# Patient Record
Sex: Female | Born: 1950
Health system: Southern US, Community
[De-identification: ages and names within clinical notes are randomized; demographics above are authoritative.]

## PROBLEM LIST (undated history)

## (undated) DIAGNOSIS — E079 Disorder of thyroid, unspecified: Secondary | ICD-10-CM

## (undated) DIAGNOSIS — I10 Essential (primary) hypertension: Secondary | ICD-10-CM

## (undated) DIAGNOSIS — R0600 Dyspnea, unspecified: Secondary | ICD-10-CM

## (undated) DIAGNOSIS — G5602 Carpal tunnel syndrome, left upper limb: Secondary | ICD-10-CM

## (undated) DIAGNOSIS — J40 Bronchitis, not specified as acute or chronic: Secondary | ICD-10-CM

## (undated) DIAGNOSIS — E785 Hyperlipidemia, unspecified: Secondary | ICD-10-CM

## (undated) DIAGNOSIS — IMO0002 Reserved for concepts with insufficient information to code with codable children: Secondary | ICD-10-CM

## (undated) DIAGNOSIS — D649 Anemia, unspecified: Secondary | ICD-10-CM

## (undated) DIAGNOSIS — J45909 Unspecified asthma, uncomplicated: Secondary | ICD-10-CM

## (undated) DIAGNOSIS — R0609 Other forms of dyspnea: Secondary | ICD-10-CM

## (undated) DIAGNOSIS — E119 Type 2 diabetes mellitus without complications: Secondary | ICD-10-CM

## (undated) DIAGNOSIS — R51 Headache: Secondary | ICD-10-CM

## (undated) DIAGNOSIS — N390 Urinary tract infection, site not specified: Secondary | ICD-10-CM

## (undated) DIAGNOSIS — F32A Depression, unspecified: Secondary | ICD-10-CM

## (undated) DIAGNOSIS — R519 Headache, unspecified: Secondary | ICD-10-CM

## (undated) DIAGNOSIS — R1110 Vomiting, unspecified: Secondary | ICD-10-CM

## (undated) DIAGNOSIS — Z9889 Other specified postprocedural states: Secondary | ICD-10-CM

## (undated) DIAGNOSIS — J189 Pneumonia, unspecified organism: Secondary | ICD-10-CM

## (undated) DIAGNOSIS — R112 Nausea with vomiting, unspecified: Secondary | ICD-10-CM

## (undated) DIAGNOSIS — I209 Angina pectoris, unspecified: Secondary | ICD-10-CM

## (undated) DIAGNOSIS — M1990 Unspecified osteoarthritis, unspecified site: Secondary | ICD-10-CM

## (undated) DIAGNOSIS — F329 Major depressive disorder, single episode, unspecified: Secondary | ICD-10-CM

## (undated) HISTORY — PX: ESOPHAGOGASTRODUODENOSCOPY: SHX1529

## (undated) HISTORY — DX: Major depressive disorder, single episode, unspecified: F32.9

## (undated) HISTORY — PX: TOTAL ELBOW REPLACEMENT: SUR1214

## (undated) HISTORY — DX: Essential (primary) hypertension: I10

## (undated) HISTORY — DX: Disorder of thyroid, unspecified: E07.9

## (undated) HISTORY — DX: Depression, unspecified: F32.A

## (undated) HISTORY — DX: Unspecified osteoarthritis, unspecified site: M19.90

---
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
## 1999-08-25 ENCOUNTER — Emergency Department (HOSPITAL_COMMUNITY): Admission: EM | Admit: 1999-08-25 | Discharge: 1999-08-25 | Payer: Self-pay | Admitting: Emergency Medicine

## 2000-11-15 ENCOUNTER — Emergency Department (HOSPITAL_COMMUNITY): Admission: EM | Admit: 2000-11-15 | Discharge: 2000-11-15 | Payer: Self-pay | Admitting: Emergency Medicine

## 2000-11-15 ENCOUNTER — Encounter: Payer: Self-pay | Admitting: Emergency Medicine

## 2001-12-16 ENCOUNTER — Encounter: Payer: Self-pay | Admitting: Emergency Medicine

## 2001-12-16 ENCOUNTER — Emergency Department (HOSPITAL_COMMUNITY): Admission: EM | Admit: 2001-12-16 | Discharge: 2001-12-16 | Payer: Self-pay | Admitting: Emergency Medicine

## 2004-07-16 ENCOUNTER — Emergency Department (HOSPITAL_COMMUNITY): Admission: EM | Admit: 2004-07-16 | Discharge: 2004-07-16 | Payer: Self-pay | Admitting: Emergency Medicine

## 2004-11-14 ENCOUNTER — Emergency Department (HOSPITAL_COMMUNITY): Admission: EM | Admit: 2004-11-14 | Discharge: 2004-11-14 | Payer: Self-pay | Admitting: Emergency Medicine

## 2004-11-22 ENCOUNTER — Inpatient Hospital Stay (HOSPITAL_COMMUNITY): Admission: RE | Admit: 2004-11-22 | Discharge: 2004-11-23 | Payer: Self-pay | Admitting: Orthopedic Surgery

## 2007-09-08 ENCOUNTER — Emergency Department (HOSPITAL_COMMUNITY): Admission: EM | Admit: 2007-09-08 | Discharge: 2007-09-08 | Payer: Self-pay | Admitting: Emergency Medicine

## 2008-02-02 ENCOUNTER — Emergency Department (HOSPITAL_COMMUNITY): Admission: EM | Admit: 2008-02-02 | Discharge: 2008-02-02 | Payer: Self-pay | Admitting: Emergency Medicine

## 2008-02-09 ENCOUNTER — Emergency Department (HOSPITAL_COMMUNITY): Admission: EM | Admit: 2008-02-09 | Discharge: 2008-02-09 | Payer: Self-pay | Admitting: Emergency Medicine

## 2008-02-20 ENCOUNTER — Emergency Department (HOSPITAL_COMMUNITY): Admission: EM | Admit: 2008-02-20 | Discharge: 2008-02-20 | Payer: Self-pay | Admitting: Emergency Medicine

## 2010-09-18 ENCOUNTER — Ambulatory Visit: Payer: Self-pay | Admitting: Internal Medicine

## 2010-09-18 DIAGNOSIS — I10 Essential (primary) hypertension: Secondary | ICD-10-CM | POA: Insufficient documentation

## 2010-09-18 DIAGNOSIS — R059 Cough, unspecified: Secondary | ICD-10-CM | POA: Insufficient documentation

## 2010-09-18 DIAGNOSIS — R0602 Shortness of breath: Secondary | ICD-10-CM | POA: Insufficient documentation

## 2010-09-18 DIAGNOSIS — R05 Cough: Secondary | ICD-10-CM

## 2010-10-15 ENCOUNTER — Emergency Department (HOSPITAL_COMMUNITY)
Admission: EM | Admit: 2010-10-15 | Discharge: 2010-10-15 | Payer: Self-pay | Source: Home / Self Care | Admitting: Emergency Medicine

## 2010-11-07 ENCOUNTER — Telehealth: Payer: Self-pay | Admitting: Internal Medicine

## 2010-11-12 NOTE — Assessment & Plan Note (Signed)
Summary: Pulmonary consulation/ cough ? all ace > d/c   Visit Type:  Initial Consult Copy to:  Dr. Jacinto Reap Primary Provider/Referring Provider:  none  CC:  Cough.  History of Present Illness: 29 yowf quit smoking  06/2010 with onset of cough.  September 18, 2010  1st pulmonary office eval on ACEI  cc cough x 3 months indolent onset, persistent and tickle in throat but no excess mucus worse after lie down x wake up using.  no better on inhalers including or prednisone. cough on insp.  no overt hb or sinus c/o's.  only sob when coughing.  Pt denies any significant sore throat, dysphagia, itching, sneezing,  nasal congestion or excess secretions,  fever, chills, sweats, unintended wt loss, pleuritic or exertional cp, hempoptysis, change in activity tolerance  orthopnea pnd or leg swelling Pt also denies any obvious fluctuation in symptoms with weather or environmental change or other alleviating or aggravating factors.       Current Medications (verified): 1)  Spiriva Handihaler 18 Mcg Caps (Tiotropium Bromide Monohydrate) .... Inhale Contents of 1 Capsule Daily 2)  Proair Hfa 108 (90 Base) Mcg/act Aers (Albuterol Sulfate) .... 2 Puffs Every 4-6 Hrs As Needed 3)  Vitamin D 400 Unit Caps (Cholecalciferol) .Marland Kitchen.. 1 Once Daily 4)  Vitamin B-12 1000 Mcg Tabs (Cyanocobalamin) .Marland Kitchen.. 1 Once Daily 5)  St Johns Wort 300 Mg Caps ([address] Johns Wort) .Marland Kitchen.. 1 Two Times A Day 6)  Lisinopril 5 Mg Tabs (Lisinopril) .Marland Kitchen.. 1 Once Daily  Allergies (verified): No Known Drug Allergies  Past History:  Past Medical History: Hypertension     - ACE  d/c September 18, 2010  due cough Cough..............................................................Marland KitchenWert  Past Surgical History: Cholecystectomy Hysterectomy 1980 Tubal ligation 1980  Family History: DM- Both Parents Negative for respiratory diseases or atopy   Social History: Married Chief of Staff Former smoker.  Quit in Sept 2011. Smoked 1/2 ppd x  40 yrs. No ETOH  Review of Systems       The patient complains of shortness of breath with activity, non-productive cough, loss of appetite, headaches, and anxiety.  The patient denies shortness of breath at rest, productive cough, coughing up blood, chest pain, irregular heartbeats, acid heartburn, indigestion, weight change, abdominal pain, difficulty swallowing, sore throat, tooth/dental problems, nasal congestion/difficulty breathing through nose, sneezing, itching, ear ache, depression, hand/feet swelling, joint stiffness or pain, rash, change in color of mucus, and fever.    Vital Signs:  Patient profile:   60 year old female Height:      64 inches Weight:      234.25 pounds BMI:     40.35 O2 Sat:      94 % on Room air Temp:     97.9 degrees F oral Pulse rate:   79 / minute BP sitting:   112 / 84  (left arm) Cuff size:   large  Vitals Entered By: Vernie Murders (September 18, 2010 1:59 PM)  O2 Flow:  Room air  Physical Exam  Additional Exam:  obese anxious wf nad wt 234 September 18, 2010 Voice fatigue, cough on early insp. HEENT: nl dentition, turbinates, and orophanx. Nl external ear canals without cough reflex NECK :  without JVD/Nodes/TM/ nl carotid upstrokes bilaterally LUNGS: no acc muscle use, clear to A and P bilaterally   CV:  RRR  no s3 or murmur or increase in P2, no edema  HYQ:MVHQI but   soft and nontender with nl excursion in the  supine position. No bruits or organomegaly, bowel sounds nl MS:  warm without deformities, calf tenderness, cyanosis or clubbing SKIN: warm and dry without lesions   NEURO:  alert, approp, no deficits, nl gait     CXR  Procedure date:  09/18/2010  Findings:      Findings: Cardiopericardial silhouette is within normal limits. Lungs are clear.  No airspace disease.  No effusion.   IMPRESSION: No active cardiopulmonary disease.  Impression & Recommendations:  Problem # 1:  COUGH (ICD-786.2) The most common causes of chronic  cough in immunocompetent adults include: upper airway cough syndrome (UACS), previously referred to as postnasal drip syndrome,  caused by variety of rhinosinus conditions; (2) asthma; (3) GERD; (4) chronic bronchitis from cigarette smoking or other inhaled environmental irritants; (5) nonasthmatic eosinophilic bronchitis; and (6) bronchiectasis. These conditions, singly or in combination, have accounted for up to 94% of the causes of chronic cough in prospective studies.  this is most c/w  Classic Upper airway cough syndrome, so named because it's frequently impossible to sort out how much is  CR/sinusitis with freq throat clearing (which can be related to primary GERD)   vs  causing  secondary extra esophageal GERD from wide swings in gastric pressure that occur with throat clearing, promoting self use of mint and menthol lozenges that reduce the lower esophageal sphincter tone and exacerbate the problem further These are the same pts who not infrequently have failed to tolerate ace inhibitors,  dry powder inhalers or biphosphonates or report having reflux symptoms that don't respond to standard doses of PPI  She is on ace so try off these first and diet for gerd then regroup in office 6 weeks with pfts  Problem # 2:  HYPERTENSION (ICD-401.9)  The following medications were removed from the medication list:    Lisinopril 5 Mg Tabs (Lisinopril) .Marland Kitchen... 1 once daily Her updated medication list for this problem includes:    Micardis 80 Mg Tabs (Telmisartan) ..... One half daily    ACE inhibitors are problematic in  pts with airway complaints because  even experienced pulmonologists can't always distinguish ace effects from copd/asthma.  By themselves they don't actually cause a problem, much like oxygen can't by itself start a fire, but they certainly serve as a powerful catalyst or enhancer for any "fire"  or inflammatory process in the upper airway, be it caused by an ET  tube or more commonly reflux  (especially in the obese or pts with known GERD or who are on biphoshonates).  In the era of ARB near equivalency until we have a better handle on the reversibility of the airway problem, it just makes sense to avoid ace entirely in the short run and then decide later, having established a level of airway control using a reasonable limited regimen, whether to add back ace but even then being very careful to observe the pt for worsening airway control and number of meds used/ needed to control symptoms.    Medications Added to Medication List This Visit: 1)  Spiriva Handihaler 18 Mcg Caps (Tiotropium bromide monohydrate) .... Inhale contents of 1 capsule daily 2)  Proair Hfa 108 (90 Base) Mcg/act Aers (Albuterol sulfate) .... 2 puffs every 4-6 hrs as needed 3)  Vitamin D 400 Unit Caps (Cholecalciferol) .Marland Kitchen.. 1 once daily 4)  Vitamin B-12 1000 Mcg Tabs (Cyanocobalamin) .Marland Kitchen.. 1 once daily 5)  St Johns Wort 300 Mg Caps ([address] johns wort) .Marland Kitchen.. 1 two times a day 6)  Lisinopril  5 Mg Tabs (Lisinopril) .Marland Kitchen.. 1 once daily 7)  Micardis 80 Mg Tabs (Telmisartan) .... One half daily  Other Orders: Consultation Level V (99245) T-2 View CXR (71020TC)  Patient Instructions: 1)  stop lisnopril 2)  start micardis 80 one half daily 3)  Please schedule a follow-up appointment in 6 weeks, sooner if needed with pft's 4)  GERD (REFLUX)  is a common cause of respiratory symptoms. It commonly presents without heartburn and can be treated with medication, but also with lifestyle changes including avoidance of late meals, excessive alcohol, smoking cessation, and avoid fatty foods, chocolate, peppermint, colas, red wine, and acidic juices such as orange juice. NO MINT OR MENTHOL PRODUCTS SO NO COUGH DROPS  5)  USE SUGARLESS CANDY INSTEAD (jolley ranchers)  6)  NO OIL BASED VITAMINS

## 2010-11-14 NOTE — Progress Notes (Signed)
Summary: micardis samples  Phone Note Call from Patient Call back at Home Phone [phone]   Caller: Patient Call For: Jerry Clyne Summary of Call: pt took her last micardis tab. needs enough samples until rov w/ Martin Smeal 12/03/10.  Initial call taken by: Tivis Ringer, CNA,  November 07, 2010 3:41 PM  Follow-up for Phone Call        samples left at front desk. pt aware. Zackery Barefoot CMA  November 07, 2010 5:07 PM

## 2010-11-15 ENCOUNTER — Ambulatory Visit: Admit: 2010-11-15 | Payer: Self-pay | Admitting: Internal Medicine

## 2010-11-15 ENCOUNTER — Ambulatory Visit: Payer: Self-pay | Admitting: Internal Medicine

## 2010-12-03 ENCOUNTER — Encounter: Payer: Self-pay | Admitting: Internal Medicine

## 2010-12-03 ENCOUNTER — Encounter (INDEPENDENT_AMBULATORY_CARE_PROVIDER_SITE_OTHER): Payer: BC Managed Care – PPO

## 2010-12-03 ENCOUNTER — Ambulatory Visit: Payer: Self-pay | Admitting: Internal Medicine

## 2010-12-03 ENCOUNTER — Telehealth (INDEPENDENT_AMBULATORY_CARE_PROVIDER_SITE_OTHER): Payer: Self-pay | Admitting: *Deleted

## 2010-12-03 DIAGNOSIS — R0602 Shortness of breath: Secondary | ICD-10-CM

## 2010-12-03 DIAGNOSIS — R059 Cough, unspecified: Secondary | ICD-10-CM

## 2010-12-03 DIAGNOSIS — R05 Cough: Secondary | ICD-10-CM

## 2010-12-05 ENCOUNTER — Ambulatory Visit (INDEPENDENT_AMBULATORY_CARE_PROVIDER_SITE_OTHER): Payer: BC Managed Care – PPO | Admitting: Internal Medicine

## 2010-12-05 ENCOUNTER — Encounter: Payer: Self-pay | Admitting: Internal Medicine

## 2010-12-05 DIAGNOSIS — R635 Abnormal weight gain: Secondary | ICD-10-CM

## 2010-12-05 DIAGNOSIS — R0602 Shortness of breath: Secondary | ICD-10-CM

## 2010-12-05 DIAGNOSIS — R05 Cough: Secondary | ICD-10-CM

## 2010-12-05 DIAGNOSIS — I10 Essential (primary) hypertension: Secondary | ICD-10-CM

## 2010-12-05 DIAGNOSIS — R059 Cough, unspecified: Secondary | ICD-10-CM

## 2010-12-06 ENCOUNTER — Telehealth (INDEPENDENT_AMBULATORY_CARE_PROVIDER_SITE_OTHER): Payer: Self-pay | Admitting: *Deleted

## 2010-12-10 NOTE — Assessment & Plan Note (Signed)
Summary: PFT's wnl x erv 39%    Allergies: No Known Drug Allergies  Past History:  Past Medical History: Hypertension     - ACE  d/c September 18, 2010  due cough Cough..............................................................Marland KitchenWert      - PFT's December 03, 2010  FEV1 2.30 (96%) with ratio 83 and nl DLCO  ERV 39%   Other Orders: Carbon Monoxide diffusing w/capacity (65784) Lung Volumes/Gas dilution or washout (69629) Spirometry (Pre & Post) (52841) No Charge Patient Arrived (NCPA0) (NCPA0)  Appended Document: PFT's wnl x erv 39%  let her know pft's wnl but needs ov if not doing 100% better  Appended Document: PFT's wnl x erv 39%  lmomtcb  Appended Document: PFT's wnl x erv 39%  MW will discuss at ov today

## 2010-12-10 NOTE — Progress Notes (Signed)
Summary: needs refill pt coming in today  Phone Note Call from Patient Call back at [facility] Phone [phone]   Caller: Patient Call For: wert Reason for Call: Refill Medication Summary of Call: Patient has a PFT at 11:00a today then to see dr wert. We had to reschedule dr werts apt due to a family emergency. she will have to call thursday and make another apt with wert. (when she gets her schedule for work) she is out of micardis. she needs a couple pills to last her until she can come in to see him again.  Initial call taken by: Valinda Hoar,  December 03, 2010 8:40 AM  Follow-up for Phone Call        Pt coming at 11:00 today for PFT.  Sample of Micardis left in PFT room for pt to pick up.   Pt aware. Abigail Miyamoto RN  December 03, 2010 9:21 AM

## 2010-12-10 NOTE — Assessment & Plan Note (Signed)
Summary: Pulmonary/ final summary f/u ov   Copy to:  Dr. Jacinto Reap Primary Provider/Referring Provider:  none  CC:  Dyspnea- some improvement.  History of Present Illness: 57 yowf quit smoking  06/2010 with onset of cough.  September 18, 2010  1st pulmonary office eval on ACEI  cc cough x 3 months indolent onset, persistent and tickle in throat but no excess mucus worse after lie down x wake up using.  no better on inhalers including or prednisone. cough on insp.  no overt hb or sinus c/o's.  only sob when coughing. stop lisnopril start micardis 80 one half daily Please schedule a follow-up appointment in 6 weeks, sooner if needed with pft's GERD (REFLUX)  diet    December 05, 2010  ov cough resolved off ace, not really limited by sob but more by knee.   Pt denies any significant sore throat, dysphagia, itching, sneezing,  nasal congestion or excess secretions,  fever, chills, sweats, unintended wt loss, pleuritic or exertional cp, hempoptysis, change in activity tolerance  orthopnea pnd or leg swelling Pt also denies any obvious fluctuation in symptoms with weather or environmental change or other alleviating or aggravating factors.          Current Medications (verified): 1)  Spiriva Handihaler 18 Mcg Caps (Tiotropium Bromide Monohydrate) .... Inhale Contents of 1 Capsule Daily Only Taking As Needed 2)  Proair Hfa 108 (90 Base) Mcg/act Aers (Albuterol Sulfate) .... 2 Puffs Every 4-6 Hrs As Needed 3)  Vitamin D 400 Unit Caps (Cholecalciferol) .Marland Kitchen.. 1 Once Daily 4)  Vitamin B-12 1000 Mcg Tabs (Cyanocobalamin) .Marland Kitchen.. 1 Once Daily 5)  Micardis 80 Mg  Tabs (Telmisartan) .... One Half Daily  Allergies (verified): No Known Drug Allergies  Past History:  Past Medical History: Hypertension     - ACE  d/c September 18, 2010  due to cough > resolved Cough..............................................................Marland KitchenWert      - PFT's December 03, 2010  FEV1 2.30 (96%) with ratio 83 and nl  DLCO  ERV 39%  Vital Signs:  Patient profile:   60 year old female Weight:      234 pounds O2 Sat:      96 % on Room air Temp:     97.4 degrees F oral Pulse rate:   96 / minute BP sitting:   152 / 94  (right arm) Cuff size:   large  Vitals Entered By: Vernie Murders (December 05, 2010 4:36 PM)  O2 Flow:  Room air  Clinical Reports Reviewed:  CXR:  09/18/2010: CXR Results:  Findings: Cardiopericardial silhouette is within normal limits. Lungs are clear.  No airspace disease.  No effusion.   IMPRESSION: No active cardiopulmonary disease.   Physical Exam  Additional Exam:  obese wf having trouble standing and onto exam table due to wt and knee, not  sob wt 234 September 18, 2010 > 234 December 05, 2010  Voice fatigue, cough on early insp. HEENT: nl dentition, turbinates, and orophanx. Nl external ear canals without cough reflex NECK :  without JVD/Nodes/TM/ nl carotid upstrokes bilaterally LUNGS: no acc muscle use, clear to A and P bilaterally   CV:  RRR  no s3 or murmur or increase in P2, no edema  RJJ:OACZY but   soft and nontender with nl excursion in the supine position. No bruits or organomegaly, bowel sounds nl MS:  warm without deformities, calf tenderness, cyanosis or clubbing       Impression & Recommendations:  Problem #  1:  COUGH (ICD-786.2)  Resolved off ace.  ACE inhibitors were  probably fueling this problem because they inhibit the metabolism of Upper Air Bradykinin,  a potent mediator of upper airways inflammation. If they are the "fuel" then something else usually is the "spark" (eg viral uri, post nasal drainage, or reflux). In the era of ARB equivalency, at least in the short run, I recommend the ACE be stopped for a six week trial basis. If the problem resolves, the ACE could still be restarted depending on the severity of the severity of the original problem - in her case she does not want to rechallenge and I'll give her a year's supply of  micardis  Orders: Est. Patient Level IV (16109)  Problem # 2:  HYPERTENSION (ICD-401.9)  not optimal on micardis 40 mg > double dose and f/u by primary Her updated medication list for this problem includes:    Micardis 80 Mg Tabs (Telmisartan) ..... One daily  Orders: Est. Patient Level IV (60454)  Problem # 3:  DYSPNEA (ICD-786.05)  By pft's she has no sign copd and the reduction in ERV is classic for obesity.  No role for spiriva here  Orders: Est. Patient Level IV (09811)  Problem # 4:  WEIGHT GAIN, ABNORMAL (ICD-783.1)  Weight control is a matter of calorie balance which needs to be tilted in the pt's favor by eating less and exercising more.  Specifically, I recommended  exercise at a level where pt  is short of breath but not out of breath 30 minutes daily.  If not losing weight on this program, I would strongly recommend pt see a nutritionist with a food diary recorded for two weeks prior to the visit.     Water aerobics strongly recommended in setting of knee limitations  Orders: Est. Patient Level IV (91478)  Medications Added to Medication List This Visit: 1)  Spiriva Handihaler 18 Mcg Caps (Tiotropium bromide monohydrate) .... Inhale contents of 1 capsule daily only taking as needed 2)  Micardis 80 Mg Tabs (Telmisartan) .... One daily  Patient Instructions: 1)  stop spiriva 2)  increase micardis 80 mg one daily 3)  your lung function is relatively good and will not worsen if you don't  smoke but does the effect of weight on your diaphragm  4)  Copy sent to: [facility] Prescriptions: MICARDIS 80 MG  TABS (TELMISARTAN) one daily  #34 x 11   Entered and Authorized by:   Nyoka Cowden MD   Signed by:   Nyoka Cowden MD on 12/05/2010   Method used:   Electronically to        [facility] [id number] ([facility])       [address]       [place], [place]  [address]       Ph: [phone] or [phone]       Fax: [phone]   RxID:    [id number]

## 2010-12-18 ENCOUNTER — Encounter: Payer: Self-pay | Admitting: Internal Medicine

## 2010-12-19 NOTE — Progress Notes (Addendum)
Summary: Micardis PA initiated---APPROVED  Phone Note Outgoing Call   Call placed by: Michel Bickers CMA,  December 06, 2010 11:50 AM Call placed to: [facility] @ [phone] Summary of Call: PA initiated for Micardis. Per insurance pt must have already tried a generic ACE inhibitor and failed. Pt was previously on Lisinoptil and MW changed this to Micardis due to the pt's cough.  MEMber ID# is [id number].  Awaiting form. Initial call taken by: Michel Bickers CMA,  December 06, 2010 11:54 AM  Follow-up for Phone Call        Form received and placed in MW look at to sign.Michel Bickers [facility]  December 06, 2010 12:03 PM  Form was completed and faxed back to [facility], then placed in triage under awaiting approvals Vernie Murders  December 09, 2010 12:13 PM   Additional Follow-up for Phone Call Additional follow up Details #1::        Rx was approved form 12/10/10 until 10/12/38- sent fax to [facility] [address] to let them know. Additional Follow-up by: Vernie Murders,  December 12, 2010 5:01 PM

## 2010-12-24 NOTE — Medication Information (Signed)
Summary: Tax adviser   Imported By: Valinda Hoar 12/18/2010 13:30:27  _____________________________________________________________________  External Attachment:    Type:   Image     Comment:   External Document

## 2011-02-28 NOTE — Op Note (Signed)
NAMESHERRONDA, SWEIGERT             ACCOUNT NO.:  [id number]   MEDICAL RECORD NO.:  [id number]          PATIENT TYPE:  AMB   LOCATION:  DAY                          FACILITY:  [facility]   PHYSICIAN:  Vania Rea. Supple, M.D.  DATE OF BIRTH:  [birth year]   DATE OF PROCEDURE:  11/21/2004  DATE OF DISCHARGE:                                 OPERATIVE REPORT   PREOPERATIVE DIAGNOSIS:  Complex comminuted displaced right radial head  fracture.   POSTOPERATIVE DIAGNOSES:  Complex comminuted displaced right radial head  fracture.   PROCEDURE:  Radial head excision and prosthetic replacement of the radial  head utilizing a size 2 stem and a 22 short head from the [facility]  orthopedic radial head implant system.   SURGEON OF RECORD:  Vania Rea. Supple, M.D.   Threasa HeadsFrench Ana A. Shuford, P.A.-C.   ANESTHESIA:  General endotracheal.   TOURNIQUET TIME:  37 minutes.   ESTIMATED BLOOD LOSS:  Minimal.   DRAINS:  None.   HISTORY:  Ms. Douglass is a 60 year old female who fell onto the outstretched  right upper extremity last week, with immediate complaints of pain and loss  of elbow motion.  Initial radiographs in the emergency room showed a radial  head fracture.  She was placed into a sling and on followup in the office  was found to have markedly restricted and painful motion of the elbow, with  radiographs reviewed showing complete displacement of the majority of the  radial head into a posterolateral position.  She was found initially to be  grossly neurovascularly intact.  She is subsequently brought to the  operating room at this time for planned repair versus excision and  prosthetic replacement of the radial head.   Preoperative, I had discussed with Ms. Dittman treatment options as well as  risks versus benefits thereof.  Possible surgical complications of bleeding,  infection, neurovascular injury, loss of reduction, and possible need for  additional surgery were reviewed.  She  understands  and accepts and agrees  to the planned procedure.   PROCEDURE IN DETAIL:  After undergoing routine preoperative evaluation, the  patient received prophylactic antibiotics.  She was placed supine on the  operating table and underwent smooth induction of general endotracheal  anesthesia.  Tourniquet applied to the right upper arm.  Right upper  extremity was then sterilely prepped and draped in the standard fashion.  Arm was exsanguinated, with the tourniquet inflated to 250 mmHg.  We made a  lateral Kocher approach to the right elbow through a 6 cm longitudinal  incision overlying the lateral aspect of the elbow.  Skin flaps were  mobilized.  I identified the anconeus and ECU and made a division in this  interval.  We did split the fibers of the anconeus to appropriately  visualize the radiocapitellar joint, as well as the displaced radial head  fragment which was posterolateral in the elbow.  The radial head fragment  had completely extruded from the radiocapitellar joint and was essentially  beneath the anconeus.  This fragment was removed in its entirety, and it  constituted approximately 80% of  the radial head.  It was taken to the back  table and measured and had a very close approximation with a 22 mm head.  We  then divided the lateral capsule to gain access to the radiocapitellar joint  and carried this division distally to the level of the annular ligaments so  that we could expose the proximal radius.  Retractors were placed anterior  and posterior about the radial neck in a subperiosteal fashion.  We used the  sizing guide to resect the radial neck at the appropriate level for a  standard implant.  This was performed with an oscillating saw.  We then  removed additional bone fragments from the joint.  The joint was copiously  irrigated.  There was noted to be some chondromalacia on the capitellum.  Canal finder was passed into the radial canal, and then we used a  size 1  broach which was well seated and somewhat loose, and so we proceeded with a  size 2 broach, and this had an excellent fill of the proximal radius.  A  trial reduction was performed with the standard 22 mm head, and this did  show good soft tissue balance, so we selected the size 2 broach with the 22  mm head.  The wound was copiously irrigated, and all residual bony debris  was removed.  The size 2 stem was then impacted down into the radial shaft  and obtained an excellent fit.  The 22 mm head was then tamped over the stem  and impacted into position.  The radial head was then reduced.  The elbow  was taken through a range of motion, showing excellent stability and full  motion.  At this point, intraoperative radiographs were obtained which  confirmed congruent alignment of the radiocapitellar joint and showed the  implant to be in good position.  At this time, the wound was then finally  irrigated.  The tourniquet was let down.  Hemostasis was obtained.  We  closed the capsule with interrupted figure-of-eight 0 Vicryl sutures.  2-0  Vicryl was used to close the subcutaneous, and intracuticular 3-0 Monocryl  was used to close the skin, followed by Steri-Strips.  A bulky dry dressing  was applied.  The patient was then extubated and taken to the recovery room  in stable condition.      KMS/MEDQ  D:  11/21/2004  T:  11/21/2004  Job:  [id number]

## 2011-06-10 ENCOUNTER — Emergency Department (HOSPITAL_COMMUNITY)
Admission: EM | Admit: 2011-06-10 | Discharge: 2011-06-10 | Disposition: A | Discharge status: Home / Self Care | Payer: BC Managed Care – PPO | Attending: Emergency Medicine | Admitting: Emergency Medicine

## 2011-06-10 ENCOUNTER — Emergency Department (HOSPITAL_COMMUNITY): Payer: BC Managed Care – PPO

## 2011-06-10 DIAGNOSIS — R3589 Other polyuria: Secondary | ICD-10-CM | POA: Insufficient documentation

## 2011-06-10 DIAGNOSIS — R631 Polydipsia: Secondary | ICD-10-CM | POA: Insufficient documentation

## 2011-06-10 DIAGNOSIS — I10 Essential (primary) hypertension: Secondary | ICD-10-CM | POA: Insufficient documentation

## 2011-06-10 DIAGNOSIS — Z79899 Other long term (current) drug therapy: Secondary | ICD-10-CM | POA: Insufficient documentation

## 2011-06-10 DIAGNOSIS — J4489 Other specified chronic obstructive pulmonary disease: Secondary | ICD-10-CM | POA: Insufficient documentation

## 2011-06-10 DIAGNOSIS — M79609 Pain in unspecified limb: Secondary | ICD-10-CM | POA: Insufficient documentation

## 2011-06-10 DIAGNOSIS — J449 Chronic obstructive pulmonary disease, unspecified: Secondary | ICD-10-CM | POA: Insufficient documentation

## 2011-06-10 DIAGNOSIS — R632 Polyphagia: Secondary | ICD-10-CM | POA: Insufficient documentation

## 2011-06-10 DIAGNOSIS — R7309 Other abnormal glucose: Secondary | ICD-10-CM | POA: Insufficient documentation

## 2011-06-10 DIAGNOSIS — R358 Other polyuria: Secondary | ICD-10-CM | POA: Insufficient documentation

## 2011-06-10 LAB — COMPREHENSIVE METABOLIC PANEL
ALT: 35 U/L (ref 0–35)
AST: 46 U/L — ABNORMAL HIGH (ref 0–37)
Albumin: 3.7 g/dL (ref 3.5–5.2)
Alkaline Phosphatase: 116 U/L (ref 39–117)
BUN: 15 mg/dL (ref 6–23)
CO2: 27 mEq/L (ref 19–32)
Calcium: 9.4 mg/dL (ref 8.4–10.5)
Chloride: 105 mEq/L (ref 96–112)
Creatinine, Ser: 0.48 mg/dL — ABNORMAL LOW (ref 0.50–1.10)
GFR calc Af Amer: >60 mL/min (ref >60)
GFR calc non Af Amer: >60 mL/min (ref >60)
Glucose, Bld: 296 mg/dL — ABNORMAL HIGH (ref 70–99)
Potassium: 4.1 mEq/L (ref 3.5–5.1)
Sodium: 139 mEq/L (ref 135–145)
Total Bilirubin: 0.5 mg/dL (ref 0.3–1.2)
Total Protein: 6.7 g/dL (ref 6.0–8.3)

## 2011-06-10 LAB — DIFFERENTIAL
Basophils Absolute: 0 10*3/uL (ref 0.0–0.1)
Basophils Relative: 0 % (ref 0–1)
Eosinophils Absolute: 0.2 10*3/uL (ref 0.0–0.7)
Eosinophils Relative: 2 % (ref 0–5)
Lymphocytes Relative: 42 % (ref 12–46)
Lymphs Abs: 3.2 10*3/uL (ref 0.7–4.0)
Monocytes Absolute: 0.5 10*3/uL (ref 0.1–1.0)
Monocytes Relative: 6 % (ref 3–12)
Neutro Abs: 3.7 10*3/uL (ref 1.7–7.7)
Neutrophils Relative %: 49 % (ref 43–77)

## 2011-06-10 LAB — URINALYSIS, ROUTINE W REFLEX MICROSCOPIC
Bilirubin Urine: NEGATIVE
Glucose, UA: >1000 mg/dL — AB
Hgb urine dipstick: NEGATIVE
Ketones, ur: 15 mg/dL — AB
Nitrite: NEGATIVE
Protein, ur: NEGATIVE mg/dL
Specific Gravity, Urine: 1.02 (ref 1.005–1.030)
Urobilinogen, UA: 0.2 mg/dL (ref 0.0–1.0)
pH: 5.5 (ref 5.0–8.0)

## 2011-06-10 LAB — CBC
HCT: 44.7 % (ref 36.0–46.0)
Hemoglobin: 15.5 g/dL — ABNORMAL HIGH (ref 12.0–15.0)
MCH: 32 pg (ref 26.0–34.0)
MCHC: 34.7 g/dL (ref 30.0–36.0)
MCV: 92.2 fL (ref 78.0–100.0)
Platelets: 157 10*3/uL (ref 150–400)
RBC: 4.85 MIL/uL (ref 3.87–5.11)
RDW: 12.5 % (ref 11.5–15.5)
WBC: 7.6 10*3/uL (ref 4.0–10.5)

## 2011-06-10 LAB — GLUCOSE, CAPILLARY: Glucose-Capillary: 276 mg/dL — ABNORMAL HIGH (ref 70–99)

## 2011-06-10 LAB — URINE MICROSCOPIC-ADD ON

## 2011-06-19 ENCOUNTER — Encounter: Payer: Self-pay | Admitting: Internal Medicine

## 2011-06-19 ENCOUNTER — Ambulatory Visit (INDEPENDENT_AMBULATORY_CARE_PROVIDER_SITE_OTHER): Payer: BC Managed Care – PPO | Admitting: Internal Medicine

## 2011-06-19 DIAGNOSIS — M1711 Unilateral primary osteoarthritis, right knee: Secondary | ICD-10-CM

## 2011-06-19 DIAGNOSIS — M1712 Unilateral primary osteoarthritis, left knee: Secondary | ICD-10-CM | POA: Insufficient documentation

## 2011-06-19 DIAGNOSIS — R635 Abnormal weight gain: Secondary | ICD-10-CM

## 2011-06-19 DIAGNOSIS — E119 Type 2 diabetes mellitus without complications: Secondary | ICD-10-CM

## 2011-06-19 DIAGNOSIS — I10 Essential (primary) hypertension: Secondary | ICD-10-CM

## 2011-06-19 DIAGNOSIS — IMO0002 Reserved for concepts with insufficient information to code with codable children: Secondary | ICD-10-CM

## 2011-06-19 DIAGNOSIS — M1710 Unilateral primary osteoarthritis, unspecified knee: Secondary | ICD-10-CM

## 2011-06-19 MED ORDER — GLYBURIDE 5 MG PO TABS: 5.0000 mg | ORAL_TABLET | Freq: Every day | ORAL | Status: DC

## 2011-06-19 MED ORDER — METFORMIN HCL 1000 MG PO TABS: 1000.0000 mg | ORAL_TABLET | Freq: Two times a day (BID) | ORAL | Status: DC

## 2011-06-19 NOTE — Patient Instructions (Signed)
Return at your convenience next week for a fasting lipid panel and hemoglobin A1c Return in one month for follow-up  You need to lose weight.  Consider a lower calorie diet and regular exercise.  Limit your sodium (Salt) intake

## 2011-06-19 NOTE — Progress Notes (Signed)
Subjective:    Patient ID: Abigail Collins, female    DOB: Jan 22, 1951, 60 y.o.   MRN: [id number]  HPI  60 year old patient who is seen today to establish with our practice. She has a prior history of hypertension presently not requiring treatment. Her husband has diabetes and a home glucometer and she has noted her blood sugars are increasing over the past several weeks. She went to the ED 2 weeks ago and now is on metformin therapy. A random blood sugar today 171. She has a history of exogenous obesity Other medical problems include osteoarthritis especially affecting the right knee it is then recommended that she have total knee replacement surgery. 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 Quit in Sept 2011. Smoked 1/2 ppd x 40 yrs.  No ETOH    Review of Systems  Constitutional: Positive for unexpected weight change. Negative for fever, appetite change and fatigue.  HENT: Negative for hearing loss, ear pain, nosebleeds, congestion, sore throat, mouth sores, trouble swallowing, neck stiffness, dental problem, voice change, sinus pressure and tinnitus.        Mild blurred vision  Eyes: Negative for photophobia, pain, redness and visual disturbance.  Respiratory: Negative for cough, chest tightness and shortness of breath.   Cardiovascular: Negative for chest pain, palpitations and leg swelling.  Gastrointestinal:  Negative for nausea, vomiting, abdominal pain, diarrhea, constipation, blood in stool, abdominal distention and rectal pain.  Genitourinary: Negative for dysuria, urgency, frequency, hematuria, flank pain, vaginal bleeding, vaginal discharge, difficulty urinating, genital sores, vaginal pain, menstrual problem and pelvic pain.  Musculoskeletal: Positive for joint swelling and gait problem. Negative for back pain and arthralgias.  Skin: Negative for rash.  Neurological: Negative for dizziness, syncope, speech difficulty, weakness, light-headedness, numbness and headaches.  Hematological: Negative for adenopathy. Does not bruise/bleed easily.  Psychiatric/Behavioral: Negative for suicidal ideas, behavioral problems, self-injury, dysphoric mood and agitation. The patient is not nervous/anxious.        Objective:   Physical Exam  Constitutional: She is oriented to person, place, and time. She appears well-developed and well-nourished.       Obese. Blood pressure 110/80. Blood pressure by my exam 140/82  HENT:  Head: Normocephalic and atraumatic.  Right Ear: External ear normal.  Left Ear: External ear normal.  Mouth/Throat: Oropharynx is clear and moist.  Eyes: Conjunctivae and EOM are normal.  Neck: Normal range of motion. Neck supple. No JVD present. No thyromegaly present.  Cardiovascular: Normal rate, regular rhythm, normal heart sounds and intact distal pulses.   No murmur heard. Pulmonary/Chest: Effort normal and breath sounds normal. She has no wheezes. She has no rales.  Abdominal: Soft. Bowel sounds are normal. She exhibits no distension and no mass. There is no tenderness. There is no rebound and no guarding.  Genitourinary: Vagina normal.  Musculoskeletal: Normal range of motion. She exhibits no edema and no tenderness.       Prominent lower extremity varicosities right greater than the  left  Neurological: She is alert and oriented to person, place, and time. She has normal  reflexes. No cranial nerve deficit. She exhibits normal muscle tone. Coordination normal.  Skin: Skin is warm and dry. No rash noted.  Psychiatric: She has a normal mood and affect. Her behavior is normal.          Assessment & Plan:   Diabetes mellitus. The patient will be given considerable information concerning self treatment of diabetes. Her metformin will be increased to 1000 mg twice daily;  she does have a prescription for glyburide which she was encouraged to take once daily. Lifestyle issues discussed at length History of hypertension. We'll continue to observe off medication at this time. History of ACE associated cough Exogenous obesity Osteo arthritis right knee

## 2011-06-23 ENCOUNTER — Telehealth: Payer: Self-pay | Admitting: Internal Medicine

## 2011-06-23 NOTE — Telephone Encounter (Signed)
yes

## 2011-06-23 NOTE — Telephone Encounter (Signed)
Pt was seen on 9-6 as new pt. Pt would like to have total chole/triglycerides and glucose test. Can  I sch?

## 2011-06-24 NOTE — Telephone Encounter (Signed)
Pt is sch for fasting labs on 06-27-2011

## 2011-06-27 ENCOUNTER — Other Ambulatory Visit (INDEPENDENT_AMBULATORY_CARE_PROVIDER_SITE_OTHER): Payer: BC Managed Care – PPO

## 2011-06-27 DIAGNOSIS — E785 Hyperlipidemia, unspecified: Secondary | ICD-10-CM

## 2011-06-27 DIAGNOSIS — R7309 Other abnormal glucose: Secondary | ICD-10-CM

## 2011-06-27 LAB — POCT CBG (FASTING - GLUCOSE)-MANUAL ENTRY: Glucose Fasting, POC: 148 mg/dL

## 2011-06-27 LAB — LIPID PANEL
Cholesterol: 148 mg/dL (ref 0–200)
HDL: 57.6 mg/dL (ref >39.00)
LDL Cholesterol: 64 mg/dL (ref 0–99)
Total CHOL/HDL Ratio: 3
Triglycerides: 131 mg/dL (ref 0.0–149.0)
VLDL: 26.2 mg/dL (ref 0.0–40.0)

## 2011-07-18 ENCOUNTER — Ambulatory Visit: Payer: BC Managed Care – PPO | Admitting: Internal Medicine

## 2011-07-28 ENCOUNTER — Ambulatory Visit: Payer: BC Managed Care – PPO | Admitting: Internal Medicine

## 2011-08-21 ENCOUNTER — Ambulatory Visit: Payer: BC Managed Care – PPO | Admitting: Internal Medicine

## 2012-02-27 ENCOUNTER — Ambulatory Visit: Payer: BC Managed Care – PPO | Admitting: Internal Medicine

## 2012-03-13 DIAGNOSIS — J189 Pneumonia, unspecified organism: Secondary | ICD-10-CM

## 2012-03-13 HISTORY — DX: Pneumonia, unspecified organism: J18.9

## 2012-04-05 ENCOUNTER — Emergency Department (HOSPITAL_COMMUNITY): Payer: BC Managed Care – PPO

## 2012-04-05 ENCOUNTER — Observation Stay (HOSPITAL_COMMUNITY)
Admission: EM | Admit: 2012-04-05 | Discharge: 2012-04-07 | Disposition: A | Discharge status: Home / Self Care | Payer: BC Managed Care – PPO | Attending: Internal Medicine | Admitting: Internal Medicine

## 2012-04-05 ENCOUNTER — Observation Stay (HOSPITAL_COMMUNITY): Payer: BC Managed Care – PPO

## 2012-04-05 ENCOUNTER — Encounter (HOSPITAL_COMMUNITY): Payer: Self-pay | Admitting: *Deleted

## 2012-04-05 DIAGNOSIS — I10 Essential (primary) hypertension: Secondary | ICD-10-CM | POA: Diagnosis present

## 2012-04-05 DIAGNOSIS — I200 Unstable angina: Secondary | ICD-10-CM

## 2012-04-05 DIAGNOSIS — E1165 Type 2 diabetes mellitus with hyperglycemia: Secondary | ICD-10-CM | POA: Diagnosis present

## 2012-04-05 DIAGNOSIS — R5383 Other fatigue: Secondary | ICD-10-CM | POA: Insufficient documentation

## 2012-04-05 DIAGNOSIS — Z23 Encounter for immunization: Secondary | ICD-10-CM | POA: Insufficient documentation

## 2012-04-05 DIAGNOSIS — K219 Gastro-esophageal reflux disease without esophagitis: Secondary | ICD-10-CM | POA: Diagnosis present

## 2012-04-05 DIAGNOSIS — R0602 Shortness of breath: Secondary | ICD-10-CM | POA: Insufficient documentation

## 2012-04-05 DIAGNOSIS — R079 Chest pain, unspecified: Secondary | ICD-10-CM

## 2012-04-05 DIAGNOSIS — G8929 Other chronic pain: Secondary | ICD-10-CM | POA: Insufficient documentation

## 2012-04-05 DIAGNOSIS — M25519 Pain in unspecified shoulder: Secondary | ICD-10-CM | POA: Insufficient documentation

## 2012-04-05 DIAGNOSIS — M79609 Pain in unspecified limb: Secondary | ICD-10-CM | POA: Insufficient documentation

## 2012-04-05 DIAGNOSIS — E785 Hyperlipidemia, unspecified: Secondary | ICD-10-CM | POA: Insufficient documentation

## 2012-04-05 DIAGNOSIS — R61 Generalized hyperhidrosis: Secondary | ICD-10-CM | POA: Insufficient documentation

## 2012-04-05 DIAGNOSIS — R110 Nausea: Secondary | ICD-10-CM | POA: Insufficient documentation

## 2012-04-05 DIAGNOSIS — R5381 Other malaise: Secondary | ICD-10-CM | POA: Insufficient documentation

## 2012-04-05 DIAGNOSIS — E1149 Type 2 diabetes mellitus with other diabetic neurological complication: Secondary | ICD-10-CM | POA: Diagnosis present

## 2012-04-05 DIAGNOSIS — IMO0001 Reserved for inherently not codable concepts without codable children: Secondary | ICD-10-CM

## 2012-04-05 DIAGNOSIS — M542 Cervicalgia: Secondary | ICD-10-CM | POA: Insufficient documentation

## 2012-04-05 DIAGNOSIS — E119 Type 2 diabetes mellitus without complications: Secondary | ICD-10-CM | POA: Insufficient documentation

## 2012-04-05 DIAGNOSIS — IMO0002 Reserved for concepts with insufficient information to code with codable children: Secondary | ICD-10-CM | POA: Diagnosis present

## 2012-04-05 HISTORY — DX: Carpal tunnel syndrome, left upper limb: G56.02

## 2012-04-05 HISTORY — DX: Other specified postprocedural states: Z98.890

## 2012-04-05 HISTORY — DX: Reserved for concepts with insufficient information to code with codable children: IMO0002

## 2012-04-05 HISTORY — DX: Nausea with vomiting, unspecified: R11.2

## 2012-04-05 LAB — URINALYSIS, ROUTINE W REFLEX MICROSCOPIC
Bilirubin Urine: NEGATIVE
Glucose, UA: 500 mg/dL — AB
Hgb urine dipstick: NEGATIVE
Ketones, ur: NEGATIVE mg/dL
Leukocytes, UA: NEGATIVE
Nitrite: NEGATIVE
Protein, ur: NEGATIVE mg/dL
Specific Gravity, Urine: 1.025 (ref 1.005–1.030)
Urobilinogen, UA: 0.2 mg/dL (ref 0.0–1.0)
pH: 5 (ref 5.0–8.0)

## 2012-04-05 LAB — CBC
HCT: 45.2 % (ref 36.0–46.0)
Hemoglobin: 15.3 g/dL — ABNORMAL HIGH (ref 12.0–15.0)
MCH: 31.4 pg (ref 26.0–34.0)
MCHC: 33.8 g/dL (ref 30.0–36.0)
MCV: 92.6 fL (ref 78.0–100.0)
Platelets: 216 10*3/uL (ref 150–400)
RBC: 4.88 MIL/uL (ref 3.87–5.11)
RDW: 13 % (ref 11.5–15.5)
WBC: 8.9 10*3/uL (ref 4.0–10.5)

## 2012-04-05 LAB — BASIC METABOLIC PANEL
BUN: 22 mg/dL (ref 6–23)
CO2: 22 mEq/L (ref 19–32)
Calcium: 9.8 mg/dL (ref 8.4–10.5)
Chloride: 99 mEq/L (ref 96–112)
Creatinine, Ser: 0.62 mg/dL (ref 0.50–1.10)
GFR calc Af Amer: >90 mL/min (ref >90)
GFR calc non Af Amer: >90 mL/min (ref >90)
Glucose, Bld: 218 mg/dL — ABNORMAL HIGH (ref 70–99)
Potassium: 4 mEq/L (ref 3.5–5.1)
Sodium: 135 mEq/L (ref 135–145)

## 2012-04-05 LAB — GLUCOSE, CAPILLARY: Glucose-Capillary: 289 mg/dL — ABNORMAL HIGH (ref 70–99)

## 2012-04-05 LAB — POCT I-STAT TROPONIN I
Troponin i, poc: 0.01 ng/mL (ref 0.00–0.08)
Troponin i, poc: 0 ng/mL (ref 0.00–0.08)

## 2012-04-05 LAB — TSH: TSH: 3.158 u[IU]/mL (ref 0.350–4.500)

## 2012-04-05 LAB — D-DIMER, QUANTITATIVE (NOT AT ARMC): D-Dimer, Quant: 0.29 ug/mL-FEU (ref 0.00–0.48)

## 2012-04-05 LAB — MAGNESIUM: Magnesium: 2 mg/dL (ref 1.5–2.5)

## 2012-04-05 LAB — CARDIAC PANEL(CRET KIN+CKTOT+MB+TROPI)
CK, MB: 5 ng/mL — ABNORMAL HIGH (ref 0.3–4.0)
Relative Index: 2.6 — ABNORMAL HIGH (ref 0.0–2.5)
Total CK: 194 U/L — ABNORMAL HIGH (ref 7–177)
Troponin I: <0.3 ng/mL (ref <0.30)

## 2012-04-05 LAB — PHOSPHORUS: Phosphorus: 3.7 mg/dL (ref 2.3–4.6)

## 2012-04-05 MED ORDER — ALUM & MAG HYDROXIDE-SIMETH 200-200-20 MG/5ML PO SUSP: 30.0000 mL | Freq: Four times a day (QID) | ORAL | Status: DC | PRN

## 2012-04-05 MED ORDER — GLYBURIDE 5 MG PO TABS
5.0000 mg | ORAL_TABLET | Freq: Every day | ORAL | Status: DC
  Administered 2012-04-06 – 2012-04-07 (×2): 5 mg via ORAL
  Filled 2012-04-05 (×3): qty 1

## 2012-04-05 MED ORDER — NITROGLYCERIN 0.4 MG SL SUBL: 0.4000 mg | SUBLINGUAL_TABLET | SUBLINGUAL | Status: DC | PRN

## 2012-04-05 MED ORDER — ONDANSETRON HCL 4 MG PO TABS: 4.0000 mg | ORAL_TABLET | Freq: Four times a day (QID) | ORAL | Status: DC | PRN

## 2012-04-05 MED ORDER — SODIUM CHLORIDE 0.9 % IV SOLN
INTRAVENOUS | Status: DC
  Administered 2012-04-06: 75 mL/h via INTRAVENOUS
  Administered 2012-04-06 – 2012-04-07 (×2): via INTRAVENOUS

## 2012-04-05 MED ORDER — MORPHINE SULFATE 2 MG/ML IJ SOLN: 2.0000 mg | INTRAMUSCULAR | Status: DC | PRN

## 2012-04-05 MED ORDER — METOPROLOL TARTRATE 12.5 MG HALF TABLET
12.5000 mg | ORAL_TABLET | Freq: Two times a day (BID) | ORAL | Status: DC
  Administered 2012-04-05 – 2012-04-06 (×3): 12.5 mg via ORAL
  Filled 2012-04-05 (×5): qty 1

## 2012-04-05 MED ORDER — ZOLPIDEM TARTRATE 5 MG PO TABS: 5.0000 mg | ORAL_TABLET | Freq: Every evening | ORAL | Status: DC | PRN

## 2012-04-05 MED ORDER — ONDANSETRON HCL 4 MG/2ML IJ SOLN: 4.0000 mg | Freq: Four times a day (QID) | INTRAMUSCULAR | Status: DC | PRN

## 2012-04-05 MED ORDER — SIMVASTATIN 20 MG PO TABS
20.0000 mg | ORAL_TABLET | Freq: Every day | ORAL | Status: DC
  Administered 2012-04-05 – 2012-04-06 (×2): 20 mg via ORAL
  Filled 2012-04-05 (×3): qty 1

## 2012-04-05 MED ORDER — INSULIN ASPART 100 UNIT/ML ~~LOC~~ SOLN
0.0000 [IU] | Freq: Three times a day (TID) | SUBCUTANEOUS | Status: DC
  Administered 2012-04-06: 2 [IU] via SUBCUTANEOUS
  Administered 2012-04-06: 5 [IU] via SUBCUTANEOUS
  Administered 2012-04-06 – 2012-04-07 (×2): 3 [IU] via SUBCUTANEOUS

## 2012-04-05 MED ORDER — HYDROCODONE-ACETAMINOPHEN 5-325 MG PO TABS: 1.0000 | ORAL_TABLET | ORAL | Status: DC | PRN

## 2012-04-05 MED ORDER — MORPHINE SULFATE 4 MG/ML IJ SOLN
4.0000 mg | Freq: Once | INTRAMUSCULAR | Status: DC
  Filled 2012-04-05: qty 1

## 2012-04-05 MED ORDER — PANTOPRAZOLE SODIUM 40 MG PO TBEC
40.0000 mg | DELAYED_RELEASE_TABLET | Freq: Every day | ORAL | Status: DC
  Administered 2012-04-05 – 2012-04-06 (×2): 40 mg via ORAL
  Filled 2012-04-05 (×3): qty 1

## 2012-04-05 MED ORDER — NITROGLYCERIN 2 % TD OINT
1.0000 [in_us] | TOPICAL_OINTMENT | Freq: Four times a day (QID) | TRANSDERMAL | Status: DC
  Administered 2012-04-05: 1 [in_us] via TOPICAL
  Filled 2012-04-05: qty 30

## 2012-04-05 MED ORDER — ACETAMINOPHEN 650 MG RE SUPP: 650.0000 mg | Freq: Four times a day (QID) | RECTAL | Status: DC | PRN

## 2012-04-05 MED ORDER — NITROGLYCERIN 0.4 MG SL SUBL
0.4000 mg | SUBLINGUAL_TABLET | SUBLINGUAL | Status: AC | PRN
  Administered 2012-04-05 (×3): 0.4 mg via SUBLINGUAL
  Filled 2012-04-05: qty 25

## 2012-04-05 MED ORDER — SENNA 8.6 MG PO TABS
1.0000 | ORAL_TABLET | Freq: Two times a day (BID) | ORAL | Status: DC
  Administered 2012-04-06 (×2): 8.6 mg via ORAL
  Filled 2012-04-05 (×2): qty 1

## 2012-04-05 MED ORDER — ASPIRIN 81 MG PO CHEW
324.0000 mg | CHEWABLE_TABLET | Freq: Once | ORAL | Status: AC
  Administered 2012-04-05: 324 mg via ORAL
  Filled 2012-04-05: qty 4

## 2012-04-05 MED ORDER — INSULIN GLARGINE 100 UNIT/ML ~~LOC~~ SOLN
5.0000 [IU] | Freq: Every day | SUBCUTANEOUS | Status: DC
  Administered 2012-04-05 – 2012-04-06 (×2): 5 [IU] via SUBCUTANEOUS

## 2012-04-05 MED ORDER — ENOXAPARIN SODIUM 120 MG/0.8ML ~~LOC~~ SOLN
1.0000 mg/kg | Freq: Two times a day (BID) | SUBCUTANEOUS | Status: DC
  Administered 2012-04-05 – 2012-04-06 (×2): 105 mg via SUBCUTANEOUS
  Filled 2012-04-05 (×4): qty 0.8

## 2012-04-05 MED ORDER — ASPIRIN EC 325 MG PO TBEC
325.0000 mg | DELAYED_RELEASE_TABLET | Freq: Every day | ORAL | Status: DC
  Administered 2012-04-05 – 2012-04-06 (×2): 325 mg via ORAL
  Filled 2012-04-05 (×3): qty 1

## 2012-04-05 MED ORDER — ALBUTEROL SULFATE HFA 108 (90 BASE) MCG/ACT IN AERS: 2.0000 | INHALATION_SPRAY | RESPIRATORY_TRACT | Status: DC | PRN

## 2012-04-05 MED ORDER — ACETAMINOPHEN 325 MG PO TABS
650.0000 mg | ORAL_TABLET | Freq: Four times a day (QID) | ORAL | Status: DC | PRN
  Administered 2012-04-05 – 2012-04-06 (×3): 650 mg via ORAL
  Filled 2012-04-05 (×3): qty 2

## 2012-04-05 MED ORDER — PNEUMOCOCCAL VAC POLYVALENT 25 MCG/0.5ML IJ INJ
0.5000 mL | INJECTION | INTRAMUSCULAR | Status: AC
  Administered 2012-04-06: 0.5 mL via INTRAMUSCULAR
  Filled 2012-04-05: qty 0.5

## 2012-04-05 NOTE — ED Provider Notes (Signed)
History     CSN: [id number]  Arrival date & time 04/05/12  1137   First MD Initiated Contact with Patient 04/05/12 1214      Chief Complaint  Patient presents with  . Chest Pain  . Arm Pain    (Consider location/radiation/quality/duration/timing/severity/associated sxs/prior treatment) HPI Patient presents to the ED following 3-4 days of pain that originates in her left elbow and runs up into her neck.  She reports that this is happening at least 20 times a day and that each episode lasts for several minutes.  She says that nothing really makes it better but that she has to sit down when it happens because she feels so short of breath.  She does not see any pattern to these episodes and they can wake her from sleep.  These episodes are associated with diaphoresis, nausea, flushing, and chest tightness. She denies history of trauma or fall onto the arm and the pain is not reproducible.   The patient reports increasing shortness of breath over the last several months. No cough, fever, chills, night sweats. The patient has 3-pillow orthopnea.   The patient has been told previously that her EKG shows a probable distant MI but she has never followed up with a cardiologist. She quit smoking 2 years ago after 20 yrs of 1 pack/week smoking history.  She denies history of HTN or hypercholesteremia.   Past Medical History  Diagnosis Date  . Arthritis   . Depression   . Diabetes mellitus   . Headache   . Hypertension   . Thyroid disease     Past Surgical History  Procedure Date  . Abdominal hysterectomy   . Cholecystectomy   . Elbow surgery     right    Family History  Problem Relation Age of Onset  . Arthritis Mother   . Arthritis Father   . Hypertension Father   . Diabetes Father     History  Substance Use Topics  . Smoking status: Former Smoker -- 0.2 packs/day for 20 years    Quit date: 10/13/2009  . Smokeless tobacco: Never Used  . Alcohol Use: No    OB History    Grav Para Term Preterm Abortions TAB SAB Ect Mult Living                  Review of Systems  Allergies  Lisinopril  Home Medications   Current Outpatient Rx  Name Route Sig Dispense Refill  . GLYBURIDE 5 MG PO TABS Oral Take 1 tablet (5 mg total) by mouth daily with breakfast. 30 tablet 11  . METFORMIN HCL 1000 MG PO TABS Oral Take 1 tablet (1,000 mg total) by mouth 2 (two) times daily with a meal. 60 tablet 11    BP 130/81  Pulse 102  Temp 97.4 F (36.3 C) (Oral)  Resp 15  SpO2 100%  Physical Exam  Nursing note and vitals reviewed. Constitutional: She appears well-developed and well-nourished. No distress.       Morbidly obese, tearful  HENT:  Head: Normocephalic and atraumatic.  Right Ear: External ear normal.  Left Ear: External ear normal.  Eyes: Conjunctivae are normal. Right eye exhibits no discharge. Left eye exhibits no discharge. No scleral icterus.  Neck: Neck supple. No tracheal deviation present.  Cardiovascular: Regular rhythm, normal heart sounds and intact distal pulses.  Tachycardia present.   Pulmonary/Chest: Breath sounds normal. No stridor. Tachypnea noted. No respiratory distress. She has no wheezes. She has no rales. She  exhibits no tenderness.       Good air movement in the bases, bilaterally  Abdominal: Soft. Normal appearance and bowel sounds are normal. She exhibits no distension. There is no tenderness. There is no rebound and no guarding.  Musculoskeletal: She exhibits no edema and no tenderness.       Movement of the arm does not reproduce her pain, no neck tenderness  Neurological: She is alert. She has normal strength. No sensory deficit. Cranial nerve deficit:  no gross defecits noted. She exhibits normal muscle tone. She displays no seizure activity. Coordination normal.  Skin: Skin is warm and dry. No rash noted.  Psychiatric: She has a normal mood and affect.    ED Course  Procedures (including critical care time)  Rate: 101  Rhythm:  sinus tachycardia  QRS Axis: normal  Intervals: normal  ST/T Wave abnormalities: normal  Conduction Disutrbances:none  Narrative Interpretation:   Old EKG Reviewed: none available Labs Reviewed  CBC - Abnormal; Notable for the following:    Hemoglobin 15.3 (*)     All other components within normal limits  BASIC METABOLIC PANEL - Abnormal; Notable for the following:    Glucose, Bld 218 (*)     All other components within normal limits  POCT I-STAT TROPONIN I  POCT I-STAT TROPONIN I  D-DIMER, QUANTITATIVE   Dg Chest Portable 1 View  04/05/2012  *RADIOLOGY REPORT*  Clinical Data: Chest and left arm pain, history hypertension, diabetes  PORTABLE CHEST - 1 VIEW  Comparison: Portable exam 1323 hours compared to 10/15/2010  Findings: Upper-normal size of cardiac silhouette. Mediastinal contours and pulmonary vascularity normal. Lungs grossly clear for technique. Chronic peribronchial thickening noted. No pleural effusion or pneumothorax. No acute osseous findings.  IMPRESSION: Chronic bronchitic changes.  Original Report Authenticated By: Lollie Marrow, M.D.     1. Chest pain       MDM  The patient's symptoms are concerning for the possibility of an anginal equivalent. Some the symptoms are atypical however her history of diabetes, hypertension and remote smoking history consider high-risk. I discussed these findings with the patient and she'll be admitted to the hospital for further evaluation. Patient denies any leg swelling or recent travel however I will add a d-dimer considering her tachycardia.        Celene Kras, MD 04/05/12 [phone]

## 2012-04-05 NOTE — ED Notes (Addendum)
Per ems pt was at work. Pt reports left arm pain that radiates to wrist x 3 days. Reports radiates sometimes up to left side of neck, sharp pain, intermittent, at present denies pain. When pain occurs 5/10. Reports occasional "chest pressure". In past doctor has wanted pt to complete stress test, pt has never followed up to do stress test. Reports nausea in the mornings. Sob and "feeling tired" x 1 month. Pt has hx of smoking 0.25 packs x 20 years, denies COPD. Pain increases with movement. Pt has hx of carpal tunnel in left wrist.

## 2012-04-05 NOTE — ED Notes (Signed)
Xray came in and took chest xray. Pt did have to move around. Pt reports pain increased from 4/10 to 8/10 in left shoulder/ arm area

## 2012-04-05 NOTE — ED Notes (Signed)
ZOX:[id number]<AV> Expected date:04/05/12<BR> Expected time:11:39 AM<BR> Means of arrival:Ambulance<BR> Comments:<BR> Arm pain

## 2012-04-05 NOTE — H&P (Signed)
PCP:   Rogelia Boga, MD   Chief Complaint:  Left shoulder pain for 4 days.  HPI: Abigail Collins comes in with left shoulder pain on and off for the last 4 days, which sometimes radiates to the neck and to the arm. She says that she has also had shortness of breath with exertion, and she has to stop vacuuming at times to catch her breath. This has been going on for a few months. She says she stops whatever activity she would be doing and starts feeling better. The episodes are associated with pressure in the chest, and occasionally diaphoresis. She denies fever or cough. In ED, she felt some relief after nitroglycerin and morphine. She has been referred to pulmonary previously and was told that there was nothing wrong with her lungs. At that point, her symptoms were felt to be related to lisinopril. Abigail Collins had also been referred for stress test but she has not had it done yet. Currently she is chest pain free. Her EKG showed sinus tachycardia, with no significant st-t wave changes, and cxr showed chronic bronchitic changes.  Review of Systems:  Unremarkable except as highlighted in the hpi.  Past Medical History: Past Medical History  Diagnosis Date  . Arthritis   . Depression   . Diabetes mellitus   . Headache   . Hypertension   . Thyroid disease   . Knee pain     right - to have knee replacement  . Carpal tunnel syndrome of left wrist   . Bulging disc     "lower"  . PONV (postoperative nausea and vomiting)    Past Surgical History  Procedure Date  . Abdominal hysterectomy   . Cholecystectomy   . Elbow surgery     right    Medications: Prior to Admission medications   Medication Sig Start Date End Date Taking? Authorizing Provider  glyBURIDE (DIABETA) 5 MG tablet Take 1 tablet (5 mg total) by mouth daily with breakfast. 06/19/11 06/18/12 Yes Gordy Savers, MD  metFORMIN (GLUCOPHAGE) 1000 MG tablet Take 1 tablet (1,000 mg total) by mouth 2 (two) times daily with  a meal. 06/19/11 06/18/12 Yes Gordy Savers, MD    Allergies:   Allergies  Allergen Reactions  . Lisinopril Shortness Of Breath    Social History:  reports that she quit smoking about 2 years ago. She has never used smokeless tobacco. She reports that she does not drink alcohol or use illicit drugs.  Family History: Family History  Problem Relation Age of Onset  . Arthritis Mother   . Arthritis Father   . Hypertension Father   . Diabetes Father     Physical Exam: Filed Vitals:   04/05/12 1220 04/05/12 1330 04/05/12 1548 04/05/12 1653  BP:  121/81 148/89 125/85  Pulse:      Temp:      TempSrc:      Resp:  17 19 19   SpO2: 100% 92% 96% 97%   Lying comfortably in bed. PERRLA. No jvd. No carotid bruits. Lungs clear. S1S2 heard. No murmurs. RRR. Abdomen- soft, non tender. +BS. No palpable organomegaly. CNS- grossly Intact. Extremities- some tenderness anterior aspect of left shoulder. No pedal edema. Peripheral pulses equal.   Labs on Admission:   [facility] 04/05/12 1257  NA 135  K 4.0  CL 99  CO2 22  GLUCOSE 218*  BUN 22  CREATININE 0.62  CALCIUM 9.8  MG --  PHOS --   No results found for this basename:  AST:2,ALT:2,ALKPHOS:2,BILITOT:2,PROT:2,ALBUMIN:2 in the last 72 hours No results found for this basename: LIPASE:2,AMYLASE:2 in the last 72 hours  Basename 04/05/12 1220  WBC 8.9  NEUTROABS --  HGB 15.3*  HCT 45.2  MCV 92.6  PLT 216   No results found for this basename: CKTOTAL:3,CKMB:3,CKMBINDEX:3,TROPONINI:3 in the last 72 hours No results found for this basename: TSH,T4TOTAL,FREET3,T3FREE,THYROIDAB in the last 72 hours No results found for this basename: VITAMINB12:2,FOLATE:2,FERRITIN:2,TIBC:2,IRON:2,RETICCTPCT:2 in the last 72 hours  Radiological Exams on Admission: Dg Chest Portable 1 View  04/05/2012  *RADIOLOGY REPORT*  Clinical Data: Chest and left arm pain, history hypertension, diabetes  PORTABLE CHEST - 1 VIEW  Comparison: Portable exam 1323  hours compared to 10/15/2010  Findings: Upper-normal size of cardiac silhouette. Mediastinal contours and pulmonary vascularity normal. Lungs grossly clear for technique. Chronic peribronchial thickening noted. No pleural effusion or pneumothorax. No acute osseous findings.  IMPRESSION: Chronic bronchitic changes.  Original Report Authenticated By: Lollie Marrow, M.D.    Assessment  Unstable angina, and possibly arthritic pain left shoulder versus other problems, in middle aged diabetic female, raising concern for an ischemic event,  Plan  .Unstable angina- admit tele. ROMI protocol. 2decho,lipids panel, tsh, cardiology consult in am for further risk stratification. Meanwhile ntg/morphine/O2/lopressor/zocor/asa/lovenox. .Diabetes mellitus type 2 in obese- hold metformin as she may need cardiac cath. Low dose lantus/ssi, hba1c. Marland KitchenHTN (hypertension), benign- klow dose beta blocker. Marland KitchenGERD (gastroesophageal reflux disease)- add ppi .Morbid obesity- life style changes Xray left shoulder, assess for arthritis. Condition guarded Discussed plan of care with daughter at bed side.  Abigail Collins [phone]. 04/05/2012, 5:44 PM

## 2012-04-05 NOTE — ED Notes (Signed)
At present pt does not want pain medication. Denies pain

## 2012-04-05 NOTE — ED Notes (Addendum)
Lab called to say that Bmet was hemolyzed.  Will reorder.

## 2012-04-05 NOTE — ED Notes (Signed)
Pt going to xray and then being transported upstairs.

## 2012-04-05 NOTE — ED Notes (Signed)
First attempt to call report to michelle, rn on floor. rn will call back

## 2012-04-06 DIAGNOSIS — IMO0001 Reserved for inherently not codable concepts without codable children: Secondary | ICD-10-CM

## 2012-04-06 DIAGNOSIS — I200 Unstable angina: Secondary | ICD-10-CM

## 2012-04-06 DIAGNOSIS — E1165 Type 2 diabetes mellitus with hyperglycemia: Secondary | ICD-10-CM

## 2012-04-06 DIAGNOSIS — I10 Essential (primary) hypertension: Secondary | ICD-10-CM

## 2012-04-06 LAB — HEMOGLOBIN A1C
Hgb A1c MFr Bld: 8.8 % — ABNORMAL HIGH (ref <5.7)
Mean Plasma Glucose: 206 mg/dL — ABNORMAL HIGH (ref <117)

## 2012-04-06 LAB — COMPREHENSIVE METABOLIC PANEL
ALT: 24 U/L (ref 0–35)
AST: 28 U/L (ref 0–37)
Albumin: 3.6 g/dL (ref 3.5–5.2)
Alkaline Phosphatase: 86 U/L (ref 39–117)
BUN: 19 mg/dL (ref 6–23)
CO2: 24 mEq/L (ref 19–32)
Calcium: 9.1 mg/dL (ref 8.4–10.5)
Chloride: 98 mEq/L (ref 96–112)
Creatinine, Ser: 0.72 mg/dL (ref 0.50–1.10)
GFR calc Af Amer: >90 mL/min (ref >90)
GFR calc non Af Amer: >90 mL/min (ref >90)
Glucose, Bld: 237 mg/dL — ABNORMAL HIGH (ref 70–99)
Potassium: 3.9 mEq/L (ref 3.5–5.1)
Sodium: 134 mEq/L — ABNORMAL LOW (ref 135–145)
Total Bilirubin: 0.4 mg/dL (ref 0.3–1.2)
Total Protein: 6.7 g/dL (ref 6.0–8.3)

## 2012-04-06 LAB — CBC
HCT: 44.6 % (ref 36.0–46.0)
Hemoglobin: 14.7 g/dL (ref 12.0–15.0)
MCH: 31.1 pg (ref 26.0–34.0)
MCHC: 33 g/dL (ref 30.0–36.0)
MCV: 94.3 fL (ref 78.0–100.0)
Platelets: 177 10*3/uL (ref 150–400)
RBC: 4.73 MIL/uL (ref 3.87–5.11)
RDW: 12.8 % (ref 11.5–15.5)
WBC: 9.3 10*3/uL (ref 4.0–10.5)

## 2012-04-06 LAB — LIPID PANEL
Cholesterol: 166 mg/dL (ref 0–200)
HDL: 49 mg/dL (ref >39)
LDL Cholesterol: UNDETERMINED mg/dL (ref 0–99)
Total CHOL/HDL Ratio: 3.4 RATIO
Triglycerides: 413 mg/dL — ABNORMAL HIGH (ref <150)
VLDL: UNDETERMINED mg/dL (ref 0–40)

## 2012-04-06 LAB — CARDIAC PANEL(CRET KIN+CKTOT+MB+TROPI)
CK, MB: 5 ng/mL — ABNORMAL HIGH (ref 0.3–4.0)
CK, MB: 4.8 ng/mL — ABNORMAL HIGH (ref 0.3–4.0)
Relative Index: 2.5 (ref 0.0–2.5)
Relative Index: 2.5 (ref 0.0–2.5)
Total CK: 202 U/L — ABNORMAL HIGH (ref 7–177)
Total CK: 189 U/L — ABNORMAL HIGH (ref 7–177)
Troponin I: <0.3 ng/mL (ref <0.30)
Troponin I: <0.3 ng/mL (ref <0.30)

## 2012-04-06 LAB — GLUCOSE, CAPILLARY
Glucose-Capillary: 277 mg/dL — ABNORMAL HIGH (ref 70–99)
Glucose-Capillary: 244 mg/dL — ABNORMAL HIGH (ref 70–99)
Glucose-Capillary: 154 mg/dL — ABNORMAL HIGH (ref 70–99)
Glucose-Capillary: 188 mg/dL — ABNORMAL HIGH (ref 70–99)

## 2012-04-06 LAB — PROTIME-INR
INR: 0.96 (ref 0.00–1.49)
Prothrombin Time: 13 seconds (ref 11.6–15.2)

## 2012-04-06 LAB — APTT: aPTT: 40 seconds — ABNORMAL HIGH (ref 24–37)

## 2012-04-06 LAB — PRO B NATRIURETIC PEPTIDE: Pro B Natriuretic peptide (BNP): 12.9 pg/mL (ref 0–125)

## 2012-04-06 MED ORDER — ENOXAPARIN SODIUM 40 MG/0.4ML ~~LOC~~ SOLN
40.0000 mg | SUBCUTANEOUS | Status: DC
  Filled 2012-04-06 (×2): qty 0.4

## 2012-04-06 NOTE — Progress Notes (Signed)
[facility] PROGRESS NOTE  Abigail Collins NWG:[id number] DOB: [birth year] DOA: 04/05/2012 PCP: Rogelia Boga, MD  Brief narrative:   61 yo female hx diabetes, obesity, HTN, former smoker presented 6/24 with 4 day hx left shoulder pain that sometimes radiates to neck and arm. Occasional sob with exertion. Concern for unstable angina, and possibly arthritic pain left shoulder versus other problems, in middle aged diabetic female, raising concern for an ischemic event.     Consultants:  Cardiology  Procedures:    Antibiotics:    HPI/Subjective: Awake, alert. Denies chest pain/sob but reports left shldr remain "sore" but better than before. NAD  Objective: Filed Vitals:   04/05/12 1653 04/05/12 1830 04/05/12 2147 04/06/12 0300  BP: 125/85 143/89 115/76 144/92  Pulse:  96 86 86  Temp:  97.8 F (36.6 C) 97.6 F (36.4 C) 97.5 F (36.4 C)  TempSrc:  Oral Oral Axillary  Resp: 19 22 20 19   Height:  5\' 4"  (1.626 m)    Weight:  106.7 kg (235 lb 3.7 oz)    SpO2: 97% 96% 95% 96%    Intake/Output Summary (Last 24 hours) at 04/06/12 0805 Last data filed at 04/06/12 0700  Gross per 24 hour  Intake    105 ml  Output    800 ml  Net   -695 ml    Exam:   General:  Awake, alert, oriented x3. Well nourished NAD  Cardiovascular: RRR, no MGR, no LEE, PPP  Respiratory: normal effort. Breath sounds slightly distant with faint crackles bilateral bases. Otherwise clear.   Abdomen: Obese, soft, non-tender, +BS.   Extremeties: MOE. Full ROM left shldr. Bicep/Tricep strength 5/5 bilaterally. Left shoulder tender to palpation. Neck full ROM  Data Reviewed: Basic Metabolic Panel:  Lab 04/06/12 5784 04/05/12 1840 04/05/12 1257  NA 134* -- 135  K 3.9 -- 4.0  CL 98 -- 99  CO2 24 -- 22  GLUCOSE 237* -- 218*  BUN 19 -- 22  CREATININE 0.72 -- 0.62  CALCIUM 9.1 -- 9.8  MG -- 2.0 --  PHOS -- 3.7 --   Liver Function Tests:  Lab 04/06/12 0140  AST 28  ALT 24    ALKPHOS 86  BILITOT 0.4  PROT 6.7  ALBUMIN 3.6   No results found for this basename: LIPASE:5,AMYLASE:5 in the last 168 hours No results found for this basename: AMMONIA:5 in the last 168 hours CBC:  Lab 04/06/12 0140 04/05/12 1220  WBC 9.3 8.9  NEUTROABS -- --  HGB 14.7 15.3*  HCT 44.6 45.2  MCV 94.3 92.6  PLT 177 216   Cardiac Enzymes:  Lab 04/06/12 0140 04/05/12 1840  CKTOTAL 202* 194*  CKMB 5.0* 5.0*  CKMBINDEX -- --  TROPONINI <0.30 <0.30   BNP: No components found with this basename: POCBNP:5 CBG:  Lab 04/05/12 2143  GLUCAP 289*    No results found for this or any previous visit (from the past 240 hour(s)).   Studies: Dg Chest Portable 1 View  04/05/2012  *RADIOLOGY REPORT*  Clinical Data: Chest and left arm pain, history hypertension, diabetes  PORTABLE CHEST - 1 VIEW  Comparison: Portable exam 1323 hours compared to 10/15/2010  Findings: Upper-normal size of cardiac silhouette. Mediastinal contours and pulmonary vascularity normal. Lungs grossly clear for technique. Chronic peribronchial thickening noted. No pleural effusion or pneumothorax. No acute osseous findings.  IMPRESSION: Chronic bronchitic changes.  Original Report Authenticated By: Lollie Marrow, M.D.   Dg Shoulder Left  04/05/2012  *RADIOLOGY REPORT*  Clinical Data: 61 year old female with diffuse left shoulder pain.  LEFT SHOULDER - 2+ VIEW  Comparison: Chest radiographs from the same day.  Findings: No glenohumeral joint dislocation.   Bone mineralization is within normal limits.  Proximal left humerus, left clavicle, and left scapula appear intact.  Visualized left ribs are within normal limits.  Increased pulmonary interstitial markings in the visible left lung.  IMPRESSION: No acute fracture or dislocation identified about the left shoulder.  Original Report Authenticated By: Harley Hallmark, M.D.    Scheduled Meds:   . aspirin  324 mg Oral Once  . aspirin EC  325 mg Oral Daily  . enoxaparin  (LOVENOX) injection  1 mg/kg Subcutaneous Q12H  . glyBURIDE  5 mg Oral Q breakfast  . insulin aspart  0-9 Units Subcutaneous TID WC  . insulin glargine  5 Units Subcutaneous QHS  . metoprolol tartrate  12.5 mg Oral BID  . pantoprazole  40 mg Oral Q1200  . pneumococcal 23 valent vaccine  0.5 mL Intramuscular Tomorrow-1000  . senna  1 tablet Oral BID  . simvastatin  20 mg Oral q1800  . DISCONTD:  morphine injection  4 mg Intravenous Once  . DISCONTD: nitroGLYCERIN  1 inch Topical Q6H   Continuous Infusions:   . sodium chloride 75 mL/hr at 04/06/12 0536     Assessment/Plan:  .Unstable angina-  ROMI protocol. 2decho pending. lipids panel with triglycerides 413, tsh 3.1  Tele with SR. CE with neg trop, elevated CK.  Will request cardiology consult. Continue ntg/morphine/O2/lopressor/zocor/asa/lovenox.  .Diabetes mellitus type 2 in obese- hold metformin as she may need cardiac cath.  A1c 8.8. Carb modified diet. Reports confusion/side effects from meds. Will request diabetes coordinator consult. Continue. low dose lantus/ssi, hba1c. CBG 289, 277.  Marland KitchenHTN (hypertension),only fair control- hx side effects ACE. Will continue low dose beta blocker. Monitor  .GERD (gastroesophageal reflux disease)- add ppi. Stable  .Morbid obesity- life style changes   Left shldr pain- see #1. Xray neg. OP follow up arthritis and/or flex/ext neck.   Code Status: full Family Communication: Patient at bedside  Disposition Plan: Pending card recommendations. Home when ready.    Gwenyth Bender, MD  [facility] Pager [phone]  If 7PM-7AM, please contact night-coverage [email or web address] Password [email or web address] 04/06/2012, 8:05 AM   LOS: 1 day  Appreciate cardiology. Discussed plan of care with Clydie Braun. Patient worried that she may not afford prescribed meds. If no change, d/c in am.  Arrie Borrelli,MD [phone]

## 2012-04-06 NOTE — Progress Notes (Signed)
*  PRELIMINARY RESULTS* Echocardiogram 2D Echocardiogram has been performed.  Abigail Collins 04/06/2012, 11:05 AM

## 2012-04-06 NOTE — Progress Notes (Signed)
Brief Nutrition Note  Reason: Nutrition risk for unintentional weight loss and decreased appetite.   Patient reported appetite and intake are good. PO intake documented 75% of meals on carb modified diet. She reported she checks her blood sugar regularly and knows what she should eat. She denies any unintentional weight loss or dysphagia. Patient was without any nutrition related questions or concerns. Patient not at nutrition risk risk at this time.   RD to monitor for nutrition needs.   Iven [facility] [phone]

## 2012-04-06 NOTE — Progress Notes (Signed)
Inpatient Diabetes Program Recommendations  AACE/ADA: New Consensus Statement on Inpatient Glycemic Control (2009)  Target Ranges:  Prepandial:   less than 140 mg/dL      Peak postprandial:   less than 180 mg/dL (1-2 hours)      Critically ill patients:  140 - 180 mg/dL   Reason for Visit: Referral received.  Patient has had diabetes for several years.  She takes metformin 1000 mg bid and Diabeta 5 mg daily at home.  Her A1C=8.8%.  I asked patient about her CBG's at home.  She admits that her blood sugars have been higher recently. She usually tests in the evenings.  Patient became tearful as she explained that she stands on her feet all day at her job and is barely able to walk.  She states she feels nauseated every morning and wonders if this is from her medications for diabetes.  She states that she needs a knee replacement but is concerned about not being able to work to provide for her family.  She admits to high levels and stress and worry due to her health, pain, and financial situation.  Will order social work consult to assist patient.  It seems that her high levels of stress are a barrier to her understanding and being able to care for herself and her diabetes.  Discussed with RN and nurse practitioner.   Will follow.       Note: Will need close follow-up with PCP regarding glycemic control and diabetes.

## 2012-04-06 NOTE — Consult Note (Signed)
Admit date: 04/05/2012 Referring Physician  : Dr. 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 They took her blood pressure and it was 190 systolic. She told them at that time that she had had some left neck pain as well as left arm pain radiating down her elbow intermittently at times over the week. This is somewhat new for her. Because of this, they encouraged her to go to the emergency room for further evaluation.  While here, her cardiac biomarkers are all negative, d-dimer was normal. EKG was unremarkable for any signs of ischemia. No ST segment changes. Intermittent she would have sinus tachycardia on EKG. She does state that she feels well currently this morning. Her arm pain/left neck pain was intermittent lasting approximately 5 minutes in duration at home did not appear to be exertional. She does state that it was moderate in severity.   She has experienced some weakness, fatigue, chronic nausea at times, some diaphoresis. These do not appear to be exertional symptoms. She does state that it is hard for her at times to go to the mailbox without feeling tired worn out. She has gained approximately 10 pounds she says since quitting smoking 2 years ago. She was seen by a pulmonologist at one point that assessed her lungs and told her that they were at 96% and that she did not have any evidence of COPD.  Her echocardiogram is currently being performed but at preliminarily appears to have normal pumping function. I will assess further.  She has no early family history of coronary  artery disease, her father had a pacemaker placed at 58. No significant alcohol use. Former smoker. She works as a Conservation officer, nature at [employer].  She states that in the year 2000 she was sitting on the toilet and passed out once. She was told that she needed a stress test at that time but she did not have it done. She has not had any further syncopal episodes.    PMH:   Past Medical History  Diagnosis Date  . Arthritis   . Depression   . Diabetes mellitus   . Headache   . Hypertension   . Thyroid disease   . Knee pain     right - to have knee replacement  . Carpal tunnel syndrome of left wrist   . Bulging disc     "lower"  . PONV (postoperative nausea and vomiting)     PSH:   Past Surgical History  Procedure Date  . Abdominal hysterectomy   . Cholecystectomy   . Elbow surgery     right   Allergies:  Lisinopril Prior to Admit Meds:   Prescriptions prior to admission  Medication Sig Dispense Refill  . glyBURIDE (DIABETA) 5 MG tablet Take 1 tablet (5 mg total) by mouth daily with breakfast.  30 tablet  11  . metFORMIN (GLUCOPHAGE) 1000 MG tablet Take 1 tablet (1,000 mg total) by mouth 2 (two) times daily with a meal.  60 tablet  11   Fam HX:    Family History  Problem Relation Age of Onset  . Arthritis  Mother   . Arthritis Father   . Hypertension Father   . Diabetes Father    Social HX:    History   Social History  . Marital Status: Married    Spouse Name: N/A    Number of Children: N/A  . Years of Education: N/A   Occupational History  . Not on file.   Social History Main Topics  . Smoking status: Former Smoker -- 0.2 packs/day for 20 years    Quit date: 10/13/2009  . Smokeless tobacco: Never Used  . Alcohol Use: No  . Drug Use: No  . Sexually Active: Not on file   Other Topics Concern  . Not on file   Social History Narrative  . No narrative on file     ROS:  All 11 ROS were addressed and are negative except what is stated in the HPI  Physical  Exam: Blood pressure 144/92, pulse 86, temperature 97.5 F (36.4 C), temperature source Axillary, resp. rate 19, height 5\' 4"  (1.626 m), weight 106.7 kg (235 lb 3.7 oz), SpO2 96.00%.    General: Well developed, well nourished, in no acute distress Head: Eyes PERRLA, No xanthomas.   Normal cephalic and atramatic  Lungs:   Clear bilaterally to auscultation and percussion. Normal respiratory effort. No wheezes, no rales. Heart:   HRRR S1 S2 Pulses are 2+ & equal. No murmur            No carotid bruit. No JVD.  No abdominal bruits. No femoral bruits. Abdomen: Bowel sounds are positive, abdomen soft and non-tender without masses. No hepatosplenomegaly. Obese Msk:  Back normal. Normal strength and tone for age. Extremities:   No clubbing, cyanosis or edema.  DP +1 Neuro: Alert and oriented X 3, non-focal, MAE x 4 GU: Deferred Rectal: Deferred Psych:  Good affect, responds appropriately    Labs:   Lab Results  Component Value Date   WBC 9.3 04/06/2012   HGB 14.7 04/06/2012   HCT 44.6 04/06/2012   MCV 94.3 04/06/2012   PLT 177 04/06/2012    Lab 04/06/12 0140  NA 134*  K 3.9  CL 98  CO2 24  BUN 19  CREATININE 0.72  CALCIUM 9.1  PROT 6.7  BILITOT 0.4  ALKPHOS 86  ALT 24  AST 28  GLUCOSE 237*   No results found for this basename: PTT   Lab Results  Component Value Date   INR 0.96 04/06/2012   Lab Results  Component Value Date   CKTOTAL 202* 04/06/2012   CKMB 5.0* 04/06/2012   TROPONINI <0.30 04/06/2012     Lab Results  Component Value Date   CHOL 166 04/06/2012   CHOL 148 06/27/2011   Lab Results  Component Value Date   HDL 49 04/06/2012   HDL 57.60 06/27/2011   Lab Results  Component Value Date   LDLCALC UNABLE TO CALCULATE IF TRIGLYCERIDE OVER 400 mg/dL 2/84/1324   LDLCALC 64 06/27/2011   Lab Results  Component Value Date   TRIG 413* 04/06/2012   TRIG 131.0 06/27/2011   Lab Results  Component Value Date   CHOLHDL 3.4 04/06/2012   CHOLHDL 3 06/27/2011   No  results found for this basename: LDLDIRECT      Radiology:  Dg Chest Portable 1 View  04/05/2012  *RADIOLOGY REPORT*  Clinical Data: Chest and left arm pain, history hypertension, diabetes  PORTABLE CHEST - 1 VIEW  Comparison: Portable exam 1323 hours compared to 10/15/2010  Findings: Upper-normal size of  cardiac silhouette. Mediastinal contours and pulmonary vascularity normal. Lungs grossly clear for technique. Chronic peribronchial thickening noted. No pleural effusion or pneumothorax. No acute osseous findings.  IMPRESSION: Chronic bronchitic changes.  Original Report Authenticated By: Lollie Marrow, M.D.   Dg Shoulder Left  04/05/2012  *RADIOLOGY REPORT*  Clinical Data: 61 year old female with diffuse left shoulder pain.  LEFT SHOULDER - 2+ VIEW  Comparison: Chest radiographs from the same day.  Findings: No glenohumeral joint dislocation.   Bone mineralization is within normal limits.  Proximal left humerus, left clavicle, and left scapula appear intact.  Visualized left ribs are within normal limits.  Increased pulmonary interstitial markings in the visible left lung.  IMPRESSION: No acute fracture or dislocation identified about the left shoulder.  Original Report Authenticated By: Harley Hallmark, M.D.   Personally viewed.  EKG:  As above Personally viewed.   ASSESSMENT/PLAN:   61 year old female diabetic former smoker, hypertension, obesity, chronic back pain with left arm pain/left neck pain, possible anginal equivalent versus musculoskeletal/neurologic phenomenon.  Possible angina-so far, cardiac biomarkers are reassuring, EKG is reassuring, d-dimer is reassuring low likelihood for pulmonary embolism. Echocardiogram does not show any weakness in her heart or any obvious wall motion abnormalities at first glance. This is a preliminary look. At this point, given the differential diagnosis of musculoskeletal discomfort as well, I feel comfortable allowing her to be discharged and report for a  2 day nuclear stress test in the outpatient setting. I expressed the importance of this.  Hypertension - pressure was elevated on arrival. Currently reasonably controlled. Continue to work on this as an outpatient. I would encourage use of ACE inhibitor with diabetes.  Obesity-expressed the importance of weight loss. This will help significantly also with her conditioning/fitness.  Diabetes - hemoglobin A1c 8.8. Uncontrolled. Continue to work closely with her primary physician. Diabetes is a coronary artery disease equivalent. Congratulated her cessation of smoking.  My office will be in touch with her regarding stress test.  Donato Schultz, MD  04/06/2012  9:59 AM

## 2012-04-07 DIAGNOSIS — I10 Essential (primary) hypertension: Secondary | ICD-10-CM

## 2012-04-07 DIAGNOSIS — E1165 Type 2 diabetes mellitus with hyperglycemia: Secondary | ICD-10-CM

## 2012-04-07 DIAGNOSIS — K219 Gastro-esophageal reflux disease without esophagitis: Secondary | ICD-10-CM

## 2012-04-07 DIAGNOSIS — IMO0001 Reserved for inherently not codable concepts without codable children: Secondary | ICD-10-CM

## 2012-04-07 DIAGNOSIS — I200 Unstable angina: Secondary | ICD-10-CM

## 2012-04-07 LAB — GLUCOSE, CAPILLARY: Glucose-Capillary: 230 mg/dL — ABNORMAL HIGH (ref 70–99)

## 2012-04-07 MED ORDER — HYDROCODONE-ACETAMINOPHEN 5-325 MG PO TABS: 1.0000 | ORAL_TABLET | Freq: Four times a day (QID) | ORAL | Status: AC | PRN

## 2012-04-07 MED ORDER — SIMVASTATIN 20 MG PO TABS: 20.0000 mg | ORAL_TABLET | Freq: Every day | ORAL | Status: DC

## 2012-04-07 MED ORDER — METOPROLOL TARTRATE 12.5 MG HALF TABLET: 12.5000 mg | ORAL_TABLET | Freq: Two times a day (BID) | ORAL | Status: DC

## 2012-04-07 MED ORDER — ASPIRIN 325 MG PO TBEC: 325.0000 mg | DELAYED_RELEASE_TABLET | Freq: Every day | ORAL | Status: AC

## 2012-04-07 NOTE — Discharge Summary (Signed)
Discharge Summary  Abigail Collins MR#: [id number]  DOB:10/18/1950  Date of Admission: 04/05/2012 Date of Discharge: 04/07/2012  Patient's PCP: Rogelia Boga, MD  Attending Physician:Zelena Bushong  Consults: Treatment Team:  Donato Schultz, MD   Discharge Diagnoses: Active Problems:  Unstable angina  Diabetes mellitus type 2 in obese  HTN (hypertension), benign  GERD (gastroesophageal reflux disease)  Morbid obesity   Brief Admitting History and Physical Abigail Collins comes in with left shoulder pain on and off for the last 4 days, which sometimes radiates to the neck and to the arm. She says that she has also had shortness of breath with exertion, and she has to stop vacuuming at times to catch her breath. This has been going on for a few months. She says she stops whatever activity she would be doing and starts feeling better. The episodes are associated with pressure in the chest, and occasionally diaphoresis. She denies fever or cough. In ED, she felt some relief after nitroglycerin and morphine. She has been referred to pulmonary previously and was told that there was nothing wrong with her lungs. At that point, her symptoms were felt to be related to lisinopril. Abigail Collins had also been referred for stress test but she has not had it done yet. Currently she is chest pain free. Her EKG showed sinus tachycardia, with no significant st-t wave changes, and cxr showed chronic bronchitic changes   Discharge Medications Medication List  As of 04/07/2012 11:03 AM   TAKE these medications         aspirin 325 MG EC tablet   Take 1 tablet (325 mg total) by mouth daily.      glyBURIDE 5 MG tablet   Commonly known as: DIABETA   Take 1 tablet (5 mg total) by mouth daily with breakfast.      HYDROcodone-acetaminophen 5-325 MG per tablet   Commonly known as: NORCO   Take 1-2 tablets by mouth every 6 (six) hours as needed.      metFORMIN 1000 MG tablet   Commonly known as:  GLUCOPHAGE   Take 1 tablet (1,000 mg total) by mouth 2 (two) times daily with a meal.      metoprolol tartrate 12.5 mg Tabs   Commonly known as: LOPRESSOR   Take 0.5 tablets (12.5 mg total) by mouth 2 (two) times daily.      simvastatin 20 MG tablet   Commonly known as: ZOCOR   Take 1 tablet (20 mg total) by mouth daily at 6 PM.            Hospital Course: 61 year old female diabetic former smoker, hypertension, obesity, chronic back pain with left arm pain/left neck pain, possible anginal equivalent versus musculoskeletal/neurologic phenomenon.  Possible angina-so far, cardiac biomarkers are reassuring, EKG is reassuring, d-dimer is reassuring low likelihood for pulmonary embolism. Echocardiogram does not show any weakness in her heart or any obvious wall motion abnormalities at first glance. 2 day nuclear stress test in the outpatient setting.  Hypertension - pressure was elevated on arrival. Currently reasonably controlled. Continue to work on this as an outpatient. I would encourage use of ACE inhibitor with diabetes.  Obesity-expressed the importance of weight loss.  Diabetes - hemoglobin A1c 8.8. Uncontrolled. Continue to work closely with her primary physician.       Day of Discharge BP 136/85  Pulse 85  Temp 97.6 F (36.4 C) (Oral)  Resp 16  Ht 5\' 4"  (1.626 m)  Wt 106.7 kg (235 lb 3.7  oz)  BMI 40.38 kg/m2  SpO2 94% A+O x3 NAD -c/c/e +BS, obese Clear b/l   Results for orders placed during the hospital encounter of 04/05/12 (from the past 48 hour(s))  CBC     Status: Abnormal   Collection Time   04/05/12 12:20 PM      Component Value Range Comment   WBC 8.9  4.0 - 10.5 K/uL    RBC 4.88  3.87 - 5.11 MIL/uL    Hemoglobin 15.3 (*) 12.0 - 15.0 g/dL    HCT 16.1  09.6 - 04.5 %    MCV 92.6  78.0 - 100.0 fL    MCH 31.4  26.0 - 34.0 pg    MCHC 33.8  30.0 - 36.0 g/dL    RDW 40.9  81.1 - 91.4 %    Platelets 216  150 - 400 K/uL   POCT I-STAT TROPONIN I     Status:  Normal   Collection Time   04/05/12 12:24 PM      Component Value Range Comment   Troponin i, poc 0.01  0.00 - 0.08 ng/mL    Comment 3            BASIC METABOLIC PANEL     Status: Abnormal   Collection Time   04/05/12 12:57 PM      Component Value Range Comment   Sodium 135  135 - 145 mEq/L    Potassium 4.0  3.5 - 5.1 mEq/L    Chloride 99  96 - 112 mEq/L    CO2 22  19 - 32 mEq/L    Glucose, Bld 218 (*) 70 - 99 mg/dL    BUN 22  6 - 23 mg/dL    Creatinine, Ser 7.82  0.50 - 1.10 mg/dL    Calcium 9.8  8.4 - 95.6 mg/dL    GFR calc non Af Amer >90  >90 mL/min    GFR calc Af Amer >90  >90 mL/min   POCT I-STAT TROPONIN I     Status: Normal   Collection Time   04/05/12  1:40 PM      Component Value Range Comment   Troponin i, poc 0.00  0.00 - 0.08 ng/mL    Comment 3            D-DIMER, QUANTITATIVE     Status: Normal   Collection Time   04/05/12  3:36 PM      Component Value Range Comment   D-Dimer, Quant 0.29  0.00 - 0.48 ug/mL-FEU   MAGNESIUM     Status: Normal   Collection Time   04/05/12  6:40 PM      Component Value Range Comment   Magnesium 2.0  1.5 - 2.5 mg/dL   PHOSPHORUS     Status: Normal   Collection Time   04/05/12  6:40 PM      Component Value Range Comment   Phosphorus 3.7  2.3 - 4.6 mg/dL   TSH     Status: Normal   Collection Time   04/05/12  6:40 PM      Component Value Range Comment   TSH 3.158  0.350 - 4.500 uIU/mL   HEMOGLOBIN A1C     Status: Abnormal   Collection Time   04/05/12  6:40 PM      Component Value Range Comment   Hemoglobin A1C 8.8 (*) <5.7 %    Mean Plasma Glucose 206 (*) <117 mg/dL   CARDIAC PANEL(CRET KIN+CKTOT+MB+TROPI)     Status:  Abnormal   Collection Time   04/05/12  6:40 PM      Component Value Range Comment   Total CK 194 (*) 7 - 177 U/L    CK, MB 5.0 (*) 0.3 - 4.0 ng/mL    Troponin I <0.30  <0.30 ng/mL    Relative Index 2.6 (*) 0.0 - 2.5   URINALYSIS, ROUTINE W REFLEX MICROSCOPIC     Status: Abnormal   Collection Time   04/05/12   8:31 PM      Component Value Range Comment   Color, Urine YELLOW  YELLOW    APPearance CLEAR  CLEAR    Specific Gravity, Urine 1.025  1.005 - 1.030    pH 5.0  5.0 - 8.0    Glucose, UA 500 (*) NEGATIVE mg/dL    Hgb urine dipstick NEGATIVE  NEGATIVE    Bilirubin Urine NEGATIVE  NEGATIVE    Ketones, ur NEGATIVE  NEGATIVE mg/dL    Protein, ur NEGATIVE  NEGATIVE mg/dL    Urobilinogen, UA 0.2  0.0 - 1.0 mg/dL    Nitrite NEGATIVE  NEGATIVE    Leukocytes, UA NEGATIVE  NEGATIVE MICROSCOPIC NOT DONE ON URINES WITH NEGATIVE PROTEIN, BLOOD, LEUKOCYTES, NITRITE, OR GLUCOSE <1000 mg/dL.  GLUCOSE, CAPILLARY     Status: Abnormal   Collection Time   04/05/12  9:43 PM      Component Value Range Comment   Glucose-Capillary 289 (*) 70 - 99 mg/dL   PRO B NATRIURETIC PEPTIDE     Status: Normal   Collection Time   04/06/12  1:40 AM      Component Value Range Comment   Pro B Natriuretic peptide (BNP) 12.9  0 - 125 pg/mL   PROTIME-INR     Status: Normal   Collection Time   04/06/12  1:40 AM      Component Value Range Comment   Prothrombin Time 13.0  11.6 - 15.2 seconds    INR 0.96  0.00 - 1.49   APTT     Status: Abnormal   Collection Time   04/06/12  1:40 AM      Component Value Range Comment   aPTT 40 (*) 24 - 37 seconds   COMPREHENSIVE METABOLIC PANEL     Status: Abnormal   Collection Time   04/06/12  1:40 AM      Component Value Range Comment   Sodium 134 (*) 135 - 145 mEq/L    Potassium 3.9  3.5 - 5.1 mEq/L    Chloride 98  96 - 112 mEq/L    CO2 24  19 - 32 mEq/L    Glucose, Bld 237 (*) 70 - 99 mg/dL    BUN 19  6 - 23 mg/dL    Creatinine, Ser 3.47  0.50 - 1.10 mg/dL    Calcium 9.1  8.4 - 42.5 mg/dL    Total Protein 6.7  6.0 - 8.3 g/dL    Albumin 3.6  3.5 - 5.2 g/dL    AST 28  0 - 37 U/L NO VISIBLE HEMOLYSIS   ALT 24  0 - 35 U/L    Alkaline Phosphatase 86  39 - 117 U/L    Total Bilirubin 0.4  0.3 - 1.2 mg/dL    GFR calc non Af Amer >90  >90 mL/min    GFR calc Af Amer >90  >90 mL/min   CBC      Status: Normal   Collection Time   04/06/12  1:40 AM  Component Value Range Comment   WBC 9.3  4.0 - 10.5 K/uL    RBC 4.73  3.87 - 5.11 MIL/uL    Hemoglobin 14.7  12.0 - 15.0 g/dL    HCT 16.1  09.6 - 04.5 %    MCV 94.3  78.0 - 100.0 fL    MCH 31.1  26.0 - 34.0 pg    MCHC 33.0  30.0 - 36.0 g/dL    RDW 40.9  81.1 - 91.4 %    Platelets 177  150 - 400 K/uL   LIPID PANEL     Status: Abnormal   Collection Time   04/06/12  1:40 AM      Component Value Range Comment   Cholesterol 166  0 - 200 mg/dL    Triglycerides 782 (*) <150 mg/dL    HDL 49  >95 mg/dL    Total CHOL/HDL Ratio 3.4      VLDL UNABLE TO CALCULATE IF TRIGLYCERIDE OVER 400 mg/dL  0 - 40 mg/dL    LDL Cholesterol UNABLE TO CALCULATE IF TRIGLYCERIDE OVER 400 mg/dL  0 - 99 mg/dL   CARDIAC PANEL(CRET KIN+CKTOT+MB+TROPI)     Status: Abnormal   Collection Time   04/06/12  1:40 AM      Component Value Range Comment   Total CK 202 (*) 7 - 177 U/L    CK, MB 5.0 (*) 0.3 - 4.0 ng/mL    Troponin I <0.30  <0.30 ng/mL    Relative Index 2.5  0.0 - 2.5   GLUCOSE, CAPILLARY     Status: Abnormal   Collection Time   04/06/12  8:06 AM      Component Value Range Comment   Glucose-Capillary 277 (*) 70 - 99 mg/dL   CARDIAC PANEL(CRET KIN+CKTOT+MB+TROPI)     Status: Abnormal   Collection Time   04/06/12  9:42 AM      Component Value Range Comment   Total CK 189 (*) 7 - 177 U/L    CK, MB 4.8 (*) 0.3 - 4.0 ng/mL    Troponin I <0.30  <0.30 ng/mL    Relative Index 2.5  0.0 - 2.5   GLUCOSE, CAPILLARY     Status: Abnormal   Collection Time   04/06/12 11:33 AM      Component Value Range Comment   Glucose-Capillary 244 (*) 70 - 99 mg/dL   GLUCOSE, CAPILLARY     Status: Abnormal   Collection Time   04/06/12  4:15 PM      Component Value Range Comment   Glucose-Capillary 154 (*) 70 - 99 mg/dL   GLUCOSE, CAPILLARY     Status: Abnormal   Collection Time   04/06/12 10:44 PM      Component Value Range Comment   Glucose-Capillary 188 (*) 70 -  99 mg/dL   GLUCOSE, CAPILLARY     Status: Abnormal   Collection Time   04/07/12  8:11 AM      Component Value Range Comment   Glucose-Capillary 230 (*) 70 - 99 mg/dL     Dg Chest Portable 1 View  04/05/2012  *RADIOLOGY REPORT*  Clinical Data: Chest and left arm pain, history hypertension, diabetes  PORTABLE CHEST - 1 VIEW  Comparison: Portable exam 1323 hours compared to 10/15/2010  Findings: Upper-normal size of cardiac silhouette. Mediastinal contours and pulmonary vascularity normal. Lungs grossly clear for technique. Chronic peribronchial thickening noted. No pleural effusion or pneumothorax. No acute osseous findings.  IMPRESSION: Chronic bronchitic changes.  Original Report Authenticated  By: Lollie Marrow, M.D.   Dg Shoulder Left  04/05/2012  *RADIOLOGY REPORT*  Clinical Data: 61 year old female with diffuse left shoulder pain.  LEFT SHOULDER - 2+ VIEW  Comparison: Chest radiographs from the same day.  Findings: No glenohumeral joint dislocation.   Bone mineralization is within normal limits.  Proximal left humerus, left clavicle, and left scapula appear intact.  Visualized left ribs are within normal limits.  Increased pulmonary interstitial markings in the visible left lung.  IMPRESSION: No acute fracture or dislocation identified about the left shoulder.  Original Report Authenticated By: Ulla Potash III, M.D.   LV EF: 55% - 60%  ------------------------------------------------------------ Indications: Chest pain 786.51.  ------------------------------------------------------------ History: Risk factors: Former tobacco use. Hypertension. Diabetes mellitus. Morbidly obese.  ------------------------------------------------------------ Study Conclusions  Left ventricle: The cavity size was normal. There was mild concentric hypertrophy. Systolic function was normal. The estimated ejection fraction was in the range of 55% to 60%. Wall motion was normal; there were no regional wall  motion abnormalities. Doppler parameters are consistent with abnormal left ventricular relaxation (grade 1 diastolic dysfunction). Transthoracic echocardiography. M-mode, complete 2D, spectral Doppler, and color Doppler. Height: Height: 162.6cm. Height: 64in. Weight: Weight: 106.8kg. Weight: 235lb. Body mass index: BMI: 40.4kg/m^2. Body surface area: BSA: 2.82m^2. Blood pressure: 136/87. Patient status: Inpatient. Location: Bedside.  ------------------------------------------------------------  ------------------------------------------------------------ Left ventricle: The cavity size was normal. There was mild concentric hypertrophy. Systolic function was normal. The estimated ejection fraction was in the range of 55% to 60%. Wall motion was normal; there were no regional wall motion abnormalities. Doppler parameters are consistent with abnormal left ventricular relaxation (grade 1 diastolic dysfunction).  ------------------------------------------------------------ Aortic valve: Poorly visualized. Structurally normal valve. Cusp separation was normal. Doppler: Transvalvular velocity was within the normal range. There was no stenosis. No regurgitation.  ------------------------------------------------------------ Aorta: The aorta was normal, not dilated, and non-diseased.  ------------------------------------------------------------ Mitral valve: Structurally normal valve. Leaflet separation was normal. Doppler: Transvalvular velocity was within the normal range. There was no evidence for stenosis. No regurgitation.  ------------------------------------------------------------ Left atrium: The atrium was normal in size.  ------------------------------------------------------------ Right ventricle: The cavity size was normal. Wall thickness was normal. Systolic function was normal.  ------------------------------------------------------------ Pulmonic valve: Structurally  normal valve. The valve appears to be grossly normal. Cusp separation was normal. Doppler: Transvalvular velocity was within the normal range. No regurgitation.  ------------------------------------------------------------ Tricuspid valve: Structurally normal valve. Leaflet separation was normal. Doppler: No regurgitation.  ------------------------------------------------------------ Pulmonary artery: The main pulmonary artery was normal-sized.  ------------------------------------------------------------ Right atrium: The atrium was normal in size.  ------------------------------------------------------------ Pericardium: The pericardium was normal in appearance. There was no pericardial effusion.  ------------------------------------------------------------ Systemic veins: Inferior vena cava: The vessel was normal in size; the respirophasic diameter changes were in the normal range (= 50%); findings are consistent with normal central venous pressure.  ------------------------------------------------------------ Post procedure conclusions Ascending Aorta:  - The aorta was normal, not dilated, and non-diseased.     Disposition: home  Diet: cardiac/diabetic  Activity: as tolerated   Follow-up Appts: Discharge Orders    Future Orders Please Complete By Expires   Diet - low sodium heart healthy      Diet Carb Modified      Increase activity slowly      Discharge instructions      Comments:   FLP and LFTs in 6 weeks Follow up with cardiology for stress test on Thurs Please get patient new wrist splint before d/c      TESTS THAT NEED FOLLOW-UP Stress test  done as outpatient  Time spent on discharge, talking to the patient, and coordinating care: 34 mins.   SignedMarlin Canary, DO 04/07/2012, 11:03 AM

## 2012-04-07 NOTE — Progress Notes (Signed)
Ortho tech came and tried to deliver patient a left wrist brace.  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

## 2012-04-17 ENCOUNTER — Encounter (HOSPITAL_COMMUNITY): Payer: Self-pay | Admitting: *Deleted

## 2012-04-17 ENCOUNTER — Emergency Department (HOSPITAL_COMMUNITY)
Admission: EM | Admit: 2012-04-17 | Discharge: 2012-04-17 | Disposition: A | Discharge status: Home / Self Care | Payer: BC Managed Care – PPO | Attending: Emergency Medicine | Admitting: Emergency Medicine

## 2012-04-17 DIAGNOSIS — M129 Arthropathy, unspecified: Secondary | ICD-10-CM | POA: Insufficient documentation

## 2012-04-17 DIAGNOSIS — Z87891 Personal history of nicotine dependence: Secondary | ICD-10-CM | POA: Insufficient documentation

## 2012-04-17 DIAGNOSIS — E079 Disorder of thyroid, unspecified: Secondary | ICD-10-CM | POA: Insufficient documentation

## 2012-04-17 DIAGNOSIS — I10 Essential (primary) hypertension: Secondary | ICD-10-CM | POA: Insufficient documentation

## 2012-04-17 DIAGNOSIS — N3090 Cystitis, unspecified without hematuria: Secondary | ICD-10-CM | POA: Insufficient documentation

## 2012-04-17 DIAGNOSIS — E119 Type 2 diabetes mellitus without complications: Secondary | ICD-10-CM | POA: Insufficient documentation

## 2012-04-17 LAB — URINALYSIS, ROUTINE W REFLEX MICROSCOPIC
Bilirubin Urine: NEGATIVE
Glucose, UA: >1000 mg/dL — AB
Ketones, ur: NEGATIVE mg/dL
Nitrite: NEGATIVE
Protein, ur: NEGATIVE mg/dL
Specific Gravity, Urine: 1.019 (ref 1.005–1.030)
Urobilinogen, UA: 1 mg/dL (ref 0.0–1.0)
pH: 7 (ref 5.0–8.0)

## 2012-04-17 LAB — URINE MICROSCOPIC-ADD ON

## 2012-04-17 MED ORDER — CEPHALEXIN 500 MG PO CAPS: 500.0000 mg | ORAL_CAPSULE | Freq: Three times a day (TID) | ORAL | Status: AC

## 2012-04-17 MED ORDER — PHENAZOPYRIDINE HCL 200 MG PO TABS
200.0000 mg | ORAL_TABLET | Freq: Three times a day (TID) | ORAL | Status: DC
  Administered 2012-04-17: 200 mg via ORAL
  Filled 2012-04-17: qty 1

## 2012-04-17 MED ORDER — PHENAZOPYRIDINE HCL 200 MG PO TABS: 200.0000 mg | ORAL_TABLET | Freq: Two times a day (BID) | ORAL | Status: AC

## 2012-04-17 MED ORDER — CEPHALEXIN 500 MG PO CAPS
500.0000 mg | ORAL_CAPSULE | Freq: Once | ORAL | Status: AC
Start: 2012-04-17 — End: 2012-04-17
  Administered 2012-04-17: 500 mg via ORAL
  Filled 2012-04-17: qty 1

## 2012-04-17 NOTE — ED Provider Notes (Signed)
Medical screening examination/treatment/procedure(s) were performed by non-physician practitioner and as supervising physician I was immediately available for consultation/collaboration.   Kattleya Kuhnert, MD 04/17/12 0745 

## 2012-04-17 NOTE — ED Provider Notes (Signed)
History     CSN: [id number]  Arrival date & time 04/17/12  0434   None     Chief Complaint  Patient presents with  . Hematuria    (Consider location/radiation/quality/duration/timing/severity/associated sxs/prior treatment) HPI Comments: Patient has cardiac stress test today this evening she noted a feeling of pelvic pressure thinkin gshe had to have a BM  She did have several small BM but the feeling persisted.  The next trip to the [facility] revealed hematuria with an increase in th elow bilateral pelvic pressure Denies N/V/D fever   The history is provided by the patient.    Past Medical History  Diagnosis Date  . Arthritis   . Depression   . Diabetes mellitus   . Headache   . Hypertension   . Thyroid disease   . Knee pain     right - to have knee replacement  . Carpal tunnel syndrome of left wrist   . Bulging disc     "lower"  . PONV (postoperative nausea and vomiting)     Past Surgical History  Procedure Date  . Abdominal hysterectomy   . Cholecystectomy   . Elbow surgery     right    Family History  Problem Relation Age of Onset  . Arthritis Mother   . Arthritis Father   . Hypertension Father   . Diabetes Father     History  Substance Use Topics  . Smoking status: Former Smoker -- 0.2 packs/day for 20 years    Quit date: 10/13/2009  . Smokeless tobacco: Never Used  . Alcohol Use: No    OB History    Grav Para Term Preterm Abortions TAB SAB Ect Mult Living                  Review of Systems  Constitutional: Negative for fever and chills.  Gastrointestinal: Negative for nausea and rectal pain.  Genitourinary: Positive for dysuria, frequency and hematuria.  Neurological: Negative for dizziness.    Allergies  Lisinopril  Home Medications   Current Outpatient Rx  Name Route Sig Dispense Refill  . ASPIRIN 325 MG PO TBEC Oral Take 1 tablet (325 mg total) by mouth daily. 30 tablet   . GLYBURIDE 5 MG PO TABS Oral Take 1 tablet (5 mg total) by  mouth daily with breakfast. 30 tablet 11  . HYDROCODONE-ACETAMINOPHEN 5-325 MG PO TABS Oral Take 1-2 tablets by mouth every 6 (six) hours as needed. 15 tablet 0  . METFORMIN HCL 1000 MG PO TABS Oral Take 1 tablet (1,000 mg total) by mouth 2 (two) times daily with a meal. 60 tablet 11  . METOPROLOL TARTRATE 12.5 MG HALF TABLET Oral Take 0.5 tablets (12.5 mg total) by mouth 2 (two) times daily. 60 tablet 0  . SIMVASTATIN 20 MG PO TABS Oral Take 1 tablet (20 mg total) by mouth daily at 6 PM. 30 tablet 0  . CEPHALEXIN 500 MG PO CAPS Oral Take 1 capsule (500 mg total) by mouth 3 (three) times daily. 30 capsule 0  . PHENAZOPYRIDINE HCL 200 MG PO TABS Oral Take 1 tablet (200 mg total) by mouth 2 (two) times daily with a meal. 10 tablet 0    BP 151/86  Pulse 90  Temp 97.5 F (36.4 C) (Oral)  Resp 16  SpO2 98%  Physical Exam  Constitutional: She is oriented to person, place, and time. She appears well-developed and well-nourished.  HENT:  Head: Normocephalic.  Eyes: Pupils are equal, round, and reactive  to light.  Neck: Normal range of motion.  Cardiovascular: Normal rate.   Pulmonary/Chest: Effort normal.  Abdominal: Soft. She exhibits no distension. There is no tenderness.  Musculoskeletal: Normal range of motion.  Neurological: She is alert and oriented to person, place, and time.  Skin: Skin is warm. No pallor.    ED Course  Procedures (including critical care time)  Labs Reviewed  URINALYSIS, ROUTINE W REFLEX MICROSCOPIC - Abnormal; Notable for the following:    APPearance CLOUDY (*)     Glucose, UA >1000 (*)     Hgb urine dipstick LARGE (*)     Leukocytes, UA MODERATE (*)     All other components within normal limits  URINE MICROSCOPIC-ADD ON  URINE CULTURE   No results found.   1. Cystitis       MDM  UTI with hematuria        Arman Filter, NP 04/17/12 0540  Arman Filter, NP 04/17/12 0541  Arman Filter, NP 04/17/12 [phone]

## 2012-04-17 NOTE — ED Notes (Signed)
Pt c/o hematuria starting last night.

## 2012-04-19 LAB — URINE CULTURE
Colony Count: >=100000
Special Requests: NORMAL

## 2012-04-20 NOTE — ED Notes (Signed)
Results received from [facility]. (+) URNC -> >/= 100,000 colonies Enterobacter Aerogenes.  Rx for Keflex -> Resistant to the same.  Chart to MD office for review.

## 2012-04-21 NOTE — ED Notes (Signed)
D/C Keflex. Rx for Ciprofloxacin 500 mg 1 tab by mouth twice a day for 7 days #14 no refills per Lorenz Coaster.

## 2012-04-23 NOTE — ED Notes (Signed)
Patient refused rx because she will be  Having cath done soon.She stated that she is better.

## 2012-05-07 ENCOUNTER — Other Ambulatory Visit: Payer: Self-pay | Admitting: Cardiology

## 2012-05-12 NOTE — H&P (Signed)
Office Visit     Patient: Abigail Collins Provider: Michaell Cowing Emelda Fear, NP  DOB: July 11, 1951   Age: 61 Y   Sex: Female Date: 05/07/2012  Phone: [phone]  Address: [address], [place], [place]    --------------------------------------------------------------------------------  Subjective:    CC:      1. MS/CATH WORK UP/NEEDS CATH IN THE JV LAB/SEE LINDA.      HPI:     General:           Abigail Collins is a 61 yo female seen by Dr Anne Fu after ED visit 04/07/12 due to left shoulder pain that radiates into neck and along with shortness of breath with exertion for a few months. She felt some relief after given NTG sl in ED. Cardiac enzymes, [name], and EKG stable.         Abnormal nuclear stress test with a mild degree of ischemia cannot be excluded in the basal inferolateral region as well as apex/anteroseptal.          She feels chronically fatigued, SOB and states her weight has increased. She has a dry cough at times. She states she has chest tightness and indigestion, nonexertional, and it will radiate to her arm and comes and goes. .         Patient denies dizziness, syncope, nor PND. She has chronic knee pain with need for right knee surgery.     ROS:      as noted in HPI, no fever nor wheezing seen in ED in July with UTI and given antibiotics. no productive cough, + occasinal headaches, uses cane due to knee pain, She feels alot of nausea. and has vomited at times, feels her nausea may be related to her diabetic medications. no black or bloody BMs,  she had not followed up with a PCP since having UTI but feels symptoms resolved. no IVP nor seafood allergy.     Medical History: diabetes, A1C--8.8, Hypertension, Chronic back pain, 6/13--echocardiogram EF 55-60%, mild hypertrophy, grade 1 diastolic dysfunciton.      Surgical History: abdominal hysterectomy , cholecyctectomy , elbow surgery .      Family History:  Father: deceased hypertension, diabetes, pacemaker  Mother: deceased arthritis      Social History:      General: History of smoking  cigarettes:  Former smoker, Quit in year  2011. Occupation: Conservation officer, nature for [employer]. Marital Status: married.     former smoker --0.2 packes/day for 20 yrs quit X 2 years, saw pulmonolgist and felt lungs were stable.     Medications: Metformin HCl 1000 MG Tablet 1 tablet BID, Aspirin 325 MG Tablet 1 tablet Once a day, Zocor 20 MG Tablet 1 tablet every evening Once a day, Metoprolol Tartrate 25 MG Tablet 1/2 tab once a day, GlyBURIDE 5 MG Tablet 1 tablet Once a day, Medication List reviewed and reconciled with the patient     Allergies: N.K.Collins.A.     Objective:    Vitals: Wt 244.4, Ht 64, BMI 41.95, Pulse sitting 91, BP sitting 156/104 dynamap.     Examination:     Cardiology, General:         GENERAL APPEARANCE: obese female in NAD.  HEENT: unremarkable.  CAROTID UPSTROKE: normal, no bruit.  JVD: flat.  HEART SOUNDS: regular, normal S1, S2, no S3 or S4.  MURMUR: absent.  LUNGS: no rales or wheezes.  ABDOMEN: soft, non tender, positive bowel sounds, .  EXTREMITIES: trace pretibial leg edema, & ankles right >  left.  PERIPHERAL PULSES: 2 plus bilateral.            Assessment:    Assessment:  1. Abnormal nuclear stress test - 794.39 (Primary), will proceed with cardiac cath   2. Hypertension - 401.9     Plan:    1. Abnormal nuclear stress test        LAB: Basic Metabolic      GLUCOSE 150 70-99 - mg/dL H       BUN 12 1-47 - mg/dL        CREATININE 8.29 0.60-1.30 - mg/dl        eGFR (NON-AFRICAN AMERICAN) 84 >60 - calc        eGFR (AFRICAN AMERICAN) 102 >60 - calc        SODIUM 141 136-145 - mmol/L        POTASSIUM 3.7 3.5-5.5 - mmol/L        CHLORIDE 107 98-107 - mmol/L        C02 25 22-32 - mg/dL        ANION GAP 56.2 1.3-08.6 - mmol/L        CALCIUM 9.6 8.6-10.3 - mg/dL               FERGUSON,CYNTHIA A 05/07/2012 05:26:42 PM > ok for cath, please have sent to pt's PCP related to her glucose level  Plummer,Wanda 05/11/2012 12:30:32 PM > lmtc        LAB: PT and PTT ([id number])     aPTT 27 24-33 - SEC        INR 0.9 0.8-1.2 -        Prothrombin Time 10.0 9.1-12.0 - SEC               FERGUSON,CYNTHIA A 05/09/2012 05:28:35 PM > ok for cath   will proceed with cardiac catheterization, , Risks and benefits of cardiac catheterization have been reviewed including risk of stroke, heart attack, death, bleeding, renal impariment and arterial damage. There was ample oppurtuny to answer questions. Alternatives were discussed. Patient understands and wishes to proceed.       2. Hypertension  Continue Aspirin Tablet, 325 MG, 1 tablet, Orally, Once a day ;  Increase Metoprolol Tartrate Tablet, 25 MG, 1/2 tab, Orally, Twice a day .    explained to pt the need for low sodium diet and weight loss and that metoprolol tartrate needs to be taken BID dosing, over time we may need to further increase HTN therapy, and will start by increasing Metoprolol.       3. Others   Continue GlyBURIDE Tablet, 5 MG, 1 tablet, Orally, Once a day ;  Continue Metformin HCl Tablet, 1000 MG, 1 tablet, Orally, BID .          Immunizations:       Labs:      Procedure Codes: 62952 ECL BMP, [id number] BLOOD COLLECTION ROUTINE VENIPUNCTURE     Preventive:           Follow Up: MS pending cath (Reason: Cardiac cath )        Provider: Aram Beecham A. Emelda Fear, NP  Patient: Abigail Collins, Abigail Collins  DOB: 1951/07/16  Date: 05/07/2012

## 2012-05-13 ENCOUNTER — Inpatient Hospital Stay (HOSPITAL_BASED_OUTPATIENT_CLINIC_OR_DEPARTMENT_OTHER)
Admission: RE | Admit: 2012-05-13 | Discharge: 2012-05-13 | Disposition: A | Discharge status: Home / Self Care | Payer: BC Managed Care – PPO | Source: Ambulatory Visit | Attending: Cardiology | Admitting: Cardiology

## 2012-05-13 ENCOUNTER — Encounter (HOSPITAL_BASED_OUTPATIENT_CLINIC_OR_DEPARTMENT_OTHER)
Admission: RE | Disposition: A | Discharge status: Home / Self Care | Payer: Self-pay | Source: Ambulatory Visit | Attending: Cardiology

## 2012-05-13 DIAGNOSIS — I10 Essential (primary) hypertension: Secondary | ICD-10-CM | POA: Insufficient documentation

## 2012-05-13 DIAGNOSIS — R9439 Abnormal result of other cardiovascular function study: Secondary | ICD-10-CM | POA: Diagnosis present

## 2012-05-13 DIAGNOSIS — E119 Type 2 diabetes mellitus without complications: Secondary | ICD-10-CM | POA: Insufficient documentation

## 2012-05-13 DIAGNOSIS — R079 Chest pain, unspecified: Secondary | ICD-10-CM | POA: Insufficient documentation

## 2012-05-13 HISTORY — PX: CARDIAC CATHETERIZATION: SHX172

## 2012-05-13 LAB — POCT I-STAT GLUCOSE
Glucose, Bld: 231 mg/dL — ABNORMAL HIGH (ref 70–99)
Operator id: 221371

## 2012-05-13 SURGERY — JV LEFT HEART CATHETERIZATION WITH CORONARY ANGIOGRAM
Anesthesia: Moderate Sedation

## 2012-05-13 MED ORDER — ONDANSETRON HCL 4 MG/2ML IJ SOLN: 4.0000 mg | Freq: Four times a day (QID) | INTRAMUSCULAR | Status: DC | PRN

## 2012-05-13 MED ORDER — SODIUM CHLORIDE 0.9 % IJ SOLN: 3.0000 mL | Freq: Two times a day (BID) | INTRAMUSCULAR | Status: DC

## 2012-05-13 MED ORDER — SODIUM CHLORIDE 0.9 % IJ SOLN: 3.0000 mL | INTRAMUSCULAR | Status: DC | PRN

## 2012-05-13 MED ORDER — SODIUM CHLORIDE 0.9 % IV SOLN: 1.0000 mL/kg/h | INTRAVENOUS | Status: AC

## 2012-05-13 MED ORDER — ASPIRIN 81 MG PO CHEW
324.0000 mg | CHEWABLE_TABLET | ORAL | Status: AC
  Administered 2012-05-13: 324 mg via ORAL

## 2012-05-13 MED ORDER — SODIUM CHLORIDE 0.9 % IV SOLN
INTRAVENOUS | Status: DC
  Administered 2012-05-13: 08:00:00 via INTRAVENOUS

## 2012-05-13 MED ORDER — SODIUM CHLORIDE 0.9 % IV SOLN: 250.0000 mL | INTRAVENOUS | Status: DC | PRN

## 2012-05-13 MED ORDER — DIAZEPAM 5 MG PO TABS
5.0000 mg | ORAL_TABLET | ORAL | Status: AC
  Administered 2012-05-13: 5 mg via ORAL

## 2012-05-13 MED ORDER — ACETAMINOPHEN 325 MG PO TABS: 650.0000 mg | ORAL_TABLET | ORAL | Status: DC | PRN

## 2012-05-13 NOTE — Interval H&P Note (Signed)
History and Physical Interval Note:  05/13/2012 8:39 AM  Abigail Collins  has presented today for surgery, with the diagnosis of cp  The various methods of treatment have been discussed with the patient and family. After consideration of risks, benefits and other options for treatment, the patient has consented to  Procedure(s) (LRB): JV LEFT HEART CATHETERIZATION WITH CORONARY ANGIOGRAM (N/A) as a surgical intervention .  The patient's history has been reviewed, patient examined, no change in status, stable for surgery.  I have reviewed the patient's chart and labs.  Questions were answered to the patient's satisfaction.     SKAINS, MARK

## 2012-05-13 NOTE — H&P (View-Only) (Signed)
Office Visit     Patient: Abigail Collins, Abigail Collins Provider: Cynthia A. Ferguson, NP  DOB: 06/25/1951   Age: 61 Y   Sex: Female Date: 05/07/2012  Phone: [phone]  Address: [address], [place], [place]    --------------------------------------------------------------------------------  Subjective:    CC:      1. MS/CATH WORK UP/NEEDS CATH IN THE [facility] LAB/SEE LINDA.      HPI:     General:           Abigail Collins is a 61 yo female seen by Dr Meghin Thivierge after ED visit 04/07/12 due to left shoulder pain that radiates into neck and along with shortness of breath with exertion for a few months. She felt some relief after given NTG sl in ED. Cardiac enzymes, [name], and EKG stable.         Abnormal nuclear stress test with a mild degree of ischemia cannot be excluded in the basal inferolateral region as well as apex/anteroseptal.          She feels chronically fatigued, SOB and states her weight has increased. She has a dry cough at times. She states she has chest tightness and indigestion, nonexertional, and it will radiate to her arm and comes and goes. .         Patient denies dizziness, syncope, nor PND. She has chronic knee pain with need for right knee surgery.     ROS:      as noted in HPI, no fever nor wheezing seen in ED in July with UTI and given antibiotics. no productive cough, + occasinal headaches, uses cane due to knee pain, She feels alot of nausea. and has vomited at times, feels her nausea may be related to her diabetic medications. no black or bloody BMs,  she had not followed up with a PCP since having UTI but feels symptoms resolved. no IVP nor seafood allergy.     Medical History: diabetes, A1C--8.8, Hypertension, Chronic back pain, 6/13--echocardiogram EF 55-60%, mild hypertrophy, grade 1 diastolic dysfunciton.      Surgical History: abdominal hysterectomy , cholecyctectomy , elbow surgery .      Family History:  Father: deceased hypertension, diabetes, pacemaker  Mother: deceased arthritis      Social History:      General: History of smoking  cigarettes:  Former smoker, Quit in year  2011. 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 Marital Status: married.     former smoker --0.2 packes/day for 20 yrs quit X 2 years, saw pulmonolgist and felt lungs were stable.     Medications: Metformin HCl 1000 MG Tablet 1 tablet BID, Aspirin 325 MG Tablet 1 tablet Once a day, Zocor 20 MG Tablet 1 tablet every evening Once a day, Metoprolol Tartrate 25 MG Tablet 1/2 tab once a day, GlyBURIDE 5 MG Tablet 1 tablet Once a day, Medication List reviewed and reconciled with the patient     Allergies: N.K.Collins.A.     Objective:    Vitals: Wt 244.4, Ht 64, BMI 41.95, Pulse sitting 91, BP sitting 156/104 dynamap.     Examination:     Cardiology, General:         GENERAL APPEARANCE: obese female in NAD.  HEENT: unremarkable.  CAROTID UPSTROKE: normal, no bruit.  JVD: flat.  HEART SOUNDS: regular, normal S1, S2, no S3 or S4.  MURMUR: absent.  LUNGS: no rales or wheezes.  ABDOMEN: soft, non tender, positive bowel sounds, .  EXTREMITIES: trace pretibial leg edema, & ankles right >   left.  PERIPHERAL PULSES: 2 plus bilateral.            Assessment:    Assessment:  1. Abnormal nuclear stress test - 794.39 (Primary), will proceed with cardiac cath   2. Hypertension - 401.9     Plan:    1. Abnormal nuclear stress test        LAB: Basic Metabolic      GLUCOSE 150 70-99 - mg/dL H       BUN 12 6-26 - mg/dL        CREATININE 0.71 0.60-1.30 - mg/dl        eGFR (NON-AFRICAN AMERICAN) 84 >60 - calc        eGFR (AFRICAN AMERICAN) 102 >60 - calc        SODIUM 141 136-145 - mmol/L        POTASSIUM 3.7 3.5-5.5 - mmol/L        CHLORIDE 107 98-107 - mmol/L        C02 25 22-32 - mg/dL        ANION GAP 12.7 6.0-20.0 - mmol/L        CALCIUM 9.6 8.6-10.3 - mg/dL               FERGUSON,CYNTHIA A 05/07/2012 05:26:42 PM > ok for cath, please have sent to pt's PCP related to her glucose level  Plummer,Wanda 05/11/2012 12:30:32 PM > lmtc        LAB: PT and PTT ([id number])     aPTT 27 24-33 - SEC        INR 0.9 0.8-1.2 -        Prothrombin Time 10.0 9.1-12.0 - SEC               FERGUSON,CYNTHIA A 05/09/2012 05:28:35 PM > ok for cath   will proceed with cardiac catheterization, , Risks and benefits of cardiac catheterization have been reviewed including risk of stroke, heart attack, death, bleeding, renal impariment and arterial damage. There was ample oppurtuny to answer questions. Alternatives were discussed. Patient understands and wishes to proceed.       2. Hypertension  Continue Aspirin Tablet, 325 MG, 1 tablet, Orally, Once a day ;  Increase Metoprolol Tartrate Tablet, 25 MG, 1/2 tab, Orally, Twice a day .    explained to pt the need for low sodium diet and weight loss and that metoprolol tartrate needs to be taken BID dosing, over time we may need to further increase HTN therapy, and will start by increasing Metoprolol.       3. Others   Continue GlyBURIDE Tablet, 5 MG, 1 tablet, Orally, Once a day ;  Continue Metformin HCl Tablet, 1000 MG, 1 tablet, Orally, BID .          Immunizations:       Labs:      Procedure Codes: 80048 ECL BMP, 36415 BLOOD COLLECTION ROUTINE VENIPUNCTURE     Preventive:           Follow Up: MS pending cath (Reason: Cardiac cath )        Provider: Cynthia A. Ferguson, NP  Patient: Abigail Collins  DOB: 05/26/1951  Date: 05/07/2012   

## 2012-05-13 NOTE — CV Procedure (Signed)
PROCEDURE:  Left heart catheterization with selective coronary angiography, left ventriculogram.  INDICATIONS:  61 year old female with abnormal nuclear stress test showing possible ischemia in the anterolateral distribution, obesity, recent ER evaluation for chest pain.  The risks, benefits, and details of the procedure were explained to the patient.  The patient verbalized understanding and wanted to proceed.  Informed written consent was obtained.  PROCEDURE TECHNIQUE:  After Xylocaine anesthesia and visualization of the femoral head via fluoroscopy, a 77F sheath was placed in the right femoral artery with a single anterior needle wall stick. The initial puncture was slightly challenging due to increase subcutaneous tissue.  Left coronary angiography was done using a Judkins L4 catheter.  Right coronary angiography was done using a Judkins R4 catheter.  Left ventriculography was done using a pigtail catheter.    CONTRAST:  Total of 60 ml.  FLOUROSCOPY TIME: 1.6 minutes.   COMPLICATIONS:  None.    HEMODYNAMICS:  Aortic pressure was 114/70/90 mmHg; LV systolic pressure was 114/7/10 mmHg; LVEDP 10 mmHg.  There was no gradient between the left ventricle and aorta.    ANGIOGRAPHIC DATA:    Left main: No angiographically significant coronary artery disease. Long left main artery.  Left anterior descending (LAD): There is minor narrowing/luminal irregularity of the proximal segment of the LAD, no flow limitation present. 2 diagonal branches.  Circumflex artery (CIRC): Large branch with high obtuse marginal takeoff, distal obtuse marginal branch. No luminal irregularities.  Right coronary artery (RCA):  Dominant artery giving rise to posterior descending artery. No angiographically significant disease.  LEFT VENTRICULOGRAM:  Left ventricular angiogram was done in the 30 RAO projection and revealed normal left ventricular wall motion and systolic function with an estimated ejection fraction of 65  %.   IMPRESSIONS:  1. No angiographically significant coronary artery disease. Minor luminal irregularity of the proximal LAD segment but no flow limitation. 2. Normal left ventricular systolic function.  LVEDP 10 mmHg.  Ejection fraction 65%.  RECOMMENDATION:  Continue with medical management. Reassurance. Continue with risk factor modification.Marland Kitchen

## 2012-05-13 NOTE — Progress Notes (Signed)
Bedrest begins @ 0930.  Tegaderm dressing applied to right groin site by Venda Rodes.  Right groin site level 0.  Dr. Anne Fu in to discuss results with patient and family.

## 2012-05-14 ENCOUNTER — Encounter (HOSPITAL_BASED_OUTPATIENT_CLINIC_OR_DEPARTMENT_OTHER): Payer: Self-pay

## 2012-06-18 ENCOUNTER — Telehealth: Payer: Self-pay | Admitting: Internal Medicine

## 2012-06-18 NOTE — Telephone Encounter (Signed)
Message copied by COUSIN, Daiva Eves on Fri Jun 18, 2012  2:47 PM ------      Message from: Gordy Savers      Created: Fri Jun 18, 2012  1:35 PM      Regarding: RE: PT DESIRE SWITCH FROM [facility]/DR KWIATKOWSKI TO [facility] W/DR Yetta Barre       ok      ----- Message -----         From: Domenica Reamer         Sent: 06/18/2012  12:59 PM           To: Sharlyne Cai, MD, #      Subject: PT DESIRE SWITCH FROM [facility]/DR [facility]#            Dr Amador Cunas and Dr Stark Jock of [facility] is closer for the pt, she desire to switch to Dr Yetta Barre.   Thank you both for your reply.

## 2012-06-18 NOTE — Telephone Encounter (Signed)
PT IS AWARE OF SWITCH/PHONE---

## 2012-06-18 NOTE — Telephone Encounter (Signed)
Message copied by COUSIN, Daiva Eves on Fri Jun 18, 2012  2:45 PM ------      Message from: [name]      Created: Fri Jun 18, 2012  1:07 PM      Regarding: RE: PT DESIRE SWITCH FROM [facility]OWSKI TO [facility] W/DR Yetta Barre       Fine with me, TJ      ----- Message -----         From: Domenica Reamer         Sent: 06/18/2012  12:59 PM           To: Sharlyne Cai, MD, #      Subject: PT DESIRE SWITCH FROM [facility]#            Dr Amador Cunas and Dr Stark Jock of [facility] is closer for the pt, she desire to switch to Dr Yetta Barre.   Thank you both for your reply.

## 2012-08-05 ENCOUNTER — Ambulatory Visit (INDEPENDENT_AMBULATORY_CARE_PROVIDER_SITE_OTHER)
Admission: RE | Admit: 2012-08-05 | Discharge: 2012-08-05 | Disposition: A | Discharge status: Home / Self Care | Payer: BC Managed Care – PPO | Source: Ambulatory Visit | Attending: Internal Medicine | Admitting: Internal Medicine

## 2012-08-05 ENCOUNTER — Other Ambulatory Visit (INDEPENDENT_AMBULATORY_CARE_PROVIDER_SITE_OTHER): Payer: BC Managed Care – PPO

## 2012-08-05 ENCOUNTER — Ambulatory Visit (INDEPENDENT_AMBULATORY_CARE_PROVIDER_SITE_OTHER): Payer: BC Managed Care – PPO | Admitting: Internal Medicine

## 2012-08-05 ENCOUNTER — Encounter: Payer: Self-pay | Admitting: Internal Medicine

## 2012-08-05 VITALS — BP 118/84 | HR 85 | Temp 97.3°F | Resp 16 | Ht 64.0 in | Wt 244.5 lb

## 2012-08-05 DIAGNOSIS — Z23 Encounter for immunization: Secondary | ICD-10-CM

## 2012-08-05 DIAGNOSIS — I10 Essential (primary) hypertension: Secondary | ICD-10-CM

## 2012-08-05 DIAGNOSIS — M1711 Unilateral primary osteoarthritis, right knee: Secondary | ICD-10-CM

## 2012-08-05 DIAGNOSIS — IMO0002 Reserved for concepts with insufficient information to code with codable children: Secondary | ICD-10-CM

## 2012-08-05 DIAGNOSIS — R209 Unspecified disturbances of skin sensation: Secondary | ICD-10-CM

## 2012-08-05 DIAGNOSIS — J209 Acute bronchitis, unspecified: Secondary | ICD-10-CM

## 2012-08-05 DIAGNOSIS — E1149 Type 2 diabetes mellitus with other diabetic neurological complication: Secondary | ICD-10-CM

## 2012-08-05 DIAGNOSIS — R059 Cough, unspecified: Secondary | ICD-10-CM

## 2012-08-05 DIAGNOSIS — R05 Cough: Secondary | ICD-10-CM

## 2012-08-05 DIAGNOSIS — Z1231 Encounter for screening mammogram for malignant neoplasm of breast: Secondary | ICD-10-CM

## 2012-08-05 DIAGNOSIS — J45909 Unspecified asthma, uncomplicated: Secondary | ICD-10-CM | POA: Insufficient documentation

## 2012-08-05 DIAGNOSIS — E1165 Type 2 diabetes mellitus with hyperglycemia: Secondary | ICD-10-CM

## 2012-08-05 DIAGNOSIS — E1142 Type 2 diabetes mellitus with diabetic polyneuropathy: Secondary | ICD-10-CM

## 2012-08-05 DIAGNOSIS — R202 Paresthesia of skin: Secondary | ICD-10-CM

## 2012-08-05 DIAGNOSIS — E1143 Type 2 diabetes mellitus with diabetic autonomic (poly)neuropathy: Secondary | ICD-10-CM

## 2012-08-05 DIAGNOSIS — R9439 Abnormal result of other cardiovascular function study: Secondary | ICD-10-CM

## 2012-08-05 DIAGNOSIS — E781 Pure hyperglyceridemia: Secondary | ICD-10-CM

## 2012-08-05 DIAGNOSIS — K3184 Gastroparesis: Secondary | ICD-10-CM

## 2012-08-05 DIAGNOSIS — M1710 Unilateral primary osteoarthritis, unspecified knee: Secondary | ICD-10-CM

## 2012-08-05 LAB — CBC WITH DIFFERENTIAL/PLATELET
Basophils Absolute: 0.1 10*3/uL (ref 0.0–0.1)
Basophils Relative: 0.6 % (ref 0.0–3.0)
Eosinophils Absolute: 0.3 10*3/uL (ref 0.0–0.7)
Eosinophils Relative: 2.7 % (ref 0.0–5.0)
HCT: 45.9 % (ref 36.0–46.0)
Hemoglobin: 15.2 g/dL — ABNORMAL HIGH (ref 12.0–15.0)
Lymphocytes Relative: 28.2 % (ref 12.0–46.0)
Lymphs Abs: 3.2 10*3/uL (ref 0.7–4.0)
MCHC: 33.1 g/dL (ref 30.0–36.0)
MCV: 93.5 fl (ref 78.0–100.0)
Monocytes Absolute: 0.6 10*3/uL (ref 0.1–1.0)
Monocytes Relative: 5.1 % (ref 3.0–12.0)
Neutro Abs: 7.1 10*3/uL (ref 1.4–7.7)
Neutrophils Relative %: 63.4 % (ref 43.0–77.0)
Platelets: 184 10*3/uL (ref 150.0–400.0)
RBC: 4.91 Mil/uL (ref 3.87–5.11)
RDW: 12.6 % (ref 11.5–14.6)
WBC: 11.2 10*3/uL — ABNORMAL HIGH (ref 4.5–10.5)

## 2012-08-05 LAB — MICROALBUMIN / CREATININE URINE RATIO
Creatinine,U: 83.1 mg/dL
Microalb Creat Ratio: 3.6 mg/g (ref 0.0–30.0)
Microalb, Ur: 3 mg/dL — ABNORMAL HIGH (ref 0.0–1.9)

## 2012-08-05 LAB — HM DIABETES FOOT EXAM

## 2012-08-05 LAB — URINALYSIS, ROUTINE W REFLEX MICROSCOPIC
Bilirubin Urine: NEGATIVE
Ketones, ur: NEGATIVE
Nitrite: POSITIVE
Specific Gravity, Urine: 1.025 (ref 1.000–1.030)
Total Protein, Urine: NEGATIVE
Urine Glucose: NEGATIVE
Urobilinogen, UA: 0.2 (ref 0.0–1.0)
pH: 5.5 (ref 5.0–8.0)

## 2012-08-05 LAB — HEMOGLOBIN A1C: Hgb A1c MFr Bld: 9 % — ABNORMAL HIGH (ref 4.6–6.5)

## 2012-08-05 LAB — GLUCOSE, POCT (MANUAL RESULT ENTRY): POC Glucose: 213 mg/dl — AB (ref 70–99)

## 2012-08-05 MED ORDER — LIRAGLUTIDE 18 MG/3ML ~~LOC~~ SOLN: 1.8000 mg | Freq: Every day | SUBCUTANEOUS | Status: DC

## 2012-08-05 MED ORDER — FLUCONAZOLE 150 MG PO TABS: 150.0000 mg | ORAL_TABLET | Freq: Once | ORAL | Status: DC

## 2012-08-05 MED ORDER — CEFUROXIME AXETIL 500 MG PO TABS: 500.0000 mg | ORAL_TABLET | Freq: Two times a day (BID) | ORAL | Status: DC

## 2012-08-05 MED ORDER — LANCETS MISC: Status: DC

## 2012-08-05 NOTE — Assessment & Plan Note (Signed)
GI referral, she needs a colonoscopy and may need upper endoscopy

## 2012-08-05 NOTE — Assessment & Plan Note (Signed)
Her BP is well controlled, I will check her a/c ratio and will consider starting an ARB

## 2012-08-05 NOTE — Progress Notes (Signed)
Subjective:    Patient ID: Abigail Collins, female    DOB: [birth year], 61 y.o.   MRN: [id number]  Cough This is a new problem. The current episode started 1 to 4 weeks ago. The problem has been gradually worsening. The problem occurs every few hours. The cough is productive of purulent sputum. Associated symptoms include chills. Pertinent negatives include no chest pain, ear congestion, ear pain, fever, headaches, heartburn, hemoptysis, myalgias, nasal congestion, postnasal drip, rash, rhinorrhea, sore throat, shortness of breath, sweats, weight loss or wheezing. She has tried nothing for the symptoms. Her past medical history is significant for bronchitis.  Diabetes She presents for her follow-up diabetic visit. She has type 2 diabetes mellitus. The initial diagnosis of diabetes was made 15 years ago. Her disease course has been worsening. Pertinent negatives for hypoglycemia include no dizziness, headaches, pallor, seizures, speech difficulty, sweats or tremors. Associated symptoms include fatigue, foot paresthesias, polydipsia, polyphagia and polyuria. Pertinent negatives for diabetes include no blurred vision, no chest pain, no foot ulcerations, no visual change, no weakness and no weight loss. There are no hypoglycemic complications. Symptoms are worsening. Diabetic complications include autonomic neuropathy and peripheral neuropathy. Current diabetic treatment includes oral agent (dual therapy). She is compliant with treatment most of the time. Her weight is stable. She is following a generally unhealthy diet. When asked about meal planning, she reported none. She has not had a previous visit with a dietician. She never participates in exercise. Her home blood glucose trend is increasing steadily. Her breakfast blood glucose range is generally >200 mg/dl. Her lunch blood glucose range is generally >200 mg/dl. Her dinner blood glucose range is generally >200 mg/dl. Her highest blood glucose is >200  mg/dl. Her overall blood glucose range is >200 mg/dl. An ACE inhibitor/angiotensin II receptor blocker is not being taken. She does not see a podiatrist.Eye exam is not current.      Review of Systems  Constitutional: Positive for chills and fatigue. Negative for fever, weight loss, diaphoresis, activity change, appetite change and unexpected weight change.  HENT: Negative.  Negative for ear pain, sore throat, rhinorrhea and postnasal drip.   Eyes: Negative.  Negative for blurred vision.  Respiratory: Positive for cough. Negative for apnea, hemoptysis, choking, chest tightness, shortness of breath, wheezing and stridor.   Cardiovascular: Negative for chest pain, palpitations and leg swelling.  Gastrointestinal: Positive for nausea (in the morning for years) and vomiting (a few times every morning for years). Negative for heartburn, abdominal pain, diarrhea, constipation, blood in stool, abdominal distention, anal bleeding and rectal pain.  Genitourinary: Positive for polyuria.  Musculoskeletal: Positive for arthralgias (knees). Negative for myalgias, back pain, joint swelling and gait problem.  Skin: Negative for color change, pallor, rash and wound.  Neurological: Positive for numbness (and tingling in her feet). Negative for dizziness, tremors, seizures, syncope, facial asymmetry, speech difficulty, weakness, light-headedness and headaches.  Hematological: Positive for polydipsia and polyphagia. Does not bruise/bleed easily.  Psychiatric/Behavioral: Negative.        Objective:   Physical Exam  Vitals reviewed. Constitutional: She is oriented to person, place, and time. She appears well-developed and well-nourished.  Non-toxic appearance. She does not have a sickly appearance. She does not appear ill. No distress.  HENT:  Head: Normocephalic and atraumatic.  Mouth/Throat: Oropharynx is clear and moist. No oropharyngeal exudate.  Eyes: Conjunctivae normal are normal. Right eye exhibits no  discharge. Left eye exhibits no discharge. No scleral icterus.  Neck: Normal range of motion. Neck supple.  No JVD present. No tracheal deviation present. No thyromegaly present.  Cardiovascular: Normal rate, regular rhythm, normal heart sounds and intact distal pulses.  Exam reveals no gallop and no friction rub.   No murmur heard. Pulmonary/Chest: Effort normal and breath sounds normal. No accessory muscle usage or stridor. Not tachypneic. No respiratory distress. She has no decreased breath sounds. She has no wheezes. She has no rhonchi. She has no rales. She exhibits no tenderness.  Abdominal: Soft. Bowel sounds are normal. She exhibits no distension and no mass. There is no tenderness. There is no rebound and no guarding.  Musculoskeletal: Normal range of motion. She exhibits no edema and no tenderness.  Lymphadenopathy:    She has no cervical adenopathy.  Neurological: She is alert and oriented to person, place, and time. She has normal strength. She displays abnormal reflex. She displays no atrophy and no tremor. No cranial nerve deficit or sensory deficit. She exhibits normal muscle tone. She displays a negative Romberg sign. She displays no seizure activity. Coordination and gait normal. She displays no Babinski's sign on the right side. She displays no Babinski's sign on the left side.  Reflex Scores:      Tricep reflexes are 1+ on the right side and 1+ on the left side.      Bicep reflexes are 1+ on the right side and 1+ on the left side.      Brachioradialis reflexes are 1+ on the right side and 1+ on the left side.      Patellar reflexes are 0 on the right side and 0 on the left side.      Achilles reflexes are 0 on the right side and 0 on the left side. Skin: Skin is warm and dry. No rash noted. She is not diaphoretic. No erythema. No pallor.  Psychiatric: She has a normal mood and affect. Her behavior is normal. Judgment and thought content normal.      Lab Results  Component  Value Date   WBC 9.3 04/06/2012   HGB 14.7 04/06/2012   HCT 44.6 04/06/2012   PLT 177 04/06/2012   GLUCOSE 231* 05/13/2012   CHOL 166 04/06/2012   TRIG 413* 04/06/2012   HDL 49 04/06/2012   LDLCALC UNABLE TO CALCULATE IF TRIGLYCERIDE OVER 400 mg/dL 10/15/7251   ALT 24 6/64/4034   AST 28 04/06/2012   NA 134* 04/06/2012   K 3.9 04/06/2012   CL 98 04/06/2012   CREATININE 0.72 04/06/2012   BUN 19 04/06/2012   CO2 24 04/06/2012   TSH 3.158 04/05/2012   INR 0.96 04/06/2012   HGBA1C 8.8* 04/05/2012      Assessment & Plan:

## 2012-08-05 NOTE — Assessment & Plan Note (Signed)
She is planning on a R TKR soon, I sent a note to Dr. Valentina Gu today for surgical clearance

## 2012-08-05 NOTE — Assessment & Plan Note (Signed)
Start ceftin for the infection 

## 2012-08-05 NOTE — Assessment & Plan Note (Signed)
I will check her a1c and her renal function, I have asked her to stop the SU as it may be causing some of her s/s, she will continue with metformin and will start victoza

## 2012-08-05 NOTE — Assessment & Plan Note (Signed)
This is most likely caused by years of poorly controlled DM, I will check her labs today to screen her for secondary causes (thyroid dz., B12 defic, abnormal lytes, anemia/WBC dyscrasia, etc)

## 2012-08-05 NOTE — Assessment & Plan Note (Signed)
FLP today 

## 2012-08-05 NOTE — Patient Instructions (Signed)
Acute Bronchitis You have acute bronchitis. This means you have a chest cold. The airways in your lungs are red and sore (inflamed). Acute means it is sudden onset.  CAUSES Bronchitis is most often caused by the same virus that causes a cold. SYMPTOMS   Body aches.  Chest congestion.  Chills.  Cough.  Fever.  Shortness of breath.  Sore throat. TREATMENT  Acute bronchitis is usually treated with rest, fluids, and medicines for relief of fever or cough. Most symptoms should go away after a few days or a week. Increased fluids may help thin your secretions and will prevent dehydration. Your caregiver may give you an inhaler to improve your symptoms. The inhaler reduces shortness of breath and helps control cough. You can take over-the-counter pain relievers or cough medicine to decrease coughing, pain, or fever. A cool-air vaporizer may help thin bronchial secretions and make it easier to clear your chest. Antibiotics are usually not needed but can be prescribed if you smoke, are seriously ill, have chronic lung problems, are elderly, or you are at higher risk for developing complications.Allergies and asthma can make bronchitis worse. Repeated episodes of bronchitis may cause longstanding lung problems. Avoid smoking and secondhand smoke.Exposure to cigarette smoke or irritating chemicals will make bronchitis worse. If you are a cigarette smoker, consider using nicotine gum or skin patches to help control withdrawal symptoms. Quitting smoking will help your lungs heal faster. Recovery from bronchitis is often slow, but you should start feeling better after 2 to 3 days. Cough from bronchitis frequently lasts for 3 to 4 weeks. To prevent another bout of acute bronchitis:  Quit smoking.  Wash your hands frequently to get rid of viruses or use a hand sanitizer.  Avoid other people with cold or virus symptoms.  Try not to touch your hands to your mouth, nose, or eyes. SEEK IMMEDIATE  MEDICAL CARE IF:  You develop increased fever, chills, or chest pain.  You have severe shortness of breath or bloody sputum.  You develop dehydration, fainting, repeated vomiting, or a severe headache.  You have no improvement after 1 week of treatment or you get worse. MAKE SURE YOU:   Understand these instructions.  Will watch your condition.  Will get help right away if you are not doing well or get worse. Document Released: 11/06/2004 Document Revised: 12/22/2011 Document Reviewed: 01/22/2011 [facility] Patient Information 2013 [place], [place]. Diabetes, Type 2 Diabetes is a long-lasting (chronic) disease. In type 2 diabetes, the pancreas does not make enough insulin (a hormone), and the body does not respond normally to the insulin that is made. This type of diabetes was also previously called adult-onset diabetes. It usually occurs after the age of 78, but it can occur at any age.  CAUSES  Type 2 diabetes happens because the pancreasis not making enough insulin or your body has trouble using the insulin that your pancreas does make properly. SYMPTOMS   Drinking more than usual.  Urinating more than usual.  Blurred vision.  Dry, itchy skin.  Frequent infections.  Feeling more tired than usual (fatigue). DIAGNOSIS The diagnosis of type 2 diabetes is usually made by one of the following tests:  Fasting blood glucose test. You will not eat for at least 8 hours and then take a blood test.  Random blood glucose test. Your blood glucose (sugar) is checked at any time of the day regardless of when you ate.  Oral glucose tolerance test (OGTT). Your blood glucose is measured after you have  not eaten (fasted) and then after you drink a glucose containing beverage. TREATMENT   Healthy eating.  Exercise.  Medicine, if needed.  Monitoring blood glucose.  Seeing your caregiver regularly. HOME CARE INSTRUCTIONS   Check your blood glucose at least once a day. More frequent  monitoring may be necessary, depending on your medicines and on how well your diabetes is controlled. Your caregiver will advise you.  Take your medicine as directed by your caregiver.  Do not smoke.  Make wise food choices. Ask your caregiver for information. Weight loss can improve your diabetes.  Learn about low blood glucose (hypoglycemia) and how to treat it.  Get your eyes checked regularly.  Have a yearly physical exam. Have your blood pressure checked and your blood and urine tested.  Wear a pendant or bracelet saying that you have diabetes.  Check your feet every night for cuts, sores, blisters, and redness. Let your caregiver know if you have any problems. SEEK MEDICAL CARE IF:   You have problems keeping your blood glucose in target range.  You have problems with your medicines.  You have symptoms of an illness that do not improve after 24 hours.  You have a sore or wound that is not healing.  You notice a change in vision or a new problem with your vision.  You have a fever. MAKE SURE YOU:  Understand these instructions.  Will watch your condition.  Will get help right away if you are not doing well or get worse. Document Released: 09/29/2005 Document Revised: 12/22/2011 Document Reviewed: 03/17/2011 [facility] Patient Information 2013 [place], [place].

## 2012-08-05 NOTE — Assessment & Plan Note (Signed)
I will check her CXR to see if she has a mass, pna, etc.

## 2012-08-05 NOTE — Assessment & Plan Note (Signed)
She sees an [place] cardiologist about this

## 2012-08-06 ENCOUNTER — Encounter: Payer: Self-pay | Admitting: Internal Medicine

## 2012-08-06 ENCOUNTER — Other Ambulatory Visit: Payer: Self-pay | Admitting: *Deleted

## 2012-08-06 LAB — COMPREHENSIVE METABOLIC PANEL
ALT: 28 U/L (ref 0–35)
AST: 33 U/L (ref 0–37)
Albumin: 3.7 g/dL (ref 3.5–5.2)
Alkaline Phosphatase: 89 U/L (ref 39–117)
BUN: 18 mg/dL (ref 6–23)
CO2: 22 mEq/L (ref 19–32)
Calcium: 9.3 mg/dL (ref 8.4–10.5)
Chloride: 105 mEq/L (ref 96–112)
Creatinine, Ser: 0.8 mg/dL (ref 0.4–1.2)
GFR: 77.65 mL/min (ref >60.00)
Glucose, Bld: 185 mg/dL — ABNORMAL HIGH (ref 70–99)
Potassium: 4.3 mEq/L (ref 3.5–5.1)
Sodium: 137 mEq/L (ref 135–145)
Total Bilirubin: 0.7 mg/dL (ref 0.3–1.2)
Total Protein: 7.6 g/dL (ref 6.0–8.3)

## 2012-08-06 LAB — FOLATE: Folate: 9.7 ng/mL (ref >5.9)

## 2012-08-06 LAB — LIPID PANEL
Cholesterol: 172 mg/dL (ref 0–200)
HDL: 43.6 mg/dL (ref >39.00)
Total CHOL/HDL Ratio: 4
Triglycerides: 248 mg/dL — ABNORMAL HIGH (ref 0.0–149.0)
VLDL: 49.6 mg/dL — ABNORMAL HIGH (ref 0.0–40.0)

## 2012-08-06 LAB — LDL CHOLESTEROL, DIRECT: Direct LDL: 92.7 mg/dL

## 2012-08-06 LAB — TSH: TSH: 2.6 u[IU]/mL (ref 0.35–5.50)

## 2012-08-06 LAB — VITAMIN B12: Vitamin B-12: 279 pg/mL (ref 211–911)

## 2012-08-06 LAB — C-PEPTIDE: C-Peptide: 4.96 ng/mL — ABNORMAL HIGH (ref 0.80–3.90)

## 2012-08-06 MED ORDER — INSULIN PEN NEEDLE 31G X 8 MM MISC: Status: DC

## 2012-08-06 NOTE — Telephone Encounter (Signed)
R'cd fax from [facility] stating that pt's needs rx for pen needles for Victoza. Rx sent.

## 2012-08-20 ENCOUNTER — Ambulatory Visit (INDEPENDENT_AMBULATORY_CARE_PROVIDER_SITE_OTHER): Payer: BC Managed Care – PPO | Admitting: Internal Medicine

## 2012-08-20 ENCOUNTER — Encounter: Payer: Self-pay | Admitting: Internal Medicine

## 2012-08-20 ENCOUNTER — Ambulatory Visit: Payer: BC Managed Care – PPO | Admitting: Endocrinology

## 2012-08-20 VITALS — BP 124/78 | HR 98 | Temp 98.0°F | Resp 16 | Wt 242.0 lb

## 2012-08-20 DIAGNOSIS — J45901 Unspecified asthma with (acute) exacerbation: Secondary | ICD-10-CM

## 2012-08-20 DIAGNOSIS — J45909 Unspecified asthma, uncomplicated: Secondary | ICD-10-CM

## 2012-08-20 MED ORDER — HYDROCOD POLST-CPM POLST ER 10-8 MG PO CP12: 1.0000 | ORAL_CAPSULE | Freq: Two times a day (BID) | ORAL | Status: DC | PRN

## 2012-08-20 MED ORDER — MOMETASONE FURO-FORMOTEROL FUM 100-5 MCG/ACT IN AERO: 2.0000 | INHALATION_SPRAY | Freq: Two times a day (BID) | RESPIRATORY_TRACT | Status: DC

## 2012-08-20 MED ORDER — METHYLPREDNISOLONE ACETATE 80 MG/ML IJ SUSP: 120.0000 mg | Freq: Once | INTRAMUSCULAR | Status: DC

## 2012-08-20 NOTE — Patient Instructions (Signed)

## 2012-08-20 NOTE — Assessment & Plan Note (Signed)
She has had some relief with SABA I will add a LABA and ICS She was given an injection of depo-medrol today to help reduce the inflammation in her lungs and control her symptoms

## 2012-08-20 NOTE — Progress Notes (Signed)
  Subjective:    Patient ID: Abigail Collins, female    DOB: [birth year], 61 y.o.   MRN: [id number]  Cough This is a recurrent problem. The current episode started 1 to 4 weeks ago. The problem has been unchanged. The problem occurs every few hours. The cough is productive of sputum. Associated symptoms include shortness of breath and wheezing. Pertinent negatives include no chest pain, chills, ear congestion, ear pain, fever, headaches, heartburn, hemoptysis, myalgias, nasal congestion, postnasal drip, rash, rhinorrhea, sore throat or weight loss. The symptoms are aggravated by cold air. She has tried a beta-agonist inhaler for the symptoms. The treatment provided moderate relief. Her past medical history is significant for bronchitis.      Review of Systems  Constitutional: Positive for fatigue. Negative for fever, chills, weight loss, diaphoresis, activity change, appetite change and unexpected weight change.  HENT: Negative.  Negative for ear pain, sore throat, rhinorrhea and postnasal drip.   Eyes: Negative.   Respiratory: Positive for cough, shortness of breath and wheezing. Negative for apnea, hemoptysis, choking, chest tightness and stridor.   Cardiovascular: Negative for chest pain, palpitations and leg swelling.  Gastrointestinal: Negative.  Negative for heartburn.  Genitourinary: Negative.   Musculoskeletal: Negative for myalgias, back pain, joint swelling, arthralgias and gait problem.  Skin: Negative for color change, pallor, rash and wound.  Neurological: Negative.  Negative for headaches.  Hematological: Negative for adenopathy. Does not bruise/bleed easily.  Psychiatric/Behavioral: Negative.        Objective:   Physical Exam  Vitals reviewed. Constitutional: She is oriented to person, place, and time. She appears well-developed and well-nourished.  Non-toxic appearance. She does not have a sickly appearance. She does not appear ill. No distress.  HENT:  Head:  Normocephalic and atraumatic.  Mouth/Throat: Oropharynx is clear and moist. No oropharyngeal exudate.  Eyes: Conjunctivae normal are normal. Right eye exhibits no discharge. Left eye exhibits no discharge. No scleral icterus.  Neck: Normal range of motion. Neck supple. No JVD present. No tracheal deviation present. No thyromegaly present.  Cardiovascular: Normal rate, regular rhythm, normal heart sounds and intact distal pulses.  Exam reveals no gallop and no friction rub.   No murmur heard. Pulmonary/Chest: Effort normal. No accessory muscle usage or stridor. Not tachypneic. No respiratory distress. She has no decreased breath sounds. She has no wheezes. She has rhonchi in the right lower field and the left lower field. She has no rales. She exhibits no tenderness.  Abdominal: Soft. She exhibits no distension and no mass. There is no tenderness. There is no rebound and no guarding.  Musculoskeletal: Normal range of motion. She exhibits no edema and no tenderness.  Lymphadenopathy:    She has no cervical adenopathy.  Neurological: She is oriented to person, place, and time.  Skin: Skin is warm and dry. No rash noted. She is not diaphoretic. No erythema. No pallor.  Psychiatric: She has a normal mood and affect. Her behavior is normal. Judgment and thought content normal.      Dg Chest 2 View  08/05/2012  *RADIOLOGY REPORT*  Clinical Data: Cough, wheezing  CHEST - 2 VIEW  Comparison: 04/05/2012  Findings: Cardiomediastinal silhouette is stable.  No acute infiltrate or pleural effusion.  No pulmonary edema.  Stable chronic central mild bronchitic changes.  IMPRESSION: No active disease.  No significant change.   Original Report Authenticated By: Natasha Mead, M.D.      Assessment & Plan:

## 2012-08-27 ENCOUNTER — Ambulatory Visit: Payer: BC Managed Care – PPO | Admitting: Internal Medicine

## 2012-09-03 ENCOUNTER — Other Ambulatory Visit: Payer: Self-pay | Admitting: Orthopedic Surgery

## 2012-09-03 ENCOUNTER — Encounter (HOSPITAL_COMMUNITY): Payer: Self-pay

## 2012-09-03 ENCOUNTER — Encounter (HOSPITAL_COMMUNITY): Payer: Self-pay | Admitting: Pharmacy Technician

## 2012-09-03 ENCOUNTER — Encounter (HOSPITAL_COMMUNITY)
Admission: RE | Admit: 2012-09-03 | Discharge: 2012-09-03 | Disposition: A | Discharge status: Home / Self Care | Payer: BC Managed Care – PPO | Source: Ambulatory Visit | Attending: Orthopedic Surgery | Admitting: Orthopedic Surgery

## 2012-09-03 HISTORY — DX: Unspecified asthma, uncomplicated: J45.909

## 2012-09-03 HISTORY — DX: Hyperlipidemia, unspecified: E78.5

## 2012-09-03 HISTORY — DX: Anemia, unspecified: D64.9

## 2012-09-03 HISTORY — DX: Angina pectoris, unspecified: I20.9

## 2012-09-03 HISTORY — DX: Urinary tract infection, site not specified: N39.0

## 2012-09-03 HISTORY — DX: Bronchitis, not specified as acute or chronic: J40

## 2012-09-03 LAB — URINALYSIS, ROUTINE W REFLEX MICROSCOPIC
Bilirubin Urine: NEGATIVE
Glucose, UA: 100 mg/dL — AB
Hgb urine dipstick: NEGATIVE
Ketones, ur: NEGATIVE mg/dL
Leukocytes, UA: NEGATIVE
Nitrite: NEGATIVE
Protein, ur: NEGATIVE mg/dL
Specific Gravity, Urine: 1.026 (ref 1.005–1.030)
Urobilinogen, UA: 0.2 mg/dL (ref 0.0–1.0)
pH: 5 (ref 5.0–8.0)

## 2012-09-03 LAB — COMPREHENSIVE METABOLIC PANEL
ALT: 36 U/L — ABNORMAL HIGH (ref 0–35)
AST: 38 U/L — ABNORMAL HIGH (ref 0–37)
Albumin: 4.1 g/dL (ref 3.5–5.2)
Alkaline Phosphatase: 88 U/L (ref 39–117)
BUN: 16 mg/dL (ref 6–23)
CO2: 25 mEq/L (ref 19–32)
Calcium: 9.6 mg/dL (ref 8.4–10.5)
Chloride: 102 mEq/L (ref 96–112)
Creatinine, Ser: 0.65 mg/dL (ref 0.50–1.10)
GFR calc Af Amer: >90 mL/min (ref >90)
GFR calc non Af Amer: >90 mL/min (ref >90)
Glucose, Bld: 165 mg/dL — ABNORMAL HIGH (ref 70–99)
Potassium: 4 mEq/L (ref 3.5–5.1)
Sodium: 140 mEq/L (ref 135–145)
Total Bilirubin: 0.5 mg/dL (ref 0.3–1.2)
Total Protein: 7.8 g/dL (ref 6.0–8.3)

## 2012-09-03 LAB — CBC WITH DIFFERENTIAL/PLATELET
Basophils Absolute: 0 10*3/uL (ref 0.0–0.1)
Basophils Relative: 0 % (ref 0–1)
Eosinophils Absolute: 0.2 10*3/uL (ref 0.0–0.7)
Eosinophils Relative: 2 % (ref 0–5)
HCT: 47.3 % — ABNORMAL HIGH (ref 36.0–46.0)
Hemoglobin: 15.9 g/dL — ABNORMAL HIGH (ref 12.0–15.0)
Lymphocytes Relative: 37 % (ref 12–46)
Lymphs Abs: 3.7 10*3/uL (ref 0.7–4.0)
MCH: 31.5 pg (ref 26.0–34.0)
MCHC: 33.6 g/dL (ref 30.0–36.0)
MCV: 93.8 fL (ref 78.0–100.0)
Monocytes Absolute: 0.5 10*3/uL (ref 0.1–1.0)
Monocytes Relative: 5 % (ref 3–12)
Neutro Abs: 5.7 10*3/uL (ref 1.7–7.7)
Neutrophils Relative %: 56 % (ref 43–77)
Platelets: 190 10*3/uL (ref 150–400)
RBC: 5.04 MIL/uL (ref 3.87–5.11)
RDW: 13 % (ref 11.5–15.5)
WBC: 10.1 10*3/uL (ref 4.0–10.5)

## 2012-09-03 LAB — ABO/RH: ABO/RH(D): O POS

## 2012-09-03 LAB — PROTIME-INR
INR: 0.88 (ref 0.00–1.49)
Prothrombin Time: 11.9 seconds (ref 11.6–15.2)

## 2012-09-03 LAB — SURGICAL PCR SCREEN
MRSA, PCR: NEGATIVE
Staphylococcus aureus: NEGATIVE

## 2012-09-03 LAB — TYPE AND SCREEN
ABO/RH(D): O POS
Antibody Screen: NEGATIVE

## 2012-09-03 LAB — APTT: aPTT: 32 seconds (ref 24–37)

## 2012-09-03 MED ORDER — CHLORHEXIDINE GLUCONATE 4 % EX LIQD: 60.0000 mL | Freq: Once | CUTANEOUS | Status: DC

## 2012-09-03 NOTE — Pre-Procedure Instructions (Signed)
20 Abigail Collins  09/03/2012   Your procedure is scheduled on:  Monday September 13, 2012  Report to [facility] at 5:30 AM.  Call this number if you have problems the morning of surgery: [phone]   Remember:   Do not eat food or drink :After Midnight.      Take these medicines the morning of surgery with A SIP OF WATER: albuterol (as needed, bring day of surgery), Dulera   Do not wear jewelry, make-up or nail polish.  Do not wear lotions, powders, or perfumes.  Do not shave 48 hours prior to surgery. Men may shave face and neck.  Do not bring valuables to the hospital.  Contacts, dentures or bridgework may not be worn into surgery.  Leave suitcase in the car. After surgery it may be brought to your room.  For patients admitted to the hospital, checkout time is 11:00 AM the day of discharge.   Patients discharged the day of surgery will not be allowed to drive home.  Name and phone number of your driver: Emmogene Parrent  Special Instructions: Shower using CHG 2 nights before surgery and the night before surgery.  If you shower the day of surgery use CHG.  Use special wash - you have one bottle of CHG for all showers.  You should use approximately 1/3 of the bottle for each shower.   Please read over the following fact sheets that you were given: Pain Booklet, Coughing and Deep Breathing, Blood Transfusion Information, Total Joint Packet, MRSA Information and Surgical Site Infection Prevention

## 2012-09-05 LAB — URINE CULTURE: Colony Count: >=100000

## 2012-09-06 ENCOUNTER — Encounter (HOSPITAL_COMMUNITY): Payer: Self-pay | Admitting: Vascular Surgery

## 2012-09-06 NOTE — Consult Note (Addendum)
Anesthesia chart review: Patient is a 61 year old female scheduled for right total knee arthroplasty by Dr. Valentina Gu on 09/13/2012. History includes morbid obesity, former smoker, postoperative nausea and vomiting, diabetes mellitus type 2, hyperlipidemia, hypertension, anemia, thyroid disease (not specified), headache, arthritis.  She was evaluated by cardiologist Dr. Anne Fu for chest pain earlier this year and had a mildly abnormal stress test followed by a cardiac cath in August 2013 that showed no significant CAD and PRN cardiology follow-up was recommended.  PCP is Dr. [name].  Cardiac cath on 05/13/2012 showed no angiographically significant coronary artery disease. Moderate luminal irregularity of the proximal LAD segment but no flow limitation. Normal left ventricular systolic function. LVEDP 10 mmHg. Ejection fraction 65%. (This was done following an abnormal stress test on 04/08/12 that showed EF 62%, a mild degree of ischemia cannot be excluded in the basal inferolateral region as well as apex/anteroseptal.)  Echo on 04/06/2012 showed: Left ventricle: The cavity size was normal. There was mild concentric hypertrophy. Systolic function was normal. The estimated ejection fraction was in the range of 55% to 60%. Wall motion was normal; there were no regional wall motion abnormalities. Doppler parameters are consistent with abnormal left ventricular relaxation (grade 1 diastolic dysfunction).   EKG on 04/05/2012 showed sinus tachycardia at 101 bpm, borderline left axis deviation.  Chest x-ray on 08/05/2012 showed no active disease. Stable chronic central mild bronchitic changes.  Labs noted.  Urine culture showed > 100,000 Enterobacter Aerogenes.  I called this report to Sofie at Dr. Tobin Chad office.  If no acute change in her status then anticipate she can proceed from an anesthesia standpoint.  Shonna Chock, PA-C 09/06/12 1154

## 2012-09-10 ENCOUNTER — Ambulatory Visit (INDEPENDENT_AMBULATORY_CARE_PROVIDER_SITE_OTHER): Payer: BC Managed Care – PPO | Admitting: Internal Medicine

## 2012-09-10 ENCOUNTER — Encounter: Payer: Self-pay | Admitting: Internal Medicine

## 2012-09-10 VITALS — BP 134/82 | HR 88 | Temp 97.7°F | Resp 16 | Wt 237.0 lb

## 2012-09-10 DIAGNOSIS — E1142 Type 2 diabetes mellitus with diabetic polyneuropathy: Secondary | ICD-10-CM

## 2012-09-10 DIAGNOSIS — E1165 Type 2 diabetes mellitus with hyperglycemia: Secondary | ICD-10-CM

## 2012-09-10 DIAGNOSIS — I10 Essential (primary) hypertension: Secondary | ICD-10-CM

## 2012-09-10 DIAGNOSIS — IMO0002 Reserved for concepts with insufficient information to code with codable children: Secondary | ICD-10-CM

## 2012-09-10 DIAGNOSIS — E1149 Type 2 diabetes mellitus with other diabetic neurological complication: Secondary | ICD-10-CM

## 2012-09-10 MED ORDER — METFORMIN HCL 1000 MG PO TABS: 1000.0000 mg | ORAL_TABLET | Freq: Two times a day (BID) | ORAL | Status: DC

## 2012-09-10 NOTE — Assessment & Plan Note (Signed)
Her BP is well controlled 

## 2012-09-10 NOTE — Assessment & Plan Note (Signed)
She is doing well on her new regimen

## 2012-09-10 NOTE — Progress Notes (Signed)
Subjective:    Patient ID: Abigail Collins, female    DOB: 07/14/1951, 61 y.o.   MRN: [id number]  Diabetes She presents for her follow-up diabetic visit. She has type 2 diabetes mellitus. Her disease course has been stable. There are no hypoglycemic associated symptoms. Pertinent negatives for diabetes include no blurred vision, no chest pain, no fatigue, no foot paresthesias, no foot ulcerations, no polydipsia, no polyphagia, no polyuria, no visual change, no weakness and no weight loss. There are no hypoglycemic complications. Symptoms are stable. There are no diabetic complications. Current diabetic treatment includes oral agent (dual therapy). Her weight is stable. She is following a generally healthy diet. Meal planning includes avoidance of concentrated sweets. She has not had a previous visit with a dietician. She never participates in exercise. There is no change in her home blood glucose trend. An ACE inhibitor/angiotensin II receptor blocker is not being taken. She does not see a podiatrist.Eye exam is not current.      Review of Systems  Constitutional: Negative for fever, chills, weight loss, diaphoresis, activity change, appetite change, fatigue and unexpected weight change.  HENT: Negative.   Eyes: Negative.  Negative for blurred vision.  Respiratory: Negative for cough, chest tightness, shortness of breath, wheezing and stridor.   Cardiovascular: Negative for chest pain, palpitations and leg swelling.  Gastrointestinal: Negative for nausea, vomiting, abdominal pain, diarrhea and constipation.  Genitourinary: Negative.  Negative for urgency, polyuria, frequency, hematuria, decreased urine volume, enuresis and difficulty urinating.  Musculoskeletal: Positive for arthralgias. Negative for myalgias, back pain, joint swelling and gait problem.  Neurological: Negative.  Negative for weakness.  Hematological: Negative for polydipsia, polyphagia and adenopathy. Does not bruise/bleed  easily.  Psychiatric/Behavioral: Negative.        Objective:   Physical Exam  Vitals reviewed. Constitutional: She is oriented to person, place, and time. She appears well-developed and well-nourished. No distress.  HENT:  Head: Normocephalic and atraumatic.  Mouth/Throat: Oropharynx is clear and moist. No oropharyngeal exudate.  Eyes: Conjunctivae normal are normal. Right eye exhibits no discharge. Left eye exhibits no discharge. No scleral icterus.  Neck: Normal range of motion. Neck supple. No JVD present. No tracheal deviation present. No thyromegaly present.  Cardiovascular: Normal rate, regular rhythm, normal heart sounds and intact distal pulses.  Exam reveals no gallop and no friction rub.   No murmur heard. Pulmonary/Chest: Effort normal and breath sounds normal. No stridor. No respiratory distress. She has no wheezes. She has no rales. She exhibits no tenderness.  Abdominal: Soft. Bowel sounds are normal. She exhibits no distension and no mass. There is no tenderness. There is no rebound and no guarding.  Musculoskeletal: Normal range of motion. She exhibits no edema and no tenderness.  Lymphadenopathy:    She has no cervical adenopathy.  Neurological: She is oriented to person, place, and time.  Skin: Skin is warm and dry. No rash noted. She is not diaphoretic. No erythema. No pallor.  Psychiatric: She has a normal mood and affect. Her behavior is normal. Judgment and thought content normal.     Lab Results  Component Value Date   WBC 10.1 09/03/2012   HGB 15.9* 09/03/2012   HCT 47.3* 09/03/2012   PLT 190 09/03/2012   GLUCOSE 165* 09/03/2012   CHOL 172 08/05/2012   TRIG 248.0* 08/05/2012   HDL 43.60 08/05/2012   LDLDIRECT 92.7 08/05/2012   LDLCALC UNABLE TO CALCULATE IF TRIGLYCERIDE OVER 400 mg/dL 01/19/8118   ALT 36* 14/78/2956   AST 38*  09/03/2012   NA 140 09/03/2012   K 4.0 09/03/2012   CL 102 09/03/2012   CREATININE 0.65 09/03/2012   BUN 16 09/03/2012   CO2  25 09/03/2012   TSH 2.60 08/05/2012   INR 0.88 09/03/2012   HGBA1C 9.0* 08/05/2012   MICROALBUR 3.0* 08/05/2012       Assessment & Plan:

## 2012-09-10 NOTE — Patient Instructions (Signed)

## 2012-09-12 MED ORDER — CEFAZOLIN SODIUM-DEXTROSE 2-3 GM-% IV SOLR
2.0000 g | INTRAVENOUS | Status: AC
  Administered 2012-09-13: 2 g via INTRAVENOUS
  Filled 2012-09-12: qty 50

## 2012-09-13 ENCOUNTER — Inpatient Hospital Stay (HOSPITAL_COMMUNITY)
Admission: RE | Admit: 2012-09-13 | Discharge: 2012-09-15 | DRG: 209 | Disposition: A | Discharge status: Home Health | Payer: BC Managed Care – PPO | Source: Ambulatory Visit | Attending: Orthopedic Surgery | Admitting: Orthopedic Surgery

## 2012-09-13 ENCOUNTER — Ambulatory Visit (HOSPITAL_COMMUNITY): Payer: BC Managed Care – PPO | Admitting: Vascular Surgery

## 2012-09-13 ENCOUNTER — Encounter (HOSPITAL_COMMUNITY)
Admission: RE | Disposition: A | Discharge status: Home Health | Payer: Self-pay | Source: Ambulatory Visit | Attending: Orthopedic Surgery

## 2012-09-13 ENCOUNTER — Encounter (HOSPITAL_COMMUNITY): Payer: Self-pay | Admitting: Vascular Surgery

## 2012-09-13 ENCOUNTER — Encounter (HOSPITAL_COMMUNITY): Payer: Self-pay | Admitting: *Deleted

## 2012-09-13 DIAGNOSIS — Z90710 Acquired absence of both cervix and uterus: Secondary | ICD-10-CM

## 2012-09-13 DIAGNOSIS — IMO0002 Reserved for concepts with insufficient information to code with codable children: Secondary | ICD-10-CM

## 2012-09-13 DIAGNOSIS — M1710 Unilateral primary osteoarthritis, unspecified knee: Principal | ICD-10-CM | POA: Diagnosis present

## 2012-09-13 DIAGNOSIS — M129 Arthropathy, unspecified: Secondary | ICD-10-CM | POA: Diagnosis present

## 2012-09-13 DIAGNOSIS — E785 Hyperlipidemia, unspecified: Secondary | ICD-10-CM | POA: Diagnosis present

## 2012-09-13 DIAGNOSIS — Z79899 Other long term (current) drug therapy: Secondary | ICD-10-CM

## 2012-09-13 DIAGNOSIS — J45909 Unspecified asthma, uncomplicated: Secondary | ICD-10-CM | POA: Diagnosis present

## 2012-09-13 DIAGNOSIS — D62 Acute posthemorrhagic anemia: Secondary | ICD-10-CM | POA: Diagnosis not present

## 2012-09-13 DIAGNOSIS — F329 Major depressive disorder, single episode, unspecified: Secondary | ICD-10-CM | POA: Diagnosis present

## 2012-09-13 DIAGNOSIS — E1165 Type 2 diabetes mellitus with hyperglycemia: Secondary | ICD-10-CM

## 2012-09-13 DIAGNOSIS — E781 Pure hyperglyceridemia: Secondary | ICD-10-CM | POA: Diagnosis present

## 2012-09-13 DIAGNOSIS — F3289 Other specified depressive episodes: Secondary | ICD-10-CM | POA: Diagnosis present

## 2012-09-13 DIAGNOSIS — I10 Essential (primary) hypertension: Secondary | ICD-10-CM | POA: Diagnosis present

## 2012-09-13 DIAGNOSIS — K3184 Gastroparesis: Secondary | ICD-10-CM | POA: Diagnosis present

## 2012-09-13 DIAGNOSIS — K219 Gastro-esophageal reflux disease without esophagitis: Secondary | ICD-10-CM | POA: Diagnosis present

## 2012-09-13 DIAGNOSIS — Z888 Allergy status to other drugs, medicaments and biological substances status: Secondary | ICD-10-CM

## 2012-09-13 DIAGNOSIS — Z9089 Acquired absence of other organs: Secondary | ICD-10-CM

## 2012-09-13 DIAGNOSIS — Z87891 Personal history of nicotine dependence: Secondary | ICD-10-CM

## 2012-09-13 DIAGNOSIS — M1711 Unilateral primary osteoarthritis, right knee: Secondary | ICD-10-CM

## 2012-09-13 DIAGNOSIS — E1149 Type 2 diabetes mellitus with other diabetic neurological complication: Secondary | ICD-10-CM | POA: Diagnosis present

## 2012-09-13 HISTORY — DX: Other forms of dyspnea: R06.09

## 2012-09-13 HISTORY — DX: Pneumonia, unspecified organism: J18.9

## 2012-09-13 HISTORY — DX: Type 2 diabetes mellitus without complications: E11.9

## 2012-09-13 HISTORY — DX: Dyspnea, unspecified: R06.00

## 2012-09-13 HISTORY — PX: TOTAL KNEE ARTHROPLASTY: SHX125

## 2012-09-13 LAB — GLUCOSE, CAPILLARY
Glucose-Capillary: 215 mg/dL — ABNORMAL HIGH (ref 70–99)
Glucose-Capillary: 224 mg/dL — ABNORMAL HIGH (ref 70–99)
Glucose-Capillary: 248 mg/dL — ABNORMAL HIGH (ref 70–99)
Glucose-Capillary: 232 mg/dL — ABNORMAL HIGH (ref 70–99)
Glucose-Capillary: 225 mg/dL — ABNORMAL HIGH (ref 70–99)

## 2012-09-13 LAB — HEMOGLOBIN A1C
Hgb A1c MFr Bld: 8 % — ABNORMAL HIGH (ref <5.7)
Mean Plasma Glucose: 183 mg/dL — ABNORMAL HIGH (ref <117)

## 2012-09-13 SURGERY — ARTHROPLASTY, KNEE, TOTAL
Anesthesia: Regional | Site: Knee | Laterality: Right | Wound class: Clean

## 2012-09-13 MED ORDER — SODIUM CHLORIDE 0.9 % IR SOLN
Status: DC | PRN
  Administered 2012-09-13: 3000 mL

## 2012-09-13 MED ORDER — ACETAMINOPHEN 10 MG/ML IV SOLN
INTRAVENOUS | Status: AC
  Filled 2012-09-13: qty 100

## 2012-09-13 MED ORDER — ONDANSETRON HCL 4 MG/2ML IJ SOLN
4.0000 mg | Freq: Four times a day (QID) | INTRAMUSCULAR | Status: DC | PRN
  Administered 2012-09-13: 4 mg via INTRAVENOUS
  Filled 2012-09-13: qty 2

## 2012-09-13 MED ORDER — ONDANSETRON HCL 4 MG PO TABS: 4.0000 mg | ORAL_TABLET | Freq: Four times a day (QID) | ORAL | Status: DC | PRN

## 2012-09-13 MED ORDER — MIDAZOLAM HCL 5 MG/5ML IJ SOLN
INTRAMUSCULAR | Status: DC | PRN
  Administered 2012-09-13: 2 mg via INTRAVENOUS

## 2012-09-13 MED ORDER — NEOSTIGMINE METHYLSULFATE 1 MG/ML IJ SOLN
INTRAMUSCULAR | Status: DC | PRN
  Administered 2012-09-13: 3 mg via INTRAVENOUS

## 2012-09-13 MED ORDER — LACTATED RINGERS IV SOLN
INTRAVENOUS | Status: DC | PRN
  Administered 2012-09-13 (×2): via INTRAVENOUS

## 2012-09-13 MED ORDER — ACETAMINOPHEN 10 MG/ML IV SOLN: 1000.0000 mg | Freq: Once | INTRAVENOUS | Status: DC | PRN

## 2012-09-13 MED ORDER — SODIUM CHLORIDE 0.9 % IV SOLN
INTRAVENOUS | Status: DC
  Administered 2012-09-13: 12:00:00 via INTRAVENOUS
  Administered 2012-09-13: 75 mL/h via INTRAVENOUS

## 2012-09-13 MED ORDER — SENNOSIDES-DOCUSATE SODIUM 8.6-50 MG PO TABS: 1.0000 | ORAL_TABLET | Freq: Every evening | ORAL | Status: DC | PRN

## 2012-09-13 MED ORDER — CELECOXIB 200 MG PO CAPS
200.0000 mg | ORAL_CAPSULE | Freq: Two times a day (BID) | ORAL | Status: DC
  Administered 2012-09-13 – 2012-09-15 (×5): 200 mg via ORAL
  Filled 2012-09-13 (×5): qty 1

## 2012-09-13 MED ORDER — CEFAZOLIN SODIUM 1-5 GM-% IV SOLN
1.0000 g | Freq: Four times a day (QID) | INTRAVENOUS | Status: AC
  Administered 2012-09-13 (×2): 1 g via INTRAVENOUS
  Filled 2012-09-13 (×2): qty 50

## 2012-09-13 MED ORDER — BUPIVACAINE 0.25 % ON-Q PUMP SINGLE CATH 300ML
INJECTION | Status: DC | PRN
  Administered 2012-09-13: 300 mL

## 2012-09-13 MED ORDER — METOCLOPRAMIDE HCL 5 MG/ML IJ SOLN
INTRAMUSCULAR | Status: DC | PRN
  Administered 2012-09-13: 5 mg via INTRAVENOUS

## 2012-09-13 MED ORDER — ARTIFICIAL TEARS OP OINT
TOPICAL_OINTMENT | OPHTHALMIC | Status: DC | PRN
  Administered 2012-09-13: 1 via OPHTHALMIC

## 2012-09-13 MED ORDER — DIPHENHYDRAMINE HCL 12.5 MG/5ML PO ELIX: 12.5000 mg | ORAL_SOLUTION | ORAL | Status: DC | PRN

## 2012-09-13 MED ORDER — ALBUTEROL SULFATE HFA 108 (90 BASE) MCG/ACT IN AERS
2.0000 | INHALATION_SPRAY | Freq: Four times a day (QID) | RESPIRATORY_TRACT | Status: DC | PRN
  Filled 2012-09-13: qty 6.7

## 2012-09-13 MED ORDER — MOMETASONE FURO-FORMOTEROL FUM 100-5 MCG/ACT IN AERO
2.0000 | INHALATION_SPRAY | Freq: Two times a day (BID) | RESPIRATORY_TRACT | Status: DC
  Administered 2012-09-13 – 2012-09-15 (×4): 2 via RESPIRATORY_TRACT
  Filled 2012-09-13: qty 8.8

## 2012-09-13 MED ORDER — METFORMIN HCL 500 MG PO TABS
1000.0000 mg | ORAL_TABLET | Freq: Two times a day (BID) | ORAL | Status: DC
  Administered 2012-09-13 – 2012-09-15 (×5): 1000 mg via ORAL
  Filled 2012-09-13 (×6): qty 2

## 2012-09-13 MED ORDER — OXYCODONE HCL ER 10 MG PO T12A
10.0000 mg | EXTENDED_RELEASE_TABLET | Freq: Two times a day (BID) | ORAL | Status: DC
  Administered 2012-09-13 – 2012-09-15 (×5): 10 mg via ORAL
  Filled 2012-09-13 (×5): qty 1

## 2012-09-13 MED ORDER — INSULIN ASPART 100 UNIT/ML ~~LOC~~ SOLN
0.0000 [IU] | Freq: Three times a day (TID) | SUBCUTANEOUS | Status: DC
  Administered 2012-09-13 (×2): 5 [IU] via SUBCUTANEOUS
  Administered 2012-09-14: 3 [IU] via SUBCUTANEOUS
  Administered 2012-09-14 – 2012-09-15 (×3): 5 [IU] via SUBCUTANEOUS
  Administered 2012-09-15: 8 [IU] via SUBCUTANEOUS

## 2012-09-13 MED ORDER — METHOCARBAMOL 500 MG PO TABS
500.0000 mg | ORAL_TABLET | Freq: Four times a day (QID) | ORAL | Status: DC | PRN
  Administered 2012-09-13 – 2012-09-14 (×3): 500 mg via ORAL
  Filled 2012-09-13 (×2): qty 1

## 2012-09-13 MED ORDER — PROPOFOL 10 MG/ML IV BOLUS
INTRAVENOUS | Status: DC | PRN
  Administered 2012-09-13: 130 mg via INTRAVENOUS

## 2012-09-13 MED ORDER — BUPIVACAINE-EPINEPHRINE 0.25% -1:200000 IJ SOLN
INTRAMUSCULAR | Status: DC | PRN
  Administered 2012-09-13: 20 mL

## 2012-09-13 MED ORDER — BISACODYL 5 MG PO TBEC: 5.0000 mg | DELAYED_RELEASE_TABLET | Freq: Every day | ORAL | Status: DC | PRN

## 2012-09-13 MED ORDER — SODIUM CHLORIDE 0.9 % IV SOLN: INTRAVENOUS | Status: DC

## 2012-09-13 MED ORDER — ONDANSETRON HCL 4 MG/2ML IJ SOLN
INTRAMUSCULAR | Status: DC | PRN
  Administered 2012-09-13: 4 mg via INTRAVENOUS

## 2012-09-13 MED ORDER — METHOCARBAMOL 100 MG/ML IJ SOLN
500.0000 mg | Freq: Four times a day (QID) | INTRAVENOUS | Status: DC | PRN
  Filled 2012-09-13 (×2): qty 5

## 2012-09-13 MED ORDER — METHOCARBAMOL 500 MG PO TABS
ORAL_TABLET | ORAL | Status: AC
  Filled 2012-09-13: qty 1

## 2012-09-13 MED ORDER — CELECOXIB 200 MG PO CAPS
ORAL_CAPSULE | ORAL | Status: AC
  Filled 2012-09-13: qty 1

## 2012-09-13 MED ORDER — BUPIVACAINE 0.25 % ON-Q PUMP SINGLE CATH 300ML
300.0000 mL | INJECTION | Status: DC
  Filled 2012-09-13: qty 300

## 2012-09-13 MED ORDER — HYDROMORPHONE HCL PF 1 MG/ML IJ SOLN
INTRAMUSCULAR | Status: DC | PRN
  Administered 2012-09-13: .5 mg via INTRAVENOUS

## 2012-09-13 MED ORDER — ACETAMINOPHEN 10 MG/ML IV SOLN
1000.0000 mg | Freq: Four times a day (QID) | INTRAVENOUS | Status: DC
  Administered 2012-09-13: 1000 mg via INTRAVENOUS

## 2012-09-13 MED ORDER — VECURONIUM BROMIDE 10 MG IV SOLR
INTRAVENOUS | Status: DC | PRN
  Administered 2012-09-13: 6 mg via INTRAVENOUS

## 2012-09-13 MED ORDER — METOCLOPRAMIDE HCL 5 MG PO TABS
5.0000 mg | ORAL_TABLET | Freq: Three times a day (TID) | ORAL | Status: DC | PRN
  Filled 2012-09-13: qty 2

## 2012-09-13 MED ORDER — OXYCODONE HCL 5 MG PO TABS
ORAL_TABLET | ORAL | Status: AC
  Filled 2012-09-13: qty 2

## 2012-09-13 MED ORDER — ONDANSETRON HCL 4 MG/2ML IJ SOLN: 4.0000 mg | Freq: Once | INTRAMUSCULAR | Status: DC | PRN

## 2012-09-13 MED ORDER — ZOLPIDEM TARTRATE 5 MG PO TABS
5.0000 mg | ORAL_TABLET | Freq: Every evening | ORAL | Status: DC | PRN
  Administered 2012-09-14: 5 mg via ORAL
  Filled 2012-09-13: qty 1

## 2012-09-13 MED ORDER — BUPIVACAINE-EPINEPHRINE PF 0.25-1:200000 % IJ SOLN
INTRAMUSCULAR | Status: AC
  Filled 2012-09-13: qty 30

## 2012-09-13 MED ORDER — HYDROMORPHONE HCL PF 1 MG/ML IJ SOLN
INTRAMUSCULAR | Status: AC
  Filled 2012-09-13: qty 1

## 2012-09-13 MED ORDER — MENTHOL 3 MG MT LOZG: 1.0000 | LOZENGE | OROMUCOSAL | Status: DC | PRN

## 2012-09-13 MED ORDER — LIDOCAINE HCL (CARDIAC) 20 MG/ML IV SOLN
INTRAVENOUS | Status: DC | PRN
  Administered 2012-09-13: 30 mg via INTRAVENOUS

## 2012-09-13 MED ORDER — EPHEDRINE SULFATE 50 MG/ML IJ SOLN
INTRAMUSCULAR | Status: DC | PRN
  Administered 2012-09-13: 10 mg via INTRAVENOUS

## 2012-09-13 MED ORDER — DOCUSATE SODIUM 100 MG PO CAPS
100.0000 mg | ORAL_CAPSULE | Freq: Two times a day (BID) | ORAL | Status: DC
  Administered 2012-09-13 – 2012-09-15 (×5): 100 mg via ORAL
  Filled 2012-09-13 (×5): qty 1

## 2012-09-13 MED ORDER — METOCLOPRAMIDE HCL 5 MG/ML IJ SOLN: 5.0000 mg | Freq: Three times a day (TID) | INTRAMUSCULAR | Status: DC | PRN

## 2012-09-13 MED ORDER — ACETAMINOPHEN 10 MG/ML IV SOLN
1000.0000 mg | Freq: Four times a day (QID) | INTRAVENOUS | Status: AC
  Administered 2012-09-13 – 2012-09-14 (×4): 1000 mg via INTRAVENOUS
  Filled 2012-09-13 (×4): qty 100

## 2012-09-13 MED ORDER — ACETAMINOPHEN 650 MG RE SUPP: 650.0000 mg | Freq: Four times a day (QID) | RECTAL | Status: DC | PRN

## 2012-09-13 MED ORDER — HYDROMORPHONE HCL PF 1 MG/ML IJ SOLN: 1.0000 mg | INTRAMUSCULAR | Status: DC | PRN

## 2012-09-13 MED ORDER — LIRAGLUTIDE 18 MG/3ML ~~LOC~~ SOLN
1.8000 mg | Freq: Every day | SUBCUTANEOUS | Status: DC
  Filled 2012-09-13: qty 0.3

## 2012-09-13 MED ORDER — ENOXAPARIN SODIUM 30 MG/0.3ML ~~LOC~~ SOLN
30.0000 mg | Freq: Two times a day (BID) | SUBCUTANEOUS | Status: DC
  Administered 2012-09-14 – 2012-09-15 (×3): 30 mg via SUBCUTANEOUS
  Filled 2012-09-13: qty 0.3

## 2012-09-13 MED ORDER — BUPIVACAINE ON-Q PAIN PUMP (FOR ORDER SET NO CHG)
INJECTION | Status: DC
  Filled 2012-09-13: qty 1

## 2012-09-13 MED ORDER — ACETAMINOPHEN 325 MG PO TABS: 650.0000 mg | ORAL_TABLET | Freq: Four times a day (QID) | ORAL | Status: DC | PRN

## 2012-09-13 MED ORDER — FENTANYL CITRATE 0.05 MG/ML IJ SOLN
INTRAMUSCULAR | Status: DC | PRN
  Administered 2012-09-13 (×2): 100 ug via INTRAVENOUS
  Administered 2012-09-13: 50 ug via INTRAVENOUS

## 2012-09-13 MED ORDER — ALUM & MAG HYDROXIDE-SIMETH 200-200-20 MG/5ML PO SUSP: 30.0000 mL | ORAL | Status: DC | PRN

## 2012-09-13 MED ORDER — FLEET ENEMA 7-19 GM/118ML RE ENEM: 1.0000 | ENEMA | Freq: Once | RECTAL | Status: AC | PRN

## 2012-09-13 MED ORDER — HYDROMORPHONE HCL PF 1 MG/ML IJ SOLN
0.2500 mg | INTRAMUSCULAR | Status: DC | PRN
  Administered 2012-09-13 (×2): 0.5 mg via INTRAVENOUS

## 2012-09-13 MED ORDER — OXYCODONE HCL 5 MG PO TABS
5.0000 mg | ORAL_TABLET | ORAL | Status: DC | PRN
  Administered 2012-09-13 – 2012-09-14 (×2): 10 mg via ORAL
  Administered 2012-09-14: 5 mg via ORAL
  Administered 2012-09-14: 10 mg via ORAL
  Administered 2012-09-14: 5 mg via ORAL
  Administered 2012-09-15: 10 mg via ORAL
  Filled 2012-09-13: qty 1
  Filled 2012-09-13 (×2): qty 2
  Filled 2012-09-13: qty 1
  Filled 2012-09-13: qty 2

## 2012-09-13 MED ORDER — PHENOL 1.4 % MT LIQD: 1.0000 | OROMUCOSAL | Status: DC | PRN

## 2012-09-13 MED ORDER — GLYCOPYRROLATE 0.2 MG/ML IJ SOLN
INTRAMUSCULAR | Status: DC | PRN
  Administered 2012-09-13: .8 mg via INTRAVENOUS

## 2012-09-13 SURGICAL SUPPLY — 60 items
BANDAGE ESMARK 6X9 LF (GAUZE/BANDAGES/DRESSINGS) ×1 IMPLANT
BLADE SAGITTAL 13X1.27X60 (BLADE) ×2 IMPLANT
BLADE SAW SGTL 83.5X18.5 (BLADE) ×2 IMPLANT
BNDG CMPR 9X6 STRL LF SNTH (GAUZE/BANDAGES/DRESSINGS) ×1
BNDG ESMARK 6X9 LF (GAUZE/BANDAGES/DRESSINGS) ×2
BOWL SMART MIX CTS (DISPOSABLE) ×2 IMPLANT
CATH KIT ON Q 5IN SLV (PAIN MANAGEMENT) ×2 IMPLANT
CEMENT BONE SIMPLEX SPEEDSET (Cement) ×4 IMPLANT
CLOTH BEACON ORANGE TIMEOUT ST (SAFETY) ×2 IMPLANT
COVER BACK TABLE 24X17X13 BIG (DRAPES) IMPLANT
COVER SURGICAL LIGHT HANDLE (MISCELLANEOUS) ×2 IMPLANT
CUFF TOURNIQUET SINGLE 34IN LL (TOURNIQUET CUFF) ×2 IMPLANT
DRAPE EXTREMITY T 121X128X90 (DRAPE) ×2 IMPLANT
DRAPE INCISE IOBAN 66X45 STRL (DRAPES) ×4 IMPLANT
DRAPE PROXIMA HALF (DRAPES) ×2 IMPLANT
DRAPE U-SHAPE 47X51 STRL (DRAPES) ×2 IMPLANT
DRSG ADAPTIC 3X8 NADH LF (GAUZE/BANDAGES/DRESSINGS) ×2 IMPLANT
DRSG PAD ABDOMINAL 8X10 ST (GAUZE/BANDAGES/DRESSINGS) ×2 IMPLANT
DURAPREP 26ML APPLICATOR (WOUND CARE) ×3 IMPLANT
ELECT REM PT RETURN 9FT ADLT (ELECTROSURGICAL) ×2
ELECTRODE REM PT RTRN 9FT ADLT (ELECTROSURGICAL) ×1 IMPLANT
EVACUATOR 1/8 PVC DRAIN (DRAIN) ×2 IMPLANT
GLOVE BIOGEL M 7.0 STRL (GLOVE) IMPLANT
GLOVE BIOGEL PI IND STRL 7.5 (GLOVE) IMPLANT
GLOVE BIOGEL PI IND STRL 8.5 (GLOVE) ×1 IMPLANT
GLOVE BIOGEL PI INDICATOR 7.5 (GLOVE)
GLOVE BIOGEL PI INDICATOR 8.5 (GLOVE) ×1
GLOVE BIOGEL PI ORTHO PRO SZ8 (GLOVE) ×1
GLOVE PI ORTHO PRO STRL SZ8 (GLOVE) ×1 IMPLANT
GLOVE SURG ORTHO 8.0 STRL STRW (GLOVE) ×5 IMPLANT
GOWN PREVENTION PLUS XLARGE (GOWN DISPOSABLE) ×3 IMPLANT
GOWN STRL NON-REIN LRG LVL3 (GOWN DISPOSABLE) ×4 IMPLANT
HANDPIECE INTERPULSE COAX TIP (DISPOSABLE) ×2
HOOD PEEL AWAY FACE SHEILD DIS (HOOD) ×7 IMPLANT
KIT BASIN OR (CUSTOM PROCEDURE TRAY) ×2 IMPLANT
KIT ROOM TURNOVER OR (KITS) ×2 IMPLANT
MANIFOLD NEPTUNE II (INSTRUMENTS) ×2 IMPLANT
NEEDLE 22X1 1/2 (OR ONLY) (NEEDLE) ×1 IMPLANT
NS IRRIG 1000ML POUR BTL (IV SOLUTION) ×2 IMPLANT
PACK TOTAL JOINT (CUSTOM PROCEDURE TRAY) ×2 IMPLANT
PAD ARMBOARD 7.5X6 YLW CONV (MISCELLANEOUS) ×4 IMPLANT
PADDING CAST ABS 6INX4YD NS (CAST SUPPLIES) ×1
PADDING CAST ABS COTTON 6X4 NS (CAST SUPPLIES) IMPLANT
PADDING CAST COTTON 6X4 STRL (CAST SUPPLIES) ×2 IMPLANT
POSITIONER HEAD PRONE TRACH (MISCELLANEOUS) ×2 IMPLANT
SET HNDPC FAN SPRY TIP SCT (DISPOSABLE) ×1 IMPLANT
SPONGE GAUZE 4X4 12PLY (GAUZE/BANDAGES/DRESSINGS) ×2 IMPLANT
STAPLER VISISTAT 35W (STAPLE) ×2 IMPLANT
SUCTION FRAZIER TIP 10 FR DISP (SUCTIONS) ×2 IMPLANT
SUT BONE WAX W31G (SUTURE) ×2 IMPLANT
SUT VIC AB 0 CTB1 27 (SUTURE) ×4 IMPLANT
SUT VIC AB 1 CT1 27 (SUTURE) ×6
SUT VIC AB 1 CT1 27XBRD ANBCTR (SUTURE) ×2 IMPLANT
SUT VIC AB 2-0 CT1 27 (SUTURE) ×4
SUT VIC AB 2-0 CT1 TAPERPNT 27 (SUTURE) ×2 IMPLANT
SYR CONTROL 10ML LL (SYRINGE) IMPLANT
TOWEL OR 17X24 6PK STRL BLUE (TOWEL DISPOSABLE) ×2 IMPLANT
TOWEL OR 17X26 10 PK STRL BLUE (TOWEL DISPOSABLE) ×2 IMPLANT
TRAY FOLEY CATH 14FR (SET/KITS/TRAYS/PACK) ×2 IMPLANT
WATER STERILE IRR 1000ML POUR (IV SOLUTION) ×5 IMPLANT

## 2012-09-13 NOTE — Anesthesia Postprocedure Evaluation (Signed)
  Anesthesia Post-op Note  Patient: Abigail Collins  Procedure(s) Performed: Procedure(s) (LRB) with comments: TOTAL KNEE ARTHROPLASTY (Right)  Patient Location: PACU  Anesthesia Type:General and GA combined with regional for post-op pain  Level of Consciousness: awake, alert  and oriented  Airway and Oxygen Therapy: Patient Spontanous Breathing and Patient connected to nasal cannula oxygen  Post-op Pain: mild  Post-op Assessment: Post-op Vital signs reviewed and Patient's Cardiovascular Status Stable  Post-op Vital Signs: stable  Complications: No apparent anesthesia complications

## 2012-09-13 NOTE — Progress Notes (Signed)
Orthopedic Tech Progress Note Patient Details:  RICKELL CHAMPLIN 12-13-1950 [id number]  Ortho Devices Type of Ortho Device: Knee Immobilizer Ortho Device/Splint Interventions: Application   Markeise Mathews 09/13/2012, 2:50 PM

## 2012-09-13 NOTE — Preoperative (Signed)
Beta Blockers   Reason not to administer Beta Blockers:Not Applicable 

## 2012-09-13 NOTE — H&P (Signed)
Abigail Collins MRN:  [id number] DOB/SEX:  1951/03/29/female  CHIEF COMPLAINT:  Painful right Knee  HISTORY: Patient is a 61 y.o. female presented with a history of pain in the right knee. Onset of symptoms was gradual starting several years ago with gradually worsening course since that time. The patient noted no past surgery on the right knee. Prior procedures on the knee include meniscectomy. Patient has been treated conservatively with over-the-counter NSAIDs and activity modification. Patient currently rates pain in the knee at 10 out of 10 with activity. There is pain at night.  PAST MEDICAL HISTORY: Patient Active Problem List   Diagnosis Date Noted  . Pure hyperglyceridemia 08/05/2012  . Paresthesias 08/05/2012  . Gastroparesis due to DM 08/05/2012  . Other screening mammogram 08/05/2012  . Other nonspecific abnormal cardiovascular system function study 05/13/2012  . Diabetes mellitus with neurological manifestations, uncontrolled 04/05/2012  . HTN (hypertension), benign 04/05/2012  . GERD (gastroesophageal reflux disease) 04/05/2012  . Morbid obesity 04/05/2012  . Osteoarthritis of right knee 06/19/2011   Past Medical History  Diagnosis Date  . Arthritis   . Depression   . Diabetes mellitus   . Headache   . Thyroid disease   . Knee pain     right - to have knee replacement  . Carpal tunnel syndrome of left wrist   . Bulging disc     "lower"  . PONV (postoperative nausea and vomiting)   . Shortness of breath     hx of  . Anginal pain     "left arm pain, sees Dr. Dione Collins, had card cath 2013"  . Hyperlipidemia   . Hypertension     sees Dr. Nolon Bussing, primary  . Asthma     takes inhaler 2x day  . Bronchitis     hx of  . Urinary tract infection     hx of  . Anemia     hx of   Past Surgical History  Procedure Date  . Abdominal hysterectomy   . Cholecystectomy   . Elbow surgery     right  . Cardiac catheterization     05/13/12 - mod luminal irregularity  of pLAD, no sign CAD, EF 65%.     MEDICATIONS:   Prescriptions prior to admission  Medication Sig Dispense Refill  . albuterol (PROVENTIL HFA;VENTOLIN HFA) 108 (90 BASE) MCG/ACT inhaler Inhale 2 puffs into the lungs every 6 (six) hours as needed. For shortness of breath.      . Liraglutide (VICTOZA) 18 MG/3ML SOLN Inject 0.3 mLs (1.8 mg total) into the skin daily.  6 mL  11  . metFORMIN (GLUCOPHAGE) 1000 MG tablet Take 1 tablet (1,000 mg total) by mouth 2 (two) times daily with a meal.  180 tablet  1  . mometasone-formoterol (DULERA) 100-5 MCG/ACT AERO Inhale 2 puffs into the lungs 2 (two) times daily.  3 Inhaler  0  . ibuprofen (ADVIL,MOTRIN) 200 MG tablet Take 200 mg by mouth every 6 (six) hours as needed. For pain.        ALLERGIES:   Allergies  Allergen Reactions  . Lisinopril Shortness Of Breath    REVIEW OF SYSTEMS:  Pertinent items are noted in HPI.   FAMILY HISTORY:   Family History  Problem Relation Age of Onset  . Arthritis Mother   . Arthritis Father   . Hypertension Father   . Diabetes Father     SOCIAL HISTORY:   History  Substance Use Topics  . Smoking status: Former  Smoker -- 0.2 packs/day for 20 years    Quit date: 10/13/2009  . Smokeless tobacco: Never Used  . Alcohol Use: No     EXAMINATION:  Vital signs in last 24 hours: Temp:  [97.9 F (36.6 C)] 97.9 F (36.6 C) (12/02 0619) Pulse Rate:  [91-105] 93  (12/02 0716) Resp:  [18] 18  (12/02 0619) BP: (145)/(95) 145/95 mmHg (12/02 0619) SpO2:  [94 %] 94 % (12/02 0619)  General appearance: alert, cooperative and no distress Lungs: clear to auscultation bilaterally Heart: regular rate and rhythm, S1, S2 normal, no murmur, click, rub or gallop Abdomen: soft, non-tender; bowel sounds normal; no masses,  no organomegaly Extremities: extremities normal, atraumatic, no cyanosis or edema and Homans sign is negative, no sign of DVT Pulses: 2+ and symmetric Skin: Skin color, texture, turgor normal. No  rashes or lesions Neurologic: Alert and oriented X 3, normal strength and tone. Normal symmetric reflexes. Normal coordination and gait  Musculoskeletal:  ROM 0-105, Ligaments intact,  Imaging Review Plain radiographs demonstrate severe degenerative joint disease of the right knee. The overall alignment is mild valgus. The bone quality appears to be good for age and reported activity level.  Assessment/Plan: End stage arthritis, right knee   The patient history, physical examination and imaging studies are consistent with advanced degenerative joint disease of the right knee. The patient has failed conservative treatment.  The clearance notes were reviewed.  After discussion with the patient it was felt that Total Knee Replacement was indicated. The procedure,  risks, and benefits of total knee arthroplasty were presented and reviewed. The risks including but not limited to aseptic loosening, infection, blood clots, vascular injury, stiffness, patella tracking problems complications among others were discussed. The patient acknowledged the explanation, agreed to proceed with the plan.  Cache Bills 09/13/2012, 7:16 AM

## 2012-09-13 NOTE — Progress Notes (Signed)
Orthopedic Tech Progress Note Patient Details:  Abigail Collins 1950/11/05 [id number]  CPM Right Knee CPM Right Knee: On Right Knee Flexion (Degrees): 90  Right Knee Extension (Degrees): 0  Additional Comments: trapeze bar   Cammer, Mickie Bail 09/13/2012, 11:59 AM

## 2012-09-13 NOTE — Evaluation (Signed)
Physical Therapy Evaluation Patient Details Name: Abigail Collins MRN: [id number] DOB: 1950/11/04 Today's Date: 09/13/2012 Time: 4098-1191 PT Time Calculation (min): 28 min  PT Assessment / Plan / Recommendation Clinical Impression  Patient is a 61 yo female s/p Rt. TKA.  Able to sit on EOB today - limited by nausea.  Patient will benefit from acute PT to maximize independence prior to return home with husband.    PT Assessment  Patient needs continued PT services    Follow Up Recommendations  Home health PT;Supervision/Assistance - 24 hour    Does the patient have the potential to tolerate intense rehabilitation      Barriers to Discharge None      Equipment Recommendations  None recommended by PT    Recommendations for Other Services     Frequency 7X/week    Precautions / Restrictions Precautions Precautions: Knee Precaution Booklet Issued: Yes (comment) Precaution Comments: Reviewed precautions with patient. Required Braces or Orthoses: Knee Immobilizer - Right Knee Immobilizer - Right: On when out of bed or walking Restrictions Weight Bearing Restrictions: Yes RLE Weight Bearing: Weight bearing as tolerated   Pertinent Vitals/Pain       Mobility  Bed Mobility Bed Mobility: Supine to Sit;Sitting - Scoot to Edge of Bed;Sit to Supine Supine to Sit: 3: Mod assist;HOB flat Sitting - Scoot to Edge of Bed: 4: Min assist;With rail Sit to Supine: 1: +2 Total assist;HOB flat Sit to Supine: Patient Percentage: 20% Details for Bed Mobility Assistance: Verbal cues for moving to sitting.  Assist to move RLE off bed.  Patient sat EOB x 6 minutes.  Became lightheaded and nauseated.  Returned to supine with +2 assist. Transfers Transfers: Not assessed       Exercises Total Joint Exercises Ankle Circles/Pumps: AROM;Both;10 reps;Supine   PT Diagnosis: Difficulty walking;Generalized weakness;Acute pain  PT Problem List: Decreased strength;Decreased range of  motion;Decreased activity tolerance;Decreased mobility;Decreased knowledge of use of DME;Decreased knowledge of precautions;Pain PT Treatment Interventions: DME instruction;Gait training;Stair training;Functional mobility training;Therapeutic exercise;Patient/family education   PT Goals Acute Rehab PT Goals PT Goal Formulation: With patient/family Time For Goal Achievement: 09/20/12 Potential to Achieve Goals: Good Pt will go Supine/Side to Sit: Independently;with HOB 0 degrees PT Goal: Supine/Side to Sit - Progress: Goal set today Pt will go Sit to Supine/Side: Independently;with HOB 0 degrees PT Goal: Sit to Supine/Side - Progress: Goal set today Pt will go Sit to Stand: with supervision;with upper extremity assist PT Goal: Sit to Stand - Progress: Goal set today Pt will Ambulate: 51 - 150 feet;with supervision;with rolling walker PT Goal: Ambulate - Progress: Goal set today Pt will Go Up / Down Stairs: 3-5 stairs;with min assist;with rail(s);with least restrictive assistive device PT Goal: Up/Down Stairs - Progress: Goal set today Pt will Perform Home Exercise Program: Independently PT Goal: Perform Home Exercise Program - Progress: Goal set today  Visit Information  Last PT Received On: 09/13/12 Assistance Needed: +2    Subjective Data  Subjective: "I feel lightheaded and nauseated" Patient Stated Goal: To walk   Prior Functioning  Home Living Lives With: Spouse Available Help at Discharge: Family;Available 24 hours/day (for 1 week) Type of Home: House Home Access: Stairs to enter [employer] of Steps: 3 Entrance Stairs-Rails: Left Home Layout: One level Bathroom Shower/Tub: Forensic scientist: Standard Bathroom Accessibility: Yes How Accessible: Accessible via walker Home Adaptive Equipment: Bedside commode/3-in-1;Walker - rolling;Straight cane Prior Function Level of Independence: Independent;Needs assistance Needs Assistance: Light  Housekeeping Light Housekeeping: Maximal Able  to Take Stairs?: Yes Driving: No Communication Communication: No difficulties    Cognition  Overall Cognitive Status: Appears within functional limits for tasks assessed/performed Arousal/Alertness: Lethargic Orientation Level: Appears intact for tasks assessed Behavior During Session: Lethargic Cognition - Other Comments: Pain meds making patient drowsy/lethargic    Extremity/Trunk Assessment Right Upper Extremity Assessment RUE ROM/Strength/Tone: [facility] for tasks assessed Left Upper Extremity Assessment LUE ROM/Strength/Tone: WFL for tasks assessed Right Lower Extremity Assessment RLE ROM/Strength/Tone: Deficits;Unable to fully assess;Due to pain RLE ROM/Strength/Tone Deficits: Able to assist moving RLE off bed. Left Lower Extremity Assessment LLE ROM/Strength/Tone: [facility] for tasks assessed   Balance Balance Balance Assessed: Yes Static Sitting Balance Static Sitting - Balance Support: Feet supported;Right upper extremity supported Static Sitting - Level of Assistance: 5: Stand by assistance Static Sitting - Comment/# of Minutes: Patient able to maintain good sitting balance x6 minutes.  Patient became lightheaded and nauseated.  Returned to supine with +2 assist.  End of Session PT - End of Session Equipment Utilized During Treatment: Oxygen Activity Tolerance: Patient limited by fatigue;Treatment limited secondary to medical complications (Comment) (Nausea and lightheaded feelings) Patient left: in bed;in CPM;with call bell/phone within reach;with family/visitor present Nurse Communication: Mobility status;Other (comment) (Nausea) CPM Right Knee CPM Right Knee: On Right Knee Flexion (Degrees): 90  Right Knee Extension (Degrees): 0   GP     Vena Austria 09/13/2012, 4:44 PM Durenda Hurt. Renaldo Fiddler, [facility] Acute Rehab Services Pager [phone]

## 2012-09-13 NOTE — Anesthesia Postprocedure Evaluation (Signed)
  Anesthesia Post-op Note  Patient: Abigail Collins  Procedure(s) Performed: Procedure(s) (LRB) with comments: TOTAL KNEE ARTHROPLASTY (Right)  Patient Location: PACU  Anesthesia Type:General and GA combined with regional for post-op pain  Level of Consciousness: awake, alert  and oriented  Airway and Oxygen Therapy: Patient Spontanous Breathing and Patient connected to nasal cannula oxygen  Post-op Pain: mild  Post-op Assessment: Post-op Vital signs reviewed, Patient's Cardiovascular Status Stable, RESPIRATORY FUNCTION UNSTABLE and Pain level controlled  Post-op Vital Signs: stable  Complications: No apparent anesthesia complications

## 2012-09-13 NOTE — Anesthesia Preprocedure Evaluation (Addendum)
Anesthesia Evaluation  Patient identified by MRN, date of birth, ID band Patient awake    Reviewed: Allergy & Precautions, H&P , NPO status , Patient's Chart, lab work & pertinent test results  History of Anesthesia Complications (+) PONV  Airway Mallampati: II TM Distance: >3 FB Neck ROM: full    Dental  (+) Teeth Intact, Dental Advidsory Given and Edentulous Upper   Pulmonary shortness of breath and with exertion, asthma ,  breath sounds clear to auscultation        Cardiovascular hypertension, On Medications Rhythm:Regular Rate:Normal     Neuro/Psych  Headaches,  Neuromuscular disease    GI/Hepatic GERD-  Medicated,  Endo/Other  diabetes, Type 2  Renal/GU      Musculoskeletal   Abdominal   Peds  Hematology   Anesthesia Other Findings   Reproductive/Obstetrics                         Anesthesia Physical Anesthesia Plan  ASA: III  Anesthesia Plan:    Post-op Pain Management:    Induction: Intravenous  Airway Management Planned: Oral ETT  Additional Equipment:   Intra-op Plan:   Post-operative Plan:   Informed Consent: I have reviewed the patients History and Physical, chart, labs and discussed the procedure including the risks, benefits and alternatives for the proposed anesthesia with the patient or authorized representative who has indicated his/her understanding and acceptance.   Dental Advisory Given  Plan Discussed with: CRNA and Surgeon  Anesthesia Plan Comments:        Anesthesia Quick Evaluation

## 2012-09-13 NOTE — Plan of Care (Signed)
Problem: Consults Goal: Diagnosis- Total Joint Replacement Primary Total Knee Right     

## 2012-09-13 NOTE — Transfer of Care (Signed)
Immediate Anesthesia Transfer of Care Note  Patient: Abigail Collins  Procedure(s) Performed: Procedure(s) (LRB) with comments: TOTAL KNEE ARTHROPLASTY (Right)  Patient Location: PACU  Anesthesia Type:General  Level of Consciousness: awake, alert  and oriented  Airway & Oxygen Therapy: Patient Spontanous Breathing and Patient connected to nasal cannula oxygen  Post-op Assessment: Report given to PACU RN and Post -op Vital signs reviewed and stable  Post vital signs: Reviewed and stable  Complications: No apparent anesthesia complications

## 2012-09-13 NOTE — Anesthesia Procedure Notes (Addendum)
Procedure Name: Intubation Date/Time: 09/13/2012 7:41 AM Performed by: Carmela Rima Pre-anesthesia Checklist: Patient identified, Timeout performed, Emergency Drugs available, Suction available and Patient being monitored Patient Re-evaluated:Patient Re-evaluated prior to inductionOxygen Delivery Method: Circle system utilized Preoxygenation: Pre-oxygenation with 100% oxygen Intubation Type: IV induction Ventilation: Mask ventilation without difficulty Laryngoscope Size: Mac and 3 Grade View: Grade I Tube type: Oral Tube size: 7.5 mm Number of attempts: 1 Placement Confirmation: ETT inserted through vocal cords under direct vision,  positive ETCO2 and breath sounds checked- equal and bilateral Secured at: 21 cm Tube secured with: Tape Dental Injury: Teeth and Oropharynx as per pre-operative assessment    Anesthesia Regional Block:  Femoral nerve block  Pre-Anesthetic Checklist: ,, timeout performed, Correct Patient, Correct Site, Correct Laterality, Correct Procedure, Correct Position, site marked, Risks and benefits discussed,  Surgical consent,  Pre-op evaluation,  At surgeon's request and post-op pain management  Laterality: Right  Prep: chloraprep       Needles:  Injection technique: Single-shot  Needle Type: Echogenic Stimulator Needle     Needle Length:cm 9 cm Needle Gauge: 22 and 22 G    Additional Needles:  Procedures: ultrasound guided (picture in chart) and nerve stimulator Femoral nerve block Narrative:  Start time: 09/13/2012 7:15 AM End time: 09/13/2012 7:25 AM Injection made incrementally with aspirations every 30 mL.  Performed by: Personally   Additional Notes: 30 cc 0.5% Marcaine with 1:200 Epi injected easily  Abigail Brood, MD  Femoral nerve block

## 2012-09-14 ENCOUNTER — Encounter (HOSPITAL_COMMUNITY): Payer: Self-pay | Admitting: Orthopedic Surgery

## 2012-09-14 LAB — CBC
HCT: 37.7 % (ref 36.0–46.0)
Hemoglobin: 12.5 g/dL (ref 12.0–15.0)
MCH: 31.6 pg (ref 26.0–34.0)
MCHC: 33.2 g/dL (ref 30.0–36.0)
MCV: 95.4 fL (ref 78.0–100.0)
Platelets: 157 10*3/uL (ref 150–400)
RBC: 3.95 MIL/uL (ref 3.87–5.11)
RDW: 13 % (ref 11.5–15.5)
WBC: 9.4 10*3/uL (ref 4.0–10.5)

## 2012-09-14 LAB — GLUCOSE, CAPILLARY
Glucose-Capillary: 228 mg/dL — ABNORMAL HIGH (ref 70–99)
Glucose-Capillary: 247 mg/dL — ABNORMAL HIGH (ref 70–99)
Glucose-Capillary: 189 mg/dL — ABNORMAL HIGH (ref 70–99)

## 2012-09-14 LAB — BASIC METABOLIC PANEL
BUN: 7 mg/dL (ref 6–23)
CO2: 26 mEq/L (ref 19–32)
Calcium: 8.7 mg/dL (ref 8.4–10.5)
Chloride: 104 mEq/L (ref 96–112)
Creatinine, Ser: 0.63 mg/dL (ref 0.50–1.10)
GFR calc Af Amer: >90 mL/min (ref >90)
GFR calc non Af Amer: >90 mL/min (ref >90)
Glucose, Bld: 249 mg/dL — ABNORMAL HIGH (ref 70–99)
Potassium: 4 mEq/L (ref 3.5–5.1)
Sodium: 138 mEq/L (ref 135–145)

## 2012-09-14 NOTE — Progress Notes (Signed)
Inpatient Diabetes Program Recommendations  AACE/ADA: New Consensus Statement on Inpatient Glycemic Control (2013)  Target Ranges:  Prepandial:   less than 140 mg/dL      Peak postprandial:   less than 180 mg/dL (1-2 hours)      Critically ill patients:  140 - 180 mg/dL   Reason for Visit: Fasting hyperglycemia  Inpatient Diabetes Program Recommendations Insulin - Basal: Fasting glucose levels are high.  Please add lantus 10-15 units at HS (or daily) while here. The Victoza will help with post-prandial levels.  Note: Thank you, Lenor Coffin, RN, CNS, Diabetes Coordinator [phone])

## 2012-09-14 NOTE — Progress Notes (Signed)
UR COMPLETED  

## 2012-09-14 NOTE — Progress Notes (Signed)
PT Treatment Note:   09/14/12 1211  PT Visit Information  Last PT Received On 09/14/12  Assistance Needed +1  PT Time Calculation  PT Start Time 1140  PT Stop Time 1203  PT Time Calculation (min) 23 min  Subjective Data  Subjective "I am just so frustrated."  Patient Stated Goal To walk  Precautions  Precautions Knee  Precaution Booklet Issued No  Required Braces or Orthoses Knee Immobilizer - Right  Knee Immobilizer - Right On when out of bed or walking  Restrictions  Weight Bearing Restrictions Yes  RLE Weight Bearing WBAT  Cognition  Overall Cognitive Status Appears within functional limits for tasks assessed/performed  Arousal/Alertness Awake/alert  Orientation Level Appears intact for tasks assessed  Behavior During Session [facility] for tasks performed  Bed Mobility  Bed Mobility Not assessed  Transfers  Transfers Sit to Stand;Stand to Sit (2 trials.)  Sit to Stand 4: Min guard;With upper extremity assist;From chair/3-in-1  Stand to Sit 4: Min guard;With upper extremity assist;To chair/3-in-1  Details for Transfer Assistance Guarding for balance with cues for safest hand/right LE placement.  Ambulation/Gait  Ambulation/Gait Assistance 4: Min guard  Ambulation Distance (Feet) 100 Feet  Assistive device Rolling walker  Ambulation/Gait Assistance Details Guarding for balance with cues for safest sequence and tall posture.  Gait Pattern Step-to pattern;Decreased step length - right;Decreased stance time - right;Trunk flexed;Narrow base of support  Stairs No  Wheelchair Mobility  Wheelchair Mobility No  Balance  Balance Assessed No  PT - End of Session  Equipment Utilized During Treatment Gait belt;Right knee immobilizer  Activity Tolerance Patient tolerated treatment well  Patient left in chair;with call bell/phone within reach  Nurse Communication Mobility status  PT - Assessment/Plan  Comments on Treatment Session Pt admitted s/p right TKA and is progressing with  therapy. Able to increase ambulation distance significantly this pm as well as independence level.  PT Plan Discharge plan remains appropriate;Frequency remains appropriate  PT Frequency 7X/week  Follow Up Recommendations Home health PT;Supervision/Assistance - 24 hour  Equipment Recommended None recommended by PT  Acute Rehab PT Goals  PT Goal Formulation With patient/family  Time For Goal Achievement 09/20/12  Potential to Achieve Goals Good  PT Goal: Sit to Stand - Progress Progressing toward goal  PT Goal: Ambulate - Progress Progressing toward goal  PT General Charges  $$ ACUTE PT VISIT 1 Procedure  PT Treatments  $Gait Training 8-22 mins  $Therapeutic Activity 8-22 mins     Pain:  5/10 in right knee. Pt repositioned.  09/14/2012 Cephus Shelling, PT, DPT [phone]

## 2012-09-14 NOTE — Progress Notes (Signed)
SPORTS MEDICINE AND JOINT REPLACEMENT  Georgena Spurling, MD   Altamese Cabal, PA-C [address] [place], [place], [place]  [address]                             [phone]   PROGRESS NOTE  Subjective:  negative for Chest Pain  negative for Shortness of Breath  negative for Nausea/Vomiting   negative for Calf Pain  negative for Bowel Movement   Tolerating Diet: yes         Patient reports pain as 9 on 0-10 scale.    Objective: Vital signs in last 24 hours:   Patient Vitals for the past 24 hrs:  BP Temp Temp src Pulse Resp SpO2  09/14/12 0800 183/82 mmHg 98.1 F (36.7 C) Oral 96  16  96 %  09/14/12 0650 160/88 mmHg 98.5 F (36.9 C) - 91  18  94 %  09/14/12 0400 - - - - 18  96 %  09/13/12 2354 - - - - 18  96 %  09/13/12 2140 154/94 mmHg 98.3 F (36.8 C) - 96  18  96 %  09/13/12 2000 - - - - 18  97 %  09/13/12 1954 - - - 95  18  95 %  09/13/12 1500 152/86 mmHg 97.8 F (36.6 C) - 99  18  94 %  09/13/12 1030 161/89 mmHg 97.3 F (36.3 C) Oral 74  18  93 %  09/13/12 1003 - - - 103  16  95 %  09/13/12 1002 - - - 105  19  94 %  09/13/12 1001 - - - 101  14  93 %  09/13/12 1000 161/90 mmHg - - 99  13  94 %  09/13/12 0959 - - - 98  11  93 %  09/13/12 0958 - - - 103  13  93 %  09/13/12 0957 - - - 104  12  94 %  09/13/12 0956 - - - 104  15  95 %  09/13/12 0955 - - - 105  16  94 %  09/13/12 0954 - - - 101  15  93 %  09/13/12 0953 - - - 101  12  93 %  09/13/12 0952 - - - 97  11  92 %  09/13/12 0951 - - - 100  11  93 %  09/13/12 0950 - - - 98  11  93 %  09/13/12 0949 - - - 101  11  93 %  09/13/12 0948 - - - 101  14  94 %  09/13/12 0947 172/92 mmHg - - 102  12  93 %  09/13/12 0946 - - - 104  12  94 %  09/13/12 0945 - - - 99  16  88 %  09/13/12 0944 - - - 109  17  94 %  09/13/12 0943 - - - 107  13  93 %  09/13/12 0942 - - - 103  20  94 %  09/13/12 0941 - - - 105  16  94 %  09/13/12 0940 - - - 110  16  95 %  09/13/12 0939 - - - 108  12  94 %  09/13/12 0938 - - - 103  17  94 %   09/13/12 0937 - - - 103  12  94 %  09/13/12 0936 - - - 106  11  96 %  09/13/12 0935 - - -  105  13  95 %  09/13/12 0934 - - - 108  15  95 %  09/13/12 0933 - - - 112  25  94 %  09/13/12 0932 170/95 mmHg - - - - -  09/13/12 0930 - 97 F (36.1 C) - - - -    @flow {1959:LAST@   Intake/Output from previous day:   12/02 0701 - 12/03 0700 In: 1640 [P.O.:240; I.V.:1400] Out: 3545 [Urine:3120; Drains:325]   Intake/Output this shift:       Intake/Output      12/02 0701 - 12/03 0700 12/03 0701 - 12/04 0700   P.O. 240    I.V. 1400    Total Intake 1640    Urine 3120    Drains 325    Blood 100    Total Output 3545    Net -1905            LABORATORY DATA:  Basename 09/14/12 0640  WBC 9.4  HGB 12.5  HCT 37.7  PLT 157    Basename 09/14/12 0640  NA 138  K 4.0  CL 104  CO2 26  BUN 7  CREATININE 0.63  GLUCOSE 249*  CALCIUM 8.7   Lab Results  Component Value Date   INR 0.88 09/03/2012   INR 0.96 04/06/2012    Examination:  General appearance: alert, cooperative and no distress Extremities: Homans sign is negative, no sign of DVT  Wound Exam: clean, dry, intact   Drainage:  Scant/small amount Serosanguinous exudate  Motor Exam: EHL and FHL Intact  Sensory Exam: Deep Peroneal normal  Vascular Exam:    Assessment:    1 Day Post-Op  Procedure(s) (LRB): TOTAL KNEE ARTHROPLASTY (Right)  ADDITIONAL DIAGNOSIS:  Active Problems:  * No active hospital problems. *   Acute Blood Loss Anemia   Plan: Physical Therapy as ordered Weight Bearing as Tolerated (WBAT)  DVT Prophylaxis:  Lovenox  DISCHARGE PLAN: Home  DISCHARGE NEEDS: HHPT, CPM, Walker and 3-in-1 comode seat         Oralee Rapaport 09/14/2012, 8:42 AM

## 2012-09-14 NOTE — Evaluation (Signed)
Occupational Therapy Evaluation Patient Details Name: Abigail Collins MRN: [id number] DOB: [birth year] Today's Date: 09/14/2012 Time: 5284-1324 OT Time Calculation (min): 36 min  OT Assessment / Plan / Recommendation Clinical Impression  61 yo female s/p Rt TKA that coudl benefit from skilled OT acutely. Recommend HHOT at d/c    OT Assessment  Patient needs continued OT Services    Follow Up Recommendations  Home health OT    Barriers to Discharge      Equipment Recommendations  None recommended by OT    Recommendations for Other Services    Frequency  Min 2X/week    Precautions / Restrictions Precautions Precautions: Knee Precaution Booklet Issued: No Required Braces or Orthoses: Knee Immobilizer - Right Knee Immobilizer - Right: On when out of bed or walking Restrictions Weight Bearing Restrictions: Yes RLE Weight Bearing: Weight bearing as tolerated   Pertinent Vitals/Pain 5 out 10    ADL  Grooming: Wash/dry face;Teeth care;Denture care;Supervision/safety Where Assessed - Grooming: Unsupported standing Toilet Transfer: Minimal assistance Toilet Transfer Method: Sit to stand Toilet Transfer Equipment: Raised toilet seat with arms (or 3-in-1 over toilet) Toileting - Clothing Manipulation and Hygiene: Minimal assistance Where Assessed - Toileting Clothing Manipulation and Hygiene: Sit to stand from 3-in-1 or toilet Equipment Used: Knee Immobilizer;Gait belt;Rolling walker Transfers/Ambulation Related to ADLs: pt ambulated ~10 ft during session at min (A) level. pt educated on backward walking with RW ADL Comments: Pt in CPM on arrival s/p 2 hours in machine. pt educated on 2 hours on and 6 hours total during the day. Pt using teach back able to return demo. Pt completed bed mobility, toilet transfer at EOB and sink level grooming. Pt repositioned supine with ice at end of session. Pt requesting to speak to director at end of shift and Artist informed.     OT  Diagnosis: Generalized weakness;Acute pain  OT Problem List: Decreased strength;Decreased activity tolerance;Impaired balance (sitting and/or standing);Decreased safety awareness;Decreased knowledge of use of DME or AE;Decreased knowledge of precautions;Pain OT Treatment Interventions: Self-care/ADL training;DME and/or AE instruction;Therapeutic activities;Patient/family education;Balance training   OT Goals Acute Rehab OT Goals OT Goal Formulation: With patient Time For Goal Achievement: 09/28/12 Potential to Achieve Goals: Good ADL Goals Pt Will Perform Lower Body Bathing: with modified independence;Sit to stand from chair (AE PRN) ADL Goal: Lower Body Bathing - Progress: Goal set today Pt Will Perform Lower Body Dressing: with modified independence;Sit to stand from chair;with adaptive equipment ADL Goal: Lower Body Dressing - Progress: Goal set today Pt Will Transfer to Toilet: with modified independence;with DME;3-in-1 ADL Goal: Toilet Transfer - Progress: Goal set today Pt Will Perform Tub/Shower Transfer: with supervision;with DME;Ambulation ADL Goal: Tub/Shower Transfer - Progress: Goal set today Miscellaneous OT Goals Miscellaneous OT Goal #1: Pt will complete bed mobility with HOB flat no rails at MOD I level as precursor to adls OT Goal: Miscellaneous Goal #1 - Progress: Goal set today  Visit Information  Last OT Received On: 09/14/12 Assistance Needed: +1    Subjective Data  Subjective: "Why is this bleeding like this?"- pt with JP drain leaking out onto stocking Patient Stated Goal: I am nervous about going home tomorrow- pt provided reassurance that therapy would visit at least once from PT and once for OT prior to d/c so that she feels safe   Prior Functioning     Home Living Lives With: Spouse Available Help at Discharge: Family;Available 24 hours/day Type of Home: House Home Access: Stairs to enter [employer] of  Steps: 3 Entrance Stairs-Rails:  Left Home Layout: One level Bathroom Shower/Tub: Tub/shower unit;Curtain Bathroom Toilet: Standard Bathroom Accessibility: Yes How Accessible: Accessible via walker Home Adaptive Equipment: Bedside commode/3-in-1;Walker - rolling;Straight cane Prior Function Level of Independence: Independent;Needs assistance Needs Assistance: Light Housekeeping Light Housekeeping: Maximal Able to Take Stairs?: Yes Driving: No Communication Communication: No difficulties Dominant Hand: Right         Vision/Perception     Cognition  Overall Cognitive Status: Appears within functional limits for tasks assessed/performed Arousal/Alertness: Awake/alert Orientation Level: Appears intact for tasks assessed Behavior During Session: [facility] for tasks performed    Extremity/Trunk Assessment Right Upper Extremity Assessment RUE ROM/Strength/Tone: [facility] for tasks assessed Left Upper Extremity Assessment LUE ROM/Strength/Tone: [facility] for tasks assessed Trunk Assessment Trunk Assessment: Normal     Mobility Bed Mobility Bed Mobility: Supine to Sit;Sitting - Scoot to Edge of Bed;Sit to Supine Supine to Sit: 4: Min guard;HOB elevated Sitting - Scoot to [place] of Bed: 4: Min assist;With rail Sit to Supine: 4: Min assist;With rail;HOB elevated Details for Bed Mobility Assistance: min v/c for sequence Transfers Sit to Stand: 4: Min guard;With upper extremity assist;From bed Stand to Sit: 4: Min guard;With upper extremity assist;To bed Details for Transfer Assistance: (A) for Rt LE      Shoulder Instructions     Exercise     Balance Balance Balance Assessed: No   End of Session OT - End of Session Activity Tolerance: Patient tolerated treatment well Patient left: in bed;with call bell/phone within reach Nurse Communication: Mobility status;Precautions CPM Right Knee CPM Right Knee: off   GO     Harrel Carina [facility] 09/14/2012, 4:05 PM Pager: [phone]

## 2012-09-14 NOTE — Progress Notes (Signed)
Physical Therapy Treatment Patient Details Name: Abigail Collins MRN: [id number] DOB: 1951-05-17 Today's Date: 09/14/2012 Time: 0830-0908 PT Time Calculation (min): 38 min  PT Assessment / Plan / Recommendation Comments on Treatment Session  Pt admitted s/p right TKA and continues to progress with therapy. Pt motivated and able to tolerate ambulation this am as well as therapeutic exercises.    Follow Up Recommendations  Home health PT;Supervision/Assistance - 24 hour     Does the patient have the potential to tolerate intense rehabilitation     Barriers to Discharge        Equipment Recommendations  None recommended by PT    Recommendations for Other Services    Frequency 7X/week   Plan Discharge plan remains appropriate;Frequency remains appropriate    Precautions / Restrictions Precautions Precautions: Knee Precaution Booklet Issued: No Required Braces or Orthoses: Knee Immobilizer - Right Knee Immobilizer - Right: On when out of bed or walking Restrictions Weight Bearing Restrictions: Yes RLE Weight Bearing: Weight bearing as tolerated   Pertinent Vitals/Pain 5/10 in right knee. Pt repositioned.    Mobility  Bed Mobility Bed Mobility: Supine to Sit Supine to Sit: 4: Min assist;HOB flat Details for Bed Mobility Assistance: Assist for right LE due to pain. Cues for safest sequence. Transfers Transfers: Sit to Stand;Stand to Sit (2 trials.) Sit to Stand: 1: +2 Total assist;With upper extremity assist;From bed;From chair/3-in-1 Sit to Stand: Patient Percentage: 70% Stand to Sit: 1: +2 Total assist;With upper extremity assist;To chair/3-in-1 Stand to Sit: Patient Percentage: 80% Details for Transfer Assistance: Assist for balance with cues for safest hand/right LE placement.  Ambulation/Gait Ambulation/Gait Assistance: 4: Min assist Ambulation Distance (Feet): 20 Feet Assistive device: Rolling walker Ambulation/Gait Assistance Details: Assist for balance and to  off weight right LE due to pain. Cues for tall posture and safe sequence. Gait Pattern: Step-to pattern;Decreased step length - right;Decreased stance time - right;Trunk flexed Stairs: No Wheelchair Mobility Wheelchair Mobility: No    Exercises Total Joint Exercises Ankle Circles/Pumps: AROM;Right;10 reps;Supine Quad Sets: AROM;Right;10 reps;Supine Heel Slides: AAROM;Right;10 reps;Supine Hip ABduction/ADduction: AAROM;Right;10 reps;Supine Straight Leg Raises: AAROM;Right;10 reps;Supine Goniometric ROM: AA/ROM right knee 0-45 degrees.   PT Diagnosis:    PT Problem List:   PT Treatment Interventions:     PT Goals Acute Rehab PT Goals PT Goal Formulation: With patient/family Time For Goal Achievement: 09/20/12 Potential to Achieve Goals: Good PT Goal: Supine/Side to Sit - Progress: Progressing toward goal PT Goal: Sit to Stand - Progress: Progressing toward goal PT Goal: Ambulate - Progress: Progressing toward goal PT Goal: Perform Home Exercise Program - Progress: Progressing toward goal  Visit Information  Last PT Received On: 09/14/12 Assistance Needed: +1    Subjective Data  Subjective: "I am a little dizzy." Patient Stated Goal: To walk   Cognition  Overall Cognitive Status: Appears within functional limits for tasks assessed/performed Arousal/Alertness: Awake/alert Orientation Level: Appears intact for tasks assessed Behavior During Session: [facility] for tasks performed    Balance  Balance Balance Assessed: No  End of Session PT - End of Session Equipment Utilized During Treatment: Gait belt;Right knee immobilizer Activity Tolerance: Patient tolerated treatment well Patient left: in chair;with call bell/phone within reach Nurse Communication: Mobility status   GP     Cephus Shelling 09/14/2012, 9:13 AM  09/14/2012 Cephus Shelling, PT, DPT [phone]

## 2012-09-14 NOTE — Op Note (Signed)
TOTAL KNEE REPLACEMENT OPERATIVE NOTE:  09/13/2012  12:37 PM  PATIENT:  Abigail Collins  61 y.o. female  PRE-OPERATIVE DIAGNOSIS:  osteoarthritis right knee  POST-OPERATIVE DIAGNOSIS:  osteoarthritis right knee  PROCEDURE:  Procedure(s): TOTAL KNEE ARTHROPLASTY  SURGEON:  Surgeon(s): Dannielle Huh, MD  PHYSICIAN ASSISTANT: Altamese Cabal, [facility]  ANESTHESIA:   general  DRAINS: Hemovac and On-Q Marcaine Pain Pump  SPECIMEN: None  COUNTS:  Correct  TOURNIQUET:   Total Tourniquet Time Documented: Thigh (Right) - 53 minutes  DICTATION:  Indication for procedure:    The patient is a 61 y.o. female who has failed conservative treatment for osteoarthritis right knee.  Informed consent was obtained prior to anesthesia. The risks versus benefits of the operation were explain and in a way the patient can, and did, understand.   Description of procedure:     The patient was taken to the operating room and placed under anesthesia.  The patient was positioned in the usual fashion taking care that all body parts were adequately padded and/or protected.  I foley catheter was placed.  A tourniquet was applied and the leg prepped and draped in the usual sterile fashion.  The extremity was exsanguinated with the esmarch and tourniquet inflated to 350 mmHg.  Pre-operative range of motion was normal.  The knee was in 7 degree of significant valgus.  A midline incision approximately 6-7 inches long was made with a #10 blade.  A new blade was used to make a parapatellar arthrotomy going 2-3 cm into the quadriceps tendon, over the patella, and alongside the medial aspect of the patellar tendon.  A synovectomy was then performed with the #10 blade and forceps. I then elevated the deep MCL off the medial tibial metaphysis subperiosteally around to the semimembranosus attachment.    I everted the patella and used calipers to measure patellar thickness.  I used the reamer to ream down to appropriate  thickness to recreate the native thickness.  I then removed excess bone with the rongeur and sagittal saw.  I used the appropriately sized template and drilled the three lug holes.  I then put the trial in place and measured the thickness with the calipers to ensure recreation of the native thickness.  The trial was then removed and the patella subluxed and the knee brought into flexion.  A homan retractor was place to retract and protect the patella and lateral structures.  A Z-retractor was place medially to protect the medial structures.  The extra-medullary alignment system was used to make cut the tibial articular surface perpendicular to the anamotic axis of the tibia and in 3 degrees of posterior slope.  The cut surface and alignment jig was removed.  I then used the intramedullary alignment guide to make a 5 valgus cut on the distal femur.  I then marked out the epicondylar axis on the distal femur.  The posterior condylar axis measured 7 degrees.  I then used the anterior referencing sizer and measured the femur to be a size E.  The 4-In-1 cutting block was screwed into place in external rotation matching the posterior condylar angle, making our cuts perpendicular to the epicondylar axis.  Anterior, posterior and chamfer cuts were made with the sagittal saw.  The cutting block and cut pieces were removed.  A lamina spreader was placed in 90 degrees of flexion.  The ACL, PCL, menisci, and posterior condylar osteophytes were removed.  A 12 mm spacer blocked was found to offer good flexion and  extension gap balance after severe in degree releasing.   The scoop retractor was then placed and the femoral finishing block was pinned in place.  The small sagittal saw was used as well as the lug drill to finish the femur.  The block and cut surfaces were removed and the medullary canal hole filled with autograft bone from the cut pieces.  The tibia was delivered forward in deep flexion and external rotation.   A size 4 tray was selected and pinned into place centered on the medial 1/3 of the tibial tubercle.  The reamer and keel was used to prepare the tibia through the tray.    I then trialed with the size E femur, size 4 tibia, a 12 mm insert and the 32 patella.  I had excellent flexion/extension gap balance, excellent patella tracking.  Flexion was full and beyond 120 degrees; extension was zero.  These components were chosen and the staff opened them to me on the back table while the knee was lavaged copiously and the cement mixed.  I cemented in the components and removed all excess cement.  The polyethylene tibial component was snapped into place and the knee placed in extension while cement was hardening.  The capsule was infilltrated with 20cc of .25% Marcaine with epinephrine.  A hemovac was place in the joint exiting superolaterally.  A pain pump was place superomedially superficial to the arthrotomy.  Once the cement was hard, the tourniquet was let down.  Hemostasis was obtained.  The arthrotomy was closed with figure-8 #1 vicryl sutures.  The deep soft tissues were closed with #0 vicryls and the subcuticular layer closed with a running #2-0 vicryl.  The skin was reapproximated and closed with skin staples.  The wound was dressed with xeroform, 4 x4's, 2 ABD sponges, a single layer of webril and a TED stocking.   The patient was then awakened, extubated, and taken to the recovery room in stable condition.  BLOOD LOSS:  300cc DRAINS: 1 hemovac, 1 pain catheter COMPLICATIONS:  None.  PLAN OF CARE: Admit to inpatient   PATIENT DISPOSITION:  PACU - hemodynamically stable.   Delay start of Pharmacological VTE agent (>24hrs) due to surgical blood loss or risk of bleeding:  not applicable  Please fax a copy of this op note to my office at [phone] (please only include page 1 and 2 of the Case Information op note)

## 2012-09-15 LAB — CBC
HCT: 35.5 % — ABNORMAL LOW (ref 36.0–46.0)
Hemoglobin: 11.6 g/dL — ABNORMAL LOW (ref 12.0–15.0)
MCH: 30.9 pg (ref 26.0–34.0)
MCHC: 32.7 g/dL (ref 30.0–36.0)
MCV: 94.4 fL (ref 78.0–100.0)
Platelets: 137 10*3/uL — ABNORMAL LOW (ref 150–400)
RBC: 3.76 MIL/uL — ABNORMAL LOW (ref 3.87–5.11)
RDW: 13.1 % (ref 11.5–15.5)
WBC: 9.1 10*3/uL (ref 4.0–10.5)

## 2012-09-15 LAB — GLUCOSE, CAPILLARY
Glucose-Capillary: 240 mg/dL — ABNORMAL HIGH (ref 70–99)
Glucose-Capillary: 273 mg/dL — ABNORMAL HIGH (ref 70–99)
Glucose-Capillary: 227 mg/dL — ABNORMAL HIGH (ref 70–99)

## 2012-09-15 MED ORDER — OXYCODONE HCL 5 MG PO TABS: 5.0000 mg | ORAL_TABLET | ORAL | Status: DC | PRN

## 2012-09-15 MED ORDER — METHOCARBAMOL 500 MG PO TABS: 500.0000 mg | ORAL_TABLET | Freq: Four times a day (QID) | ORAL | Status: DC | PRN

## 2012-09-15 MED ORDER — LIRAGLUTIDE 18 MG/3ML ~~LOC~~ SOLN: 1.8000 mg | Freq: Every day | SUBCUTANEOUS | Status: DC

## 2012-09-15 MED ORDER — ENOXAPARIN SODIUM 40 MG/0.4ML ~~LOC~~ SOLN: 40.0000 mg | Freq: Two times a day (BID) | SUBCUTANEOUS | Status: DC

## 2012-09-15 MED ORDER — ENOXAPARIN SODIUM 40 MG/0.4ML ~~LOC~~ SOLN: 40.0000 mg | Freq: Every day | SUBCUTANEOUS | Status: DC

## 2012-09-15 MED ORDER — OXYCODONE HCL ER 10 MG PO T12A
10.0000 mg | EXTENDED_RELEASE_TABLET | Freq: Two times a day (BID) | ORAL | Status: DC
Start: 2012-09-15 — End: 2012-10-25

## 2012-09-15 NOTE — Progress Notes (Signed)
SPORTS MEDICINE AND JOINT REPLACEMENT  Georgena Spurling, MD   Altamese Cabal, PA-C [address] [place], [place], [place]  [address]                             [phone]   PROGRESS NOTE  Subjective:  negative for Chest Pain  negative for Shortness of Breath  negative for Nausea/Vomiting   negative for Calf Pain  negative for Bowel Movement   Tolerating Diet: yes         Patient reports pain as 4 on 0-10 scale.    Objective: Vital signs in last 24 hours:   Patient Vitals for the past 24 hrs:  BP Temp Temp src Pulse Resp SpO2  09/15/12 0929 - - - - - 94 %  09/15/12 0700 154/89 mmHg 98.2 F (36.8 C) Oral 117  - 92 %  09/15/12 0400 - - - - 16  96 %  09/15/12 0000 - - - - 18  97 %  09/14/12 2200 153/85 mmHg 98.1 F (36.7 C) Oral 110  - 96 %  09/14/12 2110 - - - - - 94 %  09/14/12 2000 - - - - 18  97 %  09/14/12 1400 123/74 mmHg 98.6 F (37 C) - 107  18  93 %    @flow {1959:LAST@   Intake/Output from previous day:   12/03 0701 - 12/04 0700 In: 240 [P.O.:240] Out: 400 [Urine:400]   Intake/Output this shift:   12/04 0701 - 12/04 1900 In: 200 [P.O.:200] Out: -    Intake/Output      12/03 0701 - 12/04 0700 12/04 0701 - 12/05 0700   P.O. 240 200   I.V.     Total Intake 240 200   Urine 400    Drains     Blood     Total Output 400    Net -160 +200        Urine Occurrence 1 x 1 x      LABORATORY DATA:  Basename 09/15/12 0800 09/14/12 0640  WBC 9.1 9.4  HGB 11.6* 12.5  HCT 35.5* 37.7  PLT 137* 157    Basename 09/14/12 0640  NA 138  K 4.0  CL 104  CO2 26  BUN 7  CREATININE 0.63  GLUCOSE 249*  CALCIUM 8.7   Lab Results  Component Value Date   INR 0.88 09/03/2012   INR 0.96 04/06/2012    Examination:  General appearance: alert, cooperative and no distress Extremities: Homans sign is negative, no sign of DVT  Wound Exam: clean, dry, intact   Drainage:  None: wound tissue dry  Motor Exam: EHL and FHL Intact  Sensory Exam: Deep Peroneal  normal  Vascular Exam:    Assessment:    2 Days Post-Op  Procedure(s) (LRB): TOTAL KNEE ARTHROPLASTY (Right)  ADDITIONAL DIAGNOSIS:  Active Problems:  * No active hospital problems. *   Acute Blood Loss Anemia   Plan: Physical Therapy as ordered Weight Bearing as Tolerated (WBAT)  DVT Prophylaxis:  Lovenox  DISCHARGE PLAN: Home  DISCHARGE NEEDS: HHPT, CPM, Walker and 3-in-1 comode seat         Krysia Zahradnik 09/15/2012, 1:29 PM

## 2012-09-15 NOTE — Progress Notes (Signed)
Occupational Therapy Treatment Patient Details Name: Abigail Collins MRN: [id number] DOB: 06/09/1951 Today's Date: 09/15/2012 Time: 2130-8657 OT Time Calculation (min): 19 min  OT Assessment / Plan / Recommendation Comments on Treatment Session Pt progressing with OT and at adequate level for d/c home. pt requesting to wait until 5pm when husband is off    Follow Up Recommendations  Home health OT    Barriers to Discharge       Equipment Recommendations  None recommended by OT    Recommendations for Other Services    Frequency Min 2X/week   Plan Discharge plan remains appropriate    Precautions / Restrictions Precautions Precautions: Knee Precaution Booklet Issued: No Required Braces or Orthoses: Knee Immobilizer - Right Knee Immobilizer - Right: On when out of bed or walking Restrictions Weight Bearing Restrictions: Yes RLE Weight Bearing: Weight bearing as tolerated   Pertinent Vitals/Pain No pain    ADL  Lower Body Dressing: Moderate assistance Where Assessed - Lower Body Dressing: Supported sitting (don KI) Toilet Transfer: Hydrographic surveyor Method: Sit to Barista: Raised toilet seat with arms (or 3-in-1 over toilet) Toileting - Clothing Manipulation and Hygiene: Min guard Where Assessed - Toileting Clothing Manipulation and Hygiene: Sit to stand from 3-in-1 or toilet Equipment Used: Knee Immobilizer;Rolling walker Transfers/Ambulation Related to ADLs: Pt ambulated supervision level with correct sequence with RW ADL Comments: Pt requesting to finish lunch on first attempt. Second attempt pt will ing to get up and complete bathroom transfer and finish session in CPM. Pt completed basic transfer min guard, toilet transfer min guard and then completed bed transfer min (A)l Pt in CPM 0-55 with ice .    OT Diagnosis:    OT Problem List:   OT Treatment Interventions:     OT Goals Acute Rehab OT Goals OT Goal Formulation: With  patient Time For Goal Achievement: 09/28/12 Potential to Achieve Goals: Good ADL Goals Pt Will Perform Lower Body Bathing: with modified independence;Sit to stand from chair ADL Goal: Lower Body Bathing - Progress: Progressing toward goals Pt Will Perform Lower Body Dressing: with modified independence;Sit to stand from chair;with adaptive equipment ADL Goal: Lower Body Dressing - Progress: Progressing toward goals Pt Will Transfer to Toilet: with modified independence;with DME;3-in-1 ADL Goal: Toilet Transfer - Progress: Progressing toward goals Pt Will Perform Tub/Shower Transfer: with supervision;with DME;Ambulation Miscellaneous OT Goals Miscellaneous OT Goal #1: Pt will complete bed mobility with HOB flat no rails at MOD I level as precursor to adls OT Goal: Miscellaneous Goal #1 - Progress: Progressing toward goals  Visit Information  Last OT Received On: 09/15/12 Assistance Needed: +1    Subjective Data      Prior Functioning       Cognition  Overall Cognitive Status: Appears within functional limits for tasks assessed/performed Arousal/Alertness: Awake/alert Orientation Level: Appears intact for tasks assessed Behavior During Session: [facility] for tasks performed    Mobility  Shoulder Instructions Bed Mobility Bed Mobility: Not assessed Sit to Supine: 4: Min assist;HOB flat Details for Bed Mobility Assistance: (A) to transfer Rt lE into bed Transfers Sit to Stand: 4: Min guard Stand to Sit: 4: Min guard          Balance Balance Balance Assessed: No   End of Session OT - End of Session Activity Tolerance: Patient tolerated treatment well Patient left: in bed;with call bell/phone within reach (CPM on) Nurse Communication: Mobility status;Precautions  GO     Lucile Shutters 09/15/2012, 1:12  PM Pager: [phone]

## 2012-09-15 NOTE — Progress Notes (Signed)
Physical Therapy Treatment Patient Details Name: Abigail Collins MRN: [id number] DOB: [birth year] Today's Date: 09/15/2012 Time: 4098-1191 PT Time Calculation (min): 30 min  PT Assessment / Plan / Recommendation Comments on Treatment Session  Pt admitted s/p right TKA and is motivated to work with therapy. Pt is able to increase ambulation distance and independence with mobility. Also tolerated stair negotiation. Pt ready for safe d/c home once medically cleared by MD.    Follow Up Recommendations  Home health PT;Supervision/Assistance - 24 hour     Does the patient have the potential to tolerate intense rehabilitation     Barriers to Discharge        Equipment Recommendations  None recommended by PT    Recommendations for Other Services    Frequency 7X/week   Plan Discharge plan remains appropriate;Frequency remains appropriate    Precautions / Restrictions Precautions Precautions: Knee Precaution Booklet Issued: No Knee Immobilizer - Right: On when out of bed or walking Restrictions Weight Bearing Restrictions: Yes RLE Weight Bearing: Weight bearing as tolerated   Pertinent Vitals/Pain 2/10 in right knee. Pt repositioned.    Mobility  Bed Mobility Bed Mobility: Not assessed Transfers Transfers: Sit to Stand;Stand to Sit (2 trials.) Sit to Stand: 5: Supervision;With upper extremity assist;From chair/3-in-1 Stand to Sit: 5: Supervision;With upper extremity assist;To chair/3-in-1 Details for Transfer Assistance: Verbal cues for safest hand/right LE placement. Ambulation/Gait Ambulation/Gait Assistance: 4: Min guard Ambulation Distance (Feet): 150 Feet Assistive device: Rolling walker Ambulation/Gait Assistance Details: Guarding for balance with cues for tall posture, safety with RW, and attempts at step-through sequence. Gait Pattern: Step-to pattern;Step-through pattern;Decreased step length - right;Decreased stance time - right;Antalgic;Trunk flexed Stairs:  Yes Stairs Assistance: 4: Min guard Stairs Assistance Details (indicate cue type and reason): Guarding for balance with cues for "up with good, down with bad." Stair Management Technique: One rail Left;Step to pattern;Forwards;Other (comment) (Unilateral HHA on right.) Number of Stairs: 2  Wheelchair Mobility Wheelchair Mobility: No    Exercises     PT Diagnosis:    PT Problem List:   PT Treatment Interventions:     PT Goals Acute Rehab PT Goals PT Goal Formulation: With patient/family Time For Goal Achievement: 09/20/12 Potential to Achieve Goals: Good PT Goal: Sit to Stand - Progress: Met PT Goal: Ambulate - Progress: Progressing toward goal PT Goal: Up/Down Stairs - Progress: Progressing toward goal  Visit Information  Last PT Received On: 09/15/12 Assistance Needed: +1    Subjective Data  Subjective: "I am tired this morning." Patient Stated Goal: To walk   Cognition  Overall Cognitive Status: Appears within functional limits for tasks assessed/performed Arousal/Alertness: Awake/alert Orientation Level: Appears intact for tasks assessed Behavior During Session: [facility] for tasks performed    Balance  Balance Balance Assessed: No  End of Session PT - End of Session Equipment Utilized During Treatment: Gait belt;Right knee immobilizer Activity Tolerance: Patient tolerated treatment well Patient left: in chair;with call bell/phone within reach Nurse Communication: Mobility status   GP     Cephus Shelling 09/15/2012, 8:59 AM  09/15/2012 Cephus Shelling, PT, DPT [phone]

## 2012-09-15 NOTE — Discharge Summary (Signed)
Abigail Spurling, MD   Abigail Cabal, PA-C [address] [place], [place], [place]  [address]                             [phone]  PATIENT ID: Abigail Collins        MRN:  [id number]          DOB/AGE: 60-Dec-1952 / 60 y.o.    DISCHARGE SUMMARY  ADMISSION DATE:    09/13/2012 DISCHARGE DATE:   09/15/2012   ADMISSION DIAGNOSIS: osteoarthritis right knee    DISCHARGE DIAGNOSIS:  osteoarthritis right knee    ADDITIONAL DIAGNOSIS: Active Problems:  * No active hospital problems. *   Past Medical History  Diagnosis Date  . Depression   . Thyroid disease 1960's    "don't have it now" (09/14/2012)  . Bulging disc     "lower"  . PONV (postoperative nausea and vomiting)   . Anginal pain     "left arm pain, sees Dr. Dione Housekeeper, had card cath 2013"  . Hyperlipidemia   . Hypertension     sees Dr. Nolon Bussing, primary  . Asthma     takes inhaler 2x day  . Bronchitis     hx of  . Urinary tract infection     hx of  . Anemia     hx of  . Exertional dyspnea     "sometimes laying down" (09/14/2012)  . Type II diabetes mellitus   . Carpal tunnel syndrome of left wrist   . Arthritis     "all over" (09/14/2012)  . Pneumonia 03/2012    PROCEDURE: Procedure(s): TOTAL KNEE ARTHROPLASTY on 09/13/2012  CONSULTS:     HISTORY:  See H&P in chart  HOSPITAL COURSE:  Abigail Collins is a 61 y.o. admitted on 09/13/2012 and found to have a diagnosis of osteoarthritis right knee.  After appropriate laboratory studies were obtained  they were taken to the operating room on 09/13/2012 and underwent Procedure(s): TOTAL KNEE ARTHROPLASTY.   They were given perioperative antibiotics:  Anti-infectives     Start     Dose/Rate Route Frequency Ordered Stop   09/13/12 1045   ceFAZolin (ANCEF) IVPB 1 g/50 mL premix        1 g 100 mL/hr over 30 Minutes Intravenous Every 6 hours 09/13/12 1040 09/13/12 2150   09/12/12 1158   ceFAZolin (ANCEF) IVPB 2 g/50 mL premix        2 g 100 mL/hr over 30 Minutes  Intravenous 60 min pre-op 09/12/12 1158 09/13/12 0739        .  Tolerated the procedure well.  Placed with a foley intraoperatively.  Given Ofirmev at induction and for 48 hours.    POD #1, allowed out of bed to a chair.  PT for ambulation and exercise program.  Foley D/C'd in morning.  IV saline locked.  O2 discontionued.  POD #2, continued PT and ambulation.   Hemovac pulled. .  The remainder of the hospital course was dedicated to ambulation and strengthening.   The patient was discharged on 2 Days Post-Op in  Good condition.  Blood products given:none  DIAGNOSTIC STUDIES: Recent vital signs: Patient Vitals for the past 24 hrs:  BP Temp Temp src Pulse Resp SpO2  09/15/12 0929 - - - - - 94 %  09/15/12 0700 154/89 mmHg 98.2 F (36.8 C) Oral 117  - 92 %  09/15/12 0400 - - - -  16  96 %  09/15/12 0000 - - - - 18  97 %  10/14/2012 2200 153/85 mmHg 98.1 F (36.7 C) Oral 110  - 96 %  14-Oct-2012 2110 - - - - - 94 %  10-14-2012 2000 - - - - 18  97 %  2012/10/14 1400 123/74 mmHg 98.6 F (37 C) - 107  18  93 %       Recent laboratory studies:  Basename 09/15/12 0800 2012-10-14 0640  WBC 9.1 9.4  HGB 11.6* 12.5  HCT 35.5* 37.7  PLT 137* 157    Basename 10-14-12 0640  NA 138  K 4.0  CL 104  CO2 26  BUN 7  CREATININE 0.63  GLUCOSE 249*  CALCIUM 8.7   Lab Results  Component Value Date   INR 0.88 09/03/2012   INR 0.96 04/06/2012     Recent Radiographic Studies :  No results found.  DISCHARGE INSTRUCTIONS: Discharge Orders    Future Orders Please Complete By Expires   Diet - low sodium heart healthy      Call MD / Call 911      Comments:   If you experience chest pain or shortness of breath, CALL 911 and be transported to the hospital emergency room.  If you develope a fever above 101 F, pus (white drainage) or increased drainage or redness at the wound, or calf pain, call your surgeon's office.   Constipation Prevention      Comments:   Drink plenty of fluids.  Prune juice  may be helpful.  You may use a stool softener, such as Colace (over the counter) 100 mg twice a day.  Use MiraLax (over the counter) for constipation as needed.   Increase activity slowly as tolerated      Driving restrictions      Comments:   No driving for 6 weeks   Lifting restrictions      Comments:   No lifting for 6 weeks   CPM      Comments:   Continuous passive motion machine (CPM):      Use the CPM from 0 to 90 for 6-8 hours per day.      You may increase by 10 per day.  You may break it up into 2 or 3 sessions per day.      Use CPM for 2 weeks or until you are told to stop.   TED hose      Comments:   Use stockings (TED hose) for 3 weeks on both leg(s).  You may remove them at night for sleeping.   Change dressing      Comments:   Change dressing on thursday, then change the dressing daily with sterile 4 x 4 inch gauze dressing and apply TED hose.  You may clean the incision with alcohol prior to redressing.   Do not put a pillow under the knee. Place it under the heel.         DISCHARGE MEDICATIONS:     Medication List     As of 09/15/2012  1:35 PM    TAKE these medications         albuterol 108 (90 BASE) MCG/ACT inhaler   Commonly known as: PROVENTIL HFA;VENTOLIN HFA   Inhale 2 puffs into the lungs every 6 (six) hours as needed. For shortness of breath.      enoxaparin 40 MG/0.4ML injection   Commonly known as: LOVENOX   Inject 0.4 mLs (40 mg total) into  the skin every 12 (twelve) hours.      ibuprofen 200 MG tablet   Commonly known as: ADVIL,MOTRIN   Take 200 mg by mouth every 6 (six) hours as needed. For pain.      Liraglutide 18 MG/3ML Soln   Inject 0.3 mLs (1.8 mg total) into the skin daily.      metFORMIN 1000 MG tablet   Commonly known as: GLUCOPHAGE   Take 1 tablet (1,000 mg total) by mouth 2 (two) times daily with a meal.      methocarbamol 500 MG tablet   Commonly known as: ROBAXIN   Take 1 tablet (500 mg total) by mouth every 6 (six) hours as  needed.      mometasone-formoterol 100-5 MCG/ACT Aero   Commonly known as: DULERA   Inhale 2 puffs into the lungs 2 (two) times daily.      oxyCODONE 5 MG immediate release tablet   Commonly known as: Oxy IR/ROXICODONE   Take 1-2 tablets (5-10 mg total) by mouth every 3 (three) hours as needed.      OxyCODONE 10 mg T12a   Commonly known as: OXYCONTIN   Take 1 tablet (10 mg total) by mouth every 12 (twelve) hours.        FOLLOW UP VISIT:       Follow-up Information    Follow up with Raymon Mutton, MD. Call on 09/27/2012.   Contact information:   [address] [place] [address] [phone]          DISPOSITION: home  CONDITION:  {Good  Abigail Collins 09/15/2012, 1:35 PM

## 2012-09-15 NOTE — Progress Notes (Signed)
Pt provided with discharge instructions and follow up appointment information. Given scripts. Pt in stable condition with no current changes. Is going home with husband.

## 2012-09-15 NOTE — Progress Notes (Signed)
CARE MANAGEMENT NOTE 09/15/2012  Patient:  Abigail Collins, Abigail Collins   Account Number:  [id number]  Date Initiated:  09/14/2012  Documentation initiated by:  Vance Peper  Subjective/Objective Assessment:   61 yr old  female s/p right total knee arthroplasty.     Action/Plan:   CM spoke with patient concerning Home Health and DME needs.   Anticipated DC Date:  09/15/2012   Anticipated DC Plan:  HOME W HOME HEALTH SERVICES      DC Planning Services  CM consult      [facility] Choice  HOME HEALTH   Choice offered to / List presented to:  C-1 Patient        HH arranged  HH-2 PT      [facility] agency  [facility]   Status of service:  Completed, signed off Medicare Important Message given?   (If response is "NO", the following Medicare IM given date fields will be blank) Date Medicare IM given:   Date Additional Medicare IM given:    Discharge Disposition:  HOME W HOME HEALTH SERVICES  Per UR Regulation:    If discussed at Long Length of Stay Meetings, dates discussed:    Comments:

## 2012-09-15 NOTE — Progress Notes (Signed)
PT Treatment Note:   09/15/12 1150  PT Visit Information  Last PT Received On 09/15/12  Assistance Needed +1  PT Time Calculation  PT Start Time 1120  PT Stop Time 1145  PT Time Calculation (min) 25 min  Subjective Data  Subjective "I feel much better now."  Patient Stated Goal To walk  Precautions  Precautions Knee  Precaution Booklet Issued No  Required Braces or Orthoses Knee Immobilizer - Right  Knee Immobilizer - Right On when out of bed or walking  Restrictions  Weight Bearing Restrictions Yes  RLE Weight Bearing WBAT  Cognition  Overall Cognitive Status Appears within functional limits for tasks assessed/performed  Arousal/Alertness Awake/alert  Orientation Level Appears intact for tasks assessed  Behavior During Session [facility] for tasks performed  Bed Mobility  Bed Mobility Not assessed  Transfers  Transfers Sit to Stand;Stand to Sit  Sit to Stand 6: Modified independent (Device/Increase time)  Stand to Sit 6: Modified independent (Device/Increase time)  Ambulation/Gait  Ambulation/Gait Assistance 5: Supervision  Ambulation Distance (Feet) 200 Feet  Assistive device Rolling walker  Ambulation/Gait Assistance Details Verbal cues for continued step-through sequence with initial contact on righ heel. Cues for safety with RW.  Gait Pattern Step-through pattern;Decreased step length - right;Decreased stance time - right;Trunk flexed  Stairs No  Wheelchair Mobility  Wheelchair Mobility No  Balance  Balance Assessed No  Exercises  Exercises Total Joint  Total Joint Exercises  Ankle Circles/Pumps AROM;Right;10 reps;Supine  Quad Sets AROM;Right;10 reps;Supine  Heel Slides AAROM;Right;10 reps;Supine  Hip ABduction/ADduction AAROM;Right;10 reps;Supine  Straight Leg Raises AAROM;Right;10 reps;Supine  Goniometric ROM AA/ROM right knee 0-55 degrees.  PT - End of Session  Equipment Utilized During Treatment Gait belt;Right knee immobilizer  Activity Tolerance Patient  tolerated treatment well  Patient left in chair;with call bell/phone within reach  Nurse Communication Mobility status  PT - Assessment/Plan  Comments on Treatment Session Pt admitted s/p right TKA and is progressing great. Pt able to tolerate increased ambulation distance/independence this session. Ready for safe d/c home once medically cleared by MD.  PT Plan Discharge plan remains appropriate;Frequency remains appropriate  PT Frequency 7X/week  Follow Up Recommendations Home health PT;Supervision/Assistance - 24 hour  Equipment Recommended None recommended by PT  Acute Rehab PT Goals  PT Goal Formulation With patient/family  Time For Goal Achievement 09/20/12  Potential to Achieve Goals Good  PT Goal: Sit to Stand - Progress Met  PT Goal: Ambulate - Progress Met  PT Goal: Perform Home Exercise Program - Progress Progressing toward goal  PT General Charges  $$ ACUTE PT VISIT 1 Procedure  PT Treatments  $Gait Training 8-22 mins  $Therapeutic Exercise 8-22 mins    09/15/2012 Cephus Shelling, PT, DPT [phone]

## 2012-10-05 ENCOUNTER — Ambulatory Visit: Payer: BC Managed Care – PPO | Admitting: Internal Medicine

## 2012-10-07 ENCOUNTER — Ambulatory Visit (INDEPENDENT_AMBULATORY_CARE_PROVIDER_SITE_OTHER): Payer: BC Managed Care – PPO | Admitting: Internal Medicine

## 2012-10-07 ENCOUNTER — Encounter: Payer: Self-pay | Admitting: Internal Medicine

## 2012-10-07 VITALS — BP 106/70 | HR 80 | Temp 97.4°F | Resp 16 | Wt 228.0 lb

## 2012-10-07 DIAGNOSIS — E1165 Type 2 diabetes mellitus with hyperglycemia: Secondary | ICD-10-CM

## 2012-10-07 DIAGNOSIS — T148XXA Other injury of unspecified body region, initial encounter: Secondary | ICD-10-CM

## 2012-10-07 DIAGNOSIS — IMO0002 Reserved for concepts with insufficient information to code with codable children: Secondary | ICD-10-CM

## 2012-10-07 DIAGNOSIS — E1142 Type 2 diabetes mellitus with diabetic polyneuropathy: Secondary | ICD-10-CM

## 2012-10-07 DIAGNOSIS — E1149 Type 2 diabetes mellitus with other diabetic neurological complication: Secondary | ICD-10-CM

## 2012-10-07 MED ORDER — METHOCARBAMOL 750 MG PO TABS: 750.0000 mg | ORAL_TABLET | Freq: Four times a day (QID) | ORAL | Status: DC

## 2012-10-07 MED ORDER — METFORMIN HCL 1000 MG PO TABS: 1000.0000 mg | ORAL_TABLET | Freq: Two times a day (BID) | ORAL | Status: DC

## 2012-10-07 MED ORDER — OXYCODONE-ACETAMINOPHEN 5-325 MG PO TABS: 1.0000 | ORAL_TABLET | Freq: Three times a day (TID) | ORAL | Status: DC | PRN

## 2012-10-07 NOTE — Patient Instructions (Signed)
Back Exercises These exercises may help you when beginning to rehabilitate your injury. Your symptoms may resolve with or without further involvement from your physician, physical therapist or athletic trainer. While completing these exercises, remember:    Restoring tissue flexibility helps normal motion to return to the joints. This allows healthier, less painful movement and activity.   An effective stretch should be held for at least 30 seconds.   A stretch should never be painful. You should only feel a gentle lengthening or release in the stretched tissue.  STRETCH  Extension, Prone on Elbows   Lie on your stomach on the floor, a bed will be too soft. Place your palms about shoulder width apart and at the height of your head.   Place your elbows under your shoulders. If this is too painful, stack pillows under your chest.   Allow your body to relax so that your hips drop lower and make contact more completely with the floor.   Hold this position for __________ seconds.   Slowly return to lying flat on the floor.  Repeat __________ times. Complete this exercise __________ times per day.   RANGE OF MOTION  Extension, Prone Press Ups   Lie on your stomach on the floor, a bed will be too soft. Place your palms about shoulder width apart and at the height of your head.   Keeping your back as relaxed as possible, slowly straighten your elbows while keeping your hips on the floor. You may adjust the placement of your hands to maximize your comfort. As you gain motion, your hands will come more underneath your shoulders.   Hold this position __________ seconds.   Slowly return to lying flat on the floor.  Repeat __________ times. Complete this exercise __________ times per day.   RANGE OF MOTION- Quadruped, Neutral Spine   Assume a hands and knees position on a firm surface. Keep your hands under your shoulders and your knees under your hips. You may place padding under your knees for  comfort.   Drop your head and point your tail bone toward the ground below you. This will round out your low back like an angry cat. Hold this position for __________ seconds.   Slowly lift your head and release your tail bone so that your back sags into a large arch, like an old horse.   Hold this position for __________ seconds.   Repeat this until you feel limber in your low back.   Now, find your "sweet spot." This will be the most comfortable position somewhere between the two previous positions. This is your neutral spine. Once you have found this position, tense your stomach muscles to support your low back.   Hold this position for __________ seconds.  Repeat __________ times. Complete this exercise __________ times per day.   STRETCH  Flexion, Single Knee to Chest   Lie on a firm bed or floor with both legs extended in front of you.   Keeping one leg in contact with the floor, bring your opposite knee to your chest. Hold your leg in place by either grabbing behind your thigh or at your knee.   Pull until you feel a gentle stretch in your low back. Hold __________ seconds.   Slowly release your grasp and repeat the exercise with the opposite side.  Repeat __________ times. Complete this exercise __________ times per day.   STRETCH - Hamstrings, Standing  Stand or sit and extend your right / left leg, placing  your foot on a chair or foot stool   Keeping a slight arch in your low back and your hips straight forward.   Lead with your chest and lean forward at the waist until you feel a gentle stretch in the back of your right / left knee or thigh. (When done correctly, this exercise requires leaning only a small distance.)   Hold this position for __________ seconds.  Repeat __________ times. Complete this stretch __________ times per day. STRENGTHENING  Deep Abdominals, Pelvic Tilt   Lie on a firm bed or floor. Keeping your legs in front of you, bend your knees so they are  both pointed toward the ceiling and your feet are flat on the floor.   Tense your lower abdominal muscles to press your low back into the floor. This motion will rotate your pelvis so that your tail bone is scooping upwards rather than pointing at your feet or into the floor.   With a gentle tension and even breathing, hold this position for __________ seconds.  Repeat __________ times. Complete this exercise __________ times per day.   STRENGTHENING  Abdominals, Crunches   Lie on a firm bed or floor. Keeping your legs in front of you, bend your knees so they are both pointed toward the ceiling and your feet are flat on the floor. Cross your arms over your chest.   Slightly tip your chin down without bending your neck.   Tense your abdominals and slowly lift your trunk high enough to just clear your shoulder blades. Lifting higher can put excessive stress on the low back and does not further strengthen your abdominal muscles.   Control your return to the starting position.  Repeat __________ times. Complete this exercise __________ times per day.   STRENGTHENING  Quadruped, Opposite UE/LE Lift   Assume a hands and knees position on a firm surface. Keep your hands under your shoulders and your knees under your hips. You may place padding under your knees for comfort.   Find your neutral spine and gently tense your abdominal muscles so that you can maintain this position. Your shoulders and hips should form a rectangle that is parallel with the floor and is not twisted.   Keeping your trunk steady, lift your right hand no higher than your shoulder and then your left leg no higher than your hip. Make sure you are not holding your breath. Hold this position __________ seconds.   Continuing to keep your abdominal muscles tense and your back steady, slowly return to your starting position. Repeat with the opposite arm and leg.  Repeat __________ times. Complete this exercise __________ times per  day. Document Released: 10/17/2005 Document Revised: 12/22/2011 Document Reviewed: 01/11/2009 [facility] Patient Information 2013 [place], [place].

## 2012-10-07 NOTE — Progress Notes (Signed)
Subjective:    Patient ID: Abigail Collins, female    DOB: 03-Jan-1951, 61 y.o.   MRN: [id number]  HPI  Pt presents to the clinic today with c/o back pain. This started 4 days ago. She was treated for a UTI by her ortho doctor but the pain in her back has not gotten any better. The pain is 8/10 and worse when she is up and moving. The pain is better when she is laying down. She denies having an injury to the area. The pain is constant. She is taking Robaxin but she ran out of that. She has no more oxycontin since her surgery. She does take oxycodone but all that does is put her to sleep.  Review of Systems      Past Medical History  Diagnosis Date  . Depression   . Thyroid disease 1960's    "don't have it now" (09/14/2012)  . Bulging disc     "lower"  . PONV (postoperative nausea and vomiting)   . Anginal pain     "left arm pain, sees Dr. Dione Housekeeper, had card cath 2013"  . Hyperlipidemia   . Hypertension     sees Dr. Nolon Bussing, primary  . Asthma     takes inhaler 2x day  . Bronchitis     hx of  . Urinary tract infection     hx of  . Anemia     hx of  . Exertional dyspnea     "sometimes laying down" (09/14/2012)  . Type II diabetes mellitus   . Carpal tunnel syndrome of left wrist   . Arthritis     "all over" (09/14/2012)  . Pneumonia 03/2012    Current Outpatient Prescriptions  Medication Sig Dispense Refill  . albuterol (PROVENTIL HFA;VENTOLIN HFA) 108 (90 BASE) MCG/ACT inhaler Inhale 2 puffs into the lungs every 6 (six) hours as needed. For shortness of breath.      . enoxaparin (LOVENOX) 40 MG/0.4ML injection Inject 0.4 mLs (40 mg total) into the skin daily.  12 Syringe  0  . ibuprofen (ADVIL,MOTRIN) 200 MG tablet Take 200 mg by mouth every 6 (six) hours as needed. For pain.      . Liraglutide (VICTOZA) 18 MG/3ML SOLN Inject 0.3 mLs (1.8 mg total) into the skin daily.  6 mL  11  . metFORMIN (GLUCOPHAGE) 1000 MG tablet Take 1 tablet (1,000 mg total) by mouth 2 (two)  times daily with a meal.  180 tablet  1  . methocarbamol (ROBAXIN) 500 MG tablet Take 1 tablet (500 mg total) by mouth every 6 (six) hours as needed.  60 tablet  0  . mometasone-formoterol (DULERA) 100-5 MCG/ACT AERO Inhale 2 puffs into the lungs 2 (two) times daily.  3 Inhaler  0  . oxyCODONE (OXY IR/ROXICODONE) 5 MG immediate release tablet Take 1-2 tablets (5-10 mg total) by mouth every 3 (three) hours as needed.  90 tablet  0  . OxyCODONE (OXYCONTIN) 10 mg T12A Take 1 tablet (10 mg total) by mouth every 12 (twelve) hours.  30 tablet  0    Allergies  Allergen Reactions  . Lisinopril Shortness Of Breath    Family History  Problem Relation Age of Onset  . Arthritis Mother   . Arthritis Father   . Hypertension Father   . Diabetes Father     History   Social History  . Marital Status: Married    Spouse Name: N/A    Number of Children: N/A  .  Years of Education: N/A   Occupational History  . Not on file.   Social History Main Topics  . Smoking status: Former Smoker -- 0.2 packs/day for 20 years    Types: Cigarettes    Quit date: 10/13/2009  . Smokeless tobacco: Never Used  . Alcohol Use: Yes     Comment: 09/14/2012 "did drink a little in my younger days"  . Drug Use: Yes  . Sexually Active: Not Currently   Other Topics Concern  . Not on file   Social History Narrative  . No narrative on file     Constitutional: Denies fever, malaise, fatigue, headache or abrupt weight changes.  GU: Denies urgency, frequency, pain with urination, burning sensation, blood in urine, odor or discharge. Musculoskeletal: Pt reports back pain. Denies decrease in range of motion, difficulty with gait, or joint pain and swelling.  Skin: Denies redness, rashes, lesions or ulcercations.  Neurological: Denies numbness or tingling in her lower extremities, dizziness, difficulty with memory, difficulty with speech or problems with balance and coordination.   No other specific complaints in a  complete review of systems (except as listed in HPI above).  Objective:   Physical Exam   BP 106/70  Pulse 80  Temp 97.4 F (36.3 C) (Oral)  Resp 16  Wt 228 lb (103.42 kg) Wt Readings from Last 3 Encounters:  10/07/12 228 lb (103.42 kg)  09/10/12 237 lb (107.502 kg)  09/03/12 235 lb 10.8 oz (106.9 kg)    General: Appears her stated age, well developed, well nourished in NAD. Cardiovascular: Normal rate and rhythm. S1,S2 noted.  No murmur, rubs or gallops noted. No JVD or BLE edema. No carotid bruits noted. Pulmonary/Chest: Normal effort and positive vesicular breath sounds. No respiratory distress. No wheezes, rales or ronchi noted.  Abdomen: Soft and nontender. Normal bowel sounds, no bruits noted. No distention or masses noted. Liver, spleen and kidneys non palpable. Musculoskeletal: Decreasedl range of motion. Generalized tenderness to palpation along the bilateral lower back. No signs of joint swelling. No difficulty with gait.  Neurological: Alert and oriented. Cranial nerves II-XII intact. Coordination normal. +DTRs bilaterally.       Assessment & Plan:   Muscle Strain, new onset with additional workup required:  Continue taking Robaxin Will add percocet. Stop taking oxycodone and oxycontin Do stretching exercises as shown on handout  RTC as needed or if symptoms persist

## 2012-10-25 ENCOUNTER — Encounter: Payer: Self-pay | Admitting: Internal Medicine

## 2012-10-25 ENCOUNTER — Ambulatory Visit (INDEPENDENT_AMBULATORY_CARE_PROVIDER_SITE_OTHER): Payer: BC Managed Care – PPO | Admitting: Internal Medicine

## 2012-10-25 VITALS — BP 122/86 | HR 80 | Temp 97.0°F | Resp 16 | Wt 226.5 lb

## 2012-10-25 DIAGNOSIS — M5430 Sciatica, unspecified side: Secondary | ICD-10-CM

## 2012-10-25 DIAGNOSIS — M5416 Radiculopathy, lumbar region: Secondary | ICD-10-CM | POA: Insufficient documentation

## 2012-10-25 DIAGNOSIS — M5440 Lumbago with sciatica, unspecified side: Secondary | ICD-10-CM | POA: Insufficient documentation

## 2012-10-25 DIAGNOSIS — IMO0002 Reserved for concepts with insufficient information to code with codable children: Secondary | ICD-10-CM

## 2012-10-25 MED ORDER — OXYCODONE HCL 5 MG PO TABS: 5.0000 mg | ORAL_TABLET | ORAL | Status: DC | PRN

## 2012-10-25 NOTE — Patient Instructions (Signed)
Back Pain, Adult Low back pain is very common. About 1 in 5 people have back pain.The cause of low back pain is rarely dangerous. The pain often gets better over time.About half of people with a sudden onset of back pain feel better in just 2 weeks. About 8 in 10 people feel better by 6 weeks.  CAUSES Some common causes of back pain include:  Strain of the muscles or ligaments supporting the spine.  Wear and tear (degeneration) of the spinal discs.  Arthritis.  Direct injury to the back. DIAGNOSIS Most of the time, the direct cause of low back pain is not known.However, back pain can be treated effectively even when the exact cause of the pain is unknown.Answering your caregiver's questions about your overall health and symptoms is one of the most accurate ways to make sure the cause of your pain is not dangerous. If your caregiver needs more information, he or she may order lab work or imaging tests (X-rays or MRIs).However, even if imaging tests show changes in your back, this usually does not require surgery. HOME CARE INSTRUCTIONS For many people, back pain returns.Since low back pain is rarely dangerous, it is often a condition that people can learn to manageon their own.   Remain active. It is stressful on the back to sit or stand in one place. Do not sit, drive, or stand in one place for more than 30 minutes at a time. Take short walks on level surfaces as soon as pain allows.Try to increase the length of time you walk each day.  Do not stay in bed.Resting more than 1 or 2 days can delay your recovery.  Do not avoid exercise or work.Your body is made to move.It is not dangerous to be active, even though your back may hurt.Your back will likely heal faster if you return to being active before your pain is gone.  Pay attention to your body when you bend and lift. Many people have less discomfortwhen lifting if they bend their knees, keep the load close to their bodies,and  avoid twisting. Often, the most comfortable positions are those that put less stress on your recovering back.  Find a comfortable position to sleep. Use a firm mattress and lie on your side with your knees slightly bent. If you lie on your back, put a pillow under your knees.  Only take over-the-counter or prescription medicines as directed by your caregiver. Over-the-counter medicines to reduce pain and inflammation are often the most helpful.Your caregiver may prescribe muscle relaxant drugs.These medicines help dull your pain so you can more quickly return to your normal activities and healthy exercise.  Put ice on the injured area.  Put ice in a plastic bag.  Place a towel between your skin and the bag.  Leave the ice on for 15 to 20 minutes, 3 to 4 times a day for the first 2 to 3 days. After that, ice and heat may be alternated to reduce pain and spasms.  Ask your caregiver about trying back exercises and gentle massage. This may be of some benefit.  Avoid feeling anxious or stressed.Stress increases muscle tension and can worsen back pain.It is important to recognize when you are anxious or stressed and learn ways to manage it.Exercise is a great option. SEEK MEDICAL CARE IF:  You have pain that is not relieved with rest or medicine.  You have pain that does not improve in 1 week.  You have new symptoms.  You are generally   not feeling well. SEEK IMMEDIATE MEDICAL CARE IF:   You have pain that radiates from your back into your legs.  You develop new bowel or bladder control problems.  You have unusual weakness or numbness in your arms or legs.  You develop nausea or vomiting.  You develop abdominal pain.  You feel faint. Document Released: 09/29/2005 Document Revised: 03/30/2012 Document Reviewed: 02/17/2011 [employer] Patient Information 2013 [employer].  

## 2012-10-25 NOTE — Assessment & Plan Note (Signed)
She will have an MRI done to see if she has HNP, impingement, spinal stenosis, etc.

## 2012-10-25 NOTE — Assessment & Plan Note (Signed)
She will continue her current meds for pain  She wants to see pain management about possible getting ESI done She can't do PT at this time due to recent right knee surgery

## 2012-10-25 NOTE — Progress Notes (Signed)
Subjective:    Patient ID: Abigail Collins, female    DOB: June 24, 1951, 62 y.o.   MRN: [id number]  Back Pain This is a recurrent problem. The current episode started more than 1 month ago. The problem occurs intermittently. The problem is unchanged. The pain is present in the lumbar spine. The quality of the pain is described as stabbing and shooting. The pain radiates to the right thigh. The pain is at a severity of 6/10. The pain is moderate. The pain is worse during the day. The symptoms are aggravated by bending and position. Pertinent negatives include no abdominal pain, bladder incontinence, bowel incontinence, chest pain, dysuria, fever, headaches, leg pain, numbness, paresis, paresthesias, pelvic pain, perianal numbness, tingling, weakness or weight loss. Risk factors include lack of exercise, obesity and sedentary lifestyle. She has tried analgesics, NSAIDs and muscle relaxant for the symptoms. The treatment provided moderate relief.      Review of Systems  Constitutional: Negative.  Negative for fever and weight loss.  HENT: Negative.   Eyes: Negative.   Respiratory: Negative.   Cardiovascular: Negative.  Negative for chest pain and leg swelling.  Gastrointestinal: Negative.  Negative for abdominal pain and bowel incontinence.  Genitourinary: Negative.  Negative for bladder incontinence, dysuria and pelvic pain.  Musculoskeletal: Positive for back pain. Negative for myalgias, joint swelling, arthralgias and gait problem.  Skin: Negative.   Neurological: Negative for dizziness, tingling, tremors, weakness, light-headedness, numbness, headaches and paresthesias.  Hematological: Negative for adenopathy. Does not bruise/bleed easily.  Psychiatric/Behavioral: Negative.        Objective:   Physical Exam  Vitals reviewed. Constitutional: She appears well-developed and well-nourished. No distress.  HENT:  Head: Normocephalic and atraumatic.  Mouth/Throat: Oropharynx is clear and  moist. No oropharyngeal exudate.  Eyes: Conjunctivae normal are normal. Right eye exhibits no discharge. Left eye exhibits no discharge. No scleral icterus.  Neck: Normal range of motion. Neck supple. No JVD present. No tracheal deviation present. No thyromegaly present.  Cardiovascular: Normal rate, regular rhythm, normal heart sounds and intact distal pulses.  Exam reveals no gallop and no friction rub.   No murmur heard. Pulmonary/Chest: Effort normal and breath sounds normal. No stridor. No respiratory distress. She has no wheezes. She has no rales. She exhibits no tenderness.  Abdominal: Soft. Bowel sounds are normal. She exhibits no distension. There is no tenderness. There is no rebound and no guarding.  Musculoskeletal: Normal range of motion. She exhibits no edema and no tenderness.       Lumbar back: Normal. She exhibits normal range of motion, no tenderness, no bony tenderness, no swelling, no edema, no deformity, no laceration, no pain, no spasm and normal pulse.  Lymphadenopathy:    She has no cervical adenopathy.  Neurological: She is alert. She has normal strength. She displays no atrophy, no tremor and normal reflexes. No cranial nerve deficit or sensory deficit. She exhibits normal muscle tone. She displays a negative Romberg sign. She displays no seizure activity. Coordination and gait normal. She displays no Babinski's sign on the right side. She displays no Babinski's sign on the left side.  Reflex Scores:      Tricep reflexes are 1+ on the right side and 1+ on the left side.      Bicep reflexes are 1+ on the right side and 1+ on the left side.      Brachioradialis reflexes are 1+ on the right side and 1+ on the left side.      Patellar  reflexes are 1+ on the right side and 1+ on the left side.      Achilles reflexes are 1+ on the right side and 1+ on the left side.      - SLR in BLE  Skin: She is not diaphoretic.          Assessment & Plan:

## 2012-10-28 DIAGNOSIS — Z96659 Presence of unspecified artificial knee joint: Secondary | ICD-10-CM | POA: Insufficient documentation

## 2012-10-30 ENCOUNTER — Ambulatory Visit
Admission: RE | Admit: 2012-10-30 | Discharge: 2012-10-30 | Disposition: A | Discharge status: Home / Self Care | Payer: BC Managed Care – PPO | Source: Ambulatory Visit | Attending: Internal Medicine | Admitting: Internal Medicine

## 2012-10-30 DIAGNOSIS — M5416 Radiculopathy, lumbar region: Secondary | ICD-10-CM

## 2012-10-30 DIAGNOSIS — M5440 Lumbago with sciatica, unspecified side: Secondary | ICD-10-CM

## 2012-10-31 ENCOUNTER — Encounter: Payer: Self-pay | Admitting: Internal Medicine

## 2012-11-01 ENCOUNTER — Telehealth: Payer: Self-pay | Admitting: Internal Medicine

## 2012-11-01 ENCOUNTER — Other Ambulatory Visit: Payer: Self-pay | Admitting: Internal Medicine

## 2012-11-01 DIAGNOSIS — S32000A Wedge compression fracture of unspecified lumbar vertebra, initial encounter for closed fracture: Secondary | ICD-10-CM | POA: Insufficient documentation

## 2012-11-01 DIAGNOSIS — M5440 Lumbago with sciatica, unspecified side: Secondary | ICD-10-CM

## 2012-11-01 DIAGNOSIS — M5416 Radiculopathy, lumbar region: Secondary | ICD-10-CM

## 2012-11-01 NOTE — Telephone Encounter (Signed)
yes

## 2012-11-01 NOTE — Telephone Encounter (Signed)
Patient got the results of her MRI this morning but she forgot to ask if it is OK for her to continue her physical therapy?

## 2012-11-02 ENCOUNTER — Encounter: Payer: Self-pay | Admitting: Physical Medicine & Rehabilitation

## 2012-11-02 NOTE — Telephone Encounter (Signed)
Patient notified

## 2012-11-11 ENCOUNTER — Telehealth: Payer: Self-pay | Admitting: Internal Medicine

## 2012-11-11 NOTE — Telephone Encounter (Signed)
Patient is calling for a refill of the Oxycodone.  Pt was last seen in the office on 10/25/12 for back pain and had an RX written for 90 tablets.  Pt reports that prescription is about gone because she takes 2 every 3 hours PRN.  Pt states he was supposed to see another doctor last week but could not due to lack of insurance.  Pt reports she does have an appt at the pain clinic on Monday, 11/15/12.  OFFICE PLEASE FOLLOW UP WITH PT IF RX CAN BE SENT IN FOR HER.  PT USED [facility] OFF OF [address]

## 2012-11-12 ENCOUNTER — Other Ambulatory Visit: Payer: Self-pay | Admitting: Internal Medicine

## 2012-11-12 ENCOUNTER — Ambulatory Visit (HOSPITAL_COMMUNITY)
Admission: RE | Admit: 2012-11-12 | Discharge: 2012-11-12 | Disposition: A | Discharge status: Home / Self Care | Payer: BC Managed Care – PPO | Source: Ambulatory Visit | Attending: Internal Medicine | Admitting: Internal Medicine

## 2012-11-12 DIAGNOSIS — S32000A Wedge compression fracture of unspecified lumbar vertebra, initial encounter for closed fracture: Secondary | ICD-10-CM

## 2012-11-12 DIAGNOSIS — Z13820 Encounter for screening for osteoporosis: Secondary | ICD-10-CM | POA: Insufficient documentation

## 2012-11-12 DIAGNOSIS — S32009A Unspecified fracture of unspecified lumbar vertebra, initial encounter for closed fracture: Secondary | ICD-10-CM | POA: Insufficient documentation

## 2012-11-12 LAB — HM DEXA SCAN: HM Dexa Scan: -3.1

## 2012-11-15 ENCOUNTER — Encounter: Payer: Self-pay | Admitting: Physical Medicine & Rehabilitation

## 2012-11-15 ENCOUNTER — Encounter: Payer: BC Managed Care – PPO | Attending: Physical Medicine & Rehabilitation

## 2012-11-15 ENCOUNTER — Ambulatory Visit (HOSPITAL_BASED_OUTPATIENT_CLINIC_OR_DEPARTMENT_OTHER): Payer: BC Managed Care – PPO | Admitting: Physical Medicine & Rehabilitation

## 2012-11-15 VITALS — BP 166/98 | HR 102 | Resp 14 | Ht 63.5 in | Wt 230.0 lb

## 2012-11-15 DIAGNOSIS — Z96629 Presence of unspecified artificial elbow joint: Secondary | ICD-10-CM | POA: Insufficient documentation

## 2012-11-15 DIAGNOSIS — M51379 Other intervertebral disc degeneration, lumbosacral region without mention of lumbar back pain or lower extremity pain: Secondary | ICD-10-CM | POA: Insufficient documentation

## 2012-11-15 DIAGNOSIS — M545 Low back pain, unspecified: Secondary | ICD-10-CM | POA: Insufficient documentation

## 2012-11-15 DIAGNOSIS — IMO0002 Reserved for concepts with insufficient information to code with codable children: Secondary | ICD-10-CM

## 2012-11-15 DIAGNOSIS — M5431 Sciatica, right side: Secondary | ICD-10-CM

## 2012-11-15 DIAGNOSIS — S32000A Wedge compression fracture of unspecified lumbar vertebra, initial encounter for closed fracture: Secondary | ICD-10-CM

## 2012-11-15 DIAGNOSIS — E1165 Type 2 diabetes mellitus with hyperglycemia: Secondary | ICD-10-CM

## 2012-11-15 DIAGNOSIS — M5136 Other intervertebral disc degeneration, lumbar region: Secondary | ICD-10-CM

## 2012-11-15 DIAGNOSIS — X58XXXA Exposure to other specified factors, initial encounter: Secondary | ICD-10-CM | POA: Insufficient documentation

## 2012-11-15 DIAGNOSIS — E119 Type 2 diabetes mellitus without complications: Secondary | ICD-10-CM | POA: Insufficient documentation

## 2012-11-15 DIAGNOSIS — M5137 Other intervertebral disc degeneration, lumbosacral region: Secondary | ICD-10-CM | POA: Insufficient documentation

## 2012-11-15 DIAGNOSIS — J45909 Unspecified asthma, uncomplicated: Secondary | ICD-10-CM | POA: Insufficient documentation

## 2012-11-15 DIAGNOSIS — S32009A Unspecified fracture of unspecified lumbar vertebra, initial encounter for closed fracture: Secondary | ICD-10-CM | POA: Insufficient documentation

## 2012-11-15 DIAGNOSIS — Z5181 Encounter for therapeutic drug level monitoring: Secondary | ICD-10-CM

## 2012-11-15 DIAGNOSIS — Z96659 Presence of unspecified artificial knee joint: Secondary | ICD-10-CM | POA: Insufficient documentation

## 2012-11-15 DIAGNOSIS — M47817 Spondylosis without myelopathy or radiculopathy, lumbosacral region: Secondary | ICD-10-CM | POA: Insufficient documentation

## 2012-11-15 DIAGNOSIS — E785 Hyperlipidemia, unspecified: Secondary | ICD-10-CM | POA: Insufficient documentation

## 2012-11-15 DIAGNOSIS — M5430 Sciatica, unspecified side: Secondary | ICD-10-CM | POA: Insufficient documentation

## 2012-11-15 DIAGNOSIS — E1149 Type 2 diabetes mellitus with other diabetic neurological complication: Secondary | ICD-10-CM

## 2012-11-15 DIAGNOSIS — E1142 Type 2 diabetes mellitus with diabetic polyneuropathy: Secondary | ICD-10-CM

## 2012-11-15 DIAGNOSIS — I10 Essential (primary) hypertension: Secondary | ICD-10-CM | POA: Insufficient documentation

## 2012-11-15 MED ORDER — GABAPENTIN 100 MG PO CAPS: 100.0000 mg | ORAL_CAPSULE | Freq: Three times a day (TID) | ORAL | Status: DC

## 2012-11-15 NOTE — Patient Instructions (Addendum)
May do upright bicycling with seat raised high No seated stepper machine Epidural steroid injection one week Try gabapentin 1 tablet 3 times per day this may cause some drowsiness

## 2012-11-15 NOTE — Progress Notes (Signed)
Subjective:    Patient ID: Abigail Collins, female    DOB: 11-20-1950, 62 y.o.   MRN: [id number]  HPI Chief complaint: Low back pain around tailbone which goes into the right buttock as well as right posterior thigh. Onset 09/27/2012 gradually increasing over 2-3 days.  Was D/C ed from hospital 09/15/2012 After right total knee replacement.Occasional tingling in the right posterior thigh. Had a similar episode in 2005 which responded well to epidural steroid injections x2. Recent MRI demonstrating L2 compression fracture as well as mild DDD and foraminal stenosis at L4-5 graded as mild Blood sugars poorly controlled, 180-205  PSH:  09/13/2012 Pain Inventory Average Pain 10 Pain Right Now 10 My pain is aching  In the last 24 hours, has pain interfered with the following? General activity 10 Relation with others 10 Enjoyment of life 10 What TIME of day is your pain at its worst? morning and night Sleep (in general) Poor  Pain is worse with: walking, inactivity and some activites Pain improves with: rest, heat/ice, medication, TENS and injections Relief from Meds: 5  Mobility use a cane how many minutes can you walk? 5 ability to climb steps?  yes do you drive?  no  Function disabled: date disabled  I need assistance with the following:  bathing, meal prep, household duties and shopping  Neuro/Psych bowel control problems weakness trouble walking dizziness  Prior Studies Any changes since last visit?  no 1. Acute compression fracture of L2 without retropulsion or canal  compromise.  2. Degenerative lumbar spondylosis with disc disease and facet  disease.  3. Mild spinal, bilateral lateral recess and foraminal stenosis at  L4-5.  10/30/2012 Physicians involved in your care Any changes since last visit?  no   Family History  Problem Relation Age of Onset  . Arthritis Mother   . Arthritis Father   . Hypertension Father   . Diabetes Father    History   Social  History  . Marital Status: Married    Spouse Name: N/A    Number of Children: N/A  . Years of Education: N/A   Social History Main Topics  . Smoking status: Former Smoker -- 0.2 packs/day for 20 years    Types: Cigarettes    Quit date: 10/13/2009  . Smokeless tobacco: Never Used  . Alcohol Use: No     Comment: 09/14/2012 "did drink a little in my younger days"  . Drug Use: No  . Sexually Active: Not Currently   Other Topics Concern  . None   Social History Narrative  . None   Past Surgical History  Procedure Date  . Cardiac catheterization 05/13/2012    mod luminal irregularity of pLAD, no sign CAD, EF 65%.  . Total knee arthroplasty 09/13/2012    Procedure: TOTAL KNEE ARTHROPLASTY;  Surgeon: Dannielle Huh, MD;  Location: [facility] OR;  Service: Orthopedics;  Laterality: Right;  . Vaginal hysterectomy 1970's  . Cholecystectomy 1970's  . Tubal ligation 1970's  . Total elbow replacement ~ 2005    "right" (09/14/2012)   Past Medical History  Diagnosis Date  . Depression   . Thyroid disease 1960's    "don't have it now" (09/14/2012)  . Bulging disc     "lower"  . PONV (postoperative nausea and vomiting)   . Anginal pain     "left arm pain, sees Dr. Dione Housekeeper, had card cath 2013"  . Hyperlipidemia   . Hypertension     sees Dr. Nolon Bussing, primary  . Asthma  takes inhaler 2x day  . Bronchitis     hx of  . Urinary tract infection     hx of  . Anemia     hx of  . Exertional dyspnea     "sometimes laying down" (09/14/2012)  . Type II diabetes mellitus   . Carpal tunnel syndrome of left wrist   . Arthritis     "all over" (09/14/2012)  . Pneumonia 03/2012   BP 166/98  Pulse 102  Resp 14  Ht 5' 3.5" (1.613 m)  Wt 230 lb (104.327 kg)  BMI 40.10 kg/m2  SpO2 95%   Review of Systems  Constitutional: Positive for fever, chills and unexpected weight change.  Gastrointestinal: Positive for nausea.  Genitourinary:       Bladder control problems   Musculoskeletal: Positive  for back pain and gait problem.  Neurological: Positive for dizziness and weakness.       Objective:   Physical Exam  Nursing note and vitals reviewed. Constitutional: She is oriented to person, place, and time. She appears well-developed and well-nourished.  HENT:  Head: Normocephalic and atraumatic.  Eyes: EOM are normal. Pupils are equal, round, and reactive to light.  Neck: Normal range of motion. Neck supple.  Musculoskeletal:       Lumbar back: She exhibits decreased range of motion and tenderness. She exhibits no deformity.  Neurological: She is alert and oriented to person, place, and time. She has normal reflexes.  Skin: Skin is warm and dry.  Psychiatric: She has a normal mood and affect.   Mild tenderness over the L2 spinous process Moderate tenderness over the L4-L5 lumbar paraspinal muscles Right knee has a well-healed surgical incision, no knee effusion no erythema        Assessment & Plan:  1. Lumbar pain with sciatica. No clear-cut signs of radiculopathy. Has had previous excellent response to epidural steroid injections L4-5 level. Will repeat to enable patient to participate with her post TKR rehabilitation. 2. L2 compression fracture causing mild pain. Would recommend no bending and lifting. Would recommend no exercises that involve lumbar flexion and forceful hip extension. See her back in one week for the epidural injection

## 2012-11-23 ENCOUNTER — Ambulatory Visit: Payer: BC Managed Care – PPO | Admitting: Physical Medicine & Rehabilitation

## 2012-11-23 ENCOUNTER — Encounter: Payer: Self-pay | Admitting: Physical Medicine & Rehabilitation

## 2012-11-23 VITALS — BP 129/71 | HR 112 | Resp 14 | Wt 227.0 lb

## 2012-11-23 DIAGNOSIS — IMO0002 Reserved for concepts with insufficient information to code with codable children: Secondary | ICD-10-CM

## 2012-11-23 DIAGNOSIS — M5416 Radiculopathy, lumbar region: Secondary | ICD-10-CM

## 2012-11-23 MED ORDER — GABAPENTIN 100 MG PO CAPS: 300.0000 mg | ORAL_CAPSULE | Freq: Three times a day (TID) | ORAL | Status: DC

## 2012-11-23 NOTE — Patient Instructions (Addendum)
Gabapentin 300 mg instead of 100 mg take 3 times per day Performed L4-L5 epidural injection, Depo-Medrol and lidocaine injected. May take up to one week to become effective

## 2012-11-23 NOTE — Progress Notes (Signed)
  PROCEDURE RECORD [facility]   Name: KETURA SIREK DOB:1951-03-22 MRN: [id number]  Date:11/23/2012  Physician: Claudette Laws, MD    Nurse/CMA: Wynn Maudlin)  Allergies:  Allergies  Allergen Reactions  . Lisinopril Shortness Of Breath  . Percocet (Oxycodone-Acetaminophen) Nausea And Vomiting    Consent Signed: yes  Is patient diabetic? yes  CBG today? Did not check today  Pregnant: no LMP: No LMP recorded. Patient has had a hysterectomy. (age 29-55)  Anticoagulants: no Anti-inflammatory: no Antibiotics: no  Procedure: lumbar epidural steroid injection  Position: Prone Start Time: 3:47  End Time: 3:54 Fluoro Time: 20 seconds  RN/CMA Levens, CMA     Time 225p     BP 129/71 140/96    Pulse 112 113    Respirations 14 14    O2 Sat 97 96    S/S 6 6    Pain Level 10/10 10/10     D/C home with Husband-Samuel, patient A & O X 3, D/C instructions reviewed, and sits independently.

## 2012-11-23 NOTE — Progress Notes (Signed)
Right paramedian L4-L5 Lumbar epidural steroid injection under fluoroscopic guidance  Indication: Lumbosacral radiculitis is not relieved by medication management or other conservative care and interfering with self-care and mobility.  Informed consent was obtained after describing risk and benefits of the procedure with the patient, this includes bleeding, bruising, infection, paralysis and medication side effects.  The patient wishes to proceed and has given written consent.  Patient was placed in a prone position.  The lumbar area was marked and prepped with Betadine.  It was entered with a 25-gauge 1-1/2 inch needle and one mL of 1% lidocaine was injected into the skin and subcutaneous tissue.  Then a 18-gauge 6 inch spinal needle was inserted under fluoroscopic guidance into the Right L4-L5 interlaminar space under AP and Lateral imaging.  Once needle tip of approximated the posterior elements, a loss of resistance technique was utilized with lateral imaging.  A positive loss of resistance was obtained and then confirmed by injecting 2 mL's of Omnipaque 180.  Then a solution containing 2 mL's of 40 mg per mL depomedrol and 2 mL's of 1% lidocaine was injected.  The patient tolerated procedure well.  Post procedure instructions were given.  Please se post procedure form.

## 2012-12-06 ENCOUNTER — Encounter: Payer: Self-pay | Admitting: Physical Medicine & Rehabilitation

## 2012-12-06 ENCOUNTER — Ambulatory Visit (HOSPITAL_BASED_OUTPATIENT_CLINIC_OR_DEPARTMENT_OTHER): Payer: BC Managed Care – PPO | Admitting: Physical Medicine & Rehabilitation

## 2012-12-06 VITALS — BP 144/93 | HR 112 | Resp 14 | Ht 63.5 in | Wt 232.0 lb

## 2012-12-06 DIAGNOSIS — H6692 Otitis media, unspecified, left ear: Secondary | ICD-10-CM

## 2012-12-06 DIAGNOSIS — H6690 Otitis media, unspecified, unspecified ear: Secondary | ICD-10-CM

## 2012-12-06 DIAGNOSIS — M47817 Spondylosis without myelopathy or radiculopathy, lumbosacral region: Secondary | ICD-10-CM

## 2012-12-06 DIAGNOSIS — M810 Age-related osteoporosis without current pathological fracture: Secondary | ICD-10-CM

## 2012-12-06 MED ORDER — AMOXICILLIN 500 MG PO CAPS: 500.0000 mg | ORAL_CAPSULE | Freq: Three times a day (TID) | ORAL | Status: DC

## 2012-12-06 MED ORDER — TRAMADOL HCL 50 MG PO TABS: 50.0000 mg | ORAL_TABLET | Freq: Three times a day (TID) | ORAL | Status: DC | PRN

## 2012-12-06 NOTE — Patient Instructions (Addendum)
Calcium citrate with Vit D take one tablet twice a day Talk to Dr. Yetta Barre about treatment of osteoporosis,or if your ears not feeling any better on the left  medial branch blocks of the lumbar spine next available appointment if this doesn't work we will have another epidural

## 2012-12-06 NOTE — Progress Notes (Signed)
Subjective:    Patient ID: Abigail Collins, female    DOB: 01-Mar-1951, 62 y.o.   MRN: [id number] ow back pain around tailbone which goes into the right buttock as well as right posterior thigh. Onset 09/27/2012 gradually increasing over 2-3 days.  Was D/C ed from hospital 09/15/2012 After right total knee replacement.Occasional tingling in the right posterior thigh. Had a similar episode in 2005 which responded well to epidural steroid injections x2. Recent MRI demonstrating L2 compression fracture  pt recalls a "pop" while hospitalized as well as mild DDD and foraminal stenosis at L4-5 graded as mild  HPI Right paramedian L4-L5 Lumbar epidural steroid injection under fluoroscopic guidance 11/23/2012 Minimal improvement after epidural injection. Reviewed MRI results from January 2014. There is also evidence of lumbar spondylosis at L3-4 L4-5 and L5-S1 levels. Has not been able to do much physical therapy. Interval history DEXA scan demonstrating osteoporosis with T score less than -3 Pain Inventory Average Pain 10 Pain Right Now 7 My pain is stabbing  In the last 24 hours, has pain interfered with the following? General activity 2 Relation with others 1 Enjoyment of life 5 What TIME of day is your pain at its worst? all Sleep (in general) Fair  Pain is worse with: walking, sitting, inactivity and standing Pain improves with: rest, heat/ice and injections Relief from Meds: 2  Mobility use a cane ability to climb steps?  yes do you drive?  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  no  Physicians involved in your care Any changes since last visit?  no   Family History  Problem Relation Age of Onset  . Arthritis Mother   .  Arthritis Father   . Hypertension Father   . Diabetes Father    History   Social History  . Marital Status: Married    Spouse Name: N/A    Number of Children: N/A  . Years of Education: N/A   Social History Main Topics  . Smoking status: Former Smoker -- 0.25 packs/day for 20 years    Types: Cigarettes    Quit date: 10/13/2009  . Smokeless tobacco: Never Used  . Alcohol Use: No     Comment: 09/14/2012 "did drink a little in my younger days"  . Drug Use: No  . Sexually Active: Not Currently   Other Topics Concern  . None   Social History Narrative  . None   Past Surgical History  Procedure Laterality Date  . Cardiac catheterization  05/13/2012    mod luminal irregularity of pLAD, no sign CAD, EF 65%.  . Total knee arthroplasty  09/13/2012    Procedure: TOTAL KNEE ARTHROPLASTY;  Surgeon: Dannielle Huh, MD;  Location: [facility] OR;  Service: Orthopedics;  Laterality: Right;  . Vaginal hysterectomy  1970's  . Cholecystectomy  1970's  . Tubal ligation  1970's  . Total elbow replacement  ~ 2005    "right" (09/14/2012)   Past Medical History  Diagnosis Date  . Depression   . Thyroid disease 1960's    "don't have it now" (09/14/2012)  . Bulging disc     "lower"  . PONV (postoperative nausea and vomiting)   . Anginal pain     "left arm pain, sees Dr. Dione Housekeeper, had card cath 2013"  .  Hyperlipidemia   . Hypertension     sees Dr. Nolon Bussing, primary  . Asthma     takes inhaler 2x day  . Bronchitis     hx of  . Urinary tract infection     hx of  . Anemia     hx of  . Exertional dyspnea     "sometimes laying down" (09/14/2012)  . Type II diabetes mellitus   . Carpal tunnel syndrome of left wrist   . Arthritis     "all over" (09/14/2012)  . Pneumonia 03/2012   BP 144/93  Pulse 112  Resp 14  Ht 5' 3.5" (1.613 m)  Wt 232 lb (105.235 kg)  BMI 40.45 kg/m2  SpO2 95%   Review of Systems  Constitutional: Positive for appetite change.  Gastrointestinal: Positive for nausea.   Musculoskeletal: Positive for gait problem.  Neurological: Positive for weakness and numbness.       Tingling  All other systems reviewed and are negative.       Objective:   Physical Exam  HEENT left TM red loss of landmarks, right TM normal Back has no tenderness around L2. Has tenderness around the lumbosacral junction. Pain increases with both end range forward flexion as well as end range extension Lumbar spine range of motion is reduced Straight leg raising test is negative Deep tendon reflexes could not relax adequately to perform examination      Assessment & Plan:  1. Lumbar axial pain is not in the location of the compression fracture at L2. The L2 fracture is not bright on recent MRI indicating it is most likely to be chronic. Patient had minimal improvements with L4-L5 epidural injection. Will do medial branch blocks lidocaine only to minimize corticosteroid usage in this patient with newly diagnosed osteoporosis 2. Evidence of otitis media acute left ear. Amoxicillin 500 3 times a day x10 days, if no better in the next several days please contact primary physician We reviewed DEXA report,Evidence of osteoporosis, recommend calcium citrate plus D1 tablet twice a day. Any prescription medication treatment for this condition will come from primary care physician We reviewed MRI report Over half the 25 minute visit was spent counseling and coordinating care

## 2012-12-13 ENCOUNTER — Encounter: Payer: BC Managed Care – PPO | Attending: Physical Medicine & Rehabilitation

## 2012-12-13 ENCOUNTER — Ambulatory Visit (HOSPITAL_BASED_OUTPATIENT_CLINIC_OR_DEPARTMENT_OTHER): Payer: BC Managed Care – PPO | Admitting: Physical Medicine & Rehabilitation

## 2012-12-13 ENCOUNTER — Encounter: Payer: Self-pay | Admitting: Physical Medicine & Rehabilitation

## 2012-12-13 VITALS — BP 144/81 | HR 97 | Resp 14 | Ht 64.0 in | Wt 230.0 lb

## 2012-12-13 DIAGNOSIS — X58XXXA Exposure to other specified factors, initial encounter: Secondary | ICD-10-CM | POA: Insufficient documentation

## 2012-12-13 DIAGNOSIS — Z96629 Presence of unspecified artificial elbow joint: Secondary | ICD-10-CM | POA: Insufficient documentation

## 2012-12-13 DIAGNOSIS — M5137 Other intervertebral disc degeneration, lumbosacral region: Secondary | ICD-10-CM | POA: Insufficient documentation

## 2012-12-13 DIAGNOSIS — M545 Low back pain, unspecified: Secondary | ICD-10-CM | POA: Insufficient documentation

## 2012-12-13 DIAGNOSIS — J45909 Unspecified asthma, uncomplicated: Secondary | ICD-10-CM | POA: Insufficient documentation

## 2012-12-13 DIAGNOSIS — Z96659 Presence of unspecified artificial knee joint: Secondary | ICD-10-CM | POA: Insufficient documentation

## 2012-12-13 DIAGNOSIS — M47817 Spondylosis without myelopathy or radiculopathy, lumbosacral region: Secondary | ICD-10-CM | POA: Insufficient documentation

## 2012-12-13 DIAGNOSIS — I10 Essential (primary) hypertension: Secondary | ICD-10-CM | POA: Insufficient documentation

## 2012-12-13 DIAGNOSIS — S32009A Unspecified fracture of unspecified lumbar vertebra, initial encounter for closed fracture: Secondary | ICD-10-CM | POA: Insufficient documentation

## 2012-12-13 DIAGNOSIS — M51379 Other intervertebral disc degeneration, lumbosacral region without mention of lumbar back pain or lower extremity pain: Secondary | ICD-10-CM | POA: Insufficient documentation

## 2012-12-13 DIAGNOSIS — M5430 Sciatica, unspecified side: Secondary | ICD-10-CM | POA: Insufficient documentation

## 2012-12-13 DIAGNOSIS — E785 Hyperlipidemia, unspecified: Secondary | ICD-10-CM | POA: Insufficient documentation

## 2012-12-13 DIAGNOSIS — E119 Type 2 diabetes mellitus without complications: Secondary | ICD-10-CM | POA: Insufficient documentation

## 2012-12-13 MED ORDER — HYDROCODONE-ACETAMINOPHEN 5-325 MG PO TABS: 1.0000 | ORAL_TABLET | Freq: Four times a day (QID) | ORAL | Status: DC | PRN

## 2012-12-13 MED ORDER — HYDROCODONE-ACETAMINOPHEN 5-325 MG PO TABS: 1.0000 | ORAL_TABLET | Freq: Three times a day (TID) | ORAL | Status: DC | PRN

## 2012-12-13 NOTE — Progress Notes (Signed)
Bilateral Lumbar L3, L4  medial branch blocks and L 5 dorsal ramus injection under fluoroscopic guidance  Indication: Lumbar pain which is not relieved by medication management or other conservative care and interfering with self-care and mobility.  Informed consent was obtained after describing risks and benefits of the procedure with the patient, this includes bleeding, infection, paralysis and medication side effects.  The patient wishes to proceed and has given written consent.  The patient was placed in prone position.  The lumbar area was marked and prepped with Betadine.  One mL of 1% lidocaine was injected into each of 6 areas into the skin and subcutaneous tissue.  Then a 22-gauge 5 spinal needle was inserted targeting the junction of the left S1 superior articular process and sacral ala junction. Needle was advanced under fluoroscopic guidance.  Bone contact was made.  Omnipaque 180 was injected x 0.5 mL demonstrating no intravascular uptake.  Then a solution containing  2% MPF lidocaine was injected x 0.5 mL.  Then the left L5 superior articular process in transverse process junction was targeted.  Bone contact was made.  Omnipaque 180 was injected x 0.5 mL demonstrating no intravascular uptake  Then the left L4 superior articular process in transverse process junction was targeted.  Bone contact was made.  Omnipaque 180 was injected x 0.5 mL demonstrating no intravascular uptake. .  This same procedure was performed on the right side using the same needle, technique and injectate.  Patient tolerated procedure well.  Post procedure instructions were given. Pre-and post procedure pain level is 8/10.  Poor relief from tramadol, UDS consistent, denies any further prescriptions of oxycodone from primary physician. Denies any further prescriptions of oxycodone from orthopedic surgeon. Prescription for hydrocodone 5/325 3 times a day #90 no refill RTC 1 month

## 2012-12-13 NOTE — Progress Notes (Signed)
  PROCEDURE RECORD [facility]   Name: Abigail Collins DOB:12/27/1950 MRN: [id number]  Date:12/13/2012  Physician: Claudette Laws, MD    Nurse/CMA: Wynn Maudlin)  Allergies:  Allergies  Allergen Reactions  . Lisinopril Shortness Of Breath  . Percocet (Oxycodone-Acetaminophen) Nausea And Vomiting    Consent Signed: yes  Is patient diabetic? yes  CBG today? 153  Pregnant: no LMP: No LMP recorded. Patient has had a hysterectomy. (age 58-55)  Anticoagulants: no Anti-inflammatory: no Antibiotics: no  Procedure: Medial branch block  Position: Prone Start Time:  1216 End Time:  1232 Fluoro Time: 41  RN/CMA Kushi Kun, CMA Lory Galan,CMA    Time 11:27 1236    BP 144/81 157/94    Pulse 97 101    Respirations 14 14    O2 Sat 97 95    S/S 6 6    Pain Level 8/10 8/10     D/C home with Husband-Abigail Collins, patient A & O X 3, D/C instructions reviewed, and sits independently.

## 2012-12-13 NOTE — Patient Instructions (Signed)
Facet Block A facet block is an injection procedure used to numb nerves near a spinal joint (facet). The injection usually includes a medicine like Novacaine (anesthetic) and a steroid medicine (similar to cortisone). The injections are made directly into the facet joint of the back. They are used for patients with several types of neck or back pain problems (such as worsening arthritis or persistent pain after surgery) that have not been helped with anti-inflammatory medications, exercise programs, physical therapy, and other forms of pain management. Multiple injections may be needed depending on how many joints are involved.  A facet block procedure can be helpful with diagnosis as well as providing therapeutic pain relief. One of three things may happen after the procedure:  The pain does not go away. This can mean that the pain is probably not coming from blocked facet joints. This information is helpful with diagnosis. The pain goes away and stays away for a few hours but the original pain comes back and does not get better again. This information is also helpful with diagnosis. It can mean that pain is probably coming from the joints;If there is good, lasting benefit from the injections, the block may be repeated every three months. If there is good relief but it is only of short-term benefit, other procedures (such as radiofrequency lesioning) may be considered.  Note: The procedure cannot be performed if you have an active infection, a lesion on or near the area of injection, flu, cold, fever, very high blood pressure or if you are on blood thinners. Please make your doctor aware of any of these conditions. This is for your safety!  LET YOUR CAREGIVER KNOW ABOUT:   Allergies.  Medications taken including herbs, eye drops, over the counter medications, and creams  Use of steroids (by mouth or creams).  Possible pregnancy, if applicable.  Previous problems with anesthetics or  Novocaine.  History of blood clots.  History of bleeding or blood problems.  Previous surgery, particularly of the neck and/or back  Other health problems. RISKS AND COMPLICATIONS These are very uncommon but include:  Bleeding.  Injury to a nerve near the injection site.  Weakness or numbness in areas controlled by nerves near the injection site.  Infection.  Pain at the site of the injection.  Temporary fluid retention in those who are prone to this problem.  Allergic reaction to anesthetics or medicines used during the procedure. Diabetics may have a temporary increase in their blood sugar after any surgical procedure, especially if steroids are used. Stinging/burning of the numbing medicine is the most uncomfortable part of the procedure; however every person's response to any procedure is individual.  BEFORE THE PROCEDURE   Your caregiver will provide instructions about stopping any medication before the procedure.  Unless advised otherwise, if the injections are in your neck, you may take your medications as usual with a sip of water but do not eat or drink for 6 hours before the procedure.  Unless advised otherwise, you may eat, drink and take your medications as usual on the day of the procedure (both before and after) if the injections are to be in your lower back.  There is no other specific preparation necessary unless advised otherwise. PROCEDURE After checking your blood pressure, the procedure will be done in the x-ray (fluoroscopy) room while lying on your stomach. For procedures in the neck, an intravenous line is usually started. The back is then cleansed with an antiseptic soap. Sterile drapes are placed in  this area. The skin is numbed with a local anesthetic. This is felt as a stinging or burning sensation. Using x-ray guidance, needles are then advanced to the appropriate locations. Once the needles are in the proper location, the anesthetic and steroid is  injected through the needles and the needles are removed. The skin is then cleansed and bandages are applied. Blood pressure will be checked again, and you will be discharged to leave with your ride after your caregiver says it is okay to go.  AFTER THE PROCEDURE  You may not drive for the remainder of the day after your procedure. An adult must be present to drive you home or to go with you in a taxi or on public transportation. The procedure will be canceled if you do not have a responsible adult with you! This is for your safety.  HOME CARE INSTRUCTIONS   The bandages noted above can be removed on the morning after the procedure.  Resume medications according to your caregiver's instructions.  No heat is to be used near or over the injected area(s) for the remainder of the day.  No tub bath or soaking in water (such as a pool, jacuzzi, etc.) for the remainder of the day.  Some local tenderness may be experienced for a couple of days after the injection. Using an ice pack three or four times a day will help this.  Keep track of the amount of pain relief as well as how long the pain relief lasted. SEEK MEDICAL CARE IF:   There is drainage from the injection site.  Pain is not controlled with medications prescribed.  There is significant bleeding or swelling. SEEK IMMEDIATE MEDICAL CARE IF:   You develop a fever of 101 F (38.3 C) or greater.  Worsening pain, swelling, and/or red streaking develops in the skin around the injection site.  Severe pain develops and cannot be controlled with medications prescribed.  You develop any headache, stiff neck, nausea, vomiting, or your eyes become very sensitive to light.  Weakness or paralysis develops in arms or legs not present before the procedure.  You develop difficulty urinating or difficulty breathing. Document Released: 02/18/2007 Document Revised: 12/22/2011 Document Reviewed: 02/08/2009 [facility] Patient Information 2013  [place], [place].

## 2012-12-22 ENCOUNTER — Telehealth: Payer: Self-pay | Admitting: *Deleted

## 2012-12-22 NOTE — Telephone Encounter (Signed)
Patient says the shot has not helped.  She would like to come in for an epidural.  Advised patient to make appointment.

## 2012-12-22 NOTE — Telephone Encounter (Signed)
Hip pain

## 2012-12-31 ENCOUNTER — Ambulatory Visit (INDEPENDENT_AMBULATORY_CARE_PROVIDER_SITE_OTHER): Payer: BC Managed Care – PPO | Admitting: Internal Medicine

## 2012-12-31 ENCOUNTER — Encounter: Payer: Self-pay | Admitting: Internal Medicine

## 2012-12-31 ENCOUNTER — Other Ambulatory Visit (HOSPITAL_COMMUNITY)
Admission: RE | Admit: 2012-12-31 | Discharge: 2012-12-31 | Disposition: A | Discharge status: Home / Self Care | Payer: BC Managed Care – PPO | Source: Ambulatory Visit | Attending: Internal Medicine | Admitting: Internal Medicine

## 2012-12-31 ENCOUNTER — Other Ambulatory Visit: Payer: BC Managed Care – PPO

## 2012-12-31 VITALS — BP 112/68 | HR 98 | Temp 97.6°F | Resp 16 | Wt 233.4 lb

## 2012-12-31 DIAGNOSIS — I10 Essential (primary) hypertension: Secondary | ICD-10-CM

## 2012-12-31 DIAGNOSIS — N760 Acute vaginitis: Secondary | ICD-10-CM | POA: Insufficient documentation

## 2012-12-31 DIAGNOSIS — E1165 Type 2 diabetes mellitus with hyperglycemia: Secondary | ICD-10-CM

## 2012-12-31 DIAGNOSIS — H6692 Otitis media, unspecified, left ear: Secondary | ICD-10-CM

## 2012-12-31 DIAGNOSIS — Z01419 Encounter for gynecological examination (general) (routine) without abnormal findings: Secondary | ICD-10-CM | POA: Insufficient documentation

## 2012-12-31 DIAGNOSIS — B356 Tinea cruris: Secondary | ICD-10-CM

## 2012-12-31 DIAGNOSIS — M1711 Unilateral primary osteoarthritis, right knee: Secondary | ICD-10-CM

## 2012-12-31 DIAGNOSIS — H7290 Unspecified perforation of tympanic membrane, unspecified ear: Secondary | ICD-10-CM | POA: Insufficient documentation

## 2012-12-31 DIAGNOSIS — E1149 Type 2 diabetes mellitus with other diabetic neurological complication: Secondary | ICD-10-CM

## 2012-12-31 DIAGNOSIS — E1142 Type 2 diabetes mellitus with diabetic polyneuropathy: Secondary | ICD-10-CM

## 2012-12-31 DIAGNOSIS — H7292 Unspecified perforation of tympanic membrane, left ear: Secondary | ICD-10-CM

## 2012-12-31 DIAGNOSIS — M1710 Unilateral primary osteoarthritis, unspecified knee: Secondary | ICD-10-CM

## 2012-12-31 DIAGNOSIS — H6690 Otitis media, unspecified, unspecified ear: Secondary | ICD-10-CM | POA: Insufficient documentation

## 2012-12-31 DIAGNOSIS — IMO0002 Reserved for concepts with insufficient information to code with codable children: Secondary | ICD-10-CM

## 2012-12-31 LAB — WET PREP, GENITAL
Clue Cells Wet Prep HPF POC: NONE SEEN
Trich, Wet Prep: NONE SEEN
Yeast Wet Prep HPF POC: NONE SEEN

## 2012-12-31 LAB — HM DIABETES FOOT EXAM: HM Diabetic Foot Exam: NORMAL

## 2012-12-31 LAB — HM PAP SMEAR: HM Pap smear: NORMAL

## 2012-12-31 MED ORDER — KETOCONAZOLE 2 % EX CREA: TOPICAL_CREAM | Freq: Two times a day (BID) | CUTANEOUS | Status: DC

## 2012-12-31 MED ORDER — AMOXICILLIN 875 MG PO TABS: 875.0000 mg | ORAL_TABLET | Freq: Two times a day (BID) | ORAL | Status: DC

## 2012-12-31 MED ORDER — FLUCONAZOLE 100 MG PO TABS: 100.0000 mg | ORAL_TABLET | Freq: Every day | ORAL | Status: DC

## 2012-12-31 NOTE — Progress Notes (Signed)
Subjective:    Patient ID: Abigail Collins, female    DOB: 1951/07/10, 62 y.o.   MRN: [id number]  Rash This is a recurrent problem. The current episode started 1 to 4 weeks ago. The problem has been gradually worsening since onset. The affected locations include the groin. The rash is characterized by redness, scaling and itchiness. She was exposed to nothing. Associated symptoms include fatigue. Pertinent negatives include no anorexia, congestion, cough, diarrhea, eye pain, facial edema, fever, joint pain, nail changes, shortness of breath, sore throat or vomiting. Treatments tried: desitin. There is no history of allergies, asthma, eczema or varicella.      Review of Systems  Constitutional: Positive for fatigue. Negative for fever, chills, diaphoresis, activity change, appetite change and unexpected weight change.  HENT: Positive for hearing loss, ear pain and ear discharge (left). Negative for nosebleeds, congestion, sore throat, facial swelling, trouble swallowing, neck pain, neck stiffness, dental problem, sinus pressure and tinnitus.   Eyes: Negative.  Negative for pain.  Respiratory: Negative.  Negative for apnea, cough, choking, chest tightness, shortness of breath, wheezing and stridor.   Cardiovascular: Negative.  Negative for chest pain, palpitations and leg swelling.  Gastrointestinal: Negative.  Negative for nausea, vomiting, abdominal pain, diarrhea, constipation, anal bleeding and anorexia.  Endocrine: Negative.   Genitourinary: Negative.   Musculoskeletal: Positive for back pain and arthralgias. Negative for myalgias, joint pain, joint swelling and gait problem.  Skin: Positive for color change and rash. Negative for nail changes, pallor and wound.  Allergic/Immunologic: Negative.   Neurological: Negative.  Negative for dizziness, tremors, syncope, weakness, light-headedness, numbness and headaches.  Hematological: Positive for adenopathy. Does not bruise/bleed easily.    Psychiatric/Behavioral: Negative.        Objective:   Physical Exam  Vitals reviewed. Constitutional: She is oriented to person, place, and time. She appears well-developed and well-nourished.  Non-toxic appearance. She does not have a sickly appearance. She does not appear ill. No distress.  HENT:  Head: Normocephalic and atraumatic.  Right Ear: External ear and ear canal normal. No lacerations. No drainage, swelling or tenderness. No foreign bodies. No mastoid tenderness. Tympanic membrane is injected, erythematous and retracted. Tympanic membrane is not scarred, not perforated and not bulging. No middle ear effusion. No hemotympanum. Decreased hearing is noted.  Left Ear: No lacerations. There is drainage. No swelling or tenderness. No foreign bodies. No mastoid tenderness. Tympanic membrane is perforated. Tympanic membrane is not injected, not scarred, not erythematous, not retracted and not bulging. Tympanic membrane mobility is normal. A middle ear effusion is present. No hemotympanum. Decreased hearing is noted.  Mouth/Throat: Oropharynx is clear and moist. No oropharyngeal exudate.  Eyes: Conjunctivae are normal. Right eye exhibits no discharge. Left eye exhibits no discharge. No scleral icterus.  Neck: Normal range of motion. Neck supple. No JVD present. No tracheal deviation present. No thyromegaly present.  Cardiovascular: Normal rate, regular rhythm, normal heart sounds and intact distal pulses.  Exam reveals no gallop and no friction rub.   No murmur heard. Pulmonary/Chest: Effort normal and breath sounds normal. No stridor. No respiratory distress. She has no wheezes. She has no rales. She exhibits no tenderness.  Abdominal: Soft. Bowel sounds are normal. She exhibits no distension and no mass. There is no tenderness. There is no rebound and no guarding. Hernia confirmed negative in the right inguinal area and confirmed negative in the left inguinal area.  Genitourinary: Vagina  normal. Rectal exam shows no external hemorrhoid, no internal hemorrhoid, no fissure,  no mass, no tenderness and anal tone normal. Guaiac negative stool.    No labial fusion. There is rash on the right labia. There is no tenderness, lesion or injury on the right labia. There is rash on the left labia. There is no tenderness, lesion or injury on the left labia. Cervix exhibits no discharge. Right adnexum displays no mass, no tenderness and no fullness. Left adnexum displays no mass, no tenderness and no fullness. No erythema, tenderness or bleeding around the vagina. No foreign body around the vagina. No signs of injury around the vagina. No vaginal discharge found.  Musculoskeletal: Normal range of motion. She exhibits no edema and no tenderness.  Lymphadenopathy:    She has no cervical adenopathy.       Right: No inguinal adenopathy present.       Left: No inguinal adenopathy present.  Neurological: She is oriented to person, place, and time.  Skin: Skin is warm and dry. Rash noted. She is not diaphoretic. There is erythema. No pallor.     Psychiatric: She has a normal mood and affect. Her behavior is normal. Judgment and thought content normal.     Lab Results  Component Value Date   WBC 9.1 09/15/2012   HGB 11.6* 09/15/2012   HCT 35.5* 09/15/2012   PLT 137* 09/15/2012   GLUCOSE 249* 09/14/2012   CHOL 172 08/05/2012   TRIG 248.0* 08/05/2012   HDL 43.60 08/05/2012   LDLDIRECT 92.7 08/05/2012   LDLCALC UNABLE TO CALCULATE IF TRIGLYCERIDE OVER 400 mg/dL 4/54/0981   ALT 36* 19/14/7829   AST 38* 09/03/2012   NA 138 09/14/2012   K 4.0 09/14/2012   CL 104 09/14/2012   CREATININE 0.63 09/14/2012   BUN 7 09/14/2012   CO2 26 09/14/2012   TSH 2.60 08/05/2012   INR 0.88 09/03/2012   HGBA1C 8.0* 09/13/2012   MICROALBUR 3.0* 08/05/2012       Assessment & Plan:

## 2012-12-31 NOTE — Patient Instructions (Signed)
Ringworm, Body [Tinea Corporis] Ringworm is a fungal infection of the skin and hair. Another name for this problem is Tinea Corporis. It has nothing to do with worms. A fungus is an organism that lives on dead cells (the outer layer of skin). It can involve the entire body. It can spread from infected pets. Tinea corporis can be a problem in wrestlers who may get the infection form other players/opponents, equipment and mats. DIAGNOSIS  A skin scraping can be obtained from the affected area and by looking for fungus under the microscope. This is called a KOH examination.  HOME CARE INSTRUCTIONS   Ringworm may be treated with a topical antifungal cream, ointment, or oral medications.  If you are using a cream or ointment, wash infected skin. Dry it completely before application.  Scrub the skin with a buff puff or abrasive sponge using a shampoo with ketoconazole to remove dead skin and help treat the ringworm.  Have your pet treated by your veterinarian if it has the same infection. SEEK MEDICAL CARE IF:   Your ringworm patch (fungus) continues to spread after 7 days of treatment.  Your rash is not gone in 4 weeks. Fungal infections are slow to respond to treatment. Some redness (erythema) may remain for several weeks after the fungus is gone.  The area becomes red, warm, tender, and swollen beyond the patch. This may be a secondary bacterial (germ) infection.  You have a fever. Document Released: 09/26/2000 Document Revised: 12/22/2011 Document Reviewed: 03/09/2009 [employer] Patient Information 2013 [employer].  

## 2013-01-02 ENCOUNTER — Encounter: Payer: Self-pay | Admitting: Internal Medicine

## 2013-01-02 NOTE — Assessment & Plan Note (Signed)
Start amoxil She will keep the ear clean and dry (ie. No ear drops) ENT referral

## 2013-01-02 NOTE — Assessment & Plan Note (Signed)
Start ketoconazole and diflucan

## 2013-01-02 NOTE — Assessment & Plan Note (Signed)
I will recheck her a1c and will monitor her renal function today

## 2013-01-02 NOTE — Assessment & Plan Note (Signed)
Her BP is well controlled 

## 2013-01-02 NOTE — Assessment & Plan Note (Signed)
Start amoxil 

## 2013-01-02 NOTE — Assessment & Plan Note (Signed)
Start diflucan Check PAP smear and wet prep

## 2013-01-03 MED ORDER — DENOSUMAB 60 MG/ML ~~LOC~~ SOLN
60.0000 mg | Freq: Once | SUBCUTANEOUS | Status: AC
  Administered 2013-01-03: 60 mg via SUBCUTANEOUS

## 2013-01-03 NOTE — Addendum Note (Signed)
Addended by: Rock Nephew T on: 01/03/2013 02:58 PM   Modules accepted: Orders

## 2013-01-05 ENCOUNTER — Ambulatory Visit: Payer: BC Managed Care – PPO

## 2013-01-11 ENCOUNTER — Encounter: Payer: BC Managed Care – PPO | Attending: Physical Medicine & Rehabilitation

## 2013-01-11 ENCOUNTER — Ambulatory Visit (HOSPITAL_BASED_OUTPATIENT_CLINIC_OR_DEPARTMENT_OTHER): Payer: BC Managed Care – PPO | Admitting: Physical Medicine & Rehabilitation

## 2013-01-11 ENCOUNTER — Ambulatory Visit: Payer: BC Managed Care – PPO | Admitting: Physical Medicine & Rehabilitation

## 2013-01-11 ENCOUNTER — Encounter: Payer: Self-pay | Admitting: Physical Medicine & Rehabilitation

## 2013-01-11 VITALS — BP 143/89 | HR 92 | Resp 14 | Ht 64.0 in | Wt 231.2 lb

## 2013-01-11 DIAGNOSIS — M1710 Unilateral primary osteoarthritis, unspecified knee: Secondary | ICD-10-CM

## 2013-01-11 DIAGNOSIS — S32009A Unspecified fracture of unspecified lumbar vertebra, initial encounter for closed fracture: Secondary | ICD-10-CM | POA: Insufficient documentation

## 2013-01-11 DIAGNOSIS — X58XXXA Exposure to other specified factors, initial encounter: Secondary | ICD-10-CM | POA: Insufficient documentation

## 2013-01-11 DIAGNOSIS — M545 Low back pain, unspecified: Secondary | ICD-10-CM | POA: Insufficient documentation

## 2013-01-11 DIAGNOSIS — S32000D Wedge compression fracture of unspecified lumbar vertebra, subsequent encounter for fracture with routine healing: Secondary | ICD-10-CM

## 2013-01-11 DIAGNOSIS — IMO0001 Reserved for inherently not codable concepts without codable children: Secondary | ICD-10-CM | POA: Insufficient documentation

## 2013-01-11 DIAGNOSIS — IMO0002 Reserved for concepts with insufficient information to code with codable children: Secondary | ICD-10-CM

## 2013-01-11 DIAGNOSIS — M1711 Unilateral primary osteoarthritis, right knee: Secondary | ICD-10-CM

## 2013-01-11 DIAGNOSIS — M5137 Other intervertebral disc degeneration, lumbosacral region: Secondary | ICD-10-CM | POA: Insufficient documentation

## 2013-01-11 DIAGNOSIS — G8929 Other chronic pain: Secondary | ICD-10-CM

## 2013-01-11 DIAGNOSIS — M51379 Other intervertebral disc degeneration, lumbosacral region without mention of lumbar back pain or lower extremity pain: Secondary | ICD-10-CM | POA: Insufficient documentation

## 2013-01-11 MED ORDER — HYDROCODONE-ACETAMINOPHEN 5-325 MG PO TABS: 1.0000 | ORAL_TABLET | Freq: Three times a day (TID) | ORAL | Status: DC | PRN

## 2013-01-11 NOTE — Progress Notes (Signed)
Subjective:    Patient ID: Abigail Collins, female    DOB: [birth year], 62 y.o.   MRN: [id number]  HPI Lumbar medial branch blocks done on 12/13/2012 were not perfectly helpful for her pain. She thinks perhaps the epidural injections were more helpful however not really sure. Diagnosed with osteoporosis in the last month or 2. Has a small L2 Inferior endplate fracture Has been evaluated by neurosurgery, kyphoplasty recommended Pain Inventory Average Pain 5 Pain Right Now 5 My pain is stabbing and aching  In the last 24 hours, has pain interfered with the following? General activity 10 Relation with others 10 Enjoyment of life 10 What TIME of day is your pain at its worst? all Sleep (in general) Fair  Pain is worse with: walking, sitting, inactivity and some activites Pain improves with: rest and medication Relief from Meds: 6  Mobility use a cane how many minutes can you walk? 10 ability to climb steps?  yes do you drive?  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  no  Physicians involved in your care Any changes since last visit?  yes Neurosurgeon Lovell Sheehan   Family History  Problem Relation Age of Onset  . Arthritis Mother   . Arthritis Father   . Hypertension Father   . Diabetes Father    History   Social History  . Marital Status: Married    Spouse Name: N/A    Number of Children: N/A  . Years of Education: N/A   Social History Main Topics  . Smoking status: Former Smoker -- 0.25 packs/day for 20 years    Types: Cigarettes    Quit date: 10/13/2009  . Smokeless tobacco: Never Used  . Alcohol Use: No     Comment: 09/14/2012 "did drink a little in my younger days"   . Drug Use: No  . Sexually Active: Not Currently   Other Topics Concern  . None   Social History Narrative  . None   Past Surgical History  Procedure Laterality Date  . Cardiac catheterization  05/13/2012    mod luminal irregularity of pLAD, no sign CAD, EF 65%.  . Total knee arthroplasty  09/13/2012    Procedure: TOTAL KNEE ARTHROPLASTY;  Surgeon: Dannielle Huh, MD;  Location: [facility] OR;  Service: Orthopedics;  Laterality: Right;  . Vaginal hysterectomy  1970's  . Cholecystectomy  1970's  . Tubal ligation  1970's  . Total elbow replacement  ~ 2005    "right" (09/14/2012)   Past Medical History  Diagnosis Date  . Depression   . Thyroid disease 1960's    "don't have it now" (09/14/2012)  . Bulging disc     "lower"  . PONV (postoperative nausea and vomiting)   . Anginal pain     "left arm pain, sees Dr. Dione Housekeeper, had card cath 2013"  . Hyperlipidemia   . Hypertension     sees Dr. Nolon Bussing, primary  . Asthma     takes inhaler 2x day  . Bronchitis     hx of  . Urinary tract infection     hx of  . Anemia     hx of  . Exertional dyspnea     "sometimes laying down" (09/14/2012)  . Type II  diabetes mellitus   . Carpal tunnel syndrome of left wrist   . Arthritis     "all over" (09/14/2012)  . Pneumonia 03/2012   BP 143/89  Pulse 92  Resp 14  Ht 5\' 4"  (1.626 m)  Wt 231 lb 3.2 oz (104.872 kg)  BMI 39.67 kg/m2  SpO2 94%     Review of Systems  Constitutional: Positive for fever, chills, diaphoresis and unexpected weight change.  Gastrointestinal: Positive for nausea and vomiting.  Musculoskeletal: Positive for back pain and gait problem.  Neurological: Positive for dizziness, weakness and numbness.       Tingling  Psychiatric/Behavioral: Positive for dysphoric mood.  All other systems reviewed and are negative.       Objective:   Physical Exam  Nursing note and vitals reviewed. Constitutional: She is oriented to person, place, and time. She appears well-nourished.   HENT:  Head: Normocephalic and atraumatic.  Eyes: Conjunctivae and EOM are normal. Pupils are equal, round, and reactive to light.  Neck: Normal range of motion. Neck supple.  Musculoskeletal:       Lumbar back: She exhibits decreased range of motion.  Neurological: She is alert and oriented to person, place, and time. She has normal strength and normal reflexes. No sensory deficit.  Psychiatric: She has a normal mood and affect.         Assessment & Plan:  1. Lumbar pain multifactorial. She is degenerative disc, myofascial pain, L2 compression fracture. As I discussed with patient a kyphoplasty may help with a portion of her pain but unlikely to help with all of her back pain. She would like to try this and plans to followup with neurosurgery for this. Our right her pain medication for the next couple months. See her back in 2 months. She will likely need extra hydrocodone or oxycodone after her kyphoplasty procedure for a week or 2 and I'll leave it to neurosurgeon to prescribe this.

## 2013-01-11 NOTE — Patient Instructions (Addendum)
See me in 2 months. Do not except pain medicine from other doctors except right after your procedure Dr. Lovell Sheehan can prescribe some extra pain medication for a week or 2

## 2013-01-12 ENCOUNTER — Other Ambulatory Visit: Payer: Self-pay | Admitting: Neurosurgery

## 2013-01-12 ENCOUNTER — Encounter (HOSPITAL_COMMUNITY): Payer: Self-pay

## 2013-01-18 ENCOUNTER — Encounter (HOSPITAL_COMMUNITY): Payer: Self-pay

## 2013-01-18 ENCOUNTER — Encounter (HOSPITAL_COMMUNITY)
Admission: RE | Admit: 2013-01-18 | Discharge: 2013-01-18 | Disposition: A | Discharge status: Home / Self Care | Payer: BC Managed Care – PPO | Source: Ambulatory Visit | Attending: Neurosurgery | Admitting: Neurosurgery

## 2013-01-18 DIAGNOSIS — Z01812 Encounter for preprocedural laboratory examination: Secondary | ICD-10-CM | POA: Insufficient documentation

## 2013-01-18 HISTORY — DX: Vomiting, unspecified: R11.10

## 2013-01-18 LAB — CBC
HCT: 45.4 % (ref 36.0–46.0)
Hemoglobin: 15.9 g/dL — ABNORMAL HIGH (ref 12.0–15.0)
MCH: 32 pg (ref 26.0–34.0)
MCHC: 35 g/dL (ref 30.0–36.0)
MCV: 91.3 fL (ref 78.0–100.0)
Platelets: 193 10*3/uL (ref 150–400)
RBC: 4.97 MIL/uL (ref 3.87–5.11)
RDW: 13.7 % (ref 11.5–15.5)
WBC: 8.7 10*3/uL (ref 4.0–10.5)

## 2013-01-18 LAB — BASIC METABOLIC PANEL
BUN: 11 mg/dL (ref 6–23)
CO2: 22 mEq/L (ref 19–32)
Calcium: 9.6 mg/dL (ref 8.4–10.5)
Chloride: 105 mEq/L (ref 96–112)
Creatinine, Ser: 0.63 mg/dL (ref 0.50–1.10)
GFR calc Af Amer: >90 mL/min (ref >90)
GFR calc non Af Amer: >90 mL/min (ref >90)
Glucose, Bld: 322 mg/dL — ABNORMAL HIGH (ref 70–99)
Potassium: 4.8 mEq/L (ref 3.5–5.1)
Sodium: 140 mEq/L (ref 135–145)

## 2013-01-18 LAB — SURGICAL PCR SCREEN
MRSA, PCR: NEGATIVE
Staphylococcus aureus: NEGATIVE

## 2013-01-18 NOTE — Pre-Procedure Instructions (Signed)
Abigail Collins  01/18/2013   Your procedure is scheduled on:  01/20/13  Report to [facility] at 2 pm  Call this number if you have problems the morning of surgery: [phone]   Remember:   Do not eat food or drink liquids after midnight.   Take these medicines the morning of surgery with A SIP OF WATER: all inhalers,vicodan   Do not wear jewelry, make-up or nail polish.  Do not wear lotions, powders, or perfumes. You may wear deodorant.  Do not shave 48 hours prior to surgery. Men may shave face and neck.  Do not bring valuables to the hospital.  Contacts, dentures or bridgework may not be worn into surgery.  Leave suitcase in the car. After surgery it may be brought to your room.  For patients admitted to the hospital, checkout time is 11:00 AM the day of  discharge.   Patients discharged the day of surgery will not be allowed to drive  home.  Name and phone number of your driver: family  Special Instructions: Incentive Spirometry - Practice and bring it with you on the day of surgery.   Please read over the following fact sheets that you were given: Pain Booklet, Coughing and Deep Breathing, MRSA Information and Surgical Site Infection Prevention

## 2013-01-18 NOTE — Progress Notes (Signed)
Cath report in epic. Cleared per Dr Anne Fu

## 2013-01-19 MED ORDER — CEFAZOLIN SODIUM-DEXTROSE 2-3 GM-% IV SOLR: 2.0000 g | INTRAVENOUS | Status: DC

## 2013-01-20 ENCOUNTER — Ambulatory Visit (HOSPITAL_COMMUNITY): Admission: RE | Admit: 2013-01-20 | Payer: BC Managed Care – PPO | Source: Ambulatory Visit | Admitting: Neurosurgery

## 2013-01-20 ENCOUNTER — Encounter (HOSPITAL_COMMUNITY): Payer: Self-pay | Admitting: Anesthesiology

## 2013-01-20 ENCOUNTER — Encounter (HOSPITAL_COMMUNITY): Admission: RE | Payer: Self-pay | Source: Ambulatory Visit

## 2013-01-20 SURGERY — KYPHOPLASTY
Anesthesia: General

## 2013-02-01 ENCOUNTER — Other Ambulatory Visit: Payer: Self-pay | Admitting: Neurosurgery

## 2013-02-02 ENCOUNTER — Encounter (HOSPITAL_COMMUNITY): Payer: Self-pay

## 2013-02-07 ENCOUNTER — Encounter (HOSPITAL_COMMUNITY): Payer: Self-pay

## 2013-02-07 ENCOUNTER — Encounter (HOSPITAL_COMMUNITY)
Admission: RE | Admit: 2013-02-07 | Discharge: 2013-02-07 | Disposition: A | Discharge status: Home / Self Care | Payer: BC Managed Care – PPO | Source: Ambulatory Visit | Attending: Neurosurgery | Admitting: Neurosurgery

## 2013-02-07 LAB — CBC
HCT: 43.2 % (ref 36.0–46.0)
Hemoglobin: 14.7 g/dL (ref 12.0–15.0)
MCH: 31.3 pg (ref 26.0–34.0)
MCHC: 34 g/dL (ref 30.0–36.0)
MCV: 91.9 fL (ref 78.0–100.0)
Platelets: 197 10*3/uL (ref 150–400)
RBC: 4.7 MIL/uL (ref 3.87–5.11)
RDW: 13.4 % (ref 11.5–15.5)
WBC: 8.2 10*3/uL (ref 4.0–10.5)

## 2013-02-07 LAB — BASIC METABOLIC PANEL
BUN: 15 mg/dL (ref 6–23)
CO2: 21 mEq/L (ref 19–32)
Calcium: 9.2 mg/dL (ref 8.4–10.5)
Chloride: 104 mEq/L (ref 96–112)
Creatinine, Ser: 0.59 mg/dL (ref 0.50–1.10)
GFR calc Af Amer: >90 mL/min (ref >90)
GFR calc non Af Amer: >90 mL/min (ref >90)
Glucose, Bld: 369 mg/dL — ABNORMAL HIGH (ref 70–99)
Potassium: 4.5 mEq/L (ref 3.5–5.1)
Sodium: 137 mEq/L (ref 135–145)

## 2013-02-07 NOTE — Pre-Procedure Instructions (Addendum)
Abigail Collins  02/07/2013   Your procedure is scheduled on: 02/10/13  Report to [facility] at530 AM.  Call this number if you have problems the morning of surgery: [phone]   Remember:   Do not eat food or drink liquids after midnight.   Take these medicines the morning of surgery with A SIP OF WATER: pain med, inhalers,    Do not wear jewelry, make-up or nail polish.  Do not wear lotions, powders, or perfumes. You may wear deodorant.  Do not shave 48 hours prior to surgery. Men may shave face and neck.  Do not bring valuables to the hospital.  Contacts, dentures or bridgework may not be worn into surgery.  Leave suitcase in the car. After surgery it may be brought to your room.  For patients admitted to the hospital, checkout time is 11:00 AM the day of  discharge.   Patients discharged the day of surgery will not be allowed to drive  home.  Name and phone number of your driver:  Special Instructions: Shower using CHG 2 nights before surgery and the night before surgery.  If you shower the day of surgery use CHG.  Use special wash - you have one bottle of CHG for all showers.  You should use approximately 1/3 of the bottle for each shower.   Please read over the following fact sheets that you were given: Pain Booklet, Coughing and Deep Breathing, MRSA Information and Surgical Site Infection Prevention

## 2013-02-07 NOTE — Progress Notes (Signed)
Echo 6/13, cath 8/13 cl per dr Hattie Perch reviewed chart

## 2013-02-08 NOTE — Progress Notes (Signed)
Anesthesia chart review: Patient is a 62 year old female scheduled for L2 kyphoplasty by Dr. Lovell Sheehan on 02/10/13.  History includes obesity, former smoker, postoperative nausea and vomiting, diabetes mellitus type 2, hyperlipidemia, hypertension, anemia, thyroid disease (not specified), headache, arthritis. She was evaluated by cardiologist Dr. Anne Fu for chest pain in 2013 and had a mildly abnormal stress test followed by a cardiac cath in August 2013 that showed no significant CAD and PRN cardiology follow-up was recommended (records scanned under Media tab, Correspondence from Encounter 09/13/12). She is s/p right TKA on 09/13/12.  PCP is Dr. [name].   Cardiac cath on 05/13/2012 showed no angiographically significant coronary artery disease. Moderate luminal irregularity of the proximal LAD segment but no flow limitation. Normal left ventricular systolic function. LVEDP 10 mmHg. Ejection fraction 65%. (This was done following an abnormal stress test on 04/08/12 that showed EF 62%, a mild degree of ischemia cannot be excluded in the basal inferolateral region as well as apex/anteroseptal.)   Echo on 04/06/2012 showed:  Left ventricle: The cavity size was normal. There was mild concentric hypertrophy. Systolic function was normal. The estimated ejection fraction was in the range of 55% to 60%. Wall motion was normal; there were no regional wall motion abnormalities. Doppler parameters are consistent with abnormal left ventricular relaxation (grade 1 diastolic dysfunction).   EKG on 02/07/13 showed NSR, low voltage QRS, cannot rule out anterior infarct (age undetermined).  I does not look significantly changed when compared to her EKG form 06/10/11.   Chest x-ray on 08/05/2012 showed no active disease. Stable chronic central mild bronchitic changes.   Preoperative labs noted.  Non-fasting glucose is elevated at 369.  Her last A1C was 8.0 on 09/13/12.  I called and spoke with Ms. Ferraris.  She has not be  very compliant with checking her home glucose readings, so she could not give me her usual fasting glucose reading.  She has been somewhat liberal with her diet because they recently were able to buy more food following food stamp approval.  She will be more compliant with her diet over the next few days.  I also encouraged her to check her fasting glucose and contact PCP Dr. Yetta Barre got input/follow-up if > 250 --she is due for an A1C.  I also left a voice message with Darl Pikes at Dr. York Ram office regarding patient's hyperglycemia.  Patient will get a fasting CBG on the day of surgery.  Definite plan pending her glucose results.  Would anticipate that if it is not significantly elevated then she could proceed.  Velna Ochs [facility]Anesthesiology Phone [phone] 02/08/2013 10:36 AM

## 2013-02-09 MED ORDER — CEFAZOLIN SODIUM-DEXTROSE 2-3 GM-% IV SOLR
2.0000 g | INTRAVENOUS | Status: AC
  Administered 2013-02-10: 2 g via INTRAVENOUS
  Filled 2013-02-09: qty 50

## 2013-02-10 ENCOUNTER — Encounter (HOSPITAL_COMMUNITY): Payer: Self-pay | Admitting: *Deleted

## 2013-02-10 ENCOUNTER — Ambulatory Visit (HOSPITAL_COMMUNITY)
Admission: RE | Admit: 2013-02-10 | Discharge: 2013-02-11 | Disposition: A | Discharge status: Home / Self Care | Payer: BC Managed Care – PPO | Source: Ambulatory Visit | Attending: Neurosurgery | Admitting: Neurosurgery

## 2013-02-10 ENCOUNTER — Encounter (HOSPITAL_COMMUNITY)
Admission: RE | Disposition: A | Discharge status: Home / Self Care | Payer: Self-pay | Source: Ambulatory Visit | Attending: Neurosurgery

## 2013-02-10 ENCOUNTER — Encounter (HOSPITAL_COMMUNITY): Payer: Self-pay | Admitting: Vascular Surgery

## 2013-02-10 ENCOUNTER — Ambulatory Visit (HOSPITAL_COMMUNITY): Payer: BC Managed Care – PPO | Admitting: Certified Registered"

## 2013-02-10 ENCOUNTER — Ambulatory Visit (HOSPITAL_COMMUNITY): Payer: BC Managed Care – PPO

## 2013-02-10 DIAGNOSIS — I10 Essential (primary) hypertension: Secondary | ICD-10-CM | POA: Insufficient documentation

## 2013-02-10 DIAGNOSIS — Z01812 Encounter for preprocedural laboratory examination: Secondary | ICD-10-CM | POA: Insufficient documentation

## 2013-02-10 DIAGNOSIS — M8448XA Pathological fracture, other site, initial encounter for fracture: Secondary | ICD-10-CM | POA: Insufficient documentation

## 2013-02-10 DIAGNOSIS — E119 Type 2 diabetes mellitus without complications: Secondary | ICD-10-CM | POA: Insufficient documentation

## 2013-02-10 DIAGNOSIS — Z01818 Encounter for other preprocedural examination: Secondary | ICD-10-CM | POA: Insufficient documentation

## 2013-02-10 DIAGNOSIS — S32000G Wedge compression fracture of unspecified lumbar vertebra, subsequent encounter for fracture with delayed healing: Secondary | ICD-10-CM

## 2013-02-10 HISTORY — PX: KYPHOPLASTY: SHX5884

## 2013-02-10 LAB — GLUCOSE, CAPILLARY
Glucose-Capillary: 233 mg/dL — ABNORMAL HIGH (ref 70–99)
Glucose-Capillary: 212 mg/dL — ABNORMAL HIGH (ref 70–99)
Glucose-Capillary: 247 mg/dL — ABNORMAL HIGH (ref 70–99)
Glucose-Capillary: 246 mg/dL — ABNORMAL HIGH (ref 70–99)
Glucose-Capillary: 221 mg/dL — ABNORMAL HIGH (ref 70–99)

## 2013-02-10 SURGERY — KYPHOPLASTY
Anesthesia: General | Wound class: Clean

## 2013-02-10 MED ORDER — PHENOL 1.4 % MT LIQD
1.0000 | OROMUCOSAL | Status: DC | PRN
  Filled 2013-02-10: qty 177

## 2013-02-10 MED ORDER — SCOPOLAMINE 1 MG/3DAYS TD PT72
MEDICATED_PATCH | TRANSDERMAL | Status: AC
  Administered 2013-02-10: 1 via TRANSDERMAL
  Filled 2013-02-10: qty 1

## 2013-02-10 MED ORDER — ACETAMINOPHEN 10 MG/ML IV SOLN
1000.0000 mg | Freq: Once | INTRAVENOUS | Status: DC
  Filled 2013-02-10: qty 100

## 2013-02-10 MED ORDER — METFORMIN HCL 500 MG PO TABS
1000.0000 mg | ORAL_TABLET | Freq: Two times a day (BID) | ORAL | Status: DC
  Administered 2013-02-10 – 2013-02-11 (×2): 1000 mg via ORAL
  Filled 2013-02-10 (×4): qty 2

## 2013-02-10 MED ORDER — OXYCODONE HCL 5 MG/5ML PO SOLN: 5.0000 mg | Freq: Once | ORAL | Status: DC | PRN

## 2013-02-10 MED ORDER — NEOSTIGMINE METHYLSULFATE 1 MG/ML IJ SOLN
INTRAMUSCULAR | Status: DC | PRN
  Administered 2013-02-10: 4 mg via INTRAVENOUS

## 2013-02-10 MED ORDER — MORPHINE SULFATE 2 MG/ML IJ SOLN: 1.0000 mg | INTRAMUSCULAR | Status: DC | PRN

## 2013-02-10 MED ORDER — HYDROMORPHONE HCL PF 1 MG/ML IJ SOLN
0.2500 mg | INTRAMUSCULAR | Status: DC | PRN
  Administered 2013-02-10 (×3): 0.5 mg via INTRAVENOUS

## 2013-02-10 MED ORDER — HYDROMORPHONE HCL 2 MG PO TABS
2.0000 mg | ORAL_TABLET | ORAL | Status: DC | PRN
  Administered 2013-02-10 (×2): 2 mg via ORAL
  Filled 2013-02-10 (×2): qty 1

## 2013-02-10 MED ORDER — PROMETHAZINE HCL 25 MG/ML IJ SOLN: 6.2500 mg | INTRAMUSCULAR | Status: DC | PRN

## 2013-02-10 MED ORDER — FENTANYL CITRATE 0.05 MG/ML IJ SOLN
INTRAMUSCULAR | Status: DC | PRN
  Administered 2013-02-10 (×2): 50 ug via INTRAVENOUS
  Administered 2013-02-10: 100 ug via INTRAVENOUS

## 2013-02-10 MED ORDER — PHENYLEPHRINE HCL 10 MG/ML IJ SOLN
INTRAMUSCULAR | Status: DC | PRN
  Administered 2013-02-10: 40 ug via INTRAVENOUS
  Administered 2013-02-10: 80 ug via INTRAVENOUS

## 2013-02-10 MED ORDER — LACTATED RINGERS IV SOLN: INTRAVENOUS | Status: DC

## 2013-02-10 MED ORDER — INSULIN ASPART 100 UNIT/ML ~~LOC~~ SOLN
0.0000 [IU] | Freq: Three times a day (TID) | SUBCUTANEOUS | Status: DC
  Administered 2013-02-11: 7 [IU] via SUBCUTANEOUS

## 2013-02-10 MED ORDER — 0.9 % SODIUM CHLORIDE (POUR BTL) OPTIME
TOPICAL | Status: DC | PRN
  Administered 2013-02-10: 1000 mL

## 2013-02-10 MED ORDER — GLYCOPYRROLATE 0.2 MG/ML IJ SOLN
INTRAMUSCULAR | Status: DC | PRN
  Administered 2013-02-10: .8 mg via INTRAVENOUS

## 2013-02-10 MED ORDER — HYDROMORPHONE HCL PF 1 MG/ML IJ SOLN
INTRAMUSCULAR | Status: AC
  Filled 2013-02-10: qty 2

## 2013-02-10 MED ORDER — ROCURONIUM BROMIDE 100 MG/10ML IV SOLN
INTRAVENOUS | Status: DC | PRN
  Administered 2013-02-10: 10 mg via INTRAVENOUS
  Administered 2013-02-10: 50 mg via INTRAVENOUS

## 2013-02-10 MED ORDER — MIDAZOLAM HCL 5 MG/5ML IJ SOLN
INTRAMUSCULAR | Status: DC | PRN
  Administered 2013-02-10: 1 mg via INTRAVENOUS

## 2013-02-10 MED ORDER — ACETAMINOPHEN 650 MG RE SUPP: 650.0000 mg | RECTAL | Status: DC | PRN

## 2013-02-10 MED ORDER — DOCUSATE SODIUM 100 MG PO CAPS
100.0000 mg | ORAL_CAPSULE | Freq: Two times a day (BID) | ORAL | Status: DC
  Administered 2013-02-10: 100 mg via ORAL
  Filled 2013-02-10: qty 1

## 2013-02-10 MED ORDER — BUPIVACAINE-EPINEPHRINE 0.5% -1:200000 IJ SOLN
INTRAMUSCULAR | Status: DC | PRN
  Administered 2013-02-10: 15 mL

## 2013-02-10 MED ORDER — ALUM & MAG HYDROXIDE-SIMETH 200-200-20 MG/5ML PO SUSP: 30.0000 mL | Freq: Four times a day (QID) | ORAL | Status: DC | PRN

## 2013-02-10 MED ORDER — MIDAZOLAM HCL 2 MG/2ML IJ SOLN: 0.5000 mg | Freq: Once | INTRAMUSCULAR | Status: DC | PRN

## 2013-02-10 MED ORDER — MEPERIDINE HCL 25 MG/ML IJ SOLN: 6.2500 mg | INTRAMUSCULAR | Status: DC | PRN

## 2013-02-10 MED ORDER — BACITRACIN 50000 UNITS IM SOLR
INTRAMUSCULAR | Status: AC
  Filled 2013-02-10: qty 1

## 2013-02-10 MED ORDER — CEFAZOLIN SODIUM-DEXTROSE 2-3 GM-% IV SOLR
2.0000 g | Freq: Three times a day (TID) | INTRAVENOUS | Status: AC
  Administered 2013-02-10 (×2): 2 g via INTRAVENOUS
  Filled 2013-02-10 (×2): qty 50

## 2013-02-10 MED ORDER — MENTHOL 3 MG MT LOZG: 1.0000 | LOZENGE | OROMUCOSAL | Status: DC | PRN

## 2013-02-10 MED ORDER — OXYCODONE HCL 5 MG PO TABS: 5.0000 mg | ORAL_TABLET | Freq: Once | ORAL | Status: DC | PRN

## 2013-02-10 MED ORDER — ONDANSETRON HCL 4 MG/2ML IJ SOLN: 4.0000 mg | INTRAMUSCULAR | Status: DC | PRN

## 2013-02-10 MED ORDER — ARTIFICIAL TEARS OP OINT
TOPICAL_OINTMENT | OPHTHALMIC | Status: DC | PRN
  Administered 2013-02-10: 1 via OPHTHALMIC

## 2013-02-10 MED ORDER — ALBUTEROL SULFATE HFA 108 (90 BASE) MCG/ACT IN AERS: 2.0000 | INHALATION_SPRAY | Freq: Four times a day (QID) | RESPIRATORY_TRACT | Status: DC | PRN

## 2013-02-10 MED ORDER — IOHEXOL 300 MG/ML  SOLN
INTRAMUSCULAR | Status: DC | PRN
  Administered 2013-02-10: 50 mL via INTRAVENOUS

## 2013-02-10 MED ORDER — ONDANSETRON HCL 4 MG/2ML IJ SOLN
INTRAMUSCULAR | Status: DC | PRN
  Administered 2013-02-10: 4 mg via INTRAVENOUS

## 2013-02-10 MED ORDER — ACETAMINOPHEN 10 MG/ML IV SOLN
INTRAVENOUS | Status: AC
  Administered 2013-02-10: 1000 mg via INTRAVENOUS
  Filled 2013-02-10: qty 100

## 2013-02-10 MED ORDER — LIDOCAINE HCL (CARDIAC) 20 MG/ML IV SOLN
INTRAVENOUS | Status: DC | PRN
  Administered 2013-02-10: 20 mg via INTRAVENOUS

## 2013-02-10 MED ORDER — SODIUM CHLORIDE 0.9 % IV SOLN
INTRAVENOUS | Status: AC
  Filled 2013-02-10: qty 500

## 2013-02-10 MED ORDER — DIAZEPAM 5 MG PO TABS
5.0000 mg | ORAL_TABLET | Freq: Four times a day (QID) | ORAL | Status: DC | PRN
  Administered 2013-02-10: 5 mg via ORAL
  Filled 2013-02-10: qty 1

## 2013-02-10 MED ORDER — ACETAMINOPHEN 325 MG PO TABS
650.0000 mg | ORAL_TABLET | ORAL | Status: DC | PRN
  Administered 2013-02-10: 650 mg via ORAL
  Filled 2013-02-10: qty 2

## 2013-02-10 MED ORDER — LACTATED RINGERS IV SOLN
INTRAVENOUS | Status: DC | PRN
  Administered 2013-02-10 (×2): via INTRAVENOUS

## 2013-02-10 MED ORDER — PROPOFOL 10 MG/ML IV BOLUS
INTRAVENOUS | Status: DC | PRN
  Administered 2013-02-10: 100 mg via INTRAVENOUS

## 2013-02-10 MED ORDER — MOMETASONE FURO-FORMOTEROL FUM 100-5 MCG/ACT IN AERO
2.0000 | INHALATION_SPRAY | Freq: Two times a day (BID) | RESPIRATORY_TRACT | Status: DC
  Filled 2013-02-10: qty 8.8

## 2013-02-10 MED ORDER — INSULIN ASPART 100 UNIT/ML ~~LOC~~ SOLN
0.0000 [IU] | SUBCUTANEOUS | Status: DC
  Administered 2013-02-10 (×2): 7 [IU] via SUBCUTANEOUS

## 2013-02-10 SURGICAL SUPPLY — 47 items
ADH SKN CLS LQ APL DERMABOND (GAUZE/BANDAGES/DRESSINGS) ×1
APL SKNCLS STERI-STRIP NONHPOA (GAUZE/BANDAGES/DRESSINGS) ×1
BENZOIN TINCTURE PRP APPL 2/3 (GAUZE/BANDAGES/DRESSINGS) ×2 IMPLANT
BLADE SURG 15 STRL LF DISP TIS (BLADE) ×1 IMPLANT
BLADE SURG 15 STRL SS (BLADE) ×2
BLADE SURG ROTATE 9660 (MISCELLANEOUS) IMPLANT
CEMENT BONE KYPHX HV R (Orthopedic Implant) ×1 IMPLANT
CEMENT KYPHON C01A KIT/MIXER (Cement) ×1 IMPLANT
CLOTH BEACON ORANGE TIMEOUT ST (SAFETY) ×2 IMPLANT
CONT SPEC 4OZ CLIKSEAL STRL BL (MISCELLANEOUS) ×3 IMPLANT
DERMABOND ADHESIVE PROPEN (GAUZE/BANDAGES/DRESSINGS) ×1
DERMABOND ADVANCED .7 DNX6 (GAUZE/BANDAGES/DRESSINGS) IMPLANT
DEVICE BIOPSY BONE KYPHX (INSTRUMENTS) ×1 IMPLANT
DRAPE C-ARM 42X72 X-RAY (DRAPES) ×2 IMPLANT
DRAPE INCISE IOBAN 66X45 STRL (DRAPES) ×2 IMPLANT
DRAPE LAPAROTOMY 100X72X124 (DRAPES) ×2 IMPLANT
DRAPE PROXIMA HALF (DRAPES) ×2 IMPLANT
DRAPE SURG 17X23 STRL (DRAPES) ×8 IMPLANT
DRESSING TELFA 8X3 (GAUZE/BANDAGES/DRESSINGS) ×1 IMPLANT
GAUZE SPONGE 4X4 16PLY XRAY LF (GAUZE/BANDAGES/DRESSINGS) ×2 IMPLANT
GLOVE BIO SURGEON STRL SZ8.5 (GLOVE) ×2 IMPLANT
GLOVE BIOGEL PI IND STRL 7.0 (GLOVE) IMPLANT
GLOVE BIOGEL PI INDICATOR 7.0 (GLOVE) ×1
GLOVE EXAM NITRILE LRG STRL (GLOVE) ×1 IMPLANT
GLOVE EXAM NITRILE MD LF STRL (GLOVE) IMPLANT
GLOVE EXAM NITRILE XL STR (GLOVE) IMPLANT
GLOVE EXAM NITRILE XS STR PU (GLOVE) IMPLANT
GLOVE SS BIOGEL STRL SZ 6.5 (GLOVE) IMPLANT
GLOVE SS BIOGEL STRL SZ 8 (GLOVE) ×1 IMPLANT
GLOVE SUPERSENSE BIOGEL SZ 6.5 (GLOVE) ×2
GLOVE SUPERSENSE BIOGEL SZ 8 (GLOVE) ×1
GOWN BRE IMP SLV AUR LG STRL (GOWN DISPOSABLE) IMPLANT
GOWN BRE IMP SLV AUR XL STRL (GOWN DISPOSABLE) IMPLANT
KIT BASIN OR (CUSTOM PROCEDURE TRAY) ×2 IMPLANT
KIT ROOM TURNOVER OR (KITS) ×2 IMPLANT
NEEDLE HYPO 22GX1.5 SAFETY (NEEDLE) ×2 IMPLANT
NS IRRIG 1000ML POUR BTL (IV SOLUTION) ×2 IMPLANT
PACK EENT II TURBAN DRAPE (CUSTOM PROCEDURE TRAY) ×2 IMPLANT
PAD ARMBOARD 7.5X6 YLW CONV (MISCELLANEOUS) ×6 IMPLANT
SPECIMEN JAR SMALL (MISCELLANEOUS) IMPLANT
SPONGE GAUZE 4X4 12PLY (GAUZE/BANDAGES/DRESSINGS) ×2 IMPLANT
STRIP CLOSURE SKIN 1/2X4 (GAUZE/BANDAGES/DRESSINGS) ×2 IMPLANT
SUT VIC AB 3-0 SH 8-18 (SUTURE) ×2 IMPLANT
SYR CONTROL 10ML LL (SYRINGE) ×2 IMPLANT
TOWEL OR 17X24 6PK STRL BLUE (TOWEL DISPOSABLE) ×2 IMPLANT
TOWEL OR 17X26 10 PK STRL BLUE (TOWEL DISPOSABLE) ×2 IMPLANT
TRAY KYPHOPAK 20/3 ONESTEP 1ST (MISCELLANEOUS) ×1 IMPLANT

## 2013-02-10 NOTE — Transfer of Care (Signed)
Immediate Anesthesia Transfer of Care Note  Patient: Abigail Collins  Procedure(s) Performed: Procedure(s) with comments:  LUMBAR TWO KYPHOPLASTY (N/A) - L2 Kyphoplasty; Will use Stern's Carm.  Patient Location: PACU  Anesthesia Type:General  Level of Consciousness: awake, alert  and oriented  Airway & Oxygen Therapy: Patient Spontanous Breathing and Patient connected to face mask oxygen  Post-op Assessment: Report given to PACU RN, Post -op Vital signs reviewed and stable and Patient moving all extremities X 4  Post vital signs: Reviewed and stable  Complications: No apparent anesthesia complications

## 2013-02-10 NOTE — Anesthesia Preprocedure Evaluation (Addendum)
Anesthesia Evaluation  Patient identified by MRN, date of birth, ID band Patient awake    Reviewed: Allergy & Precautions, H&P , NPO status , Patient's Chart, lab work & pertinent test results  History of Anesthesia Complications (+) PONV  Airway Mallampati: II TM Distance: >3 FB Neck ROM: Full    Dental  (+) Edentulous Upper, Dental Advisory Given and Chipped   Pulmonary shortness of breath, asthma (no inhaler needed in 6 months) , pneumonia -, resolved, former smoker (quit 3 years),  breath sounds clear to auscultation  Pulmonary exam normal       Cardiovascular Rhythm:Regular Rate:Normal  '13 cath: no sig coronary disease '13 ECHO: normal systolic function, normal valves   Neuro/Psych PSYCHIATRIC DISORDERS Depression  Neuromuscular disease    GI/Hepatic Neg liver ROS, GERD-  Controlled and Medicated,  Endo/Other  diabetes (glu 233), Well Controlled, Type 2, Oral Hypoglycemic AgentsMorbid obesity  Renal/GU negative Renal ROS     Musculoskeletal   Abdominal (+) + obese,   Peds  Hematology   Anesthesia Other Findings   Reproductive/Obstetrics                          Anesthesia Physical Anesthesia Plan  ASA: III  Anesthesia Plan: General   Post-op Pain Management:    Induction: Intravenous  Airway Management Planned: Oral ETT  Additional Equipment:   Intra-op Plan:   Post-operative Plan: Extubation in OR  Informed Consent: I have reviewed the patients History and Physical, chart, labs and discussed the procedure including the risks, benefits and alternatives for the proposed anesthesia with the patient or authorized representative who has indicated his/her understanding and acceptance.   Dental advisory given  Plan Discussed with: Surgeon and CRNA  Anesthesia Plan Comments: (Plan routine monitors, GETA)        Anesthesia Quick Evaluation

## 2013-02-10 NOTE — H&P (Signed)
Subjective: The patient is a 62 year old white female who complains of back pain. She has failed medical management and was worked up with a lumbar MRI. This demonstrated a L2 compression fracture. I discussed the various treatment options with the patient including surgery. She has weighed the risks, benefits, and alternatives surgery and decided proceed with an L2 kyphoplasty.   Past Medical History  Diagnosis Date  . Thyroid disease 1960's    "don't have it now" (09/14/2012)  . Bulging disc     "lower"  . PONV (postoperative nausea and vomiting)   . Anginal pain     "left arm pain, sees Dr. Dione Housekeeper, had card cath 2013"  . Hyperlipidemia   . Hypertension     sees Dr. Nolon Bussing, primary  . Asthma     takes inhaler 2x day  . Bronchitis     hx of  . Urinary tract infection     hx of  . Anemia     hx of  . Exertional dyspnea     "sometimes laying down" (09/14/2012)  . Type II diabetes mellitus   . Carpal tunnel syndrome of left wrist   . Arthritis     "all over" (09/14/2012)  . Pneumonia 03/2012  . Vomiting     pt states she vomits every am  . Depression     denies    Past Surgical History  Procedure Laterality Date  . Cardiac catheterization  05/13/2012    mod luminal irregularity of pLAD, no sign CAD, EF 65%.  . Total knee arthroplasty  09/13/2012    Procedure: TOTAL KNEE ARTHROPLASTY;  Surgeon: Dannielle Huh, MD;  Location: [facility] OR;  Service: Orthopedics;  Laterality: Right;  . Vaginal hysterectomy  1970's  . Cholecystectomy  1970's  . Tubal ligation  1970's  . Total elbow replacement  ~ 2005    "right" (09/14/2012)    Allergies  Allergen Reactions  . Lisinopril Shortness Of Breath  . Tape Other (See Comments)    Tore skin--?hypofix  . Percocet (Oxycodone-Acetaminophen) Nausea And Vomiting    History  Substance Use Topics  . Smoking status: Former Smoker -- 0.25 packs/day for 20 years    Types: Cigarettes    Quit date: 10/13/2009  . Smokeless tobacco: Never Used   . Alcohol Use: No     Comment: 09/14/2012 "did drink a little in my younger days"    Family History  Problem Relation Age of Onset  . Arthritis Mother   . Arthritis Father   . Hypertension Father   . Diabetes Father    Prior to Admission medications   Medication Sig Start Date End Date Taking? Authorizing Provider  albuterol (PROVENTIL HFA;VENTOLIN HFA) 108 (90 BASE) MCG/ACT inhaler Inhale 2 puffs into the lungs every 6 (six) hours as needed. For shortness of breath.   Yes Historical Provider, MD  Calcium Carbonate Antacid (ALKA-SELTZER ANTACID PO) Take 1 tablet by mouth daily as needed (upset stomach).   Yes Historical Provider, MD  HYDROcodone-acetaminophen (NORCO/VICODIN) 5-325 MG per tablet Take 1 tablet by mouth every 8 (eight) hours as needed for pain. 01/11/13  Yes Erick Colace, MD  ketoconazole (NIZORAL) 2 % cream Apply 1 application topically 2 (two) times daily as needed (yeast infection).   Yes Historical Provider, MD  Liraglutide (VICTOZA) 18 MG/3ML SOLN Inject 0.3 mLs (1.8 mg total) into the skin daily. 08/05/12  Yes [name], MD  metFORMIN (GLUCOPHAGE) 1000 MG tablet Take 1 tablet (1,000 mg total) by  mouth 2 (two) times daily with a meal. 10/07/12  Yes Nicki Reaper, NP  mometasone-formoterol (DULERA) 100-5 MCG/ACT AERO Inhale 2 puffs into the lungs 2 (two) times daily. 08/20/12  Yes [name], MD  naproxen sodium (ANAPROX) 220 MG tablet Take 220 mg by mouth 2 (two) times daily with a meal.    Historical Provider, MD     Review of Systems  Positive ROS: As above  All other systems have been reviewed and were otherwise negative with the exception of those mentioned in the HPI and as above.  Objective: Vital signs in last 24 hours: Temp:  [98 F (36.7 C)] 98 F (36.7 C) (05/01 0615) Pulse Rate:  [99] 99 (05/01 0615) Resp:  [20] 20 (05/01 0615) BP: (141)/(92) 141/92 mmHg (05/01 0615) SpO2:  [93 %] 93 % (05/01 0615)  General Appearance: Alert,  cooperative, no distress, appears stated age Head: Normocephalic, without obvious abnormality, atraumatic Eyes: PERRL, conjunctiva/corneas clear, EOM's intact, fundi benign, both eyes      Ears: Normal TM's and external ear canals, both ears Throat: Lips, mucosa, and tongue normal; teeth and gums normal Neck: Supple, symmetrical, trachea midline, no adenopathy; thyroid: No enlargement/tenderness/nodules; no carotid bruit or JVD Back: Symmetric, no curvature, ROM normal, no CVA tenderness Lungs: Clear to auscultation bilaterally, respirations unlabored Heart: Regular rate and rhythm, S1 and S2 normal, no murmur, rub or gallop Abdomen: Soft, non-tender, bowel sounds active all four quadrants, no masses, no organomegaly Extremities: Extremities normal, atraumatic, no cyanosis or edema Pulses: 2+ and symmetric all extremities Skin: Skin color, texture, turgor normal, no rashes or lesions  NEUROLOGIC:   Mental status: alert and oriented, no aphasia, good attention span, Fund of knowledge/ memory ok Motor Exam - grossly normal Sensory Exam - grossly normal Reflexes:  Coordination - grossly normal Gait - grossly normal Balance - grossly normal Cranial Nerves: I: smell Not tested  II: visual acuity  OS: Normal    OD: Normal   II: visual fields Full to confrontation  II: pupils Equal, round, reactive to light  III,VII: ptosis None  III,IV,VI: extraocular muscles  Full ROM  V: mastication Normal  V: facial light touch sensation  Normal  V,VII: corneal reflex  Present  VII: facial muscle function - upper  Normal  VII: facial muscle function - lower Normal  VIII: hearing Not tested  IX: soft palate elevation  Normal  IX,X: gag reflex Present  XI: trapezius strength  5/5  XI: sternocleidomastoid strength 5/5  XI: neck flexion strength  5/5  XII: tongue strength  Normal    Data Review Lab Results  Component Value Date   WBC 8.2 02/07/2013   HGB 14.7 02/07/2013   HCT 43.2 02/07/2013    MCV 91.9 02/07/2013   PLT 197 02/07/2013   Lab Results  Component Value Date   NA 137 02/07/2013   K 4.5 02/07/2013   CL 104 02/07/2013   CO2 21 02/07/2013   BUN 15 02/07/2013   CREATININE 0.59 02/07/2013   GLUCOSE 369* 02/07/2013   Lab Results  Component Value Date   INR 0.88 09/03/2012    Assessment/Plan: L2 compression fracture, lumbago: I have discussed situation with the patient. I reviewed her MR scan with her and pointed out the abnormalities. We have discussed the various treatment options including a L2 kyphoplasty. I described that surgery to her. I've shown her surgical models. We have discussed the risks, benefits, alternatives, and likelihood of achieving our goals with surgery. I've answered  all the patient's questions. She wants to proceed with surgery.   Roniqua Kintz D 02/10/2013 7:21 AM

## 2013-02-10 NOTE — Op Note (Signed)
Brief history: The patient is a 62 year old white female complains of back pain. She has failed medical management and was worked up with a lumbar MRI. This demonstrated an acute L2 compression fracture. I discussed the various treatment option with the patient including surgery. She has weighed the risks, benefits, and alternatives to surgery and decided proceed with a L2 kyphoplasty.  Preoperative diagnosis: L2 compression fracture, lumbago  Postop diagnosis: The same  Procedure: L2 kyphoplasty  Surgeon: Dr. Delma Officer  Assistant: None  Anesthesia: Gen. endotracheal  Estimated blood loss: Minimal  Specimens: L2 vertebral body biopsy  Complications: None  Description of procedure: The patient was brought to the operating room by the anesthesia team. General endotracheal anesthesia was induced. The patient was turned to the prone position on the chest rolls. Her thoracolumbar region was then prepared with Betadine scrub and Betadine solution. Sterile drapes were applied. I injected the area to be incised with Marcaine with epinephrine solution. I then made small incisions over the L2 pedicle bilaterally. Under biplanar fluoroscopy I cannulated the bilateral L2 pedicles with the Jamshidi needle. I then obtained a left L2 vertebral body biopsy. I then drilled through the Jamshidi needle, then inserted the balloon, inflated the balloon, then deflated the balloon, I then removed the balloon. Under fluoroscopic guidance we injected bone cement into the L2 vertebral bodies. After were satisfied with the injection we removed the Jamshidi needles. I then reapproximated patient's subcutaneous tissue with interrupted 3 over suture. I then placed Dermabond on the skin. The drapes were removed. The patient was subsequently returned to the supine position where she was extubated by the anesthesia team. She was then transported to the post anesthesia care unit in stable condition. By report all sponge  instrument and needle counts were correct at the end this case.

## 2013-02-10 NOTE — Anesthesia Postprocedure Evaluation (Signed)
  Anesthesia Post-op Note  Patient: Abigail Collins  Procedure(s) Performed: Procedure(s) with comments:  LUMBAR TWO KYPHOPLASTY (N/A) - L2 Kyphoplasty; Will use Stern's Carm.  Patient Location: PACU  Anesthesia Type:General  Level of Consciousness: awake, alert , oriented and patient cooperative  Airway and Oxygen Therapy: Patient Spontanous Breathing  Post-op Pain: mild  Post-op Assessment: Post-op Vital signs reviewed, Patient's Cardiovascular Status Stable, Respiratory Function Stable, Patent Airway, No signs of Nausea or vomiting and Pain level controlled  Post-op Vital Signs: Reviewed and stable  Complications: No apparent anesthesia complications

## 2013-02-10 NOTE — Progress Notes (Signed)
Subjective:  The patient is alert and pleasant. She looks well. She is in no apparent distress.  Objective: Vital signs in last 24 hours: Temp:  [96.6 F (35.9 C)-98 F (36.7 C)] 96.6 F (35.9 C) (05/01 0921) Pulse Rate:  [89-109] 89 (05/01 1000) Resp:  [13-20] 13 (05/01 1000) BP: (134-167)/(74-111) 134/74 mmHg (05/01 1000) SpO2:  [93 %-100 %] 99 % (05/01 1000)  Intake/Output from previous day:   Intake/Output this shift: Total I/O In: 1200 [I.V.:1200] Out: -   Physical exam patient is moving her lower extremities well.  Lab Results:  Recent Labs  02/07/13 1522  WBC 8.2  HGB 14.7  HCT 43.2  PLT 197   BMET  Recent Labs  02/07/13 1522  NA 137  K 4.5  CL 104  CO2 21  GLUCOSE 369*  BUN 15  CREATININE 0.59  CALCIUM 9.2    Studies/Results: No results found.  Assessment/Plan: The patient is doing well.  LOS: 0 days     Heidie Krall D 02/10/2013, 10:05 AM

## 2013-02-10 NOTE — Progress Notes (Signed)
Patient slightly nauseous after getting up to wheel chair.  Phenergan not given, secondary to patient moving to 3500.  Patient given wet towel and basin.  Morrie Sheldon on 3500 aware

## 2013-02-11 LAB — GLUCOSE, CAPILLARY: Glucose-Capillary: 213 mg/dL — ABNORMAL HIGH (ref 70–99)

## 2013-02-11 MED ORDER — DSS 100 MG PO CAPS: 100.0000 mg | ORAL_CAPSULE | Freq: Two times a day (BID) | ORAL | Status: DC

## 2013-02-11 MED ORDER — HYDROMORPHONE HCL 2 MG PO TABS: 2.0000 mg | ORAL_TABLET | ORAL | Status: DC | PRN

## 2013-02-11 MED ORDER — DIAZEPAM 5 MG PO TABS: 5.0000 mg | ORAL_TABLET | Freq: Four times a day (QID) | ORAL | Status: DC | PRN

## 2013-02-11 NOTE — Discharge Summary (Signed)
Physician Discharge Summary  Patient ID: Abigail Collins MRN: [id number] DOB/AGE: 1951/07/20 62 y.o.  Admit date: 02/10/2013 Discharge date: 02/11/2013  Admission Diagnoses: L2 compression fracture, lumbago  Discharge Diagnoses: The same Active Problems:   * No active hospital problems. *   Discharged Condition: good  Hospital Course: I performed a L2 kyphoplasty 02/10/2013. The patient's postoperative course was unremarkable. On postop day #1 the patient looked and felt well. She requested discharge to home. She was given oral and written discharge instructions. All her questions were answered.  Consults: None Significant Diagnostic Studies: None Treatments: L2 kyphoplasty Discharge Exam: Blood pressure 109/56, pulse 94, temperature 98.8 F (37.1 C), temperature source Oral, resp. rate 16, SpO2 92.00%. The patient is alert and oriented. Her lower extremity strength is normal. Her wounds are healing well without discharge.  Disposition: Home  Discharge Orders   Future Appointments Provider Department Dept Phone   03/08/2013 3:15 PM Erick Colace, MD [phone]   Future Orders Complete By Expires     Call MD for:  difficulty breathing, headache or visual disturbances  As directed     Call MD for:  extreme fatigue  As directed     Call MD for:  hives  As directed     Call MD for:  persistant dizziness or light-headedness  As directed     Call MD for:  persistant nausea and vomiting  As directed     Call MD for:  redness, tenderness, or signs of infection (pain, swelling, redness, odor or green/yellow discharge around incision site)  As directed     Call MD for:  severe uncontrolled pain  As directed     Call MD for:  temperature >100.4  As directed     Diet - low sodium heart healthy  As directed     Discharge instructions  As directed     Comments:      Call [phone] for a followup appointment. Take a stool softener while you are  using pain medications.    Driving Restrictions  As directed     Comments:      Do not drive for 2 weeks.    Increase activity slowly  As directed     Lifting restrictions  As directed     Comments:      Do not lift more than 5 pounds. No excessive bending or twisting.    May shower / Bathe  As directed     Comments:      He may shower after the pain she is removed 3 days after surgery. Leave the incision alone.    No dressing needed  As directed         Medication List    STOP taking these medications       HYDROcodone-acetaminophen 5-325 MG per tablet  Commonly known as:  NORCO/VICODIN      TAKE these medications       albuterol 108 (90 BASE) MCG/ACT inhaler  Commonly known as:  PROVENTIL HFA;VENTOLIN HFA  Inhale 2 puffs into the lungs every 6 (six) hours as needed. For shortness of breath.     ALKA-SELTZER ANTACID PO  Take 1 tablet by mouth daily as needed (upset stomach).     diazepam 5 MG tablet  Commonly known as:  VALIUM  Take 1 tablet (5 mg total) by mouth every 6 (six) hours as needed.     DSS 100 MG Caps  Take 100 mg by  mouth 2 (two) times daily.     HYDROmorphone 2 MG tablet  Commonly known as:  DILAUDID  Take 1 tablet (2 mg total) by mouth every 4 (four) hours as needed for pain.     ketoconazole 2 % cream  Commonly known as:  NIZORAL  Apply 1 application topically 2 (two) times daily as needed (yeast infection).     Liraglutide 18 MG/3ML Soln injection  Commonly known as:  VICTOZA  Inject 0.3 mLs (1.8 mg total) into the skin daily.     metFORMIN 1000 MG tablet  Commonly known as:  GLUCOPHAGE  Take 1 tablet (1,000 mg total) by mouth 2 (two) times daily with a meal.     mometasone-formoterol 100-5 MCG/ACT Aero  Commonly known as:  DULERA  Inhale 2 puffs into the lungs 2 (two) times daily.     naproxen sodium 220 MG tablet  Commonly known as:  ANAPROX  Take 220 mg by mouth 2 (two) times daily with a meal.         Signed: Shanteria Laye  D 02/11/2013, 7:14 AM

## 2013-02-11 NOTE — Progress Notes (Signed)
Pt given D/C instructions with Rx's, verbal understanding given. Pt D/C'd home via wheelchair @ 1120 per MD order. Rema Fendt, RN

## 2013-02-14 ENCOUNTER — Encounter (HOSPITAL_COMMUNITY): Payer: Self-pay | Admitting: Neurosurgery

## 2013-03-08 ENCOUNTER — Ambulatory Visit: Payer: BC Managed Care – PPO | Admitting: Physical Medicine & Rehabilitation

## 2013-04-04 ENCOUNTER — Other Ambulatory Visit: Payer: Self-pay | Admitting: Internal Medicine

## 2013-04-04 DIAGNOSIS — E1149 Type 2 diabetes mellitus with other diabetic neurological complication: Secondary | ICD-10-CM

## 2013-04-04 DIAGNOSIS — E1165 Type 2 diabetes mellitus with hyperglycemia: Secondary | ICD-10-CM

## 2013-04-04 DIAGNOSIS — IMO0002 Reserved for concepts with insufficient information to code with codable children: Secondary | ICD-10-CM

## 2013-04-04 LAB — HM DIABETES EYE EXAM

## 2013-04-04 MED ORDER — METFORMIN HCL 1000 MG PO TABS: 1000.0000 mg | ORAL_TABLET | Freq: Two times a day (BID) | ORAL | Status: DC

## 2013-04-04 NOTE — Telephone Encounter (Signed)
Please advise refill? Are you the prescribing physician? Metformin 1000 mg 1 2 x daily.

## 2013-04-04 NOTE — Telephone Encounter (Signed)
Abigail Collins calling to request refill on Metformin.  She was last seen 12-31-12.  She has called pharmacy to request but has not received any reply.

## 2013-06-16 ENCOUNTER — Telehealth: Payer: Self-pay | Admitting: Internal Medicine

## 2013-06-16 DIAGNOSIS — E1149 Type 2 diabetes mellitus with other diabetic neurological complication: Secondary | ICD-10-CM

## 2013-06-16 DIAGNOSIS — E1165 Type 2 diabetes mellitus with hyperglycemia: Secondary | ICD-10-CM

## 2013-06-16 DIAGNOSIS — IMO0002 Reserved for concepts with insufficient information to code with codable children: Secondary | ICD-10-CM

## 2013-06-16 MED ORDER — METFORMIN HCL 1000 MG PO TABS: 1000.0000 mg | ORAL_TABLET | Freq: Two times a day (BID) | ORAL | Status: DC

## 2013-06-16 NOTE — Telephone Encounter (Signed)
Pt request refill for metformin to be send to [facility]. Pt stated check with drug store and they never got anything. Pt also request sample for Victoza. Please call pt back

## 2013-06-29 ENCOUNTER — Telehealth: Payer: Self-pay | Admitting: Internal Medicine

## 2013-06-29 NOTE — Telephone Encounter (Signed)
Patient Information:  Caller Name: DEBRA  Phone: [phone]  Patient: Abigail Collins  Gender: Female  DOB: 1951/09/17  Age: 62 Years  PCP: [name] (Adults only)  Office Follow Up:  Does the office need to follow up with this patient?: Yes  Instructions For The Office: Has blood sugar of   Symptoms  Reason For Call & Symptoms: Kailin states she had blood glucose of 303 @ 1400. Sleeping more than usual. Dry mouth. No vomiting.  Blood glucose at 16:11 is 295.  States" vision has changed - cannot see television or far off with her glasses on". States she had  eye exam in June 2014 and received a new eyeglass prescription but has not picked out new glasses. States she was told her current eye prescription is close to what glasses she already has at home.   Has had intermittent headache x 2 weeks. States no headache on 06/29/13.States Metformin makes her feel nauseated at times. States she is gaining weight but is eating more healthy. States she is less active. Asking if okay to eat tomatoes when she is hungry?  Per diabetes high blood sugar has discuss with PCP and callback by nurse within one hour due to blood glucose > 300 nad two or more times in a row. Requesting an appointment on 9/20 as she states this is the only day she can come to office. States scheduler told her to call back on Friday 9/19./14.  Reviewed Health History In EMR: Yes  Reviewed Medications In EMR: Yes  Reviewed Allergies In EMR: Yes  Reviewed Surgeries / Procedures: Yes  Date of Onset of Symptoms: 06/29/2013  Guideline(s) Used:  Diabetes - High Blood Sugar  Headache  Disposition Per Guideline:   Discuss with PCP and Callback by Nurse within 1 Hour  Reason For Disposition Reached:   Blood glucose > 300 mg/dl (09.8 mmol/l) AND two or more times in a row  Advice Given:  N/A  Patient Will Follow Care Advice:  YES

## 2013-07-02 ENCOUNTER — Encounter: Payer: Self-pay | Admitting: Family Medicine

## 2013-07-02 ENCOUNTER — Ambulatory Visit (INDEPENDENT_AMBULATORY_CARE_PROVIDER_SITE_OTHER): Payer: No Typology Code available for payment source | Admitting: Family Medicine

## 2013-07-02 VITALS — BP 144/88 | HR 100 | Temp 98.6°F | Wt 236.0 lb

## 2013-07-02 DIAGNOSIS — E1149 Type 2 diabetes mellitus with other diabetic neurological complication: Secondary | ICD-10-CM

## 2013-07-02 DIAGNOSIS — IMO0002 Reserved for concepts with insufficient information to code with codable children: Secondary | ICD-10-CM

## 2013-07-02 DIAGNOSIS — E1142 Type 2 diabetes mellitus with diabetic polyneuropathy: Secondary | ICD-10-CM

## 2013-07-02 DIAGNOSIS — E1165 Type 2 diabetes mellitus with hyperglycemia: Secondary | ICD-10-CM

## 2013-07-02 LAB — POCT CBG (FASTING - GLUCOSE)-MANUAL ENTRY: Glucose Fasting, POC: 316 mg/dL — AB (ref 70–99)

## 2013-07-02 MED ORDER — BAYER CONTOUR NEXT EZ W/DEVICE KIT: 1.0000 | PACK | Status: DC

## 2013-07-02 NOTE — Progress Notes (Signed)
Subjective:    Patient ID: Abigail Collins, female    DOB: 1951-04-07, 62 y.o.   MRN: [id number]  HPI Patient seen Saturday morning clinic with chief complaint of poorly controlled blood sugars Type 2 diabetes. She currently takes metformin 1000 mg twice a day. She had been on Victoza but ran out secondary to cost issues. When she was taking this in combination with metformin her blood sugars were fairly well controlled. Recent CBGs are around 250 to 300 fasting. She's had recent increased symptoms of polyuria, polydipsia, and some blurred vision. No recent prednisone use. No recent infection issues. Increased fatigue since her blood sugars been poorly controlled.  Denies any recent fevers or chills. No recent dysuria. No cough.  Past Medical History  Diagnosis Date  . Thyroid disease 1960's    "don't have it now" (09/14/2012)  . Bulging disc     "lower"  . PONV (postoperative nausea and vomiting)   . Anginal pain     "left arm pain, sees Dr. Dione Housekeeper, had card cath 2013"  . Hyperlipidemia   . Hypertension     sees Dr. Nolon Bussing, primary  . Asthma     takes inhaler 2x day  . Bronchitis     hx of  . Urinary tract infection     hx of  . Anemia     hx of  . Exertional dyspnea     "sometimes laying down" (09/14/2012)  . Type II diabetes mellitus   . Carpal tunnel syndrome of left wrist   . Arthritis     "all over" (09/14/2012)  . Pneumonia 03/2012  . Vomiting     pt states she vomits every am  . Depression     denies   Past Surgical History  Procedure Laterality Date  . Cardiac catheterization  05/13/2012    mod luminal irregularity of pLAD, no sign CAD, EF 65%.  . Total knee arthroplasty  09/13/2012    Procedure: TOTAL KNEE ARTHROPLASTY;  Surgeon: Dannielle Huh, MD;  Location: [facility] OR;  Service: Orthopedics;  Laterality: Right;  . Vaginal hysterectomy  1970's  . Cholecystectomy  1970's  . Tubal ligation  1970's  . Total elbow replacement  ~ 2005    "right" (09/14/2012)   . Kyphoplasty N/A 02/10/2013    Procedure:  LUMBAR TWO KYPHOPLASTY;  Surgeon: Cristi Loron, MD;  Location: [facility];  Service: Neurosurgery;  Laterality: N/A;  L2 Kyphoplasty; Will use Stern's Carm.    reports that she quit smoking about 3 years ago. Her smoking use included Cigarettes. She has a 5 pack-year smoking history. She has never used smokeless tobacco. She reports that she does not drink alcohol or use illicit drugs. family history includes Arthritis in her father and mother; Diabetes in her father; Hypertension in her father. Allergies  Allergen Reactions  . Lisinopril Shortness Of Breath  . Tape Other (See Comments)    Tore skin--?hypofix  . Percocet [Oxycodone-Acetaminophen] Nausea And Vomiting      Review of Systems  Constitutional: Positive for fatigue.  Eyes: Positive for visual disturbance.  Respiratory: Negative for cough, chest tightness, shortness of breath and wheezing.   Cardiovascular: Negative for chest pain, palpitations and leg swelling.  Endocrine: Positive for polydipsia and polyuria.  Neurological: Negative for dizziness, seizures, syncope, weakness, light-headedness and headaches.       Objective:   Physical Exam  Constitutional: She is oriented to person, place, and time. She appears well-developed and well-nourished.  HENT:  Right Ear: External ear normal.  Left Ear: External ear normal.  Mouth/Throat: Oropharynx is clear and moist.  Neck: Neck supple. No thyromegaly present.  Cardiovascular: Normal rate.   Pulmonary/Chest: Effort normal and breath sounds normal. No respiratory distress. She has no wheezes. She has no rales.  Musculoskeletal: She exhibits no edema.  Neurological: She is alert and oriented to person, place, and time.  Psychiatric: She has a normal mood and affect. Her behavior is normal.          Assessment & Plan:  Type 2 diabetes poorly controlled. We emphasized that she needs close followup with primary. Continue  metformin. Provided samples of Victoza to have her titrate back slowly.. Schedule followup with primary within 3-4 weeks to reassess.  We discussed other possible treatments such as insulin but will defer to primary.

## 2013-07-02 NOTE — Addendum Note (Signed)
Addended by: Azucena Freed on: 07/02/2013 12:28 PM   Modules accepted: Orders

## 2013-07-02 NOTE — Patient Instructions (Addendum)
Start back Victoza 0.6 mg once daily for one week then 1.2 mg daily until follow up with Dr Yetta Barre.

## 2013-07-18 ENCOUNTER — Ambulatory Visit: Payer: No Typology Code available for payment source | Admitting: Internal Medicine

## 2013-07-27 ENCOUNTER — Ambulatory Visit: Payer: No Typology Code available for payment source | Admitting: Internal Medicine

## 2013-08-03 ENCOUNTER — Encounter: Payer: Self-pay | Admitting: Internal Medicine

## 2013-08-03 ENCOUNTER — Ambulatory Visit (INDEPENDENT_AMBULATORY_CARE_PROVIDER_SITE_OTHER): Payer: No Typology Code available for payment source | Admitting: Internal Medicine

## 2013-08-03 ENCOUNTER — Other Ambulatory Visit (INDEPENDENT_AMBULATORY_CARE_PROVIDER_SITE_OTHER): Payer: No Typology Code available for payment source

## 2013-08-03 VITALS — BP 136/98 | HR 105 | Temp 97.7°F | Resp 16 | Ht 64.5 in | Wt 233.0 lb

## 2013-08-03 DIAGNOSIS — Z0000 Encounter for general adult medical examination without abnormal findings: Secondary | ICD-10-CM | POA: Insufficient documentation

## 2013-08-03 DIAGNOSIS — I10 Essential (primary) hypertension: Secondary | ICD-10-CM

## 2013-08-03 DIAGNOSIS — E1142 Type 2 diabetes mellitus with diabetic polyneuropathy: Secondary | ICD-10-CM

## 2013-08-03 DIAGNOSIS — K3184 Gastroparesis: Secondary | ICD-10-CM

## 2013-08-03 DIAGNOSIS — R7401 Elevation of levels of liver transaminase levels: Secondary | ICD-10-CM

## 2013-08-03 DIAGNOSIS — E781 Pure hyperglyceridemia: Secondary | ICD-10-CM

## 2013-08-03 DIAGNOSIS — Z1231 Encounter for screening mammogram for malignant neoplasm of breast: Secondary | ICD-10-CM

## 2013-08-03 DIAGNOSIS — IMO0002 Reserved for concepts with insufficient information to code with codable children: Secondary | ICD-10-CM

## 2013-08-03 DIAGNOSIS — R7402 Elevation of levels of lactic acid dehydrogenase (LDH): Secondary | ICD-10-CM

## 2013-08-03 DIAGNOSIS — E1149 Type 2 diabetes mellitus with other diabetic neurological complication: Secondary | ICD-10-CM

## 2013-08-03 DIAGNOSIS — E1143 Type 2 diabetes mellitus with diabetic autonomic (poly)neuropathy: Secondary | ICD-10-CM

## 2013-08-03 DIAGNOSIS — E1165 Type 2 diabetes mellitus with hyperglycemia: Secondary | ICD-10-CM

## 2013-08-03 LAB — COMPREHENSIVE METABOLIC PANEL WITH GFR
ALT: 41 U/L — ABNORMAL HIGH (ref 0–35)
AST: 53 U/L — ABNORMAL HIGH (ref 0–37)
Albumin: 4.2 g/dL (ref 3.5–5.2)
Alkaline Phosphatase: 83 U/L (ref 39–117)
BUN: 13 mg/dL (ref 6–23)
CO2: 27 meq/L (ref 19–32)
Calcium: 10.2 mg/dL (ref 8.4–10.5)
Chloride: 103 meq/L (ref 96–112)
Creatinine, Ser: 0.7 mg/dL (ref 0.4–1.2)
GFR: 90.29 mL/min
Glucose, Bld: 222 mg/dL — ABNORMAL HIGH (ref 70–99)
Potassium: 4.4 meq/L (ref 3.5–5.1)
Sodium: 141 meq/L (ref 135–145)
Total Bilirubin: 0.7 mg/dL (ref 0.3–1.2)
Total Protein: 7.6 g/dL (ref 6.0–8.3)

## 2013-08-03 LAB — URINALYSIS, ROUTINE W REFLEX MICROSCOPIC
Bilirubin Urine: NEGATIVE
Hgb urine dipstick: NEGATIVE
Ketones, ur: 15
Leukocytes, UA: NEGATIVE
Nitrite: NEGATIVE
Specific Gravity, Urine: >=1.03
Total Protein, Urine: NEGATIVE
Urine Glucose: 250
Urobilinogen, UA: 0.2
pH: 5.5 (ref 5.0–8.0)

## 2013-08-03 LAB — CBC WITH DIFFERENTIAL/PLATELET
Basophils Absolute: 0 10*3/uL (ref 0.0–0.1)
Basophils Relative: 0.4 % (ref 0.0–3.0)
Eosinophils Absolute: 0.2 10*3/uL (ref 0.0–0.7)
Eosinophils Relative: 2.2 % (ref 0.0–5.0)
HCT: 45.3 % (ref 36.0–46.0)
Hemoglobin: 15.4 g/dL — ABNORMAL HIGH (ref 12.0–15.0)
Lymphocytes Relative: 33.3 % (ref 12.0–46.0)
Lymphs Abs: 3.1 10*3/uL (ref 0.7–4.0)
MCHC: 34.1 g/dL (ref 30.0–36.0)
MCV: 92.3 fl (ref 78.0–100.0)
Monocytes Absolute: 0.5 10*3/uL (ref 0.1–1.0)
Monocytes Relative: 4.9 % (ref 3.0–12.0)
Neutro Abs: 5.6 10*3/uL (ref 1.4–7.7)
Neutrophils Relative %: 59.2 % (ref 43.0–77.0)
Platelets: 195 10*3/uL (ref 150.0–400.0)
RBC: 4.9 Mil/uL (ref 3.87–5.11)
RDW: 12.6 % (ref 11.5–14.6)
WBC: 9.4 10*3/uL (ref 4.5–10.5)

## 2013-08-03 LAB — LIPID PANEL
Cholesterol: 165 mg/dL (ref 0–200)
HDL: 59.4 mg/dL (ref >39.00)
Total CHOL/HDL Ratio: 3
Triglycerides: 296 mg/dL — ABNORMAL HIGH (ref 0.0–149.0)
VLDL: 59.2 mg/dL — ABNORMAL HIGH (ref 0.0–40.0)

## 2013-08-03 LAB — HEMOGLOBIN A1C: Hgb A1c MFr Bld: 10.9 % — ABNORMAL HIGH (ref 4.6–6.5)

## 2013-08-03 LAB — TSH: TSH: 2.22 u[IU]/mL (ref 0.35–5.50)

## 2013-08-03 LAB — MICROALBUMIN / CREATININE URINE RATIO
Creatinine,U: 140.3 mg/dL
Microalb Creat Ratio: 1.6 mg/g (ref 0.0–30.0)
Microalb, Ur: 2.3 mg/dL — ABNORMAL HIGH (ref 0.0–1.9)

## 2013-08-03 LAB — LDL CHOLESTEROL, DIRECT: Direct LDL: 75.4 mg/dL

## 2013-08-03 MED ORDER — PITAVASTATIN CALCIUM 2 MG PO TABS
1.0000 | ORAL_TABLET | Freq: Every day | ORAL | Status: DC
Start: 2013-08-03 — End: 2013-12-22

## 2013-08-03 MED ORDER — DAPAGLIFLOZIN PROPANEDIOL 5 MG PO TABS: 1.0000 | ORAL_TABLET | Freq: Every day | ORAL | Status: DC

## 2013-08-03 MED ORDER — AMLODIPINE-OLMESARTAN 5-20 MG PO TABS: 1.0000 | ORAL_TABLET | Freq: Every day | ORAL | Status: DC

## 2013-08-03 MED ORDER — SITAGLIP PHOS-METFORMIN HCL ER 100-1000 MG PO TB24: 1.0000 | ORAL_TABLET | Freq: Every day | ORAL | Status: DC

## 2013-08-03 MED ORDER — METOCLOPRAMIDE HCL 5 MG PO TABS: 5.0000 mg | ORAL_TABLET | Freq: Four times a day (QID) | ORAL | Status: DC

## 2013-08-03 NOTE — Progress Notes (Signed)
Subjective:    Patient ID: Abigail Collins, female    DOB: 1951-06-26, 62 y.o.   MRN: [id number]  Hypertension This is a chronic problem. The current episode started more than 1 year ago. The problem has been gradually worsening since onset. The problem is uncontrolled. Pertinent negatives include no anxiety, blurred vision, chest pain, headaches, malaise/fatigue, neck pain, orthopnea, palpitations, peripheral edema, PND, shortness of breath or sweats. Past treatments include diuretics. The current treatment provides mild improvement. Compliance problems include psychosocial issues, exercise and diet.       Review of Systems  Constitutional: Positive for fatigue. Negative for fever, chills, malaise/fatigue, diaphoresis, activity change, appetite change and unexpected weight change.  HENT: Negative.   Eyes: Negative.  Negative for blurred vision.  Respiratory: Negative.  Negative for cough, choking, chest tightness, shortness of breath, wheezing and stridor.   Cardiovascular: Negative.  Negative for chest pain, palpitations, orthopnea, leg swelling and PND.  Gastrointestinal: Positive for nausea. Negative for vomiting, abdominal pain, diarrhea, constipation, blood in stool, abdominal distention, anal bleeding and rectal pain.       She has had recurrent episodes of nausea and loss of appetite but no vomiting. She feels a fullness in her upper abd when she eats. She has had these symptoms for over one year but she feels like they have worsened over the last 2 months.  Endocrine: Negative.  Negative for polydipsia, polyphagia and polyuria.  Genitourinary: Negative.   Musculoskeletal: Positive for arthralgias (knees). Negative for back pain, gait problem, joint swelling, myalgias, neck pain and neck stiffness.  Skin: Negative.   Allergic/Immunologic: Negative.   Neurological: Negative.  Negative for dizziness, tremors, speech difficulty, weakness, light-headedness and headaches.  Hematological:  Negative.  Negative for adenopathy. Does not bruise/bleed easily.  Psychiatric/Behavioral: Negative.        Objective:   Physical Exam  Vitals reviewed. Constitutional: She is oriented to person, place, and time. She appears well-developed and well-nourished.  Non-toxic appearance. She does not have a sickly appearance. She does not appear ill. No distress.  HENT:  Head: Normocephalic and atraumatic.  Mouth/Throat: Oropharynx is clear and moist. No oropharyngeal exudate.  Eyes: Conjunctivae are normal. Right eye exhibits no discharge. Left eye exhibits no discharge. No scleral icterus.  Neck: Normal range of motion. Neck supple. No JVD present. No tracheal deviation present. No thyromegaly present.  Cardiovascular: Normal rate, regular rhythm, normal heart sounds and intact distal pulses.  Exam reveals no gallop and no friction rub.   No murmur heard. Pulmonary/Chest: Effort normal and breath sounds normal. No stridor. No respiratory distress. She has no wheezes. She has no rales. Chest wall is not dull to percussion. She exhibits no mass, no tenderness, no bony tenderness, no laceration, no crepitus, no edema, no deformity, no swelling and no retraction. Right breast exhibits no inverted nipple, no mass, no nipple discharge, no skin change and no tenderness. Left breast exhibits no inverted nipple, no mass, no nipple discharge, no skin change and no tenderness. Breasts are symmetrical.  Abdominal: Soft. Bowel sounds are normal. She exhibits no distension and no mass. There is no tenderness. There is no rebound and no guarding.  Musculoskeletal: Normal range of motion. She exhibits no edema and no tenderness.  Lymphadenopathy:    She has no cervical adenopathy.  Neurological: She is oriented to person, place, and time.  Skin: Skin is warm and dry. No rash noted. She is not diaphoretic. No erythema. No pallor.  Psychiatric: She has a normal  mood and affect. Her behavior is normal. Judgment and  thought content normal.     Lab Results  Component Value Date   WBC 8.2 02/07/2013   HGB 14.7 02/07/2013   HCT 43.2 02/07/2013   PLT 197 02/07/2013   GLUCOSE 369* 02/07/2013   CHOL 172 08/05/2012   TRIG 248.0* 08/05/2012   HDL 43.60 08/05/2012   LDLDIRECT 92.7 08/05/2012   LDLCALC UNABLE TO CALCULATE IF TRIGLYCERIDE OVER 400 mg/dL 1/61/0960   ALT 36* 45/40/9811   AST 38* 09/03/2012   NA 137 02/07/2013   K 4.5 02/07/2013   CL 104 02/07/2013   CREATININE 0.59 02/07/2013   BUN 15 02/07/2013   CO2 21 02/07/2013   TSH 2.60 08/05/2012   INR 0.88 09/03/2012   HGBA1C 8.0* 09/13/2012   MICROALBUR 3.0* 08/05/2012       Assessment & Plan:

## 2013-08-03 NOTE — Patient Instructions (Signed)

## 2013-08-04 ENCOUNTER — Encounter: Payer: Self-pay | Admitting: Internal Medicine

## 2013-08-04 DIAGNOSIS — R7402 Elevation of levels of lactic acid dehydrogenase (LDH): Secondary | ICD-10-CM | POA: Insufficient documentation

## 2013-08-04 DIAGNOSIS — R7401 Elevation of levels of liver transaminase levels: Secondary | ICD-10-CM | POA: Insufficient documentation

## 2013-08-04 NOTE — Assessment & Plan Note (Signed)
She will try reglan

## 2013-08-04 NOTE — Assessment & Plan Note (Signed)
She will start Azor for BP control

## 2013-08-04 NOTE — Assessment & Plan Note (Signed)
Her blood sugar is not well controlled I have changed her meds to get better control

## 2013-08-04 NOTE — Assessment & Plan Note (Signed)
Exam done Vaccines were reviewed She was referred for mammo and colonoscopy Labs ordered Pt ed material was given 

## 2013-08-04 NOTE — Assessment & Plan Note (Signed)
I have ordered an U/S to see if she has fatty liver disease

## 2013-08-31 ENCOUNTER — Encounter: Payer: Self-pay | Admitting: Internal Medicine

## 2013-08-31 ENCOUNTER — Ambulatory Visit (INDEPENDENT_AMBULATORY_CARE_PROVIDER_SITE_OTHER): Payer: No Typology Code available for payment source | Admitting: Internal Medicine

## 2013-08-31 VITALS — BP 126/84 | HR 96 | Temp 98.0°F | Resp 16 | Ht 64.5 in | Wt 237.0 lb

## 2013-08-31 DIAGNOSIS — E1165 Type 2 diabetes mellitus with hyperglycemia: Secondary | ICD-10-CM

## 2013-08-31 DIAGNOSIS — E1142 Type 2 diabetes mellitus with diabetic polyneuropathy: Secondary | ICD-10-CM

## 2013-08-31 DIAGNOSIS — E1149 Type 2 diabetes mellitus with other diabetic neurological complication: Secondary | ICD-10-CM

## 2013-08-31 DIAGNOSIS — I10 Essential (primary) hypertension: Secondary | ICD-10-CM

## 2013-08-31 DIAGNOSIS — Z0000 Encounter for general adult medical examination without abnormal findings: Secondary | ICD-10-CM

## 2013-08-31 DIAGNOSIS — IMO0002 Reserved for concepts with insufficient information to code with codable children: Secondary | ICD-10-CM

## 2013-08-31 LAB — GLUCOSE, POCT (MANUAL RESULT ENTRY): POC Glucose: 161 mg/dl — AB (ref 70–99)

## 2013-08-31 MED ORDER — SITAGLIP PHOS-METFORMIN HCL ER 50-1000 MG PO TB24: 2.0000 | ORAL_TABLET | Freq: Every day | ORAL | Status: DC

## 2013-08-31 NOTE — Progress Notes (Signed)
Pre visit review using our clinic review tool, if applicable. No additional management support is needed unless otherwise documented below in the visit note. 

## 2013-08-31 NOTE — Addendum Note (Signed)
Addended by: [name] on: 08/31/2013 03:18 PM   Modules accepted: Orders

## 2013-08-31 NOTE — Assessment & Plan Note (Signed)
Her BP is well controlled 

## 2013-08-31 NOTE — Assessment & Plan Note (Signed)
He blood sugar remains a little too high so I have changed the Janumet-XR to give her 2000 mg of metformin per day, she will stay on januvia and farxiga

## 2013-08-31 NOTE — Patient Instructions (Signed)
Type 2 Diabetes Mellitus, Adult Type 2 diabetes mellitus, often simply referred to as type 2 diabetes, is a long-lasting (chronic) disease. In type 2 diabetes, the pancreas does not make enough insulin (a hormone), the cells are less responsive to the insulin that is made (insulin resistance), or both. Normally, insulin moves sugars from food into the tissue cells. The tissue cells use the sugars for energy. The lack of insulin or the lack of normal response to insulin causes excess sugars to build up in the blood instead of going into the tissue cells. As a result, high blood sugar (hyperglycemia) develops. The effect of high sugar (glucose) levels can cause many complications. Type 2 diabetes was also previously called adult-onset diabetes but it can occur at any age.  RISK FACTORS  A person is predisposed to developing type 2 diabetes if someone in the family has the disease and also has one or more of the following primary risk factors:  Overweight.  An inactive lifestyle.  A history of consistently eating high-calorie foods. Maintaining a normal weight and regular physical activity can reduce the chance of developing type 2 diabetes. SYMPTOMS  A person with type 2 diabetes may not show symptoms initially. The symptoms of type 2 diabetes appear slowly. The symptoms include:  Increased thirst (polydipsia).  Increased urination (polyuria).  Increased urination during the night (nocturia).  Weight loss. This weight loss may be rapid.  Frequent, recurring infections.  Tiredness (fatigue).  Weakness.  Vision changes, such as blurred vision.  Fruity smell to your breath.  Abdominal pain.  Nausea or vomiting.  Cuts or bruises which are slow to heal.  Tingling or numbness in the hands or feet. DIAGNOSIS Type 2 diabetes is frequently not diagnosed until complications of diabetes are present. Type 2 diabetes is diagnosed when symptoms or complications are present and when blood  glucose levels are increased. Your blood glucose level may be checked by one or more of the following blood tests:  A fasting blood glucose test. You will not be allowed to eat for at least 8 hours before a blood sample is taken.  A random blood glucose test. Your blood glucose is checked at any time of the day regardless of when you ate.  A hemoglobin A1c blood glucose test. A hemoglobin A1c test provides information about blood glucose control over the previous 3 months.  An oral glucose tolerance test (OGTT). Your blood glucose is measured after you have not eaten (fasted) for 2 hours and then after you drink a glucose-containing beverage. TREATMENT   You may need to take insulin or diabetes medicine daily to keep blood glucose levels in the desired range.  You will need to match insulin dosing with exercise and healthy food choices. The treatment goal is to maintain the before meal blood sugar (preprandial glucose) level at 70 130 mg/dL. HOME CARE INSTRUCTIONS   Have your hemoglobin A1c level checked twice a year.  Perform daily blood glucose monitoring as directed by your caregiver.  Monitor urine ketones when you are ill and as directed by your caregiver.  Take your diabetes medicine or insulin as directed by your caregiver to maintain your blood glucose levels in the desired range.  Never run out of diabetes medicine or insulin. It is needed every day.  Adjust insulin based on your intake of carbohydrates. Carbohydrates can raise blood glucose levels but need to be included in your diet. Carbohydrates provide vitamins, minerals, and fiber which are an essential part of   a healthy diet. Carbohydrates are found in fruits, vegetables, whole grains, dairy products, legumes, and foods containing added sugars.    Eat healthy foods. Alternate 3 meals with 3 snacks.  Lose weight if overweight.  Carry a medical alert card or wear your medical alert jewelry.  Carry a 15 gram  carbohydrate snack with you at all times to treat low blood glucose (hypoglycemia). Some examples of 15 gram carbohydrate snacks include:  Glucose tablets, 3 or 4   Glucose gel, 15 gram tube  Raisins, 2 tablespoons (24 grams)  Jelly beans, 6  Animal crackers, 8  Regular pop, 4 ounces (120 mL)  Gummy treats, 9  Recognize hypoglycemia. Hypoglycemia occurs with blood glucose levels of 70 mg/dL and below. The risk for hypoglycemia increases when fasting or skipping meals, during or after intense exercise, and during sleep. Hypoglycemia symptoms can include:  Tremors or shakes.  Decreased ability to concentrate.  Sweating.  Increased heart rate.  Headache.  Dry mouth.  Hunger.  Irritability.  Anxiety.  Restless sleep.  Altered speech or coordination.  Confusion.  Treat hypoglycemia promptly. If you are alert and able to safely swallow, follow the 15:15 rule:  Take 15 20 grams of rapid-acting glucose or carbohydrate. Rapid-acting options include glucose gel, glucose tablets, or 4 ounces (120 mL) of fruit juice, regular soda, or low fat milk.  Check your blood glucose level 15 minutes after taking the glucose.  Take 15 20 grams more of glucose if the repeat blood glucose level is still 70 mg/dL or below.  Eat a meal or snack within 1 hour once blood glucose levels return to normal.    Be alert to polyuria and polydipsia which are early signs of hyperglycemia. An early awareness of hyperglycemia allows for prompt treatment. Treat hyperglycemia as directed by your caregiver.  Engage in at least 150 minutes of moderate-intensity physical activity a week, spread over at least 3 days of the week or as directed by your caregiver. In addition, you should engage in resistance exercise at least 2 times a week or as directed by your caregiver.  Adjust your medicine and food intake as needed if you start a new exercise or sport.  Follow your sick day plan at any time you  are unable to eat or drink as usual.  Avoid tobacco use.  Limit alcohol intake to no more than 1 drink per day for nonpregnant women and 2 drinks per day for men. You should drink alcohol only when you are also eating food. Talk with your caregiver whether alcohol is safe for you. Tell your caregiver if you drink alcohol several times a week.  Follow up with your caregiver regularly.  Schedule an eye exam soon after the diagnosis of type 2 diabetes and then annually.  Perform daily skin and foot care. Examine your skin and feet daily for cuts, bruises, redness, nail problems, bleeding, blisters, or sores. A foot exam by a caregiver should be done annually.  Brush your teeth and gums at least twice a day and floss at least once a day. Follow up with your dentist regularly.  Share your diabetes management plan with your workplace or school.  Stay up-to-date with immunizations.  Learn to manage stress.  Obtain ongoing diabetes education and support as needed.  Participate in, or seek rehabilitation as needed to maintain or improve independence and quality of life. Request a physical or occupational therapy referral if you are having foot or hand numbness or difficulties with grooming,   dressing, eating, or physical activity. SEEK MEDICAL CARE IF:   You are unable to eat food or drink fluids for more than 6 hours.  You have nausea and vomiting for more than 6 hours.  Your blood glucose level is over 240 mg/dL.  There is a change in mental status.  You develop an additional serious illness.  You have diarrhea for more than 6 hours.  You have been sick or have had a fever for a couple of days and are not getting better.  You have pain during any physical activity.  SEEK IMMEDIATE MEDICAL CARE IF:  You have difficulty breathing.  You have moderate to large ketone levels. MAKE SURE YOU:  Understand these instructions.  Will watch your condition.  Will get help right away if  you are not doing well or get worse. Document Released: 09/29/2005 Document Revised: 06/23/2012 Document Reviewed: 04/27/2012 [employer] Patient Information 2014 [employer].  

## 2013-08-31 NOTE — Progress Notes (Signed)
Subjective:    Patient ID: Abigail Collins, female    DOB: 1951-07-18, 62 y.o.   MRN: [id number]  Diabetes She presents for her follow-up diabetic visit. She has type 2 diabetes mellitus. Her disease course has been stable. There are no hypoglycemic associated symptoms. Pertinent negatives for hypoglycemia include no dizziness, headaches or tremors. Pertinent negatives for diabetes include no blurred vision, no chest pain, no fatigue, no foot paresthesias, no foot ulcerations, no polydipsia, no polyphagia, no polyuria, no visual change, no weakness and no weight loss. There are no hypoglycemic complications. Symptoms are improving. There are no diabetic complications. Current diabetic treatment includes oral agent (triple therapy). She is compliant with treatment most of the time. Her weight is stable. She is following a generally healthy diet. Meal planning includes avoidance of concentrated sweets. She has not had a previous visit with a dietician. She participates in exercise intermittently. Her home blood glucose trend is decreasing steadily. Her breakfast blood glucose range is generally 130-140 mg/dl. Her lunch blood glucose range is generally 180-200 mg/dl. Her dinner blood glucose range is generally 180-200 mg/dl. Her highest blood glucose is >200 mg/dl. Her overall blood glucose range is 180-200 mg/dl. An ACE inhibitor/angiotensin II receptor blocker is being taken. She does not see a podiatrist.Eye exam is current.      Review of Systems  Constitutional: Negative.  Negative for fever, chills, weight loss, diaphoresis, appetite change and fatigue.  HENT: Negative.   Eyes: Negative.  Negative for blurred vision.  Respiratory: Negative.  Negative for cough, chest tightness, shortness of breath, wheezing and stridor.   Cardiovascular: Negative.  Negative for chest pain, palpitations and leg swelling.  Gastrointestinal: Negative.  Negative for nausea, vomiting, abdominal pain, diarrhea,  constipation and blood in stool.  Endocrine: Negative.  Negative for polydipsia, polyphagia and polyuria.  Genitourinary: Negative.   Musculoskeletal: Positive for arthralgias. Negative for back pain, gait problem, joint swelling, myalgias, neck pain and neck stiffness.  Skin: Negative.   Allergic/Immunologic: Negative.   Neurological: Negative.  Negative for dizziness, tremors, weakness, light-headedness and headaches.  Hematological: Negative.  Negative for adenopathy. Does not bruise/bleed easily.  Psychiatric/Behavioral: Negative.        Objective:   Physical Exam  Vitals reviewed. Constitutional: She is oriented to person, place, and time. She appears well-developed and well-nourished. No distress.  HENT:  Head: Normocephalic and atraumatic.  Mouth/Throat: Oropharynx is clear and moist. No oropharyngeal exudate.  Eyes: Conjunctivae are normal. Right eye exhibits no discharge. Left eye exhibits no discharge. No scleral icterus.  Neck: Normal range of motion. Neck supple. No JVD present. No tracheal deviation present. No thyromegaly present.  Cardiovascular: Normal rate, regular rhythm, normal heart sounds and intact distal pulses.  Exam reveals no gallop and no friction rub.   No murmur heard. Pulmonary/Chest: Effort normal and breath sounds normal. No stridor. No respiratory distress. She has no wheezes. She has no rales. She exhibits no tenderness.  Abdominal: Soft. Bowel sounds are normal. She exhibits no distension and no mass. There is no tenderness. There is no rebound and no guarding.  Musculoskeletal: Normal range of motion. She exhibits no edema and no tenderness.  Lymphadenopathy:    She has no cervical adenopathy.  Neurological: She is oriented to person, place, and time.  Skin: Skin is warm and dry. No rash noted. She is not diaphoretic. No erythema. No pallor.  Psychiatric: She has a normal mood and affect. Her behavior is normal. Judgment and thought content normal.  Lab Results  Component Value Date   WBC 9.4 08/03/2013   HGB 15.4* 08/03/2013   HCT 45.3 08/03/2013   PLT 195.0 08/03/2013   GLUCOSE 222* 08/03/2013   CHOL 165 08/03/2013   TRIG 296.0* 08/03/2013   HDL 59.40 08/03/2013   LDLDIRECT 75.4 08/03/2013   LDLCALC UNABLE TO CALCULATE IF TRIGLYCERIDE OVER 400 mg/dL 05/23/9146   ALT 41* 82/95/6213   AST 53* 08/03/2013   NA 141 08/03/2013   K 4.4 08/03/2013   CL 103 08/03/2013   CREATININE 0.7 08/03/2013   BUN 13 08/03/2013   CO2 27 08/03/2013   TSH 2.22 08/03/2013   INR 0.88 09/03/2012   HGBA1C 10.9* 08/03/2013   MICROALBUR 2.3* 08/03/2013       Assessment & Plan:

## 2013-10-27 ENCOUNTER — Encounter: Payer: Self-pay | Admitting: Internal Medicine

## 2013-10-28 ENCOUNTER — Telehealth: Payer: Self-pay | Admitting: *Deleted

## 2013-10-28 ENCOUNTER — Other Ambulatory Visit: Payer: Self-pay | Admitting: *Deleted

## 2013-10-28 NOTE — Telephone Encounter (Signed)
Patient phoned needing additional samples to last her until she came to see PCP in March for the following:  Azor 5-20 Livalo 2mg  Janumet XR 50-1000 Farxiga 5mg   Samples were found for all except azor.  PCP messaging drug rep requesting samples.  Patient planning to pick samples up during [facility] tomorrow morning.  Available samples in bin for her to p/u

## 2013-12-22 ENCOUNTER — Encounter: Payer: Self-pay | Admitting: Internal Medicine

## 2013-12-22 ENCOUNTER — Other Ambulatory Visit (INDEPENDENT_AMBULATORY_CARE_PROVIDER_SITE_OTHER): Payer: No Typology Code available for payment source

## 2013-12-22 ENCOUNTER — Ambulatory Visit (INDEPENDENT_AMBULATORY_CARE_PROVIDER_SITE_OTHER): Payer: No Typology Code available for payment source | Admitting: Internal Medicine

## 2013-12-22 VITALS — BP 140/94 | HR 80 | Temp 98.4°F | Resp 16 | Ht 64.5 in | Wt 231.1 lb

## 2013-12-22 DIAGNOSIS — E1149 Type 2 diabetes mellitus with other diabetic neurological complication: Secondary | ICD-10-CM

## 2013-12-22 DIAGNOSIS — H6690 Otitis media, unspecified, unspecified ear: Secondary | ICD-10-CM

## 2013-12-22 DIAGNOSIS — E781 Pure hyperglyceridemia: Secondary | ICD-10-CM

## 2013-12-22 DIAGNOSIS — H6692 Otitis media, unspecified, left ear: Secondary | ICD-10-CM

## 2013-12-22 DIAGNOSIS — IMO0002 Reserved for concepts with insufficient information to code with codable children: Secondary | ICD-10-CM

## 2013-12-22 DIAGNOSIS — I10 Essential (primary) hypertension: Secondary | ICD-10-CM

## 2013-12-22 DIAGNOSIS — E1165 Type 2 diabetes mellitus with hyperglycemia: Secondary | ICD-10-CM

## 2013-12-22 DIAGNOSIS — E785 Hyperlipidemia, unspecified: Secondary | ICD-10-CM

## 2013-12-22 DIAGNOSIS — B356 Tinea cruris: Secondary | ICD-10-CM

## 2013-12-22 DIAGNOSIS — N760 Acute vaginitis: Secondary | ICD-10-CM

## 2013-12-22 LAB — CBC WITH DIFFERENTIAL/PLATELET
Basophils Absolute: 0.1 10*3/uL (ref 0.0–0.1)
Basophils Relative: 0.6 % (ref 0.0–3.0)
Eosinophils Absolute: 0.1 10*3/uL (ref 0.0–0.7)
Eosinophils Relative: 0.7 % (ref 0.0–5.0)
HCT: 50.5 % — ABNORMAL HIGH (ref 36.0–46.0)
Hemoglobin: 16.9 g/dL — ABNORMAL HIGH (ref 12.0–15.0)
Lymphocytes Relative: 33 % (ref 12.0–46.0)
Lymphs Abs: 3.9 10*3/uL (ref 0.7–4.0)
MCHC: 33.4 g/dL (ref 30.0–36.0)
MCV: 93.9 fl (ref 78.0–100.0)
Monocytes Absolute: 0.5 10*3/uL (ref 0.1–1.0)
Monocytes Relative: 4.1 % (ref 3.0–12.0)
Neutro Abs: 7.2 10*3/uL (ref 1.4–7.7)
Neutrophils Relative %: 61.6 % (ref 43.0–77.0)
Platelets: 195 10*3/uL (ref 150.0–400.0)
RBC: 5.38 Mil/uL — ABNORMAL HIGH (ref 3.87–5.11)
RDW: 12.8 % (ref 11.5–14.6)
WBC: 11.7 10*3/uL — ABNORMAL HIGH (ref 4.5–10.5)

## 2013-12-22 LAB — URINALYSIS, ROUTINE W REFLEX MICROSCOPIC
Bilirubin Urine: NEGATIVE
Hgb urine dipstick: NEGATIVE
Ketones, ur: NEGATIVE
Leukocytes, UA: NEGATIVE
Nitrite: NEGATIVE
Specific Gravity, Urine: 1.015 (ref 1.000–1.030)
Total Protein, Urine: NEGATIVE
Urine Glucose: >=1000 — AB
Urobilinogen, UA: 0.2 (ref 0.0–1.0)
pH: 5.5 (ref 5.0–8.0)

## 2013-12-22 LAB — BASIC METABOLIC PANEL
BUN: 17 mg/dL (ref 6–23)
CO2: 27 mEq/L (ref 19–32)
Calcium: 10.4 mg/dL (ref 8.4–10.5)
Chloride: 106 mEq/L (ref 96–112)
Creatinine, Ser: 0.8 mg/dL (ref 0.4–1.2)
GFR: 78.43 mL/min (ref >60.00)
Glucose, Bld: 222 mg/dL — ABNORMAL HIGH (ref 70–99)
Potassium: 4.7 mEq/L (ref 3.5–5.1)
Sodium: 141 mEq/L (ref 135–145)

## 2013-12-22 LAB — BRAIN NATRIURETIC PEPTIDE: Pro B Natriuretic peptide (BNP): 19 pg/mL (ref 0.0–100.0)

## 2013-12-22 LAB — HEMOGLOBIN A1C: Hgb A1c MFr Bld: 10.3 % — ABNORMAL HIGH (ref 4.6–6.5)

## 2013-12-22 MED ORDER — AMLODIPINE-OLMESARTAN 5-20 MG PO TABS: 1.0000 | ORAL_TABLET | Freq: Every day | ORAL | Status: DC

## 2013-12-22 MED ORDER — PITAVASTATIN CALCIUM 2 MG PO TABS: 1.0000 | ORAL_TABLET | Freq: Every day | ORAL | Status: DC

## 2013-12-22 MED ORDER — INSULIN GLARGINE 100 UNIT/ML SOLOSTAR PEN: 30.0000 [IU] | PEN_INJECTOR | Freq: Every day | SUBCUTANEOUS | Status: DC

## 2013-12-22 MED ORDER — GLUCOSE BLOOD VI STRP: ORAL_STRIP | Status: DC

## 2013-12-22 MED ORDER — SITAGLIP PHOS-METFORMIN HCL ER 50-1000 MG PO TB24: 2.0000 | ORAL_TABLET | Freq: Every day | ORAL | Status: DC

## 2013-12-22 MED ORDER — DAPAGLIFLOZIN PROPANEDIOL 5 MG PO TABS: 1.0000 | ORAL_TABLET | Freq: Every day | ORAL | Status: DC

## 2013-12-22 MED ORDER — FREESTYLE FREEDOM LITE W/DEVICE KIT: 1.0000 | PACK | Freq: Three times a day (TID) | Status: DC

## 2013-12-22 NOTE — Assessment & Plan Note (Signed)
FLP today 

## 2013-12-22 NOTE — Assessment & Plan Note (Signed)
Her BP is not well controlled She has been out of azor for several weeks Will restart azor and check her lytes and renal function today

## 2013-12-22 NOTE — Assessment & Plan Note (Signed)
Her blood sugars are too high, she will cont the current meds and I have asked her to start lantus

## 2013-12-22 NOTE — Progress Notes (Signed)
Pre visit review using our clinic review tool, if applicable. No additional management support is needed unless otherwise documented below in the visit note. 

## 2013-12-22 NOTE — Progress Notes (Signed)
Subjective:    Patient ID: Abigail Collins, female    DOB: 10-Oct-1951, 63 y.o.   MRN: [id number]  Diabetes She presents for her follow-up diabetic visit. She has type 2 diabetes mellitus. Her disease course has been worsening. There are no hypoglycemic associated symptoms. Pertinent negatives for hypoglycemia include no dizziness. Associated symptoms include polydipsia, polyphagia and polyuria. Pertinent negatives for diabetes include no blurred vision, no chest pain, no fatigue, no foot paresthesias, no foot ulcerations, no visual change, no weakness and no weight loss. There are no hypoglycemic complications. Diabetic complications include autonomic neuropathy. Current diabetic treatment includes oral agent (triple therapy). She is compliant with treatment most of the time. She is following a generally healthy diet. Meal planning includes avoidance of concentrated sweets. She has not had a previous visit with a dietician. She participates in exercise intermittently. Her breakfast blood glucose range is generally >200 mg/dl. Her lunch blood glucose range is generally >200 mg/dl. Her dinner blood glucose range is generally >200 mg/dl. Her highest blood glucose is >200 mg/dl. Her overall blood glucose range is >200 mg/dl. An ACE inhibitor/angiotensin II receptor blocker is being taken. She does not see a podiatrist.Eye exam is current.      Review of Systems  Constitutional: Negative.  Negative for fever, chills, weight loss, diaphoresis, appetite change and fatigue.  HENT: Negative.   Eyes: Negative.  Negative for blurred vision.  Respiratory: Negative.  Negative for cough, choking, chest tightness, shortness of breath, wheezing and stridor.   Cardiovascular: Negative.  Negative for chest pain, palpitations and leg swelling.  Gastrointestinal: Negative.  Negative for nausea, vomiting, abdominal pain, diarrhea, constipation and blood in stool.  Endocrine: Positive for polydipsia, polyphagia and  polyuria.  Genitourinary: Negative.   Musculoskeletal: Negative.  Negative for arthralgias, back pain, gait problem, joint swelling, myalgias, neck pain and neck stiffness.  Skin: Negative.   Allergic/Immunologic: Negative.   Neurological: Negative.  Negative for dizziness and weakness.  Hematological: Negative.  Negative for adenopathy. Does not bruise/bleed easily.  Psychiatric/Behavioral: Negative.        Objective:   Physical Exam  Vitals reviewed. Constitutional: She is oriented to person, place, and time. She appears well-developed and well-nourished. No distress.  HENT:  Head: Normocephalic and atraumatic.  Mouth/Throat: Oropharynx is clear and moist. No oropharyngeal exudate.  Eyes: Conjunctivae are normal. Right eye exhibits no discharge. Left eye exhibits no discharge. No scleral icterus.  Neck: Normal range of motion. Neck supple. No JVD present. No tracheal deviation present. No thyromegaly present.  Cardiovascular: Normal rate, regular rhythm, normal heart sounds and intact distal pulses.  Exam reveals no gallop.   No murmur heard. Pulmonary/Chest: Effort normal and breath sounds normal. No stridor. No respiratory distress. She has no wheezes. She has no rales. She exhibits no tenderness.  Abdominal: Soft. Bowel sounds are normal. She exhibits no distension and no mass. There is no tenderness. There is no rebound and no guarding.  Musculoskeletal: Normal range of motion. She exhibits no edema and no tenderness.  Lymphadenopathy:    She has no cervical adenopathy.  Neurological: She is oriented to person, place, and time.  Skin: Skin is warm and dry. No rash noted. She is not diaphoretic. No erythema. No pallor.  Psychiatric: She has a normal mood and affect. Her behavior is normal. Judgment and thought content normal.     Lab Results  Component Value Date   WBC 9.4 08/03/2013   HGB 15.4* 08/03/2013   HCT 45.3 08/03/2013  PLT 195.0 08/03/2013   GLUCOSE 222*  08/03/2013   CHOL 165 08/03/2013   TRIG 296.0* 08/03/2013   HDL 59.40 08/03/2013   LDLDIRECT 75.4 08/03/2013   LDLCALC UNABLE TO CALCULATE IF TRIGLYCERIDE OVER 400 mg/dL 04/06/2012   ALT 41* 08/03/2013   AST 53* 08/03/2013   NA 141 08/03/2013   K 4.4 08/03/2013   CL 103 08/03/2013   CREATININE 0.7 08/03/2013   BUN 13 08/03/2013   CO2 27 08/03/2013   TSH 2.22 08/03/2013   INR 0.88 09/03/2012   HGBA1C 10.9* 08/03/2013   MICROALBUR 2.3* 08/03/2013       Assessment & Plan:

## 2013-12-22 NOTE — Patient Instructions (Signed)
Type 2 Diabetes Mellitus, Adult Type 2 diabetes mellitus, often simply referred to as type 2 diabetes, is a long-lasting (chronic) disease. In type 2 diabetes, the pancreas does not make enough insulin (a hormone), the cells are less responsive to the insulin that is made (insulin resistance), or both. Normally, insulin moves sugars from food into the tissue cells. The tissue cells use the sugars for energy. The lack of insulin or the lack of normal response to insulin causes excess sugars to build up in the blood instead of going into the tissue cells. As a result, high blood sugar (hyperglycemia) develops. The effect of high sugar (glucose) levels can cause many complications. Type 2 diabetes was also previously called adult-onset diabetes but it can occur at any age.  RISK FACTORS  A person is predisposed to developing type 2 diabetes if someone in the family has the disease and also has one or more of the following primary risk factors:  Overweight.  An inactive lifestyle.  A history of consistently eating high-calorie foods. Maintaining a normal weight and regular physical activity can reduce the chance of developing type 2 diabetes. SYMPTOMS  A person with type 2 diabetes may not show symptoms initially. The symptoms of type 2 diabetes appear slowly. The symptoms include:  Increased thirst (polydipsia).  Increased urination (polyuria).  Increased urination during the night (nocturia).  Weight loss. This weight loss may be rapid.  Frequent, recurring infections.  Tiredness (fatigue).  Weakness.  Vision changes, such as blurred vision.  Fruity smell to your breath.  Abdominal pain.  Nausea or vomiting.  Cuts or bruises which are slow to heal.  Tingling or numbness in the hands or feet. DIAGNOSIS Type 2 diabetes is frequently not diagnosed until complications of diabetes are present. Type 2 diabetes is diagnosed when symptoms or complications are present and when blood  glucose levels are increased. Your blood glucose level may be checked by one or more of the following blood tests:  A fasting blood glucose test. You will not be allowed to eat for at least 8 hours before a blood sample is taken.  A random blood glucose test. Your blood glucose is checked at any time of the day regardless of when you ate.  A hemoglobin A1c blood glucose test. A hemoglobin A1c test provides information about blood glucose control over the previous 3 months.  An oral glucose tolerance test (OGTT). Your blood glucose is measured after you have not eaten (fasted) for 2 hours and then after you drink a glucose-containing beverage. TREATMENT   You may need to take insulin or diabetes medicine daily to keep blood glucose levels in the desired range.  You will need to match insulin dosing with exercise and healthy food choices. The treatment goal is to maintain the before meal blood sugar (preprandial glucose) level at 70 130 mg/dL. HOME CARE INSTRUCTIONS   Have your hemoglobin A1c level checked twice a year.  Perform daily blood glucose monitoring as directed by your caregiver.  Monitor urine ketones when you are ill and as directed by your caregiver.  Take your diabetes medicine or insulin as directed by your caregiver to maintain your blood glucose levels in the desired range.  Never run out of diabetes medicine or insulin. It is needed every day.  Adjust insulin based on your intake of carbohydrates. Carbohydrates can raise blood glucose levels but need to be included in your diet. Carbohydrates provide vitamins, minerals, and fiber which are an essential part of   a healthy diet. Carbohydrates are found in fruits, vegetables, whole grains, dairy products, legumes, and foods containing added sugars.    Eat healthy foods. Alternate 3 meals with 3 snacks.  Lose weight if overweight.  Carry a medical alert card or wear your medical alert jewelry.  Carry a 15 gram  carbohydrate snack with you at all times to treat low blood glucose (hypoglycemia). Some examples of 15 gram carbohydrate snacks include:  Glucose tablets, 3 or 4   Glucose gel, 15 gram tube  Raisins, 2 tablespoons (24 grams)  Jelly beans, 6  Animal crackers, 8  Regular pop, 4 ounces (120 mL)  Gummy treats, 9  Recognize hypoglycemia. Hypoglycemia occurs with blood glucose levels of 70 mg/dL and below. The risk for hypoglycemia increases when fasting or skipping meals, during or after intense exercise, and during sleep. Hypoglycemia symptoms can include:  Tremors or shakes.  Decreased ability to concentrate.  Sweating.  Increased heart rate.  Headache.  Dry mouth.  Hunger.  Irritability.  Anxiety.  Restless sleep.  Altered speech or coordination.  Confusion.  Treat hypoglycemia promptly. If you are alert and able to safely swallow, follow the 15:15 rule:  Take 15 20 grams of rapid-acting glucose or carbohydrate. Rapid-acting options include glucose gel, glucose tablets, or 4 ounces (120 mL) of fruit juice, regular soda, or low fat milk.  Check your blood glucose level 15 minutes after taking the glucose.  Take 15 20 grams more of glucose if the repeat blood glucose level is still 70 mg/dL or below.  Eat a meal or snack within 1 hour once blood glucose levels return to normal.    Be alert to polyuria and polydipsia which are early signs of hyperglycemia. An early awareness of hyperglycemia allows for prompt treatment. Treat hyperglycemia as directed by your caregiver.  Engage in at least 150 minutes of moderate-intensity physical activity a week, spread over at least 3 days of the week or as directed by your caregiver. In addition, you should engage in resistance exercise at least 2 times a week or as directed by your caregiver.  Adjust your medicine and food intake as needed if you start a new exercise or sport.  Follow your sick day plan at any time you  are unable to eat or drink as usual.  Avoid tobacco use.  Limit alcohol intake to no more than 1 drink per day for nonpregnant women and 2 drinks per day for men. You should drink alcohol only when you are also eating food. Talk with your caregiver whether alcohol is safe for you. Tell your caregiver if you drink alcohol several times a week.  Follow up with your caregiver regularly.  Schedule an eye exam soon after the diagnosis of type 2 diabetes and then annually.  Perform daily skin and foot care. Examine your skin and feet daily for cuts, bruises, redness, nail problems, bleeding, blisters, or sores. A foot exam by a caregiver should be done annually.  Brush your teeth and gums at least twice a day and floss at least once a day. Follow up with your dentist regularly.  Share your diabetes management plan with your workplace or school.  Stay up-to-date with immunizations.  Learn to manage stress.  Obtain ongoing diabetes education and support as needed.  Participate in, or seek rehabilitation as needed to maintain or improve independence and quality of life. Request a physical or occupational therapy referral if you are having foot or hand numbness or difficulties with grooming,   dressing, eating, or physical activity. SEEK MEDICAL CARE IF:   You are unable to eat food or drink fluids for more than 6 hours.  You have nausea and vomiting for more than 6 hours.  Your blood glucose level is over 240 mg/dL.  There is a change in mental status.  You develop an additional serious illness.  You have diarrhea for more than 6 hours.  You have been sick or have had a fever for a couple of days and are not getting better.  You have pain during any physical activity.  SEEK IMMEDIATE MEDICAL CARE IF:  You have difficulty breathing.  You have moderate to large ketone levels. MAKE SURE YOU:  Understand these instructions.  Will watch your condition.  Will get help right away if  you are not doing well or get worse. Document Released: 09/29/2005 Document Revised: 06/23/2012 Document Reviewed: 04/27/2012 [employer] Patient Information 2014 [employer].  

## 2013-12-22 NOTE — Assessment & Plan Note (Signed)
She is doing well on livalo FLP today

## 2013-12-23 ENCOUNTER — Encounter: Payer: Self-pay | Admitting: Internal Medicine

## 2013-12-23 ENCOUNTER — Other Ambulatory Visit: Payer: Self-pay

## 2013-12-23 DIAGNOSIS — E1149 Type 2 diabetes mellitus with other diabetic neurological complication: Secondary | ICD-10-CM

## 2013-12-23 DIAGNOSIS — E1165 Type 2 diabetes mellitus with hyperglycemia: Secondary | ICD-10-CM

## 2013-12-23 DIAGNOSIS — IMO0002 Reserved for concepts with insufficient information to code with codable children: Secondary | ICD-10-CM

## 2013-12-23 MED ORDER — GLUCOSE BLOOD VI STRP: ORAL_STRIP | Status: DC

## 2013-12-23 NOTE — Telephone Encounter (Signed)
Pt needs script w/ directions for free style lite test strips insurance billing

## 2013-12-28 LAB — HM DIABETES EYE EXAM

## 2013-12-29 ENCOUNTER — Ambulatory Visit: Payer: No Typology Code available for payment source | Admitting: Internal Medicine

## 2014-01-11 ENCOUNTER — Other Ambulatory Visit: Payer: Self-pay | Admitting: *Deleted

## 2014-01-11 DIAGNOSIS — E1149 Type 2 diabetes mellitus with other diabetic neurological complication: Secondary | ICD-10-CM

## 2014-01-11 DIAGNOSIS — E1165 Type 2 diabetes mellitus with hyperglycemia: Secondary | ICD-10-CM

## 2014-01-11 DIAGNOSIS — IMO0002 Reserved for concepts with insufficient information to code with codable children: Secondary | ICD-10-CM

## 2014-01-11 MED ORDER — INSULIN GLARGINE 100 UNIT/ML SOLOSTAR PEN: 30.0000 [IU] | PEN_INJECTOR | Freq: Every day | SUBCUTANEOUS | Status: DC

## 2014-01-12 ENCOUNTER — Telehealth: Payer: Self-pay | Admitting: Internal Medicine

## 2014-01-12 NOTE — Telephone Encounter (Signed)
Insulin samples are kept in the refridgerator.  Staff aware.  Samples in fridge on side A since 4.1.15.

## 2014-01-12 NOTE — Telephone Encounter (Signed)
Patients husband came by to pick up samples but did not see anything in the cabinet.  Patient said she spoke with you.  Please advice.  Thanks!!

## 2014-02-13 ENCOUNTER — Encounter: Payer: Self-pay | Admitting: Internal Medicine

## 2014-02-13 ENCOUNTER — Telehealth: Payer: Self-pay | Admitting: *Deleted

## 2014-02-13 ENCOUNTER — Ambulatory Visit (INDEPENDENT_AMBULATORY_CARE_PROVIDER_SITE_OTHER): Payer: No Typology Code available for payment source | Admitting: Internal Medicine

## 2014-02-13 ENCOUNTER — Ambulatory Visit (INDEPENDENT_AMBULATORY_CARE_PROVIDER_SITE_OTHER)
Admission: RE | Admit: 2014-02-13 | Discharge: 2014-02-13 | Disposition: A | Discharge status: Home / Self Care | Payer: No Typology Code available for payment source | Source: Ambulatory Visit | Attending: Internal Medicine | Admitting: Internal Medicine

## 2014-02-13 VITALS — BP 118/78 | HR 116 | Temp 97.8°F | Resp 16 | Wt 235.0 lb

## 2014-02-13 DIAGNOSIS — IMO0002 Reserved for concepts with insufficient information to code with codable children: Secondary | ICD-10-CM

## 2014-02-13 DIAGNOSIS — R059 Cough, unspecified: Secondary | ICD-10-CM

## 2014-02-13 DIAGNOSIS — J189 Pneumonia, unspecified organism: Secondary | ICD-10-CM | POA: Insufficient documentation

## 2014-02-13 DIAGNOSIS — E1165 Type 2 diabetes mellitus with hyperglycemia: Secondary | ICD-10-CM

## 2014-02-13 DIAGNOSIS — R05 Cough: Secondary | ICD-10-CM

## 2014-02-13 DIAGNOSIS — E1149 Type 2 diabetes mellitus with other diabetic neurological complication: Secondary | ICD-10-CM

## 2014-02-13 LAB — GLUCOSE, POCT (MANUAL RESULT ENTRY): POC Glucose: 228 mg/dl — AB (ref 70–99)

## 2014-02-13 MED ORDER — HYDROCODONE-HOMATROPINE 5-1.5 MG/5ML PO SYRP: 5.0000 mL | ORAL_SOLUTION | Freq: Three times a day (TID) | ORAL | Status: DC | PRN

## 2014-02-13 MED ORDER — AZITHROMYCIN 500 MG PO TABS: 500.0000 mg | ORAL_TABLET | Freq: Every day | ORAL | Status: DC

## 2014-02-13 MED ORDER — INSULIN GLARGINE 100 UNIT/ML SOLOSTAR PEN: 50.0000 [IU] | PEN_INJECTOR | Freq: Every day | SUBCUTANEOUS | Status: DC

## 2014-02-13 NOTE — Progress Notes (Signed)
Subjective:    Patient ID: Abigail Collins, female    DOB: December 07, 1950, 63 y.o.   MRN: [id number]  Cough This is a new problem. The current episode started in the past 7 days. The problem has been gradually worsening. The problem occurs every few hours. The cough is non-productive. Associated symptoms include chills, a fever, postnasal drip, rhinorrhea, a sore throat and shortness of breath. Pertinent negatives include no chest pain, ear congestion, ear pain, headaches, heartburn, hemoptysis, myalgias, nasal congestion, rash, sweats, weight loss or wheezing. Nothing aggravates the symptoms. She has tried OTC cough suppressant (amoxil) for the symptoms. The treatment provided no relief. Her past medical history is significant for bronchitis and pneumonia. There is no history of asthma, bronchiectasis, COPD or emphysema.      Review of Systems  Constitutional: Positive for fever and chills. Negative for weight loss, diaphoresis, activity change, appetite change, fatigue and unexpected weight change.  HENT: Positive for postnasal drip, rhinorrhea and sore throat. Negative for ear pain.   Eyes: Negative.   Respiratory: Positive for cough and shortness of breath. Negative for apnea, hemoptysis, choking, chest tightness, wheezing and stridor.   Cardiovascular: Negative.  Negative for chest pain, palpitations and leg swelling.  Gastrointestinal: Negative.  Negative for heartburn, nausea, vomiting, abdominal pain, diarrhea, constipation and blood in stool.  Endocrine: Positive for polydipsia, polyphagia and polyuria.  Genitourinary: Negative.   Musculoskeletal: Positive for arthralgias and back pain. Negative for gait problem, joint swelling, myalgias, neck pain and neck stiffness.  Skin: Negative.  Negative for rash.  Allergic/Immunologic: Negative.   Neurological: Negative.  Negative for headaches.  Hematological: Negative.  Negative for adenopathy. Does not bruise/bleed easily.    Psychiatric/Behavioral: Negative.        Objective:   Physical Exam  Vitals reviewed. Constitutional: She is oriented to person, place, and time. She appears well-developed and well-nourished.  Non-toxic appearance. She does not have a sickly appearance. She does not appear ill. No distress.  HENT:  Head: Normocephalic and atraumatic.  Right Ear: Hearing, tympanic membrane, external ear and ear canal normal.  Left Ear: Hearing, tympanic membrane, external ear and ear canal normal.  Nose: No mucosal edema or rhinorrhea. Right sinus exhibits no maxillary sinus tenderness and no frontal sinus tenderness. Left sinus exhibits no maxillary sinus tenderness and no frontal sinus tenderness.  Mouth/Throat: Mucous membranes are normal. Mucous membranes are not pale, not dry and not cyanotic. No oral lesions. No trismus in the jaw. Posterior oropharyngeal erythema present. No oropharyngeal exudate, posterior oropharyngeal edema or tonsillar abscesses.  Eyes: Conjunctivae are normal. Right eye exhibits no discharge. Left eye exhibits no discharge. No scleral icterus.  Neck: Normal range of motion. Neck supple. No JVD present. No tracheal deviation present. No thyromegaly present.  Cardiovascular: Normal rate, regular rhythm, normal heart sounds and intact distal pulses.  Exam reveals no gallop and no friction rub.   No murmur heard. Pulmonary/Chest: Effort normal. No accessory muscle usage or stridor. Not tachypneic. No respiratory distress. She has no decreased breath sounds. She has no wheezes. She has rhonchi in the right middle field and the left middle field. She has no rales. She exhibits no tenderness.  Abdominal: Soft. Bowel sounds are normal. She exhibits no distension and no mass. There is no tenderness. There is no rebound and no guarding.  Musculoskeletal: Normal range of motion. She exhibits no edema and no tenderness.  Lymphadenopathy:    She has no cervical adenopathy.  Neurological: She  is  oriented to person, place, and time.  Skin: Skin is warm and dry. No rash noted. She is not diaphoretic. No erythema. No pallor.    Lab Results  Component Value Date   WBC 11.7* 12/22/2013   HGB 16.9* 12/22/2013   HCT 50.5* 12/22/2013   PLT 195.0 12/22/2013   GLUCOSE 222* 12/22/2013   CHOL 165 08/03/2013   TRIG 296.0* 08/03/2013   HDL 59.40 08/03/2013   LDLDIRECT 75.4 08/03/2013   LDLCALC UNABLE TO CALCULATE IF TRIGLYCERIDE OVER 400 mg/dL 04/06/2012   ALT 41* 08/03/2013   AST 53* 08/03/2013   NA 141 12/22/2013   K 4.7 12/22/2013   CL 106 12/22/2013   CREATININE 0.8 12/22/2013   BUN 17 12/22/2013   CO2 27 12/22/2013   TSH 2.22 08/03/2013   INR 0.88 09/03/2012   HGBA1C 10.3* 12/22/2013   MICROALBUR 2.3* 08/03/2013        Assessment & Plan:

## 2014-02-13 NOTE — Telephone Encounter (Signed)
Message copied by Chilton Greathouse on Mon Feb 13, 2014  4:26 PM ------      Message from: Janith Lima      Created: Mon Feb 13, 2014  4:15 PM       Please tell her that the chest xray was normal ------

## 2014-02-13 NOTE — Progress Notes (Signed)
Pre-visit discussion using our clinic review tool, as applicable. No additional management support is needed unless otherwise documented below in the visit note.  

## 2014-02-13 NOTE — Assessment & Plan Note (Signed)
Her blood sugars are not well controlled so I have asked her to increase her dose of lantus to 50 units per day

## 2014-02-13 NOTE — Patient Instructions (Signed)

## 2014-02-13 NOTE — Assessment & Plan Note (Signed)
I will check her CXR to see if she had PNA, mass, edema

## 2014-02-13 NOTE — Assessment & Plan Note (Signed)
Amoxil has not helped much so I have asked her to try a zpak and hycodan for the cough

## 2014-02-13 NOTE — Telephone Encounter (Signed)
Attempted to call patient regarding CXR results. No voice mail set up.

## 2014-02-16 ENCOUNTER — Telehealth: Payer: Self-pay | Admitting: *Deleted

## 2014-02-16 MED ORDER — FLUCONAZOLE 150 MG PO TABS: 150.0000 mg | ORAL_TABLET | Freq: Once | ORAL | Status: DC

## 2014-02-16 NOTE — Telephone Encounter (Signed)
Try diflucan

## 2014-02-16 NOTE — Telephone Encounter (Signed)
Pt called states z Pac has caused a yeast infection.  Please advise

## 2014-02-20 ENCOUNTER — Ambulatory Visit (INDEPENDENT_AMBULATORY_CARE_PROVIDER_SITE_OTHER): Payer: No Typology Code available for payment source | Admitting: Internal Medicine

## 2014-02-20 ENCOUNTER — Encounter: Payer: Self-pay | Admitting: Internal Medicine

## 2014-02-20 VITALS — BP 130/80 | HR 93 | Temp 97.9°F | Resp 16 | Ht 64.5 in | Wt 237.1 lb

## 2014-02-20 DIAGNOSIS — E1149 Type 2 diabetes mellitus with other diabetic neurological complication: Secondary | ICD-10-CM

## 2014-02-20 DIAGNOSIS — I10 Essential (primary) hypertension: Secondary | ICD-10-CM

## 2014-02-20 DIAGNOSIS — J039 Acute tonsillitis, unspecified: Secondary | ICD-10-CM

## 2014-02-20 DIAGNOSIS — IMO0002 Reserved for concepts with insufficient information to code with codable children: Secondary | ICD-10-CM

## 2014-02-20 DIAGNOSIS — E1165 Type 2 diabetes mellitus with hyperglycemia: Secondary | ICD-10-CM

## 2014-02-20 MED ORDER — CEFTIBUTEN 400 MG PO CAPS
400.0000 mg | ORAL_CAPSULE | Freq: Every day | ORAL | Status: AC
Start: 2014-02-20 — End: 2014-02-27

## 2014-02-20 MED ORDER — AMLODIPINE-OLMESARTAN 5-20 MG PO TABS: 1.0000 | ORAL_TABLET | Freq: Every day | ORAL | Status: DC

## 2014-02-20 NOTE — Telephone Encounter (Signed)
Left message on [facility] that Rx sent to pharmacy

## 2014-02-20 NOTE — Progress Notes (Signed)
Pre visit review using our clinic review tool, if applicable. No additional management support is needed unless otherwise documented below in the visit note. 

## 2014-02-20 NOTE — Patient Instructions (Signed)
Tonsillitis Tonsillitis is an infection of the throat that causes the tonsils to become red, tender, and swollen. Tonsils are collections of lymphoid tissue at the back of the throat. Each tonsil has crevices (crypts). Tonsils help fight nose and throat infections and keep infection from spreading to other parts of the body for the first 18 months of life.  CAUSES Sudden (acute) tonsillitis is usually caused by infection with streptococcal bacteria. Long-lasting (chronic) tonsillitis occurs when the crypts of the tonsils become filled with pieces of food and bacteria, which makes it easy for the tonsils to become repeatedly infected. SYMPTOMS  Symptoms of tonsillitis include:  A sore throat, with possible difficulty swallowing.  White patches on the tonsils.  Fever.  Tiredness.  New episodes of snoring during sleep, when you did not snore before.  Small, foul-smelling, yellowish-white pieces of material (tonsilloliths) that you occasionally cough up or spit out. The tonsilloliths can also cause you to have bad breath. DIAGNOSIS Tonsillitis can be diagnosed through a physical exam. Diagnosis can be confirmed with the results of lab tests, including a throat culture. TREATMENT  The goals of tonsillitis treatment include the reduction of the severity and duration of symptoms and prevention of associated conditions. Symptoms of tonsillitis can be improved with the use of steroids to reduce the swelling. Tonsillitis caused by bacteria can be treated with antibiotics. Usually, treatment with antibiotics is started before the cause of the tonsillitis is known. However, if it is determined that the cause is not bacterial, antibiotics will not treat the tonsillitis. If attacks of tonsillitis are severe and frequent, your caregiver may recommend surgery to remove the tonsils (tonsillectomy). HOME CARE INSTRUCTIONS   Rest as much as possible and get plenty of sleep.  Drink plenty of fluids. While the  throat is very sore, eat soft foods or liquids, such as sherbet, soups, or instant breakfast drinks.  Eat frozen ice pops.  Gargle with a warm or cold liquid to help soothe the throat. Mix 1/4 teaspoon of salt and 1/4 teaspoon of baking soda in in 8 oz of water. SEEK MEDICAL CARE IF:   Large, tender lumps develop in your neck.  A rash develops.  A green, yellow-brown, or bloody substance is coughed up.  You are unable to swallow liquids or food for 24 hours.  You notice that only one of the tonsils is swollen. SEEK IMMEDIATE MEDICAL CARE IF:   You develop any new symptoms such as vomiting, severe headache, stiff neck, chest pain, or trouble breathing or swallowing.  You have severe throat pain along with drooling or voice changes.  You have severe pain, unrelieved with recommended medications.  You are unable to fully open the mouth.  You develop redness, swelling, or severe pain anywhere in the neck.  You have a fever. MAKE SURE YOU:   Understand these instructions.  Will watch your condition.  Will get help right away if you are not doing well or get worse. Document Released: 07/09/2005 Document Revised: 06/01/2013 Document Reviewed: 03/18/2013 [employer] Patient Information 2014 [employer].  

## 2014-02-21 NOTE — Assessment & Plan Note (Signed)
She did not improve much with a Zpak Will try cedax this time IF she is not better soon then I will do a CT of her neck to look for mass

## 2014-02-21 NOTE — Progress Notes (Signed)
Subjective:    Patient ID: Abigail Collins, female    DOB: [birth year], 63 y.o.   MRN: [id number]  Sore Throat  This is a recurrent problem. The current episode started 1 to 4 weeks ago. The problem has been unchanged. Neither side of throat is experiencing more pain than the other. There has been no fever. The pain is at a severity of 1/10. The pain is mild. Associated symptoms include coughing. Pertinent negatives include no abdominal pain, congestion, diarrhea, drooling, ear discharge, ear pain, headaches, hoarse voice, plugged ear sensation, neck pain, shortness of breath, stridor, swollen glands, trouble swallowing or vomiting. She has had no exposure to strep or mono. She has tried oral narcotic analgesics for the symptoms. The treatment provided mild relief.      Review of Systems  Constitutional: Positive for chills. Negative for fever, diaphoresis, activity change, appetite change, fatigue and unexpected weight change.  HENT: Positive for sore throat. Negative for congestion, drooling, ear discharge, ear pain, hoarse voice, postnasal drip, rhinorrhea, sinus pressure, trouble swallowing and voice change.   Eyes: Negative.   Respiratory: Positive for cough. Negative for apnea, choking, chest tightness, shortness of breath, wheezing and stridor.   Cardiovascular: Negative.  Negative for chest pain, palpitations and leg swelling.  Gastrointestinal: Negative.  Negative for nausea, vomiting, abdominal pain, diarrhea, constipation and blood in stool.  Endocrine: Negative.   Genitourinary: Negative.   Musculoskeletal: Negative.  Negative for neck pain.  Skin: Negative.   Allergic/Immunologic: Negative.   Neurological: Negative.  Negative for headaches.  Hematological: Negative.  Negative for adenopathy. Does not bruise/bleed easily.  Psychiatric/Behavioral: Negative.        Objective:   Physical Exam  Vitals reviewed. Constitutional: She is oriented to person, place, and time. She  appears well-developed and well-nourished.  Non-toxic appearance. She does not have a sickly appearance. She does not appear ill. No distress.  HENT:  Head: Normocephalic and atraumatic.  Mouth/Throat: Uvula is midline. Mucous membranes are not pale, not dry and not cyanotic. Posterior oropharyngeal erythema present. No oropharyngeal exudate, posterior oropharyngeal edema or tonsillar abscesses.  Eyes: Conjunctivae are normal. Right eye exhibits no discharge. Left eye exhibits no discharge. No scleral icterus.  Neck: Normal range of motion. Neck supple. No JVD present. No tracheal deviation present. No thyromegaly present.  Cardiovascular: Normal rate, regular rhythm, normal heart sounds and intact distal pulses.  Exam reveals no gallop and no friction rub.   No murmur heard. Pulmonary/Chest: Effort normal and breath sounds normal. No stridor. No respiratory distress. She has no wheezes. She has no rales. She exhibits no tenderness.  Abdominal: Soft. Bowel sounds are normal. She exhibits no distension and no mass. There is no tenderness. There is no rebound and no guarding.  Musculoskeletal: Normal range of motion. She exhibits no edema and no tenderness.  Lymphadenopathy:    She has no cervical adenopathy.  Neurological: She is oriented to person, place, and time.  Skin: Skin is warm and dry. No rash noted. She is not diaphoretic. No erythema. No pallor.      Lab Results  Component Value Date   WBC 11.7* 12/22/2013   HGB 16.9* 12/22/2013   HCT 50.5* 12/22/2013   PLT 195.0 12/22/2013   GLUCOSE 222* 12/22/2013   CHOL 165 08/03/2013   TRIG 296.0* 08/03/2013   HDL 59.40 08/03/2013   LDLDIRECT 75.4 08/03/2013   LDLCALC UNABLE TO CALCULATE IF TRIGLYCERIDE OVER 400 mg/dL 04/06/2012   ALT 41* 08/03/2013   AST  53* 08/03/2013   NA 141 12/22/2013   K 4.7 12/22/2013   CL 106 12/22/2013   CREATININE 0.8 12/22/2013   BUN 17 12/22/2013   CO2 27 12/22/2013   TSH 2.22 08/03/2013   INR 0.88 09/03/2012    HGBA1C 10.3* 12/22/2013   MICROALBUR 2.3* 08/03/2013      Assessment & Plan:

## 2014-03-15 ENCOUNTER — Telehealth: Payer: Self-pay | Admitting: Internal Medicine

## 2014-03-15 ENCOUNTER — Encounter: Payer: Self-pay | Admitting: Internal Medicine

## 2014-03-15 ENCOUNTER — Ambulatory Visit (INDEPENDENT_AMBULATORY_CARE_PROVIDER_SITE_OTHER): Payer: No Typology Code available for payment source | Admitting: Internal Medicine

## 2014-03-15 VITALS — BP 108/72 | HR 80 | Temp 97.8°F | Resp 16 | Ht 64.5 in | Wt 236.0 lb

## 2014-03-15 DIAGNOSIS — E1165 Type 2 diabetes mellitus with hyperglycemia: Secondary | ICD-10-CM

## 2014-03-15 DIAGNOSIS — I10 Essential (primary) hypertension: Secondary | ICD-10-CM

## 2014-03-15 DIAGNOSIS — J309 Allergic rhinitis, unspecified: Secondary | ICD-10-CM | POA: Insufficient documentation

## 2014-03-15 DIAGNOSIS — E1149 Type 2 diabetes mellitus with other diabetic neurological complication: Secondary | ICD-10-CM

## 2014-03-15 DIAGNOSIS — IMO0002 Reserved for concepts with insufficient information to code with codable children: Secondary | ICD-10-CM

## 2014-03-15 MED ORDER — CETIRIZINE HCL 10 MG PO TABS: 10.0000 mg | ORAL_TABLET | Freq: Every day | ORAL | Status: DC

## 2014-03-15 MED ORDER — LOSARTAN POTASSIUM 100 MG PO TABS: 100.0000 mg | ORAL_TABLET | Freq: Every day | ORAL | Status: DC

## 2014-03-15 MED ORDER — INSULIN DETEMIR 100 UNIT/ML FLEXPEN: 50.0000 [IU] | PEN_INJECTOR | Freq: Every day | SUBCUTANEOUS | Status: DC

## 2014-03-15 MED ORDER — MOMETASONE FUROATE 50 MCG/ACT NA SUSP: 4.0000 | Freq: Every day | NASAL | Status: DC

## 2014-03-15 NOTE — Telephone Encounter (Signed)
Relevant patient education mailed to patient.  

## 2014-03-15 NOTE — Progress Notes (Signed)
Pre visit review using our clinic review tool, if applicable. No additional management support is needed unless otherwise documented below in the visit note. 

## 2014-03-15 NOTE — Progress Notes (Signed)
   Subjective:    Patient ID: Abigail Collins, female    DOB: 1951-03-03, 63 y.o.   MRN: [id number]  HPI Comments: She returns c/o a runny nose, post-nasal drip, sneezing.     Review of Systems  Constitutional: Negative.  Negative for fever, chills, diaphoresis, appetite change and fatigue.  HENT: Positive for postnasal drip, rhinorrhea and sneezing. Negative for congestion, ear pain, facial swelling, hearing loss, nosebleeds, sinus pressure, sore throat, trouble swallowing and voice change.   Eyes: Negative.  Negative for visual disturbance.  Respiratory: Negative.  Negative for apnea, cough, choking, chest tightness, shortness of breath, wheezing and stridor.   Cardiovascular: Negative.  Negative for chest pain, palpitations and leg swelling.  Gastrointestinal: Negative.  Negative for nausea, vomiting, abdominal pain, diarrhea, constipation and blood in stool.  Endocrine: Negative.  Negative for polydipsia, polyphagia and polyuria.  Genitourinary: Negative.   Musculoskeletal: Negative.  Negative for arthralgias, back pain, gait problem, myalgias and neck pain.  Skin: Negative.  Negative for rash.  Allergic/Immunologic: Negative.   Neurological: Negative.  Negative for dizziness, tremors, syncope, facial asymmetry, weakness, light-headedness and numbness.  Hematological: Negative.  Negative for adenopathy. Does not bruise/bleed easily.  Psychiatric/Behavioral: Negative.        Objective:   Physical Exam  Vitals reviewed. Constitutional: She is oriented to person, place, and time. She appears well-developed and well-nourished.  Non-toxic appearance. She does not have a sickly appearance. She does not appear ill. No distress.  HENT:  Head: Normocephalic and atraumatic.  Right Ear: Hearing, tympanic membrane, external ear and ear canal normal.  Left Ear: Hearing, tympanic membrane, external ear and ear canal normal.  Nose: Mucosal edema and rhinorrhea present. No nose lacerations,  sinus tenderness, nasal deformity, septal deviation or nasal septal hematoma. No epistaxis.  No foreign bodies. Right sinus exhibits no maxillary sinus tenderness and no frontal sinus tenderness. Left sinus exhibits no maxillary sinus tenderness and no frontal sinus tenderness.  Mouth/Throat: Oropharynx is clear and moist and mucous membranes are normal. Mucous membranes are not pale, not dry and not cyanotic. No oral lesions. No trismus in the jaw. No uvula swelling. No oropharyngeal exudate, posterior oropharyngeal edema, posterior oropharyngeal erythema or tonsillar abscesses.  Eyes: Conjunctivae are normal. Right eye exhibits no discharge. Left eye exhibits no discharge. No scleral icterus.  Neck: Normal range of motion. Neck supple. No JVD present. No tracheal deviation present. No thyromegaly present.  Cardiovascular: Normal rate, regular rhythm, normal heart sounds and intact distal pulses.  Exam reveals no gallop and no friction rub.   No murmur heard. Pulmonary/Chest: Effort normal and breath sounds normal. No stridor. No respiratory distress. She has no wheezes. She has no rales.  Abdominal: Soft. Bowel sounds are normal. She exhibits no distension and no mass. There is no tenderness. There is no rebound and no guarding.  Musculoskeletal: Normal range of motion. She exhibits no edema and no tenderness.  Lymphadenopathy:    She has no cervical adenopathy.  Neurological: She is oriented to person, place, and time.  Skin: Skin is warm and dry. No rash noted. She is not diaphoretic. No erythema. No pallor.  Psychiatric: She has a normal mood and affect. Her behavior is normal. Judgment and thought content normal.          Assessment & Plan:

## 2014-03-15 NOTE — Patient Instructions (Signed)
Type 2 Diabetes Mellitus, Adult Type 2 diabetes mellitus, often simply referred to as type 2 diabetes, is a long-lasting (chronic) disease. In type 2 diabetes, the pancreas does not make enough insulin (a hormone), the cells are less responsive to the insulin that is made (insulin resistance), or both. Normally, insulin moves sugars from food into the tissue cells. The tissue cells use the sugars for energy. The lack of insulin or the lack of normal response to insulin causes excess sugars to build up in the blood instead of going into the tissue cells. As a result, high blood sugar (hyperglycemia) develops. The effect of high sugar (glucose) levels can cause many complications. Type 2 diabetes was also previously called adult-onset diabetes but it can occur at any age.  RISK FACTORS  A person is predisposed to developing type 2 diabetes if someone in the family has the disease and also has one or more of the following primary risk factors:  Overweight.  An inactive lifestyle.  A history of consistently eating high-calorie foods. Maintaining a normal weight and regular physical activity can reduce the chance of developing type 2 diabetes. SYMPTOMS  A person with type 2 diabetes may not show symptoms initially. The symptoms of type 2 diabetes appear slowly. The symptoms include:  Increased thirst (polydipsia).  Increased urination (polyuria).  Increased urination during the night (nocturia).  Weight loss. This weight loss may be rapid.  Frequent, recurring infections.  Tiredness (fatigue).  Weakness.  Vision changes, such as blurred vision.  Fruity smell to your breath.  Abdominal pain.  Nausea or vomiting.  Cuts or bruises which are slow to heal.  Tingling or numbness in the hands or feet. DIAGNOSIS Type 2 diabetes is frequently not diagnosed until complications of diabetes are present. Type 2 diabetes is diagnosed when symptoms or complications are present and when blood  glucose levels are increased. Your blood glucose level may be checked by one or more of the following blood tests:  A fasting blood glucose test. You will not be allowed to eat for at least 8 hours before a blood sample is taken.  A random blood glucose test. Your blood glucose is checked at any time of the day regardless of when you ate.  A hemoglobin A1c blood glucose test. A hemoglobin A1c test provides information about blood glucose control over the previous 3 months.  An oral glucose tolerance test (OGTT). Your blood glucose is measured after you have not eaten (fasted) for 2 hours and then after you drink a glucose-containing beverage. TREATMENT   You may need to take insulin or diabetes medicine daily to keep blood glucose levels in the desired range.  You will need to match insulin dosing with exercise and healthy food choices. The treatment goal is to maintain the before meal blood sugar (preprandial glucose) level at 70 130 mg/dL. HOME CARE INSTRUCTIONS   Have your hemoglobin A1c level checked twice a year.  Perform daily blood glucose monitoring as directed by your caregiver.  Monitor urine ketones when you are ill and as directed by your caregiver.  Take your diabetes medicine or insulin as directed by your caregiver to maintain your blood glucose levels in the desired range.  Never run out of diabetes medicine or insulin. It is needed every day.  Adjust insulin based on your intake of carbohydrates. Carbohydrates can raise blood glucose levels but need to be included in your diet. Carbohydrates provide vitamins, minerals, and fiber which are an essential part of   a healthy diet. Carbohydrates are found in fruits, vegetables, whole grains, dairy products, legumes, and foods containing added sugars.    Eat healthy foods. Alternate 3 meals with 3 snacks.  Lose weight if overweight.  Carry a medical alert card or wear your medical alert jewelry.  Carry a 15 gram  carbohydrate snack with you at all times to treat low blood glucose (hypoglycemia). Some examples of 15 gram carbohydrate snacks include:  Glucose tablets, 3 or 4   Glucose gel, 15 gram tube  Raisins, 2 tablespoons (24 grams)  Jelly beans, 6  Animal crackers, 8  Regular pop, 4 ounces (120 mL)  Gummy treats, 9  Recognize hypoglycemia. Hypoglycemia occurs with blood glucose levels of 70 mg/dL and below. The risk for hypoglycemia increases when fasting or skipping meals, during or after intense exercise, and during sleep. Hypoglycemia symptoms can include:  Tremors or shakes.  Decreased ability to concentrate.  Sweating.  Increased heart rate.  Headache.  Dry mouth.  Hunger.  Irritability.  Anxiety.  Restless sleep.  Altered speech or coordination.  Confusion.  Treat hypoglycemia promptly. If you are alert and able to safely swallow, follow the 15:15 rule:  Take 15 20 grams of rapid-acting glucose or carbohydrate. Rapid-acting options include glucose gel, glucose tablets, or 4 ounces (120 mL) of fruit juice, regular soda, or low fat milk.  Check your blood glucose level 15 minutes after taking the glucose.  Take 15 20 grams more of glucose if the repeat blood glucose level is still 70 mg/dL or below.  Eat a meal or snack within 1 hour once blood glucose levels return to normal.    Be alert to polyuria and polydipsia which are early signs of hyperglycemia. An early awareness of hyperglycemia allows for prompt treatment. Treat hyperglycemia as directed by your caregiver.  Engage in at least 150 minutes of moderate-intensity physical activity a week, spread over at least 3 days of the week or as directed by your caregiver. In addition, you should engage in resistance exercise at least 2 times a week or as directed by your caregiver.  Adjust your medicine and food intake as needed if you start a new exercise or sport.  Follow your sick day plan at any time you  are unable to eat or drink as usual.  Avoid tobacco use.  Limit alcohol intake to no more than 1 drink per day for nonpregnant women and 2 drinks per day for men. You should drink alcohol only when you are also eating food. Talk with your caregiver whether alcohol is safe for you. Tell your caregiver if you drink alcohol several times a week.  Follow up with your caregiver regularly.  Schedule an eye exam soon after the diagnosis of type 2 diabetes and then annually.  Perform daily skin and foot care. Examine your skin and feet daily for cuts, bruises, redness, nail problems, bleeding, blisters, or sores. A foot exam by a caregiver should be done annually.  Brush your teeth and gums at least twice a day and floss at least once a day. Follow up with your dentist regularly.  Share your diabetes management plan with your workplace or school.  Stay up-to-date with immunizations.  Learn to manage stress.  Obtain ongoing diabetes education and support as needed.  Participate in, or seek rehabilitation as needed to maintain or improve independence and quality of life. Request a physical or occupational therapy referral if you are having foot or hand numbness or difficulties with grooming,   dressing, eating, or physical activity. SEEK MEDICAL CARE IF:   You are unable to eat food or drink fluids for more than 6 hours.  You have nausea and vomiting for more than 6 hours.  Your blood glucose level is over 240 mg/dL.  There is a change in mental status.  You develop an additional serious illness.  You have diarrhea for more than 6 hours.  You have been sick or have had a fever for a couple of days and are not getting better.  You have pain during any physical activity.  SEEK IMMEDIATE MEDICAL CARE IF:  You have difficulty breathing.  You have moderate to large ketone levels. MAKE SURE YOU:  Understand these instructions.  Will watch your condition.  Will get help right away if  you are not doing well or get worse. Document Released: 09/29/2005 Document Revised: 06/23/2012 Document Reviewed: 04/27/2012 [employer] Patient Information 2014 [employer].  

## 2014-03-17 NOTE — Assessment & Plan Note (Signed)
I have asked her to start treating this zyrtec and nasonex ns

## 2014-03-17 NOTE — Assessment & Plan Note (Signed)
Her BP is well controlled 

## 2014-03-17 NOTE — Assessment & Plan Note (Signed)
I changed lantus to levemir for better insurance coverage

## 2014-03-23 ENCOUNTER — Telehealth: Payer: Self-pay

## 2014-03-23 NOTE — Telephone Encounter (Signed)
Relevant patient education mailed to patient.  

## 2014-04-21 ENCOUNTER — Encounter: Payer: Self-pay | Admitting: Internal Medicine

## 2014-04-21 ENCOUNTER — Ambulatory Visit (INDEPENDENT_AMBULATORY_CARE_PROVIDER_SITE_OTHER): Payer: No Typology Code available for payment source | Admitting: Internal Medicine

## 2014-04-21 ENCOUNTER — Other Ambulatory Visit (INDEPENDENT_AMBULATORY_CARE_PROVIDER_SITE_OTHER): Payer: No Typology Code available for payment source

## 2014-04-21 VITALS — BP 122/80 | HR 93 | Temp 97.7°F | Resp 16 | Ht 64.5 in | Wt 237.4 lb

## 2014-04-21 DIAGNOSIS — IMO0002 Reserved for concepts with insufficient information to code with codable children: Secondary | ICD-10-CM

## 2014-04-21 DIAGNOSIS — E1165 Type 2 diabetes mellitus with hyperglycemia: Secondary | ICD-10-CM

## 2014-04-21 DIAGNOSIS — E1149 Type 2 diabetes mellitus with other diabetic neurological complication: Secondary | ICD-10-CM

## 2014-04-21 DIAGNOSIS — I10 Essential (primary) hypertension: Secondary | ICD-10-CM

## 2014-04-21 DIAGNOSIS — E785 Hyperlipidemia, unspecified: Secondary | ICD-10-CM

## 2014-04-21 DIAGNOSIS — E781 Pure hyperglyceridemia: Secondary | ICD-10-CM

## 2014-04-21 LAB — CBC WITH DIFFERENTIAL/PLATELET
Basophils Absolute: 0.1 10*3/uL (ref 0.0–0.1)
Basophils Relative: 0.6 % (ref 0.0–3.0)
Eosinophils Absolute: 0.2 10*3/uL (ref 0.0–0.7)
Eosinophils Relative: 1.5 % (ref 0.0–5.0)
HCT: 47.4 % — ABNORMAL HIGH (ref 36.0–46.0)
Hemoglobin: 15.9 g/dL — ABNORMAL HIGH (ref 12.0–15.0)
Lymphocytes Relative: 35.1 % (ref 12.0–46.0)
Lymphs Abs: 4.1 10*3/uL — ABNORMAL HIGH (ref 0.7–4.0)
MCHC: 33.6 g/dL (ref 30.0–36.0)
MCV: 92.3 fl (ref 78.0–100.0)
Monocytes Absolute: 0.6 10*3/uL (ref 0.1–1.0)
Monocytes Relative: 4.9 % (ref 3.0–12.0)
Neutro Abs: 6.8 10*3/uL (ref 1.4–7.7)
Neutrophils Relative %: 57.9 % (ref 43.0–77.0)
Platelets: 213 10*3/uL (ref 150.0–400.0)
RBC: 5.14 Mil/uL — ABNORMAL HIGH (ref 3.87–5.11)
RDW: 13.6 % (ref 11.5–15.5)
WBC: 11.7 10*3/uL — ABNORMAL HIGH (ref 4.0–10.5)

## 2014-04-21 LAB — COMPREHENSIVE METABOLIC PANEL
ALT: 26 U/L (ref 0–35)
AST: 32 U/L (ref 0–37)
Albumin: 4.3 g/dL (ref 3.5–5.2)
Alkaline Phosphatase: 79 U/L (ref 39–117)
BUN: 17 mg/dL (ref 6–23)
CO2: 25 mEq/L (ref 19–32)
Calcium: 10.3 mg/dL (ref 8.4–10.5)
Chloride: 102 mEq/L (ref 96–112)
Creatinine, Ser: 0.8 mg/dL (ref 0.4–1.2)
GFR: 72.98 mL/min (ref >60.00)
Glucose, Bld: 180 mg/dL — ABNORMAL HIGH (ref 70–99)
Potassium: 4.4 mEq/L (ref 3.5–5.1)
Sodium: 137 mEq/L (ref 135–145)
Total Bilirubin: 0.6 mg/dL (ref 0.2–1.2)
Total Protein: 7.7 g/dL (ref 6.0–8.3)

## 2014-04-21 LAB — LIPID PANEL
Cholesterol: 191 mg/dL (ref 0–200)
HDL: 51.5 mg/dL (ref >39.00)
NonHDL: 139.5
Total CHOL/HDL Ratio: 4
Triglycerides: 663 mg/dL — ABNORMAL HIGH (ref 0.0–149.0)
VLDL: 132.6 mg/dL — ABNORMAL HIGH (ref 0.0–40.0)

## 2014-04-21 LAB — HEMOGLOBIN A1C: Hgb A1c MFr Bld: 8.1 % — ABNORMAL HIGH (ref 4.6–6.5)

## 2014-04-21 NOTE — Progress Notes (Signed)
Pre visit review using our clinic review tool, if applicable. No additional management support is needed unless otherwise documented below in the visit note. 

## 2014-04-21 NOTE — Progress Notes (Signed)
Subjective:    Patient ID: Abigail Collins, female    DOB: [birth year], 63 y.o.   MRN: [id number]  Diabetes She presents for her follow-up diabetic visit. She has type 2 diabetes mellitus. Her disease course has been fluctuating. There are no hypoglycemic associated symptoms. Pertinent negatives for hypoglycemia include no dizziness, headaches or speech difficulty. Pertinent negatives for diabetes include no blurred vision, no chest pain, no fatigue, no foot paresthesias, no foot ulcerations, no polydipsia, no polyphagia, no polyuria, no visual change, no weakness and no weight loss. There are no hypoglycemic complications. Diabetic complications include peripheral neuropathy. Current diabetic treatment includes oral agent (triple therapy). She is compliant with treatment some of the time. Her weight is stable. She is following a generally unhealthy diet. When asked about meal planning, she reported none. She never participates in exercise. Her home blood glucose trend is fluctuating minimally. Her breakfast blood glucose range is generally >200 mg/dl. Her lunch blood glucose range is generally >200 mg/dl. Her dinner blood glucose range is generally >200 mg/dl. Her highest blood glucose is >200 mg/dl. Her overall blood glucose range is >200 mg/dl. An ACE inhibitor/angiotensin II receptor blocker is being taken. She does not see a podiatrist.Eye exam is current.      Review of Systems  Constitutional: Negative.  Negative for fever, chills, weight loss, diaphoresis, appetite change and fatigue.  HENT: Negative.   Eyes: Negative.  Negative for blurred vision.  Respiratory: Negative.  Negative for cough, choking, chest tightness, shortness of breath and stridor.   Cardiovascular: Negative.  Negative for chest pain, palpitations and leg swelling.  Gastrointestinal: Negative.  Negative for nausea, vomiting, abdominal pain, diarrhea, constipation and blood in stool.  Endocrine: Negative.  Negative for  polydipsia, polyphagia and polyuria.  Genitourinary: Negative.   Musculoskeletal: Negative.  Negative for arthralgias, back pain, myalgias and neck pain.  Skin: Negative.  Negative for rash.  Allergic/Immunologic: Negative.   Neurological: Negative.  Negative for dizziness, syncope, speech difficulty, weakness, light-headedness, numbness and headaches.  Hematological: Negative.  Negative for adenopathy. Does not bruise/bleed easily.       Objective:   Physical Exam  Vitals reviewed. Constitutional: She is oriented to person, place, and time. She appears well-developed and well-nourished. No distress.  HENT:  Head: Normocephalic and atraumatic.  Mouth/Throat: Oropharynx is clear and moist. No oropharyngeal exudate.  Eyes: Conjunctivae are normal. Right eye exhibits no discharge. Left eye exhibits no discharge. No scleral icterus.  Neck: Normal range of motion. Neck supple. No JVD present. No tracheal deviation present. No thyromegaly present.  Cardiovascular: Normal rate, regular rhythm, normal heart sounds and intact distal pulses.  Exam reveals no gallop and no friction rub.   No murmur heard. Pulmonary/Chest: Effort normal and breath sounds normal. No stridor. No respiratory distress. She has no wheezes. She has no rales. She exhibits no tenderness.  Abdominal: Soft. Bowel sounds are normal. She exhibits no distension and no mass. There is no tenderness. There is no rebound and no guarding.  Musculoskeletal: Normal range of motion. She exhibits no edema and no tenderness.  Lymphadenopathy:    She has no cervical adenopathy.  Neurological: She is oriented to person, place, and time.  Skin: Skin is warm and dry. No rash noted. She is not diaphoretic. No erythema. No pallor.  Psychiatric: She has a normal mood and affect. Her behavior is normal. Judgment and thought content normal.     Lab Results  Component Value Date   WBC 11.7* 12/22/2013  HGB 16.9* 12/22/2013   HCT 50.5*  12/22/2013   PLT 195.0 12/22/2013   GLUCOSE 222* 12/22/2013   CHOL 165 08/03/2013   TRIG 296.0* 08/03/2013   HDL 59.40 08/03/2013   LDLDIRECT 75.4 08/03/2013   LDLCALC UNABLE TO CALCULATE IF TRIGLYCERIDE OVER 400 mg/dL 04/06/2012   ALT 41* 08/03/2013   AST 53* 08/03/2013   NA 141 12/22/2013   K 4.7 12/22/2013   CL 106 12/22/2013   CREATININE 0.8 12/22/2013   BUN 17 12/22/2013   CO2 27 12/22/2013   TSH 2.22 08/03/2013   INR 0.88 09/03/2012   HGBA1C 10.3* 12/22/2013   MICROALBUR 2.3* 08/03/2013       Assessment & Plan:

## 2014-04-21 NOTE — Patient Instructions (Signed)
Type 2 Diabetes Mellitus, Adult Type 2 diabetes mellitus, often simply referred to as type 2 diabetes, is a long-lasting (chronic) disease. In type 2 diabetes, the pancreas does not make enough insulin (a hormone), the cells are less responsive to the insulin that is made (insulin resistance), or both. Normally, insulin moves sugars from food into the tissue cells. The tissue cells use the sugars for energy. The lack of insulin or the lack of normal response to insulin causes excess sugars to build up in the blood instead of going into the tissue cells. As a result, high blood sugar (hyperglycemia) develops. The effect of high sugar (glucose) levels can cause many complications. Type 2 diabetes was also previously called adult-onset diabetes but it can occur at any age.  RISK FACTORS  A person is predisposed to developing type 2 diabetes if someone in the family has the disease and also has one or more of the following primary risk factors:  Overweight.  An inactive lifestyle.  A history of consistently eating high-calorie foods. Maintaining a normal weight and regular physical activity can reduce the chance of developing type 2 diabetes. SYMPTOMS  A person with type 2 diabetes may not show symptoms initially. The symptoms of type 2 diabetes appear slowly. The symptoms include:  Increased thirst (polydipsia).  Increased urination (polyuria).  Increased urination during the night (nocturia).  Weight loss. This weight loss may be rapid.  Frequent, recurring infections.  Tiredness (fatigue).  Weakness.  Vision changes, such as blurred vision.  Fruity smell to your breath.  Abdominal pain.  Nausea or vomiting.  Cuts or bruises which are slow to heal.  Tingling or numbness in the hands or feet. DIAGNOSIS Type 2 diabetes is frequently not diagnosed until complications of diabetes are present. Type 2 diabetes is diagnosed when symptoms or complications are present and when blood  glucose levels are increased. Your blood glucose level may be checked by one or more of the following blood tests:  A fasting blood glucose test. You will not be allowed to eat for at least 8 hours before a blood sample is taken.  A random blood glucose test. Your blood glucose is checked at any time of the day regardless of when you ate.  A hemoglobin A1c blood glucose test. A hemoglobin A1c test provides information about blood glucose control over the previous 3 months.  An oral glucose tolerance test (OGTT). Your blood glucose is measured after you have not eaten (fasted) for 2 hours and then after you drink a glucose-containing beverage. TREATMENT   You may need to take insulin or diabetes medicine daily to keep blood glucose levels in the desired range.  If you use insulin, you may need to adjust the dosage depending on the carbohydrates that you eat with each meal or snack. The treatment goal is to maintain the before meal blood sugar (preprandial glucose) level at 70-130 mg/dL. HOME CARE INSTRUCTIONS   Have your hemoglobin A1c level checked twice a year.  Perform daily blood glucose monitoring as directed by your health care provider.  Monitor urine ketones when you are ill and as directed by your health care provider.  Take your diabetes medicine or insulin as directed by your health care provider to maintain your blood glucose levels in the desired range.  Never run out of diabetes medicine or insulin. It is needed every day.  If you are using insulin, you may need to adjust the amount of insulin given based on your intake   of carbohydrates. Carbohydrates can raise blood glucose levels but need to be included in your diet. Carbohydrates provide vitamins, minerals, and fiber which are an essential part of a healthy diet. Carbohydrates are found in fruits, vegetables, whole grains, dairy products, legumes, and foods containing added sugars.  Eat healthy foods. You should make an  appointment to see a registered dietitian to help you create an eating plan that is right for you.  Lose weight if overweight.  Carry a medical alert card or wear your medical alert jewelry.  Carry a 15 gram carbohydrate snack with you at all times to treat low blood glucose (hypoglycemia). Some examples of 15 gram carbohydrate snacks include:  Glucose tablets, 3 or 4  Raisins, 2 tablespoons (24 grams)  Jelly beans, 6  Animal crackers, 8  Regular pop, 4 ounces (120 mL)  Gummy treats, 9  Recognize hypoglycemia. Hypoglycemia occurs with blood glucose levels of 70 mg/dL and below. The risk for hypoglycemia increases when fasting or skipping meals, during or after intense exercise, and during sleep. Hypoglycemia symptoms can include:  Tremors or shakes.  Decreased ability to concentrate.  Sweating.  Increased heart rate.  Headache.  Dry mouth.  Hunger.  Irritability.  Anxiety.  Restless sleep.  Altered speech or coordination.  Confusion.  Treat hypoglycemia promptly. If you are alert and able to safely swallow, follow the 15:15 rule:  Take 15-20 grams of rapid-acting glucose or carbohydrate. Rapid-acting options include glucose gel, glucose tablets, or 4 ounces (120 mL) of fruit juice, regular soda, or low fat milk.  Check your blood glucose level 15 minutes after taking the glucose.  Take 15-20 grams more of glucose if the repeat blood glucose level is still 70 mg/dL or below.  Eat a meal or snack within 1 hour once blood glucose levels return to normal.  Be alert to feeling very thirsty and urinating more frequently than usual, which are early signs of hyperglycemia. An early awareness of hyperglycemia allows for prompt treatment. Treat hyperglycemia as directed by your health care provider.  Engage in at least 150 minutes of moderate-intensity physical activity a week, spread over at least 3 days of the week or as directed by your health care provider. In  addition, you should engage in resistance exercise at least 2 times a week or as directed by your health care provider.  Adjust your medicine and food intake as needed if you start a new exercise or sport.  Follow your sick day plan at any time you are unable to eat or drink as usual.  Avoid tobacco use.  Limit alcohol intake to no more than 1 drink per day for nonpregnant women and 2 drinks per day for men. You should drink alcohol only when you are also eating food. Talk with your health care provider whether alcohol is safe for you. Tell your health care provider if you drink alcohol several times a week.  Follow up with your health care provider regularly.  Schedule an eye exam soon after the diagnosis of type 2 diabetes and then annually.  Perform daily skin and foot care. Examine your skin and feet daily for cuts, bruises, redness, nail problems, bleeding, blisters, or sores. A foot exam by a health care provider should be done annually.  Brush your teeth and gums at least twice a day and floss at least once a day. Follow up with your dentist regularly.  Share your diabetes management plan with your workplace or school.  Stay up-to-date with   immunizations.  Learn to manage stress.  Obtain ongoing diabetes education and support as needed.  Participate in, or seek rehabilitation as needed to maintain or improve independence and quality of life. Request a physical or occupational therapy referral if you are having foot or hand numbness or difficulties with grooming, dressing, eating, or physical activity. SEEK MEDICAL CARE IF:   You are unable to eat food or drink fluids for more than 6 hours.  You have nausea and vomiting for more than 6 hours.  Your blood glucose level is over 240 mg/dL.  There is a change in mental status.  You develop an additional serious illness.  You have diarrhea for more than 6 hours.  You have been sick or have had a fever for a couple of days  and are not getting better.  You have pain during any physical activity.  SEEK IMMEDIATE MEDICAL CARE IF:  You have difficulty breathing.  You have moderate to large ketone levels. MAKE SURE YOU:  Understand these instructions.  Will watch your condition.  Will get help right away if you are not doing well or get worse. Document Released: 09/29/2005 Document Revised: 10/04/2013 Document Reviewed: 04/27/2012 Exit[employer] Patient Information 2015 [employer]. This information is not intended to replace advice given to you by your health care provider. Make sure you discuss any questions you have with your health care provider.  

## 2014-04-23 MED ORDER — OMEGA-3-ACID ETHYL ESTERS 1 G PO CAPS: 2.0000 g | ORAL_CAPSULE | Freq: Two times a day (BID) | ORAL | Status: DC

## 2014-04-23 NOTE — Assessment & Plan Note (Signed)
Will treat with lovaza

## 2014-04-23 NOTE — Assessment & Plan Note (Signed)
LDL not able to be calculated due to the high trigs Will treat the trigs and then recheck her in the fasting state

## 2014-04-23 NOTE — Assessment & Plan Note (Signed)
Her blood sugars are better but still too high, I gave her samples of her meds since she is having a hard time paying for her meds

## 2014-04-23 NOTE — Assessment & Plan Note (Signed)
Her BP is well controlled 

## 2014-05-18 ENCOUNTER — Ambulatory Visit: Payer: No Typology Code available for payment source | Admitting: Internal Medicine

## 2014-05-24 ENCOUNTER — Telehealth: Payer: Self-pay | Admitting: *Deleted

## 2014-05-24 DIAGNOSIS — E1149 Type 2 diabetes mellitus with other diabetic neurological complication: Secondary | ICD-10-CM

## 2014-05-24 DIAGNOSIS — IMO0002 Reserved for concepts with insufficient information to code with codable children: Secondary | ICD-10-CM

## 2014-05-24 DIAGNOSIS — E1165 Type 2 diabetes mellitus with hyperglycemia: Secondary | ICD-10-CM

## 2014-05-24 NOTE — Telephone Encounter (Signed)
Pt stated she is having a problem with the levemir insulin. Her husband has lost her job and not able to afford medicines. Pt is wanting to know will md change her back to the insulin he gave sample of. Don't see in her chart what you gave her. Only see the lantus & she stated the lantus was also expensive. He gave her another insulin pen made by lantus but cheaper.Marland KitchenJohny Chess

## 2014-05-24 NOTE — Telephone Encounter (Signed)
Pt called back she stated the name of the insulin was Toujeo...Abigail Collins

## 2014-05-25 MED ORDER — INSULIN GLARGINE 300 UNIT/ML ~~LOC~~ SOPN: 50.0000 [IU] | PEN_INJECTOR | Freq: Every day | SUBCUTANEOUS | Status: DC

## 2014-05-25 NOTE — Telephone Encounter (Signed)
done

## 2014-05-25 NOTE — Telephone Encounter (Signed)
Notified pt md change insulin back to Toujeo. Will leave a sample for pickup as well...Abigail Collins

## 2014-06-21 ENCOUNTER — Other Ambulatory Visit: Payer: Self-pay | Admitting: Internal Medicine

## 2014-08-15 ENCOUNTER — Other Ambulatory Visit: Payer: Self-pay | Admitting: *Deleted

## 2014-08-15 DIAGNOSIS — IMO0002 Reserved for concepts with insufficient information to code with codable children: Secondary | ICD-10-CM

## 2014-08-15 DIAGNOSIS — E1149 Type 2 diabetes mellitus with other diabetic neurological complication: Secondary | ICD-10-CM

## 2014-08-15 DIAGNOSIS — E1165 Type 2 diabetes mellitus with hyperglycemia: Secondary | ICD-10-CM

## 2014-08-15 MED ORDER — DAPAGLIFLOZIN PROPANEDIOL 5 MG PO TABS: 5.0000 mg | ORAL_TABLET | Freq: Every day | ORAL | Status: DC

## 2014-08-15 NOTE — Telephone Encounter (Signed)
Pt states she is needing a rx for Farxiga sent to her pharmacy. MD was giving her samples. Inform pt will send...Johny Chess

## 2014-08-23 ENCOUNTER — Ambulatory Visit: Payer: No Typology Code available for payment source | Admitting: Internal Medicine

## 2015-03-28 ENCOUNTER — Encounter: Payer: Self-pay | Admitting: Internal Medicine

## 2015-03-28 ENCOUNTER — Ambulatory Visit (INDEPENDENT_AMBULATORY_CARE_PROVIDER_SITE_OTHER): Payer: Commercial Managed Care - HMO | Admitting: Internal Medicine

## 2015-03-28 ENCOUNTER — Other Ambulatory Visit (INDEPENDENT_AMBULATORY_CARE_PROVIDER_SITE_OTHER): Payer: Commercial Managed Care - HMO

## 2015-03-28 VITALS — BP 150/94 | HR 103 | Temp 97.7°F | Resp 20 | Ht 64.5 in | Wt 246.0 lb

## 2015-03-28 DIAGNOSIS — Z1231 Encounter for screening mammogram for malignant neoplasm of breast: Secondary | ICD-10-CM

## 2015-03-28 DIAGNOSIS — G4700 Insomnia, unspecified: Secondary | ICD-10-CM | POA: Insufficient documentation

## 2015-03-28 DIAGNOSIS — F32A Depression, unspecified: Secondary | ICD-10-CM | POA: Insufficient documentation

## 2015-03-28 DIAGNOSIS — Z0000 Encounter for general adult medical examination without abnormal findings: Secondary | ICD-10-CM | POA: Diagnosis not present

## 2015-03-28 DIAGNOSIS — E785 Hyperlipidemia, unspecified: Secondary | ICD-10-CM | POA: Diagnosis not present

## 2015-03-28 DIAGNOSIS — E1149 Type 2 diabetes mellitus with other diabetic neurological complication: Secondary | ICD-10-CM

## 2015-03-28 DIAGNOSIS — F450 Somatization disorder: Secondary | ICD-10-CM

## 2015-03-28 DIAGNOSIS — F329 Major depressive disorder, single episode, unspecified: Secondary | ICD-10-CM

## 2015-03-28 DIAGNOSIS — E1165 Type 2 diabetes mellitus with hyperglycemia: Secondary | ICD-10-CM

## 2015-03-28 DIAGNOSIS — I10 Essential (primary) hypertension: Secondary | ICD-10-CM

## 2015-03-28 DIAGNOSIS — L989 Disorder of the skin and subcutaneous tissue, unspecified: Secondary | ICD-10-CM

## 2015-03-28 DIAGNOSIS — IMO0002 Reserved for concepts with insufficient information to code with codable children: Secondary | ICD-10-CM

## 2015-03-28 DIAGNOSIS — E781 Pure hyperglyceridemia: Secondary | ICD-10-CM

## 2015-03-28 DIAGNOSIS — M170 Bilateral primary osteoarthritis of knee: Secondary | ICD-10-CM

## 2015-03-28 DIAGNOSIS — Z1211 Encounter for screening for malignant neoplasm of colon: Secondary | ICD-10-CM | POA: Insufficient documentation

## 2015-03-28 LAB — URINALYSIS, ROUTINE W REFLEX MICROSCOPIC
Bilirubin Urine: NEGATIVE
Hgb urine dipstick: NEGATIVE
Leukocytes, UA: NEGATIVE
Nitrite: NEGATIVE
RBC / HPF: NONE SEEN (ref 0–?)
Specific Gravity, Urine: 1.025 (ref 1.000–1.030)
Total Protein, Urine: NEGATIVE
Urine Glucose: >1000 — AB
Urobilinogen, UA: 0.2 — AB (ref 0.0–1.0)
pH: 6 (ref 5.0–8.0)

## 2015-03-28 LAB — COMPREHENSIVE METABOLIC PANEL
ALT: 29 U/L (ref 0–35)
AST: 34 U/L (ref 0–37)
Albumin: 4.3 g/dL (ref 3.5–5.2)
Alkaline Phosphatase: 90 U/L (ref 39–117)
BUN: 19 mg/dL (ref 6–23)
CO2: 25 mEq/L (ref 19–32)
Calcium: 9.8 mg/dL (ref 8.4–10.5)
Chloride: 101 mEq/L (ref 96–112)
Creatinine, Ser: 0.79 mg/dL (ref 0.40–1.20)
GFR: 78.1 mL/min (ref >60.00)
Glucose, Bld: 314 mg/dL — ABNORMAL HIGH (ref 70–99)
Potassium: 4.7 mEq/L (ref 3.5–5.1)
Sodium: 136 mEq/L (ref 135–145)
Total Bilirubin: 0.6 mg/dL (ref 0.2–1.2)
Total Protein: 7.3 g/dL (ref 6.0–8.3)

## 2015-03-28 LAB — CBC WITH DIFFERENTIAL/PLATELET
Basophils Absolute: 0 10*3/uL (ref 0.0–0.1)
Basophils Relative: 0.5 % (ref 0.0–3.0)
Eosinophils Absolute: 0.1 10*3/uL (ref 0.0–0.7)
Eosinophils Relative: 1.4 % (ref 0.0–5.0)
HCT: 47.6 % — ABNORMAL HIGH (ref 36.0–46.0)
Hemoglobin: 16 g/dL — ABNORMAL HIGH (ref 12.0–15.0)
Lymphocytes Relative: 36.4 % (ref 12.0–46.0)
Lymphs Abs: 3.6 10*3/uL (ref 0.7–4.0)
MCHC: 33.7 g/dL (ref 30.0–36.0)
MCV: 92 fl (ref 78.0–100.0)
Monocytes Absolute: 0.6 10*3/uL (ref 0.1–1.0)
Monocytes Relative: 6.3 % (ref 3.0–12.0)
Neutro Abs: 5.5 10*3/uL (ref 1.4–7.7)
Neutrophils Relative %: 55.4 % (ref 43.0–77.0)
Platelets: 180 10*3/uL (ref 150.0–400.0)
RBC: 5.18 Mil/uL — ABNORMAL HIGH (ref 3.87–5.11)
RDW: 13.1 % (ref 11.5–15.5)
WBC: 10 10*3/uL (ref 4.0–10.5)

## 2015-03-28 LAB — MICROALBUMIN / CREATININE URINE RATIO
Creatinine,U: 81.9 mg/dL
Microalb Creat Ratio: 8.1 mg/g (ref 0.0–30.0)
Microalb, Ur: 6.6 mg/dL — ABNORMAL HIGH (ref 0.0–1.9)

## 2015-03-28 LAB — LIPID PANEL
Cholesterol: 181 mg/dL (ref 0–200)
HDL: 53.5 mg/dL (ref >39.00)
Total CHOL/HDL Ratio: 3
Triglycerides: 445 mg/dL — ABNORMAL HIGH (ref 0.0–149.0)

## 2015-03-28 LAB — GLUCOSE, POCT (MANUAL RESULT ENTRY): POC Glucose: 297 mg/dl — AB (ref 70–99)

## 2015-03-28 LAB — TSH: TSH: 2.32 u[IU]/mL (ref 0.35–4.50)

## 2015-03-28 LAB — HEMOGLOBIN A1C: Hgb A1c MFr Bld: 10.4 % — ABNORMAL HIGH (ref 4.6–6.5)

## 2015-03-28 LAB — LDL CHOLESTEROL, DIRECT: Direct LDL: 75 mg/dL

## 2015-03-28 MED ORDER — SITAGLIPTIN PHOS-METFORMIN HCL 50-1000 MG PO TABS: 1.0000 | ORAL_TABLET | Freq: Two times a day (BID) | ORAL | Status: DC

## 2015-03-28 MED ORDER — SUVOREXANT 20 MG PO TABS: 1.0000 | ORAL_TABLET | Freq: Every evening | ORAL | Status: DC | PRN

## 2015-03-28 MED ORDER — TRAZODONE HCL 150 MG PO TABS: 150.0000 mg | ORAL_TABLET | Freq: Every evening | ORAL | Status: DC | PRN

## 2015-03-28 MED ORDER — AZILSARTAN MEDOXOMIL 80 MG PO TABS: 1.0000 | ORAL_TABLET | Freq: Every day | ORAL | Status: DC

## 2015-03-28 MED ORDER — EMPAGLIFLOZIN 10 MG PO TABS: 10.0000 mg | ORAL_TABLET | Freq: Every day | ORAL | Status: DC

## 2015-03-28 NOTE — Progress Notes (Signed)
Subjective:  Patient ID: Abigail Collins, female    DOB: 07/04/1951  Age: 64 y.o. MRN: [id number]  CC: Diabetes; Hypertension; Depression; and Annual Exam   HPI Abigail Collins presents for CPX and follow up on DM2 - she has been using insulin to try to control the blood sugar but it remains consistently over 250, she has not been able to afford any of the other medications.  Outpatient Prescriptions Prior to Visit  Medication Sig Dispense Refill  . Blood Glucose Monitoring Suppl (FREESTYLE FREEDOM LITE) W/DEVICE KIT 1 Act by Does not apply route 3 (three) times daily. 2 each 0  . glucose blood (FREESTYLE LITE) test strip Test up to TID dx  260.62 100 each 12  . Insulin Glargine (TOUJEO SOLOSTAR) 300 UNIT/ML SOPN Inject 50 Units into the skin daily. 1.5 mL 11  . omega-3 acid ethyl esters (LOVAZA) 1 G capsule Take 2 capsules (2 g total) by mouth 2 (two) times daily. 120 capsule 11  . albuterol (PROVENTIL HFA;VENTOLIN HFA) 108 (90 BASE) MCG/ACT inhaler Inhale 2 puffs into the lungs every 6 (six) hours as needed. For shortness of breath.    . dapagliflozin propanediol (FARXIGA) 5 MG TABS tablet Take 5 mg by mouth daily. 90 tablet 1  . mometasone-formoterol (DULERA) 100-5 MCG/ACT AERO Inhale 2 puffs into the lungs 2 (two) times daily. 3 Inhaler 0  . Pitavastatin Calcium (LIVALO) 2 MG TABS Take 1 tablet (2 mg total) by mouth daily. 90 tablet 3  . SitaGLIPtin-MetFORMIN HCl (JANUMET XR) 50-1000 MG TB24 Take 2 tablets by mouth daily. 180 tablet 3   No facility-administered medications prior to visit.    ROS Review of Systems  Constitutional: Negative.  Negative for fever, chills, diaphoresis, appetite change and fatigue.  HENT: Negative.  Negative for trouble swallowing and voice change.   Eyes: Negative.   Respiratory: Negative.  Negative for cough, choking, chest tightness, shortness of breath and stridor.   Cardiovascular: Negative.  Negative for chest pain, palpitations and leg  swelling.  Gastrointestinal: Negative.  Negative for nausea, vomiting, abdominal pain, diarrhea, constipation and blood in stool.  Endocrine: Positive for polyuria. Negative for polydipsia and polyphagia.  Genitourinary: Negative.  Negative for difficulty urinating.  Musculoskeletal: Positive for arthralgias (left knee pain). Negative for myalgias, back pain, joint swelling and neck pain.  Skin: Negative.  Negative for rash.       Red scaly lesion on right FA  Allergic/Immunologic: Negative.   Neurological: Negative.  Negative for dizziness, tremors, weakness, light-headedness and headaches.  Hematological: Negative.  Negative for adenopathy. Does not bruise/bleed easily.  Psychiatric/Behavioral: Positive for sleep disturbance and dysphoric mood. Negative for suicidal ideas, hallucinations, behavioral problems, confusion, self-injury, decreased concentration and agitation. The patient is nervous/anxious. The patient is not hyperactive.        She complains of anhedonia, crying spells, weight gain, insomnia, and anxiety.    Objective:  BP 150/94 mmHg  Pulse 103  Temp(Src) 97.7 F (36.5 C) (Oral)  Resp 20  Ht 5' 4.5" (1.638 m)  Wt 246 lb (111.585 kg)  BMI 41.59 kg/m2  SpO2 95%  BP Readings from Last 3 Encounters:  03/28/15 150/94  04/21/14 122/80  03/15/14 108/72    Wt Readings from Last 3 Encounters:  03/28/15 246 lb (111.585 kg)  04/21/14 237 lb 6.4 oz (107.684 kg)  03/15/14 236 lb (107.049 kg)    Physical Exam  Constitutional: She is oriented to person, place, and time. She appears well-developed and  well-nourished. No distress.  HENT:  Head: Normocephalic and atraumatic.  Mouth/Throat: Oropharynx is clear and moist. No oropharyngeal exudate.  Eyes: Conjunctivae are normal. Right eye exhibits no discharge. Left eye exhibits no discharge. No scleral icterus.  Neck: Normal range of motion. Neck supple. No JVD present. No tracheal deviation present. No thyromegaly present.    Cardiovascular: Normal rate, regular rhythm, normal heart sounds and intact distal pulses.  Exam reveals no gallop and no friction rub.   No murmur heard. Pulmonary/Chest: Effort normal and breath sounds normal. No stridor. No respiratory distress. She has no wheezes. She has no rales. She exhibits no tenderness.  Abdominal: Soft. Bowel sounds are normal. She exhibits no distension and no mass. There is no tenderness. There is no rebound and no guarding.  Musculoskeletal: Normal range of motion. She exhibits no edema or tenderness.  Lymphadenopathy:    She has no cervical adenopathy.  Neurological: She is oriented to person, place, and time.  Skin: Skin is dry. No rash noted. She is not diaphoretic. No erythema. No pallor.     Psychiatric: Her speech is normal and behavior is normal. Judgment and thought content normal. Her mood appears not anxious. Her affect is not angry, not blunt, not labile and not inappropriate. Cognition and memory are normal. She exhibits a depressed mood. She expresses no homicidal and no suicidal ideation. She expresses no suicidal plans and no homicidal plans.  She is tearful today  Vitals reviewed.   Lab Results  Component Value Date   WBC 10.0 03/28/2015   HGB 16.0* 03/28/2015   HCT 47.6* 03/28/2015   PLT 180.0 03/28/2015   GLUCOSE 314* 03/28/2015   CHOL 181 03/28/2015   TRIG * 03/28/2015    445.0 Triglyceride is over 400; calculations on Lipids are invalid.   HDL 53.50 03/28/2015   LDLDIRECT 75.0 03/28/2015   LDLCALC UNABLE TO CALCULATE IF TRIGLYCERIDE OVER 400 mg/dL 04/06/2012   ALT 29 03/28/2015   AST 34 03/28/2015   NA 136 03/28/2015   K 4.7 03/28/2015   CL 101 03/28/2015   CREATININE 0.79 03/28/2015   BUN 19 03/28/2015   CO2 25 03/28/2015   TSH 2.32 03/28/2015   INR 0.88 09/03/2012   HGBA1C 10.4* 03/28/2015   MICROALBUR 6.6* 03/28/2015    Dg Chest 2 View  02/13/2014   CLINICAL DATA:  Cough and shortness of Breath  EXAM: CHEST  2 VIEW   COMPARISON:  08/05/2012  FINDINGS: Cardiac silhouette is normal in size and configuration. Normal mediastinal and hilar contours. Lungs are clear. No pleural effusion. No pneumothorax.  Vertebroplasty has been performed in the upper lumbar spine. The bony thorax is demineralized but otherwise intact.  IMPRESSION: No active cardiopulmonary disease.   Electronically Signed   By: Lajean Manes M.D.   On: 02/13/2014 12:01    Assessment & Plan:   Darene was seen today for diabetes, hypertension, depression and annual exam.  Diagnoses and all orders for this visit:  HTN (hypertension), benign - will start edarbi for this  Orders: -     Comprehensive metabolic panel; Future -     CBC with Differential/Platelet; Future -     Urinalysis, Routine w reflex microscopic (not at [facility]); Future -     Azilsartan Medoxomil (EDARBI) 80 MG TABS; Take 1 tablet (80 mg total) by mouth daily.  Hyperlipidemia with target LDL less than 100 - she has achieved her LDL goal and at this time is not willing to start a statin  Orders: -     Lipid panel; Future -     TSH; Future  Diabetes mellitus with neurological manifestations, uncontrolled - her blood sugars are too high as evidenced by her A1C, will restart the oral meds and will cont toujeo Orders: -     Microalbumin / creatinine urine ratio; Future -     Hemoglobin A1c; Future -     empagliflozin (JARDIANCE) 10 MG TABS tablet; Take 10 mg by mouth daily. -     Azilsartan Medoxomil (EDARBI) 80 MG TABS; Take 1 tablet (80 mg total) by mouth daily. -     sitaGLIPtin-metformin (JANUMET) 50-1000 MG per tablet; Take 1 tablet by mouth 2 (two) times daily with a meal. -     Ambulatory referral to Ophthalmology -     Amb Referral to Nutrition and Diabetic E -     POCT glucose (manual entry)  Pure hyperglyceridemia - improvement noted Orders: -     Lipid panel; Future -     Amb Referral to Nutrition and Diabetic E  Visit for screening mammogram Orders: -     MM  DIGITAL SCREENING BILATERAL; Future  Colon cancer screening Orders: -     Ambulatory referral to Gastroenterology  Depression with somatization Orders: -     traZODone (DESYREL) 150 MG tablet; Take 1 tablet (150 mg total) by mouth at bedtime as needed for sleep.  Insomnia Orders: -     Suvorexant (BELSOMRA) 20 MG TABS; Take 1 tablet by mouth at bedtime as needed. -     traZODone (DESYREL) 150 MG tablet; Take 1 tablet (150 mg total) by mouth at bedtime as needed for sleep.  Primary osteoarthritis of both knees Orders: -     Ambulatory referral to Orthopedic Surgery  Skin lesion of right arm Orders: -     Ambulatory referral to Dermatology  I have discontinued Ms. Furry's mometasone-formoterol, albuterol, SitaGLIPtin-MetFORMIN HCl, and Pitavastatin Calcium. I have also changed her dapagliflozin propanediol to empagliflozin. Additionally, I am having her start on Suvorexant, Azilsartan Medoxomil, sitaGLIPtin-metformin, and traZODone. Lastly, I am having her maintain her FREESTYLE FREEDOM LITE, glucose blood, omega-3 acid ethyl esters, and Insulin Glargine.  Meds ordered this encounter  Medications  . Suvorexant (BELSOMRA) 20 MG TABS    Sig: Take 1 tablet by mouth at bedtime as needed.    Dispense:  30 tablet    Refill:  5  . empagliflozin (JARDIANCE) 10 MG TABS tablet    Sig: Take 10 mg by mouth daily.    Dispense:  30 tablet    Refill:  5  . Azilsartan Medoxomil (EDARBI) 80 MG TABS    Sig: Take 1 tablet (80 mg total) by mouth daily.    Dispense:  30 tablet    Refill:  11  . sitaGLIPtin-metformin (JANUMET) 50-1000 MG per tablet    Sig: Take 1 tablet by mouth 2 (two) times daily with a meal.    Dispense:  180 tablet    Refill:  2  . traZODone (DESYREL) 150 MG tablet    Sig: Take 1 tablet (150 mg total) by mouth at bedtime as needed for sleep.    Dispense:  90 tablet    Refill:  1   See AVS for instructions about healthy living and anticipatory guidance.  Follow-up:  Return in about 4 weeks (around 04/25/2015).  Scarlette Calico, MD

## 2015-03-28 NOTE — Patient Instructions (Signed)

## 2015-03-29 NOTE — Assessment & Plan Note (Signed)

## 2015-04-25 ENCOUNTER — Encounter: Payer: Self-pay | Admitting: Internal Medicine

## 2015-05-01 ENCOUNTER — Encounter: Payer: Self-pay | Admitting: Internal Medicine

## 2015-05-01 ENCOUNTER — Ambulatory Visit (INDEPENDENT_AMBULATORY_CARE_PROVIDER_SITE_OTHER): Payer: Commercial Managed Care - HMO | Admitting: Internal Medicine

## 2015-05-01 VITALS — BP 140/84 | HR 95 | Temp 97.8°F | Resp 16 | Ht 64.5 in | Wt 245.0 lb

## 2015-05-01 DIAGNOSIS — F450 Somatization disorder: Secondary | ICD-10-CM

## 2015-05-01 DIAGNOSIS — IMO0002 Reserved for concepts with insufficient information to code with codable children: Secondary | ICD-10-CM

## 2015-05-01 DIAGNOSIS — I10 Essential (primary) hypertension: Secondary | ICD-10-CM

## 2015-05-01 DIAGNOSIS — F329 Major depressive disorder, single episode, unspecified: Secondary | ICD-10-CM

## 2015-05-01 DIAGNOSIS — E1165 Type 2 diabetes mellitus with hyperglycemia: Secondary | ICD-10-CM

## 2015-05-01 DIAGNOSIS — E1149 Type 2 diabetes mellitus with other diabetic neurological complication: Secondary | ICD-10-CM | POA: Diagnosis not present

## 2015-05-01 DIAGNOSIS — F32A Depression, unspecified: Secondary | ICD-10-CM

## 2015-05-01 NOTE — Patient Instructions (Signed)

## 2015-05-01 NOTE — Progress Notes (Signed)
Pre visit review using our clinic review tool, if applicable. No additional management support is needed unless otherwise documented below in the visit note. 

## 2015-05-03 NOTE — Assessment & Plan Note (Signed)
Her blood sugars have not been well controlled, she is committed to better compliance with her medication and lifestyle modifications, will recheck her A1c in about 3 months.

## 2015-05-03 NOTE — Assessment & Plan Note (Signed)
Improvement noted on her blood pressure, will continue the ARB.

## 2015-05-03 NOTE — Assessment & Plan Note (Signed)
Improvement noted 

## 2015-05-03 NOTE — Progress Notes (Signed)
Subjective:  Patient ID: Abigail Collins, female    DOB: [birth year]  Age: 64 y.o. MRN: [id number]  CC: Diabetes and Hypertension   HPI Abigail Collins presents for a blood pressure check. She is feeling somewhat better since I last saw her. She is sleeping better and feels like her mood is better.  Outpatient Prescriptions Prior to Visit  Medication Sig Dispense Refill  . Azilsartan Medoxomil (EDARBI) 80 MG TABS Take 1 tablet (80 mg total) by mouth daily. 30 tablet 11  . Blood Glucose Monitoring Suppl (FREESTYLE FREEDOM LITE) W/DEVICE KIT 1 Act by Does not apply route 3 (three) times daily. 2 each 0  . empagliflozin (JARDIANCE) 10 MG TABS tablet Take 10 mg by mouth daily. 30 tablet 5  . glucose blood (FREESTYLE LITE) test strip Test up to TID dx  260.62 100 each 12  . Insulin Glargine (TOUJEO SOLOSTAR) 300 UNIT/ML SOPN Inject 50 Units into the skin daily. 1.5 mL 11  . omega-3 acid ethyl esters (LOVAZA) 1 G capsule Take 2 capsules (2 g total) by mouth 2 (two) times daily. 120 capsule 11  . sitaGLIPtin-metformin (JANUMET) 50-1000 MG per tablet Take 1 tablet by mouth 2 (two) times daily with a meal. 180 tablet 2  . Suvorexant (BELSOMRA) 20 MG TABS Take 1 tablet by mouth at bedtime as needed. 30 tablet 5  . traZODone (DESYREL) 150 MG tablet Take 1 tablet (150 mg total) by mouth at bedtime as needed for sleep. 90 tablet 1   No facility-administered medications prior to visit.    ROS Review of Systems  Constitutional: Negative.  Negative for fever, chills, diaphoresis, appetite change and fatigue.  HENT: Negative.   Eyes: Negative.   Respiratory: Negative.  Negative for cough, choking, chest tightness, shortness of breath and stridor.   Cardiovascular: Negative.  Negative for chest pain, palpitations and leg swelling.  Gastrointestinal: Negative.  Negative for nausea, vomiting, abdominal pain, diarrhea, constipation and blood in stool.  Endocrine: Positive for polyphagia and polyuria.  Negative for polydipsia.  Genitourinary: Negative.  Negative for dysuria, urgency, hematuria, decreased urine volume, enuresis and difficulty urinating.  Musculoskeletal: Positive for arthralgias. Negative for myalgias and back pain.  Skin: Negative.   Allergic/Immunologic: Negative.   Neurological: Negative.  Negative for dizziness, tremors, weakness, light-headedness and numbness.  Hematological: Negative.  Negative for adenopathy. Does not bruise/bleed easily.  Psychiatric/Behavioral: Positive for sleep disturbance. Negative for suicidal ideas, dysphoric mood and decreased concentration. The patient is not nervous/anxious.     Objective:  BP 140/84 mmHg  Pulse 95  Temp(Src) 97.8 F (36.6 C) (Oral)  Resp 16  Ht 5' 4.5" (1.638 m)  Wt 245 lb (111.131 kg)  BMI 41.42 kg/m2  SpO2 98%  BP Readings from Last 3 Encounters:  05/01/15 140/84  03/28/15 150/94  04/21/14 122/80    Wt Readings from Last 3 Encounters:  05/01/15 245 lb (111.131 kg)  03/28/15 246 lb (111.585 kg)  04/21/14 237 lb 6.4 oz (107.684 kg)    Physical Exam  Constitutional: She is oriented to person, place, and time. No distress.  HENT:  Mouth/Throat: Oropharynx is clear and moist. No oropharyngeal exudate.  Eyes: Conjunctivae are normal. Right eye exhibits no discharge. Left eye exhibits no discharge. No scleral icterus.  Neck: Normal range of motion. Neck supple. No JVD present. No tracheal deviation present. No thyromegaly present.  Cardiovascular: Normal rate, regular rhythm, normal heart sounds and intact distal pulses.  Exam reveals no gallop and no friction  rub.   No murmur heard. Pulmonary/Chest: Effort normal and breath sounds normal. No stridor. No respiratory distress. She has no wheezes. She has no rales. She exhibits no tenderness.  Abdominal: Soft. Bowel sounds are normal. She exhibits no distension and no mass. There is no tenderness. There is no rebound and no guarding.  Musculoskeletal: She  exhibits no edema or tenderness.  Lymphadenopathy:    She has no cervical adenopathy.  Neurological: She is oriented to person, place, and time.  Skin: Skin is warm and dry. She is not diaphoretic. No erythema. No pallor.    Lab Results  Component Value Date   WBC 10.0 03/28/2015   HGB 16.0* 03/28/2015   HCT 47.6* 03/28/2015   PLT 180.0 03/28/2015   GLUCOSE 314* 03/28/2015   CHOL 181 03/28/2015   TRIG * 03/28/2015    445.0 Triglyceride is over 400; calculations on Lipids are invalid.   HDL 53.50 03/28/2015   LDLDIRECT 75.0 03/28/2015   LDLCALC UNABLE TO CALCULATE IF TRIGLYCERIDE OVER 400 mg/dL 04/06/2012   ALT 29 03/28/2015   AST 34 03/28/2015   NA 136 03/28/2015   K 4.7 03/28/2015   CL 101 03/28/2015   CREATININE 0.79 03/28/2015   BUN 19 03/28/2015   CO2 25 03/28/2015   TSH 2.32 03/28/2015   INR 0.88 09/03/2012   HGBA1C 10.4* 03/28/2015   MICROALBUR 6.6* 03/28/2015    Dg Chest 2 View  02/13/2014   CLINICAL DATA:  Cough and shortness of Breath  EXAM: CHEST  2 VIEW  COMPARISON:  08/05/2012  FINDINGS: Cardiac silhouette is normal in size and configuration. Normal mediastinal and hilar contours. Lungs are clear. No pleural effusion. No pneumothorax.  Vertebroplasty has been performed in the upper lumbar spine. The bony thorax is demineralized but otherwise intact.  IMPRESSION: No active cardiopulmonary disease.   Electronically Signed   By: Lajean Manes M.D.   On: 02/13/2014 12:01    Assessment & Plan:   There are no diagnoses linked to this encounter. I am having Ms. Easler maintain her FREESTYLE FREEDOM LITE, glucose blood, omega-3 acid ethyl esters, Insulin Glargine, Suvorexant, empagliflozin, Azilsartan Medoxomil, sitaGLIPtin-metformin, and traZODone.  No orders of the defined types were placed in this encounter.     Follow-up: Return in about 4 months (around 09/01/2015).  Scarlette Calico, MD

## 2015-05-22 ENCOUNTER — Encounter: Payer: Self-pay | Admitting: Internal Medicine

## 2015-06-04 ENCOUNTER — Telehealth: Payer: Self-pay | Admitting: Internal Medicine

## 2015-06-04 MED ORDER — METRONIDAZOLE 0.75 % VA GEL: 1.0000 | Freq: Two times a day (BID) | VAGINAL | Status: DC

## 2015-06-04 MED ORDER — FLUCONAZOLE 150 MG PO TABS: 150.0000 mg | ORAL_TABLET | Freq: Once | ORAL | Status: DC

## 2015-06-04 NOTE — Telephone Encounter (Signed)
I will treat her for both yeast and bacterial vaginosis with Diflucan and metronidazole

## 2015-06-04 NOTE — Telephone Encounter (Signed)
Notified pt with md response.../lmb 

## 2015-06-04 NOTE — Telephone Encounter (Signed)
Patient called requesting a prescription for medication to treat a yeast infection. She states she has a fishy smelling discharge and itchiness. Pt uses [facility] on [address].

## 2015-06-27 ENCOUNTER — Ambulatory Visit (AMBULATORY_SURGERY_CENTER): Payer: Self-pay

## 2015-06-27 VITALS — Ht 63.25 in | Wt 245.0 lb

## 2015-06-27 DIAGNOSIS — Z1211 Encounter for screening for malignant neoplasm of colon: Secondary | ICD-10-CM

## 2015-06-27 NOTE — Progress Notes (Signed)
No allergies to eggs or soy No diet/weight loss meds No home oxygen PONV general anesthesia  Refused emmi

## 2015-07-09 LAB — HM COLONOSCOPY

## 2015-07-11 ENCOUNTER — Ambulatory Visit (AMBULATORY_SURGERY_CENTER): Payer: Commercial Managed Care - HMO | Admitting: Internal Medicine

## 2015-07-11 ENCOUNTER — Encounter: Payer: Self-pay | Admitting: Internal Medicine

## 2015-07-11 VITALS — BP 124/72 | HR 83 | Temp 95.9°F | Resp 16 | Ht 63.0 in | Wt 245.0 lb

## 2015-07-11 DIAGNOSIS — D128 Benign neoplasm of rectum: Secondary | ICD-10-CM

## 2015-07-11 DIAGNOSIS — K621 Rectal polyp: Secondary | ICD-10-CM

## 2015-07-11 DIAGNOSIS — Z1211 Encounter for screening for malignant neoplasm of colon: Secondary | ICD-10-CM | POA: Diagnosis present

## 2015-07-11 LAB — GLUCOSE, CAPILLARY
Glucose-Capillary: 267 mg/dL — ABNORMAL HIGH (ref 65–99)
Glucose-Capillary: 254 mg/dL — ABNORMAL HIGH (ref 65–99)

## 2015-07-11 MED ORDER — SODIUM CHLORIDE 0.9 % IV SOLN: 500.0000 mL | INTRAVENOUS | Status: DC

## 2015-07-11 NOTE — Progress Notes (Signed)
Called to room to assist during endoscopic procedure.  Patient ID and intended procedure confirmed with present staff. Received instructions for my participation in the procedure from the performing physician.  

## 2015-07-11 NOTE — Patient Instructions (Addendum)
I found and removed one small polyp. I will let you know pathology results and when to have another routine colonoscopy by mail.   You also have a condition called diverticulosis - common and not usually a problem. Please read the handout provided.   Your abdominal pain could be from Januvia - one of the drugs in [place]. It could also be from high blood sugars. Try Prilosec OTC 1 each morning to see if that helps I also suggest you talk to Dr. Ronnald Ramp about this at next visit  I appreciate the opportunity to care for you. Gatha Mayer, MD, [facility]  Discharge instructions given. Handouts on polyps and diverticulosis. Resume previous medications. YOU HAD AN ENDOSCOPIC PROCEDURE TODAY AT [facility]:   Refer to the procedure report that was given to you for any specific questions about what was found during the examination.  If the procedure report does not answer your questions, please call your gastroenterologist to clarify.  If you requested that your care partner not be given the details of your procedure findings, then the procedure report has been included in a sealed envelope for you to review at your convenience later.  YOU SHOULD EXPECT: Some feelings of bloating in the abdomen. Passage of more gas than usual.  Walking can help get rid of the air that was put into your GI tract during the procedure and reduce the bloating. If you had a lower endoscopy (such as a colonoscopy or flexible sigmoidoscopy) you may notice spotting of blood in your stool or on the toilet paper. If you underwent a bowel prep for your procedure, you may not have a normal bowel movement for a few days.  Please Note:  You might notice some irritation and congestion in your nose or some drainage.  This is from the oxygen used during your procedure.  There is no need for concern and it should clear up in a day or so.  SYMPTOMS TO REPORT IMMEDIATELY:   Following lower endoscopy (colonoscopy  or flexible sigmoidoscopy):  Excessive amounts of blood in the stool  Significant tenderness or worsening of abdominal pains  Swelling of the abdomen that is new, acute  Fever of 100F or higher   For urgent or emergent issues, a gastroenterologist can be reached at any hour by calling [phone].   DIET: Your first meal following the procedure should be a small meal and then it is ok to progress to your normal diet. Heavy or fried foods are harder to digest and may make you feel nauseous or bloated.  Likewise, meals heavy in dairy and vegetables can increase bloating.  Drink plenty of fluids but you should avoid alcoholic beverages for 24 hours.  ACTIVITY:  You should plan to take it easy for the rest of today and you should NOT DRIVE or use heavy machinery until tomorrow (because of the sedation medicines used during the test).    FOLLOW UP: Our staff will call the number listed on your records the next business day following your procedure to check on you and address any questions or concerns that you may have regarding the information given to you following your procedure. If we do not reach you, we will leave a message.  However, if you are feeling well and you are not experiencing any problems, there is no need to return our call.  We will assume that you have returned to your regular daily activities without incident.  If any biopsies  were taken you will be contacted by phone or by letter within the next 1-3 weeks.  Please call us at [phone] if you have not heard about the biopsies in 3 weeks.    SIGNATURES/CONFIDENTIALITY: You and/or your care partner have signed paperwork which will be entered into your electronic medical record.  These signatures attest to the fact that that the information above on your After Visit Summary has been reviewed and is understood.  Full responsibility of the confidentiality of this discharge information lies with you and/or your care-partner.

## 2015-07-11 NOTE — Progress Notes (Signed)
Transferred to recovery room. A/O x3, pleased with MAC.  VSS.  Report to Celia, RN. 

## 2015-07-11 NOTE — Op Note (Signed)
[facility]  [address] [place], [id number]   COLONOSCOPY PROCEDURE REPORT  PATIENT: Abigail, Collins  MR#: [id number] BIRTHDATE: Mar 07, 1951 , 64  yrs. old GENDER: female ENDOSCOPIST: Gatha Mayer, MD, [facility] PROCEDURE DATE:  07/11/2015 PROCEDURE:   Colonoscopy, screening and Colonoscopy with snare polypectomy First Screening Colonoscopy - Avg.  risk and is 50 yrs.  old or older Yes.  Prior Negative Screening - Now for repeat screening. N/A  History of Adenoma - Now for follow-up colonoscopy & has been > or = to 3 yrs.  N/A  Polyps removed today? Yes ASA CLASS: INDICATIONS:Screening for colonic neoplasia and Colorectal Neoplasm Risk Assessment for this procedure is average risk. MEDICATIONS: Propofol 250 mg IV  DESCRIPTION OF PROCEDURE:   After the risks benefits and alternatives of the procedure were thoroughly explained, informed consent was obtained.  The digital rectal exam revealed no abnormalities of the rectum.   The LB [id number]  endoscope was introduced through the anus and advanced to the cecum, which was identified by both the appendix and ileocecal valve. No adverse events experienced.   The quality of the prep was good.  (MiraLax was used)  The instrument was then slowly withdrawn as the colon was fully examined. Estimated blood loss is zero unless otherwise noted in this procedure report.   COLON FINDINGS: A polypoid shaped sessile polyp measuring 5 mm in size was found.  A polypectomy was performed with a cold snare. The resection was complete, the polyp tissue was completely retrieved and sent to histology.   There was moderate diverticulosis noted in the left colon.   The examination was otherwise normal.   Right colon retroflexion included.  Retroflexed views revealed no abnormalities. The time to cecum = 2.6 Withdrawal time = 11.2   The scope was withdrawn and the procedure completed. COMPLICATIONS: There were no immediate  complications.  ENDOSCOPIC IMPRESSION: 1.   Sessile polyp was found; polypectomy was performed with a cold snare 2.   Moderate diverticulosis was noted in the left colon 3.   The examination was otherwise normal - good prep first screening  RECOMMENDATIONS: Timing of repeat colonoscopy will be determined by pathology findings. She is c/o epigastric pain - ? if from [facility] in [place] - advised her to discuss w/ Dr. Adelina Mings also be from uncontrolled diabetes Will advise Prilosec OTC trial  eSigned:  Gatha Mayer, MD, [facility] 07/11/2015 10:49 AM   cc: Abigail Calico, MD and The Patient

## 2015-07-11 NOTE — Progress Notes (Signed)
Dr Carlean Purl, notified of elevated blood pressure and glucose. States he is not concerned with blood sugar at this time, Anesthesia to manage blood pressure.

## 2015-07-12 ENCOUNTER — Telehealth: Payer: Self-pay | Admitting: *Deleted

## 2015-07-12 NOTE — Telephone Encounter (Signed)
  Follow up Call-  Call back number 07/11/2015  Post procedure Call Back phone  # [phone]  Permission to leave phone message Yes     Patient questions:  Do you have a fever, pain , or abdominal swelling? No. Pain Score  0 *  Have you tolerated food without any problems? Yes.    Have you been able to return to your normal activities? Yes.    Do you have any questions about your discharge instructions: Diet   No. Medications  No. Follow up visit  No.  Do you have questions or concerns about your Care? No.  Actions: * If pain score is 4 or above: No action needed, pain <4.

## 2015-07-18 ENCOUNTER — Encounter: Payer: Self-pay | Admitting: Internal Medicine

## 2015-07-18 NOTE — Progress Notes (Signed)
Quick Note:  Hyperplastic polyp - 10 yr recall colonoscopy ______

## 2015-07-21 NOTE — Addendum Note (Signed)
Addended by: Janith Lima on: 07/21/2015 11:53 AM   Modules accepted: Miquel Dunn

## 2015-07-31 ENCOUNTER — Telehealth: Payer: Self-pay | Admitting: Internal Medicine

## 2015-07-31 NOTE — Telephone Encounter (Signed)
Samples are up front for her

## 2015-07-31 NOTE — Telephone Encounter (Signed)
Please advise 

## 2015-07-31 NOTE — Telephone Encounter (Signed)
Pt informed.Samples at front

## 2015-07-31 NOTE — Telephone Encounter (Signed)
Patient is going to call back to schedule an appointment with Dr. Ronnald Ramp.  She states her GI doctor thinks that her diabetes meds is causing her to have stomach issues.  Patient states she also can not afford the current meds. There is not an opening to get her in this week.  She is currently out of meds.  She is requesting samples until she can get in for an appointment.

## 2015-08-02 ENCOUNTER — Other Ambulatory Visit: Payer: Self-pay

## 2015-08-02 DIAGNOSIS — E1165 Type 2 diabetes mellitus with hyperglycemia: Secondary | ICD-10-CM

## 2015-08-02 DIAGNOSIS — IMO0002 Reserved for concepts with insufficient information to code with codable children: Secondary | ICD-10-CM

## 2015-08-02 DIAGNOSIS — E1149 Type 2 diabetes mellitus with other diabetic neurological complication: Secondary | ICD-10-CM

## 2015-08-02 MED ORDER — INSULIN GLARGINE 300 UNIT/ML ~~LOC~~ SOPN: 50.0000 [IU] | PEN_INJECTOR | Freq: Every day | SUBCUTANEOUS | Status: DC

## 2015-08-06 ENCOUNTER — Ambulatory Visit: Payer: Commercial Managed Care - HMO | Admitting: Internal Medicine

## 2015-09-03 ENCOUNTER — Other Ambulatory Visit (INDEPENDENT_AMBULATORY_CARE_PROVIDER_SITE_OTHER): Payer: Commercial Managed Care - HMO

## 2015-09-03 ENCOUNTER — Encounter: Payer: Self-pay | Admitting: Internal Medicine

## 2015-09-03 ENCOUNTER — Ambulatory Visit (INDEPENDENT_AMBULATORY_CARE_PROVIDER_SITE_OTHER): Payer: Commercial Managed Care - HMO | Admitting: Internal Medicine

## 2015-09-03 VITALS — BP 136/82 | HR 100 | Temp 98.6°F | Resp 20 | Ht 63.0 in | Wt 238.8 lb

## 2015-09-03 DIAGNOSIS — E1149 Type 2 diabetes mellitus with other diabetic neurological complication: Secondary | ICD-10-CM

## 2015-09-03 DIAGNOSIS — I10 Essential (primary) hypertension: Secondary | ICD-10-CM

## 2015-09-03 DIAGNOSIS — IMO0002 Reserved for concepts with insufficient information to code with codable children: Secondary | ICD-10-CM

## 2015-09-03 DIAGNOSIS — E781 Pure hyperglyceridemia: Secondary | ICD-10-CM | POA: Diagnosis not present

## 2015-09-03 DIAGNOSIS — Z299 Encounter for prophylactic measures, unspecified: Secondary | ICD-10-CM

## 2015-09-03 DIAGNOSIS — E1165 Type 2 diabetes mellitus with hyperglycemia: Secondary | ICD-10-CM

## 2015-09-03 DIAGNOSIS — Z418 Encounter for other procedures for purposes other than remedying health state: Secondary | ICD-10-CM | POA: Diagnosis not present

## 2015-09-03 DIAGNOSIS — M1712 Unilateral primary osteoarthritis, left knee: Secondary | ICD-10-CM | POA: Diagnosis not present

## 2015-09-03 LAB — HEMOGLOBIN A1C: Hgb A1c MFr Bld: 10 % — ABNORMAL HIGH (ref 4.6–6.5)

## 2015-09-03 LAB — BASIC METABOLIC PANEL
BUN: 20 mg/dL (ref 6–23)
CO2: 26 mEq/L (ref 19–32)
Calcium: 10.6 mg/dL — ABNORMAL HIGH (ref 8.4–10.5)
Chloride: 98 mEq/L (ref 96–112)
Creatinine, Ser: 1.02 mg/dL (ref 0.40–1.20)
GFR: 58.08 mL/min — ABNORMAL LOW (ref >60.00)
Glucose, Bld: 288 mg/dL — ABNORMAL HIGH (ref 70–99)
Potassium: 4.8 mEq/L (ref 3.5–5.1)
Sodium: 135 mEq/L (ref 135–145)

## 2015-09-03 LAB — TRIGLYCERIDES: Triglycerides: 443 mg/dL — ABNORMAL HIGH (ref 0.0–149.0)

## 2015-09-03 MED ORDER — GLUCOSE BLOOD VI STRP: ORAL_STRIP | Status: DC

## 2015-09-03 MED ORDER — METFORMIN HCL 1000 MG PO TABS: 1000.0000 mg | ORAL_TABLET | Freq: Two times a day (BID) | ORAL | Status: DC

## 2015-09-03 MED ORDER — ONETOUCH VERIO IQ SYSTEM W/DEVICE KIT: 1.0000 | PACK | Freq: Three times a day (TID) | Status: DC

## 2015-09-03 MED ORDER — INSULIN NPH (HUMAN) (ISOPHANE) 100 UNIT/ML ~~LOC~~ SUSP: 20.0000 [IU] | Freq: Two times a day (BID) | SUBCUTANEOUS | Status: DC

## 2015-09-03 NOTE — Progress Notes (Signed)
Subjective:  Patient ID: [name], female    DOB: 1951-03-17  Age: 64 y.o. MRN: [id number]  CC: Hyperlipidemia; Hypertension; and Diabetes   HPI CYRAH MCLAMB presents for follow-up on diabetes. She has been seen by gastroenterology for episodes of epigastric abdominal pain and she has been asked to stop taking Januvia to see if the pain gets better. She does inform me that the epigastric pain has resolved since she saw GI. She also tells me that she just can't afford medications to treat her diabetes. She was given a prescription for for basal insulin but she says it costs her couple $100 a month. She has worsening left knee pain and wants to see an orthopedist about this.  Outpatient Prescriptions Prior to Visit  Medication Sig Dispense Refill  . Azilsartan Medoxomil (EDARBI) 80 MG TABS Take 1 tablet (80 mg total) by mouth daily. 30 tablet 11  . Blood Glucose Monitoring Suppl (FREESTYLE FREEDOM LITE) W/DEVICE KIT 1 Act by Does not apply route 3 (three) times daily. 2 each 0  . glucose blood (FREESTYLE LITE) test strip Test up to TID dx  260.62 100 each 12  . sitaGLIPtin-metformin (JANUMET) 50-1000 MG per tablet Take 1 tablet by mouth 2 (two) times daily with a meal. 180 tablet 2  . fluconazole (DIFLUCAN) 150 MG tablet take 1 tablet by mouth as a single dose  0  . Insulin Glargine (TOUJEO SOLOSTAR) 300 UNIT/ML SOPN Inject 50 Units into the skin daily. (Patient not taking: Reported on 09/03/2015) 1.5 mL 11  . triamcinolone cream (KENALOG) 0.1 % APPLY TWICE A DAY TO THE AFFECTED AREAS AS NEEDED NOT TO FACE OR GROIN OR UNDERARMS  0   No facility-administered medications prior to visit.    ROS Review of Systems  Constitutional: Positive for fatigue and unexpected weight change. Negative for fever, chills, diaphoresis and appetite change.  HENT: Negative.  Negative for dental problem, sore throat, tinnitus and voice change.   Eyes: Negative.   Respiratory: Negative.  Negative  for cough, choking, chest tightness, shortness of breath and stridor.   Cardiovascular: Negative.  Negative for chest pain, palpitations and leg swelling.  Gastrointestinal: Negative.  Negative for nausea, vomiting, abdominal pain, diarrhea, constipation and blood in stool.  Endocrine: Negative.  Negative for polydipsia, polyphagia and polyuria.  Genitourinary: Negative.  Negative for urgency, decreased urine volume and difficulty urinating.  Musculoskeletal: Positive for arthralgias. Negative for myalgias, back pain and neck pain.  Skin: Negative.  Negative for rash.  Allergic/Immunologic: Negative.   Neurological: Negative.  Negative for dizziness, tremors, syncope, light-headedness and numbness.  Hematological: Negative.  Negative for adenopathy. Does not bruise/bleed easily.  Psychiatric/Behavioral: Negative.     Objective:  BP 136/82 mmHg  Pulse 100  Temp(Src) 98.6 F (37 C) (Oral)  Resp 20  Ht 5' 3"  (1.6 m)  Wt 238 lb 12 oz (108.296 kg)  BMI 42.30 kg/m2  SpO2 98%  BP Readings from Last 3 Encounters:  09/03/15 136/82  07/11/15 124/72  05/01/15 140/84    Wt Readings from Last 3 Encounters:  09/03/15 238 lb 12 oz (108.296 kg)  07/11/15 245 lb (111.131 kg)  06/27/15 245 lb (111.131 kg)    Physical Exam  Constitutional: She is oriented to person, place, and time. No distress.  HENT:  Head: Normocephalic and atraumatic.  Mouth/Throat: Oropharynx is clear and moist. No oropharyngeal exudate.  Eyes: Conjunctivae are normal. Right eye exhibits no discharge. Left eye exhibits no discharge. No scleral  icterus.  Neck: Normal range of motion. Neck supple. No JVD present. No tracheal deviation present. No thyromegaly present.  Cardiovascular: Normal rate, regular rhythm, normal heart sounds and intact distal pulses.  Exam reveals no gallop and no friction rub.   No murmur heard. Pulmonary/Chest: Effort normal and breath sounds normal. No stridor. No respiratory distress. She has  no wheezes. She has no rales. She exhibits no tenderness.  Abdominal: Soft. Bowel sounds are normal. She exhibits no distension and no mass. There is no tenderness. There is no rebound and no guarding.  Musculoskeletal: Normal range of motion. She exhibits no edema or tenderness.  Lymphadenopathy:    She has no cervical adenopathy.  Neurological: She is oriented to person, place, and time.  Skin: Skin is warm and dry. No rash noted. She is not diaphoretic. No erythema. No pallor.  Vitals reviewed.   Lab Results  Component Value Date   WBC 10.0 03/28/2015   HGB 16.0* 03/28/2015   HCT 47.6* 03/28/2015   PLT 180.0 03/28/2015   GLUCOSE 288* 09/03/2015   CHOL 181 03/28/2015   TRIG 443.0* 09/03/2015   HDL 53.50 03/28/2015   LDLDIRECT 75.0 03/28/2015   LDLCALC UNABLE TO CALCULATE IF TRIGLYCERIDE OVER 400 mg/dL 04/06/2012   ALT 29 03/28/2015   AST 34 03/28/2015   NA 135 09/03/2015   K 4.8 09/03/2015   CL 98 09/03/2015   CREATININE 1.02 09/03/2015   BUN 20 09/03/2015   CO2 26 09/03/2015   TSH 2.32 03/28/2015   INR 0.88 09/03/2012   HGBA1C 10.0* 09/03/2015   MICROALBUR 6.6* 03/28/2015    Dg Chest 2 View  02/13/2014  CLINICAL DATA:  Cough and shortness of Breath EXAM: CHEST  2 VIEW COMPARISON:  08/05/2012 FINDINGS: Cardiac silhouette is normal in size and configuration. Normal mediastinal and hilar contours. Lungs are clear. No pleural effusion. No pneumothorax. Vertebroplasty has been performed in the upper lumbar spine. The bony thorax is demineralized but otherwise intact. IMPRESSION: No active cardiopulmonary disease. Electronically Signed   By: Lajean Manes M.D.   On: 02/13/2014 12:01    Assessment & Plan:   Fahmida was seen today for hyperlipidemia, hypertension and diabetes.  Diagnoses and all orders for this visit:  HTN (hypertension), benign- her blood pressure is well-controlled, like to lites and renal function are stable. -     Basic metabolic panel; Future  Diabetes  mellitus with neurological manifestations, uncontrolled ([place])- her blood sugars are not well-controlled, her therapy is limited by lack of availability and inability to afford most medications. Will hold on Januvia to see if that helps improve her epigastric abdominal pain. Will continue metformin and will add on a sulfonylurea. Will change her basal insulin to NPH insulin twice a day. -     Basic metabolic panel; Future -     Hemoglobin A1c; Future -     metFORMIN (GLUCOPHAGE) 1000 MG tablet; Take 1 tablet (1,000 mg total) by mouth 2 (two) times daily with a meal. -     insulin NPH Human (HUMULIN N) 100 UNIT/ML injection; Inject 0.2 mLs (20 Units total) into the skin 2 (two) times daily before a meal. -     Ambulatory referral to Ophthalmology -     Blood Glucose Monitoring Suppl (ONETOUCH VERIO IQ SYSTEM) W/DEVICE KIT; 1 Act by Does not apply route 3 (three) times daily. -     glucose blood (ONETOUCH VERIO) test strip; Use TID  Pure hyperglyceridemia- her triglycerides have not exceeded 500  so for now will not treat them. She will continue with lifestyle modifications including diet and weight loss. -     Triglycerides; Future  Need for prophylactic measure -     Flu Vaccine QUAD 36+ mos PF IM (Fluarix & Fluzone Quad PF)  Primary osteoarthritis of left knee -     Ambulatory referral to Orthopedic Surgery  I have discontinued Ms. Tiedeman's sitaGLIPtin-metformin, fluconazole, triamcinolone cream, and Insulin Glargine. I am also having her start on metFORMIN, insulin NPH Human, ONETOUCH VERIO IQ SYSTEM, and glucose blood. Additionally, I am having her maintain her FREESTYLE FREEDOM LITE, glucose blood, and Azilsartan Medoxomil.  Meds ordered this encounter  Medications  . metFORMIN (GLUCOPHAGE) 1000 MG tablet    Sig: Take 1 tablet (1,000 mg total) by mouth 2 (two) times daily with a meal.    Dispense:  180 tablet    Refill:  1  . insulin NPH Human (HUMULIN N) 100 UNIT/ML injection    Sig:  Inject 0.2 mLs (20 Units total) into the skin 2 (two) times daily before a meal.    Dispense:  10 mL    Refill:  11  . Blood Glucose Monitoring Suppl (ONETOUCH VERIO IQ SYSTEM) W/DEVICE KIT    Sig: 1 Act by Does not apply route 3 (three) times daily.    Dispense:  2 kit    Refill:  0  . glucose blood (ONETOUCH VERIO) test strip    Sig: Use TID    Dispense:  240 each    Refill:  0     Follow-up: Return in about 4 months (around 01/01/2016).  Scarlette Calico, MD

## 2015-09-03 NOTE — Patient Instructions (Signed)

## 2015-09-03 NOTE — Progress Notes (Signed)
Pre visit review using our clinic review tool, if applicable. No additional management support is needed unless otherwise documented below in the visit note. 

## 2015-09-04 ENCOUNTER — Encounter: Payer: Self-pay | Admitting: Internal Medicine

## 2015-09-04 MED ORDER — GLIMEPIRIDE 2 MG PO TABS: 2.0000 mg | ORAL_TABLET | Freq: Every day | ORAL | Status: DC

## 2015-10-12 ENCOUNTER — Telehealth: Payer: Self-pay | Admitting: Internal Medicine

## 2015-10-12 NOTE — Telephone Encounter (Signed)
Patient Name: Abigail Collins  DOB: 01-04-1951    Initial Comment Caller is diabetic and has blood sugar of 278. The caller is asking how to lower her blood sugar. The caller has a headache.    Nurse Assessment  Nurse: Raphael Gibney, RN, Vanita Ingles Date/Time Eilene Ghazi Time): 10/12/2015 11:58:46 AM  Confirm and document reason for call. If symptomatic, describe symptoms. ---Caller states she is diabetic. Blood sugar was 278 a few min ago. She took a metformin this am and in the pm. she took Glimeperide also this am. She is supposed to take Toujeo shot at 10 am. Blood sugar was 273 yesterday and then it decreased to 175. She ate a taco salad for supper last night. She saw doctor a month ago. She has a headache for 3-4 weeks.  Has the patient traveled out of the country within the last 30 days? ---Not Applicable  Does the patient have any new or worsening symptoms? ---Yes  Will a triage be completed? ---Yes  Related visit to physician within the last 2 weeks? ---No  Does the PT have any chronic conditions? (i.e. diabetes, asthma, etc.) ---Yes  List chronic conditions. ---diabetes, HTN  Is this a behavioral health or substance abuse call? ---No     Guidelines    Guideline Title Affirmed Question Affirmed Notes  Diabetes - High Blood Sugar [1] Blood glucose > 240 mg/dl (13 mmol/l) AND [2] does not use insulin (e.g., not insulin-dependent; most type 2 diabetics) (all triage questions negative)   Headache [1] MILD-MODERATE headache AND [2] present > 72 hours    Final Disposition User   See PCP When Office is Open (within 3 days) Raphael Gibney, RN, Vanita Ingles    Comments  No appt available with Dr. Ronnald Ramp today. Pt does not want to go to [facility] or another office but wants to see Dr. Ronnald Ramp.   Referrals  GO TO FACILITY REFUSED   Disagree/Comply: Comply    Disagree/Comply: Comply

## 2015-10-12 NOTE — Telephone Encounter (Signed)
Please contact pt to make next available appt wit Dr. Ronnald Ramp...Abigail Collins

## 2015-10-12 NOTE — Telephone Encounter (Signed)
appt made

## 2015-10-22 ENCOUNTER — Ambulatory Visit: Payer: Commercial Managed Care - HMO | Admitting: Internal Medicine

## 2015-12-03 ENCOUNTER — Telehealth: Payer: Self-pay | Admitting: Internal Medicine

## 2015-12-03 ENCOUNTER — Encounter (HOSPITAL_COMMUNITY): Payer: Self-pay | Admitting: Emergency Medicine

## 2015-12-03 ENCOUNTER — Emergency Department (HOSPITAL_COMMUNITY): Payer: PPO

## 2015-12-03 ENCOUNTER — Inpatient Hospital Stay (HOSPITAL_COMMUNITY)
Admission: EM | Admit: 2015-12-03 | Discharge: 2015-12-06 | DRG: 202 | Disposition: A | Discharge status: Home / Self Care | Payer: PPO | Attending: Internal Medicine | Admitting: Internal Medicine

## 2015-12-03 DIAGNOSIS — N179 Acute kidney failure, unspecified: Secondary | ICD-10-CM | POA: Diagnosis present

## 2015-12-03 DIAGNOSIS — Z8261 Family history of arthritis: Secondary | ICD-10-CM | POA: Diagnosis not present

## 2015-12-03 DIAGNOSIS — Z888 Allergy status to other drugs, medicaments and biological substances status: Secondary | ICD-10-CM

## 2015-12-03 DIAGNOSIS — Z9109 Other allergy status, other than to drugs and biological substances: Secondary | ICD-10-CM | POA: Diagnosis not present

## 2015-12-03 DIAGNOSIS — Z87891 Personal history of nicotine dependence: Secondary | ICD-10-CM | POA: Diagnosis not present

## 2015-12-03 DIAGNOSIS — Z885 Allergy status to narcotic agent status: Secondary | ICD-10-CM

## 2015-12-03 DIAGNOSIS — Z6841 Body Mass Index (BMI) 40.0 and over, adult: Secondary | ICD-10-CM | POA: Diagnosis not present

## 2015-12-03 DIAGNOSIS — E1165 Type 2 diabetes mellitus with hyperglycemia: Secondary | ICD-10-CM

## 2015-12-03 DIAGNOSIS — N390 Urinary tract infection, site not specified: Secondary | ICD-10-CM | POA: Diagnosis not present

## 2015-12-03 DIAGNOSIS — E860 Dehydration: Secondary | ICD-10-CM | POA: Diagnosis not present

## 2015-12-03 DIAGNOSIS — Z794 Long term (current) use of insulin: Secondary | ICD-10-CM

## 2015-12-03 DIAGNOSIS — J219 Acute bronchiolitis, unspecified: Secondary | ICD-10-CM | POA: Diagnosis not present

## 2015-12-03 DIAGNOSIS — J329 Chronic sinusitis, unspecified: Secondary | ICD-10-CM

## 2015-12-03 DIAGNOSIS — K219 Gastro-esophageal reflux disease without esophagitis: Secondary | ICD-10-CM | POA: Diagnosis not present

## 2015-12-03 DIAGNOSIS — Z833 Family history of diabetes mellitus: Secondary | ICD-10-CM | POA: Diagnosis not present

## 2015-12-03 DIAGNOSIS — G5602 Carpal tunnel syndrome, left upper limb: Secondary | ICD-10-CM | POA: Diagnosis not present

## 2015-12-03 DIAGNOSIS — J45909 Unspecified asthma, uncomplicated: Secondary | ICD-10-CM | POA: Diagnosis present

## 2015-12-03 DIAGNOSIS — R0600 Dyspnea, unspecified: Secondary | ICD-10-CM | POA: Diagnosis not present

## 2015-12-03 DIAGNOSIS — Z8249 Family history of ischemic heart disease and other diseases of the circulatory system: Secondary | ICD-10-CM | POA: Diagnosis not present

## 2015-12-03 DIAGNOSIS — Z79899 Other long term (current) drug therapy: Secondary | ICD-10-CM

## 2015-12-03 DIAGNOSIS — E1149 Type 2 diabetes mellitus with other diabetic neurological complication: Secondary | ICD-10-CM

## 2015-12-03 DIAGNOSIS — E785 Hyperlipidemia, unspecified: Secondary | ICD-10-CM | POA: Diagnosis not present

## 2015-12-03 DIAGNOSIS — E119 Type 2 diabetes mellitus without complications: Secondary | ICD-10-CM | POA: Diagnosis not present

## 2015-12-03 DIAGNOSIS — F329 Major depressive disorder, single episode, unspecified: Secondary | ICD-10-CM | POA: Diagnosis present

## 2015-12-03 DIAGNOSIS — R042 Hemoptysis: Secondary | ICD-10-CM | POA: Diagnosis not present

## 2015-12-03 DIAGNOSIS — A419 Sepsis, unspecified organism: Secondary | ICD-10-CM | POA: Diagnosis not present

## 2015-12-03 DIAGNOSIS — R05 Cough: Secondary | ICD-10-CM

## 2015-12-03 DIAGNOSIS — I10 Essential (primary) hypertension: Secondary | ICD-10-CM

## 2015-12-03 DIAGNOSIS — M1990 Unspecified osteoarthritis, unspecified site: Secondary | ICD-10-CM | POA: Diagnosis present

## 2015-12-03 DIAGNOSIS — R059 Cough, unspecified: Secondary | ICD-10-CM

## 2015-12-03 DIAGNOSIS — IMO0002 Reserved for concepts with insufficient information to code with codable children: Secondary | ICD-10-CM | POA: Diagnosis present

## 2015-12-03 DIAGNOSIS — R0602 Shortness of breath: Secondary | ICD-10-CM | POA: Diagnosis not present

## 2015-12-03 LAB — CBC WITH DIFFERENTIAL/PLATELET
Basophils Absolute: 0 10*3/uL (ref 0.0–0.1)
Basophils Relative: 0 %
Eosinophils Absolute: 0.3 10*3/uL (ref 0.0–0.7)
Eosinophils Relative: 3 %
HCT: 46.2 % — ABNORMAL HIGH (ref 36.0–46.0)
Hemoglobin: 14.2 g/dL (ref 12.0–15.0)
Lymphocytes Relative: 46 %
Lymphs Abs: 5.5 10*3/uL — ABNORMAL HIGH (ref 0.7–4.0)
MCH: 29.8 pg (ref 26.0–34.0)
MCHC: 30.7 g/dL (ref 30.0–36.0)
MCV: 96.9 fL (ref 78.0–100.0)
Monocytes Absolute: 0.5 10*3/uL (ref 0.1–1.0)
Monocytes Relative: 5 %
Neutro Abs: 5.4 10*3/uL (ref 1.7–7.7)
Neutrophils Relative %: 46 %
Platelets: 211 10*3/uL (ref 150–400)
RBC: 4.77 MIL/uL (ref 3.87–5.11)
RDW: 13.4 % (ref 11.5–15.5)
WBC: 11.7 10*3/uL — ABNORMAL HIGH (ref 4.0–10.5)

## 2015-12-03 LAB — I-STAT TROPONIN, ED: Troponin i, poc: 0 ng/mL (ref 0.00–0.08)

## 2015-12-03 LAB — I-STAT CG4 LACTIC ACID, ED
Lactic Acid, Venous: 3.44 mmol/L (ref 0.5–2.0)
Lactic Acid, Venous: 3.26 mmol/L (ref 0.5–2.0)

## 2015-12-03 LAB — URINALYSIS, ROUTINE W REFLEX MICROSCOPIC
Bilirubin Urine: NEGATIVE
Glucose, UA: NEGATIVE mg/dL
Hgb urine dipstick: NEGATIVE
Ketones, ur: NEGATIVE mg/dL
Leukocytes, UA: NEGATIVE
Nitrite: NEGATIVE
Protein, ur: NEGATIVE mg/dL
Specific Gravity, Urine: 1.019 (ref 1.005–1.030)
pH: 5.5 (ref 5.0–8.0)

## 2015-12-03 LAB — COMPREHENSIVE METABOLIC PANEL
ALT: 24 U/L (ref 14–54)
AST: 34 U/L (ref 15–41)
Albumin: 4.1 g/dL (ref 3.5–5.0)
Alkaline Phosphatase: 87 U/L (ref 38–126)
Anion gap: 12 (ref 5–15)
BUN: 14 mg/dL (ref 6–20)
CO2: 24 mmol/L (ref 22–32)
Calcium: 9.3 mg/dL (ref 8.9–10.3)
Chloride: 104 mmol/L (ref 101–111)
Creatinine, Ser: 0.84 mg/dL (ref 0.44–1.00)
GFR calc Af Amer: >60 mL/min (ref >60)
GFR calc non Af Amer: >60 mL/min (ref >60)
Glucose, Bld: 206 mg/dL — ABNORMAL HIGH (ref 65–99)
Potassium: 3.6 mmol/L (ref 3.5–5.1)
Sodium: 140 mmol/L (ref 135–145)
Total Bilirubin: 0.7 mg/dL (ref 0.3–1.2)
Total Protein: 7.6 g/dL (ref 6.5–8.1)

## 2015-12-03 LAB — TROPONIN I
Troponin I: <0.03 ng/mL (ref <0.031)
Troponin I: <0.03 ng/mL (ref <0.031)

## 2015-12-03 LAB — CBG MONITORING, ED
Glucose-Capillary: 136 mg/dL — ABNORMAL HIGH (ref 65–99)
Glucose-Capillary: 339 mg/dL — ABNORMAL HIGH (ref 65–99)

## 2015-12-03 LAB — PROCALCITONIN: Procalcitonin: <0.1 ng/mL

## 2015-12-03 LAB — INFLUENZA PANEL BY PCR (TYPE A & B)
H1N1 flu by pcr: NOT DETECTED
Influenza A By PCR: NEGATIVE
Influenza B By PCR: NEGATIVE

## 2015-12-03 LAB — PHOSPHORUS: Phosphorus: 2.4 mg/dL — ABNORMAL LOW (ref 2.5–4.6)

## 2015-12-03 LAB — MAGNESIUM: Magnesium: 1.6 mg/dL — ABNORMAL LOW (ref 1.7–2.4)

## 2015-12-03 MED ORDER — INSULIN NPH (HUMAN) (ISOPHANE) 100 UNIT/ML ~~LOC~~ SUSP
20.0000 [IU] | Freq: Two times a day (BID) | SUBCUTANEOUS | Status: DC
  Administered 2015-12-03 – 2015-12-06 (×6): 20 [IU] via SUBCUTANEOUS
  Filled 2015-12-03 (×2): qty 10

## 2015-12-03 MED ORDER — DEXTROSE 5 % IV SOLN
500.0000 mg | Freq: Once | INTRAVENOUS | Status: AC
  Administered 2015-12-03: 500 mg via INTRAVENOUS
  Filled 2015-12-03: qty 500

## 2015-12-03 MED ORDER — PANTOPRAZOLE SODIUM 40 MG PO TBEC
40.0000 mg | DELAYED_RELEASE_TABLET | Freq: Every day | ORAL | Status: DC
  Administered 2015-12-03 – 2015-12-06 (×4): 40 mg via ORAL
  Filled 2015-12-03 (×4): qty 1

## 2015-12-03 MED ORDER — INSULIN ASPART 100 UNIT/ML ~~LOC~~ SOLN
0.0000 [IU] | Freq: Three times a day (TID) | SUBCUTANEOUS | Status: DC
  Administered 2015-12-04: 3 [IU] via SUBCUTANEOUS
  Administered 2015-12-04: 5 [IU] via SUBCUTANEOUS
  Administered 2015-12-04: 7 [IU] via SUBCUTANEOUS
  Administered 2015-12-05: 9 [IU] via SUBCUTANEOUS
  Administered 2015-12-05 – 2015-12-06 (×2): 5 [IU] via SUBCUTANEOUS
  Filled 2015-12-03 (×2): qty 1

## 2015-12-03 MED ORDER — SODIUM CHLORIDE 0.9 % IV BOLUS (SEPSIS)
1000.0000 mL | INTRAVENOUS | Status: AC
  Administered 2015-12-03 (×3): 1000 mL via INTRAVENOUS

## 2015-12-03 MED ORDER — ONDANSETRON HCL 4 MG PO TABS: 4.0000 mg | ORAL_TABLET | Freq: Four times a day (QID) | ORAL | Status: DC | PRN

## 2015-12-03 MED ORDER — ONDANSETRON HCL 4 MG/2ML IJ SOLN
4.0000 mg | Freq: Four times a day (QID) | INTRAMUSCULAR | Status: DC | PRN
  Administered 2015-12-04: 4 mg via INTRAVENOUS
  Filled 2015-12-03: qty 2

## 2015-12-03 MED ORDER — PREDNISONE 20 MG PO TABS
40.0000 mg | ORAL_TABLET | Freq: Every day | ORAL | Status: DC
  Administered 2015-12-03 – 2015-12-05 (×3): 40 mg via ORAL
  Filled 2015-12-03 (×3): qty 2

## 2015-12-03 MED ORDER — INSULIN GLARGINE 300 UNIT/ML ~~LOC~~ SOPN: 50.0000 [IU] | PEN_INJECTOR | Freq: Every day | SUBCUTANEOUS | Status: DC

## 2015-12-03 MED ORDER — DEXTROSE 5 % IV SOLN
1.0000 g | Freq: Once | INTRAVENOUS | Status: AC
  Administered 2015-12-03: 1 g via INTRAVENOUS
  Filled 2015-12-03: qty 10

## 2015-12-03 MED ORDER — ENOXAPARIN SODIUM 60 MG/0.6ML ~~LOC~~ SOLN
50.0000 mg | SUBCUTANEOUS | Status: DC
  Administered 2015-12-03 – 2015-12-05 (×3): 50 mg via SUBCUTANEOUS
  Filled 2015-12-03 (×5): qty 0.6

## 2015-12-03 MED ORDER — MAGNESIUM SULFATE 2 GM/50ML IV SOLN
2.0000 g | Freq: Once | INTRAVENOUS | Status: AC
  Administered 2015-12-03: 2 g via INTRAVENOUS
  Filled 2015-12-03: qty 50

## 2015-12-03 MED ORDER — IPRATROPIUM-ALBUTEROL 0.5-2.5 (3) MG/3ML IN SOLN
3.0000 mL | Freq: Once | RESPIRATORY_TRACT | Status: AC
  Administered 2015-12-03: 3 mL via RESPIRATORY_TRACT
  Filled 2015-12-03: qty 3

## 2015-12-03 MED ORDER — DOCUSATE SODIUM 100 MG PO CAPS
100.0000 mg | ORAL_CAPSULE | Freq: Two times a day (BID) | ORAL | Status: DC
  Administered 2015-12-03 – 2015-12-06 (×6): 100 mg via ORAL
  Filled 2015-12-03 (×6): qty 1

## 2015-12-03 MED ORDER — GUAIFENESIN-DM 100-10 MG/5ML PO SYRP
5.0000 mL | ORAL_SOLUTION | ORAL | Status: DC | PRN
  Administered 2015-12-03 (×2): 5 mL via ORAL
  Filled 2015-12-03 (×2): qty 10

## 2015-12-03 MED ORDER — SODIUM CHLORIDE 0.9 % IV BOLUS (SEPSIS)
500.0000 mL | INTRAVENOUS | Status: AC
  Administered 2015-12-03: 500 mL via INTRAVENOUS

## 2015-12-03 MED ORDER — ALBUTEROL SULFATE (2.5 MG/3ML) 0.083% IN NEBU
5.0000 mg | INHALATION_SOLUTION | Freq: Once | RESPIRATORY_TRACT | Status: AC
  Administered 2015-12-03: 5 mg via RESPIRATORY_TRACT
  Filled 2015-12-03: qty 6

## 2015-12-03 MED ORDER — LEVALBUTEROL HCL 0.63 MG/3ML IN NEBU
0.6300 mg | INHALATION_SOLUTION | Freq: Four times a day (QID) | RESPIRATORY_TRACT | Status: DC | PRN
  Administered 2015-12-04: 0.63 mg via RESPIRATORY_TRACT
  Filled 2015-12-03: qty 3

## 2015-12-03 MED ORDER — INSULIN REGULAR BOLUS VIA INFUSION
0.0000 [IU] | Freq: Three times a day (TID) | INTRAVENOUS | Status: DC
  Filled 2015-12-03: qty 10

## 2015-12-03 MED ORDER — IOHEXOL 350 MG/ML SOLN
100.0000 mL | Freq: Once | INTRAVENOUS | Status: AC | PRN
  Administered 2015-12-03: 100 mL via INTRAVENOUS

## 2015-12-03 MED ORDER — INSULIN GLARGINE 100 UNIT/ML ~~LOC~~ SOLN
50.0000 [IU] | Freq: Every day | SUBCUTANEOUS | Status: DC
  Administered 2015-12-03 – 2015-12-05 (×3): 50 [IU] via SUBCUTANEOUS
  Filled 2015-12-03 (×4): qty 0.5

## 2015-12-03 MED ORDER — DEXTROSE 5 % IV SOLN
1.0000 g | INTRAVENOUS | Status: DC
  Administered 2015-12-04 – 2015-12-05 (×2): 1 g via INTRAVENOUS
  Filled 2015-12-03 (×3): qty 10

## 2015-12-03 MED ORDER — SODIUM CHLORIDE 0.9 % IV SOLN: INTRAVENOUS | Status: DC

## 2015-12-03 MED ORDER — SODIUM CHLORIDE 0.9 % IV SOLN
INTRAVENOUS | Status: DC
  Filled 2015-12-03: qty 2.5

## 2015-12-03 MED ORDER — SODIUM CHLORIDE 0.9% FLUSH
3.0000 mL | Freq: Two times a day (BID) | INTRAVENOUS | Status: DC
  Administered 2015-12-03 – 2015-12-06 (×5): 3 mL via INTRAVENOUS

## 2015-12-03 MED ORDER — HYDROMORPHONE HCL 1 MG/ML IJ SOLN
0.5000 mg | INTRAMUSCULAR | Status: DC | PRN
  Administered 2015-12-04: 0.5 mg via INTRAVENOUS
  Filled 2015-12-03 (×2): qty 1

## 2015-12-03 MED ORDER — DEXTROSE-NACL 5-0.45 % IV SOLN
INTRAVENOUS | Status: DC
  Administered 2015-12-03: 19:00:00 via INTRAVENOUS

## 2015-12-03 MED ORDER — DEXTROSE 5 % IV SOLN
500.0000 mg | INTRAVENOUS | Status: DC
  Administered 2015-12-04 – 2015-12-05 (×2): 500 mg via INTRAVENOUS
  Filled 2015-12-03 (×3): qty 500

## 2015-12-03 MED ORDER — DEXTROSE 50 % IV SOLN: 25.0000 mL | INTRAVENOUS | Status: DC | PRN

## 2015-12-03 NOTE — ED Notes (Signed)
Pt reports worsening productive cough, SOB, and central chest pain for 2 weeks.

## 2015-12-03 NOTE — Telephone Encounter (Signed)
[facility] Patient Name: Abigail Collins DOB: [birth year] Initial Comment caller states she is is having trouble breathing hurting in her chest, cough, headache and spitting up yellow mucus w/blood Nurse Assessment Nurse: Markus Daft, RN, Sherre Poot Date/Time (Eastern Time): 12/03/2015 8:27:15 AM Confirm and document reason for call. If symptomatic, describe symptoms. You must click the next button to save text entered. ---Caller states that she has had cough with yellow blood tinged mucous with nasal stuffiness. Started Wednesday about 10 days ago. She c/o headache, chest pain and c/o difficulty breathing. She reports feeling very weak, uses walker and still able to walk. Her blood sugars are elevated. Last night blood sugar was 204. Blood sugar today is 272 after having breakfast of smoked sausage 15 - 20 min. ago. Has the patient traveled out of the country within the last 30 days? ---No Does the patient have any new or worsening symptoms? ---Yes Will a triage be completed? ---Yes Related visit to physician within the last 2 weeks? ---No Does the PT have any chronic conditions? (i.e. diabetes, asthma, etc.) ---Yes List chronic conditions. ---HTN, IDDM Is this a behavioral health or substance abuse call? ---No Guidelines Guideline Title Affirmed Question Affirmed Notes Cough - Acute Productive Difficulty breathing Final Disposition User Go to ED Now Markus Daft, RN, Sherre Poot Comments She has a wheeze. Uses Proventil inhaler - last used last night, and does not feel it is helping. Uses inhaler PRN. Inhaler is out of date (expired 06/29/15) Denies h/o COPD or asthma. Referrals [facility] - ED Disagree/Comply: Comply

## 2015-12-03 NOTE — Progress Notes (Signed)
Pharmacy Antibiotic Note  Abigail Collins is a 65 y.o. female admitted on 12/03/2015 with c/o cough and SOB .  Pharmacy has been consulted for azithromycin and ceftriaxone dosing suspected CAP.  Plan: - azithromycin 500 mg IV q24h - ceftriaxone 1gm IV q24h - pharmacy will sign off since no renal adjustment is needed for the above two abx. - re-consult [place] if need further assistance     Temp (24hrs), Avg:97.6 F (36.4 C), Min:97.6 F (36.4 C), Max:97.6 F (36.4 C)   Recent Labs Lab 12/03/15 1156 12/03/15 1204  CREATININE 0.84  --   LATICACIDVEN  --  3.44*    CrCl cannot be calculated (Unknown ideal weight.).    Allergies  Allergen Reactions  . Lisinopril Shortness Of Breath  . Amlodipine     edema  . Tape Other (See Comments)    Tore skin--?hypofix  . Percocet [Oxycodone-Acetaminophen] Nausea And Vomiting     Thank you for allowing pharmacy to be a part of this patient's care.  Lynelle Doctor 12/03/2015 1:17 PM

## 2015-12-03 NOTE — H&P (Addendum)
[facility] History and Physical  ARNOLA CRITTENDON LGX:[id number] DOB: [birth year] DOA: 12/03/2015  Referring physician:    PCP: Scarlette Calico, MD   Chief Complaint: Shortness of breath HPI:  65 year old female with a history of diabetes, insulin-dependent, asthma, thyroid disease, remote history of smoking half a pack a day, quit 10 years ago who presents to the ER with difficulty breathing. Patient has been ill for the last 2 weeks. Initially started having rhinorrhea associated with sore throat and a nonproductive cough. Cough became productive this morning and was blood-tinged. She saw her PCP today who sent her to the ER for further evaluation. She has been using over-the-counter medications including Robitussin-DM and ibuprofen. She states that her sugar has been running high in the 400s. She also states that she has had low-grade fever with chills for the last 2 weeks. No diarrhea. She started developing chest tightness associated with coughing this morning. In the ER she is found to be tachycardic with heart rate in the 120s. CT of the chest did not show PE. Mild bronchial wall thickening. Patient started on treatment for acute bronchitis.      Review of Systems: negative for the following  Constitutional: Positive for fever, chills and diaphoresis (especially at night).  HEENT: Denies photophobia, eye pain, redness, hearing loss, ear pain, congestion, sore throat, rhinorrhea, sneezing, mouth sores, trouble swallowing, neck pain, neck stiffness and tinnitus.  Respiratory: Positive for cough and shortness of breath Cardiovascular: Positive for chest pain.  Gastrointestinal: Denies nausea, vomiting, abdominal pain, diarrhea, constipation, blood in stool and abdominal distention.  Genitourinary: Denies dysuria, urgency, frequency, hematuria, flank pain and difficulty urinating.  Musculoskeletal: Denies myalgias, back pain, joint swelling, arthralgias and gait problem.  Skin: Denies  pallor, rash and wound.  Neurological: Denies dizziness, seizures, syncope, weakness, light-headedness, numbness and headaches.  Hematological: Denies adenopathy. Easy bruising, personal or family bleeding history  Psychiatric/Behavioral: Denies suicidal ideation, mood changes, confusion, nervousness, sleep disturbance and agitation       Past Medical History  Diagnosis Date  . Thyroid disease 1960's    "don't have it now" (09/14/2012)  . Bulging disc     "lower"  . PONV (postoperative nausea and vomiting)   . Anginal pain ([place])     "left arm pain, sees Dr. Etter Sjogren, had card cath 2013"  . Hyperlipidemia   . Hypertension     sees Dr. Debby Freiberg, primary  . Asthma     takes inhaler 2x day  . Bronchitis     hx of  . Urinary tract infection     hx of  . Anemia     hx of  . Exertional dyspnea     "sometimes laying down" (09/14/2012)  . Type II diabetes mellitus ([place])   . Carpal tunnel syndrome of left wrist   . Arthritis     "all over" (09/14/2012)  . Pneumonia 03/2012  . Vomiting     pt states she vomits every am  . Depression     denies     Past Surgical History  Procedure Laterality Date  . Cardiac catheterization  05/13/2012    mod luminal irregularity of pLAD, no sign CAD, EF 65%.  . Total knee arthroplasty  09/13/2012    Procedure: TOTAL KNEE ARTHROPLASTY;  Surgeon: Vickey Huger, MD;  Location: [place];  Service: Orthopedics;  Laterality: Right;  . Vaginal hysterectomy  1970's  . Cholecystectomy  1970's  . Tubal ligation  1970's  . Total elbow replacement  ~  2005    "right" (09/14/2012)  . Kyphoplasty N/A 02/10/2013    Procedure:  LUMBAR TWO KYPHOPLASTY;  Surgeon: Ophelia Charter, MD;  Location: [facility];  Service: Neurosurgery;  Laterality: N/A;  L2 Kyphoplasty; Will use Stern's Carm.      Social History:  reports that she quit smoking about 6 years ago. Her smoking use included Cigarettes. She has a 5 pack-year smoking history. She has never used smokeless  tobacco. She reports that she does not drink alcohol or use illicit drugs.    Allergies  Allergen Reactions  . Lisinopril Shortness Of Breath  . Amlodipine     edema  . Tape Other (See Comments)    Tore skin--?hypofix  . Percocet [Oxycodone-Acetaminophen] Nausea And Vomiting    Family History  Problem Relation Age of Onset  . Arthritis Mother   . Arthritis Father   . Hypertension Father   . Diabetes Father   . Colon cancer Neg Hx         Prior to Admission medications   Medication Sig Start Date End Date Taking? Authorizing Provider  albuterol (PROVENTIL) (2.5 MG/3ML) 0.083% nebulizer solution Inhale 2.5 mg into the lungs every 6 (six) hours as needed for wheezing or shortness of breath.    Yes Historical Provider, MD  Azilsartan Medoxomil (EDARBI) 80 MG TABS Take 1 tablet (80 mg total) by mouth daily. 03/28/15  Yes Janith Lima, MD  glimepiride (AMARYL) 2 MG tablet Take 1 tablet (2 mg total) by mouth daily before breakfast. 09/04/15  Yes Janith Lima, MD  guaiFENesin-dextromethorphan (ROBITUSSIN DM) 100-10 MG/5ML syrup Take 15 mLs by mouth every 4 (four) hours as needed for cough. Sugar free   Yes Historical Provider, MD  ibuprofen (ADVIL,MOTRIN) 200 MG tablet Take 400 mg by mouth every 6 (six) hours as needed for headache or moderate pain.   Yes Historical Provider, MD  metFORMIN (GLUCOPHAGE) 1000 MG tablet Take 1 tablet (1,000 mg total) by mouth 2 (two) times daily with a meal. 09/03/15  Yes Janith Lima, MD  Pseudoephedrine-Ibuprofen (ADVIL COLD & SINUS LIQUI-GELS) 30-200 MG CAPS Take 1 capsule by mouth 2 (two) times daily as needed (cold symptoms).   Yes Historical Provider, MD  TOUJEO SOLOSTAR 300 UNIT/ML SOPN Inject 50 Units into the skin at bedtime. 11/22/15  Yes Historical Provider, MD  Blood Glucose Monitoring Suppl (FREESTYLE FREEDOM LITE) W/DEVICE KIT 1 Act by Does not apply route 3 (three) times daily. 12/22/13   Janith Lima, MD  Blood Glucose Monitoring Suppl  (ONETOUCH VERIO IQ SYSTEM) W/DEVICE KIT 1 Act by Does not apply route 3 (three) times daily. 09/03/15   Janith Lima, MD  glucose blood (FREESTYLE LITE) test strip Test up to TID dx  260.62 12/23/13   Janith Lima, MD  glucose blood [facility] VERIO) test strip Use TID 09/03/15   Janith Lima, MD  insulin NPH Human (HUMULIN N) 100 UNIT/ML injection Inject 0.2 mLs (20 Units total) into the skin 2 (two) times daily before a meal. 09/03/15   Janith Lima, MD     Physical Exam: Filed Vitals:   12/03/15 1348 12/03/15 1400 12/03/15 1402 12/03/15 1600  BP: 127/82 132/81  126/90  Pulse: 107 115  115  Temp:    98 F (36.7 C)  TempSrc:    Oral  Resp: _0 Height:   _1  (1.626 m)   Weight:   107.049 kg (236 lb)  SpO2: 91% 92%  97%     Constitutional: Vital signs reviewed. Patient is a well-developed and well-nourished in no acute distress and cooperative with exam. Alert and oriented x3.  Head: Normocephalic and atraumatic  Ear: TM normal bilaterally  Mouth: no erythema or exudates, MMM  Eyes: PERRL, EOMI, conjunctivae normal, No scleral icterus.  Neck: Supple, Trachea midline normal ROM, No JVD, mass, thyromegaly, or carotid bruit present.  Cardiovascular: Regular rhythm and normal heart sounds. Tachycardia present. Pulmonary/Chest: CTAB, no wheezes, rales, or rhonchi  Abdominal: Soft. Non-tender, non-distended, bowel sounds are normal, no masses, organomegaly, or guarding present.  GU: no CVA tenderness Musculoskeletal: No joint deformities, erythema, or stiffness, ROM full and no nontender Ext: no edema and no cyanosis, pulses palpable bilaterally (DP and PT)  Hematology: no cervical, inginal, or axillary adenopathy.  Neurological: A&O x3, Strenght is normal and symmetric bilaterally, cranial nerve II-XII are grossly intact, no focal motor deficit, sensory intact to light touch bilaterally.  Skin: Warm, dry and intact. No rash, cyanosis, or clubbing.  Psychiatric: Normal  mood and affect. speech and behavior is normal. Judgment and thought content normal. Cognition and memory are normal.      Data Review   Micro Results No results found for this or any previous visit (from the past 240 hour(s)).  Radiology Reports Dg Chest 2 View  12/03/2015  CLINICAL DATA:  Worsening cough, congestion, shortness of breath, chest pain for 2 weeks EXAM: CHEST  2 VIEW COMPARISON:  02/13/2014 FINDINGS: Cardiomediastinal silhouette is unremarkable. No acute infiltrate or pleural effusion. No pulmonary edema. Prior vertebroplasty noted mid lumbar spine. IMPRESSION: No active cardiopulmonary disease. Electronically Signed   By: Lahoma Crocker M.D.   On: 12/03/2015 12:42   Ct Angio Chest Pe W/cm &/or Wo Cm  12/03/2015  CLINICAL DATA:  Hemoptysis. Dyspnea and cough. Mid chest pain for 2 weeks. Low-grade fever. EXAM: CT ANGIOGRAPHY CHEST WITH CONTRAST TECHNIQUE: Multidetector CT imaging of the chest was performed using the standard protocol during bolus administration of intravenous contrast. Multiplanar CT image reconstructions and MIPs were obtained to evaluate the vascular anatomy. CONTRAST:  136m OMNIPAQUE IOHEXOL 350 MG/ML SOLN COMPARISON:  Chest radiographs earlier today FINDINGS: Pulmonary arterial opacification is adequate without filling does defects seen to indicate emboli. LAD coronary artery calcification is noted. The heart is normal in size. No enlarged axillary, mediastinal, or hilar lymph nodes are identified. There is no pleural or pericardial effusion. Major airways are patent. Mild bronchial wall thickening is questioned. Evaluation of the lung parenchyma is mildly limited by respiratory motion artifact. No airspace consolidation or lung nodules are identified. Minimal dependent atelectasis is noted bilaterally. The visualized portion of the upper abdomen is unremarkable. Scattered, small Schmorl's nodes are noted in the thoracic spine. Review of the MIP images confirms the  above findings. IMPRESSION: No evidence of pulmonary emboli. Electronically Signed   By: ALogan BoresM.D.   On: 12/03/2015 14:59     CBC  Recent Labs Lab 12/03/15 1200  WBC 11.7*  HGB 14.2  HCT 46.2*  PLT 211  MCV 96.9  MCH 29.8  MCHC 30.7  RDW 13.4  LYMPHSABS 5.5*  MONOABS 0.5  EOSABS 0.3  BASOSABS 0.0    Chemistries   Recent Labs Lab 12/03/15 1156  NA 140  K 3.6  CL 104  CO2 24  GLUCOSE 206*  BUN 14  CREATININE 0.84  CALCIUM 9.3  AST 34  ALT 24  ALKPHOS 87  BILITOT 0.7   ------------------------------------------------------------------------------------------------------------------  estimated creatinine clearance is 81.8 mL/min (by C-G formula based on Cr of 0.84). ------------------------------------------------------------------------------------------------------------------ No results for input(s): HGBA1C in the last 72 hours. ------------------------------------------------------------------------------------------------------------------ No results for input(s): CHOL, HDL, LDLCALC, TRIG, CHOLHDL, LDLDIRECT in the last 72 hours. ------------------------------------------------------------------------------------------------------------------ No results for input(s): TSH, T4TOTAL, T3FREE, THYROIDAB in the last 72 hours.  Invalid input(s): FREET3 ------------------------------------------------------------------------------------------------------------------ No results for input(s): VITAMINB12, FOLATE, FERRITIN, TIBC, IRON, RETICCTPCT in the last 72 hours.  Coagulation profile No results for input(s): INR, PROTIME in the last 168 hours.  No results for input(s): DDIMER in the last 72 hours.  Cardiac Enzymes No results for input(s): CKMB, TROPONINI, MYOGLOBIN in the last 168 hours.  Invalid input(s): CK ------------------------------------------------------------------------------------------------------------------ Invalid input(s):  POCBNP   CBG: No results for input(s): GLUCAP in the last 168 hours.     EKG: Independently reviewed. *  Ventricular Rate: 110 PR Interval: 157 QRS Duration: 80 QT Interval: 330 QTC Calculation: 446 R Axis: -40 Text Interpretation: Sinus tachycardia Inferior infarct, old Anterior  infarct, old rate is faster compared to 2014   Assessment/Plan Principal Problem:   Acute viral bronchitis with bronchospasm with sepsis without septic shock Patient admitted primarily secondary to tachycardia, elevated lactate of 3.44 Patient afebrile, tachycardia likely related to nebulizer treatments v dehydration Check influenza PCR, rapid strep A, respiratory virus panel Started on Rocephin/azithromycin for acute bronchitis, no pneumonia Continue nebulizer treatments, Robitussin-DM Prednisone 40 mg a day with taper Start patient on sepsis protocol     Diabetes mellitutype II, insulin-dependent , uncontrolled ([place])  cbg 206 when the patient arrived Continue SSI, lantus  And check hemoglobin A1c, Accu-Cheks per protocol Hold Amaryl, metformin,     HTN (hypertension), benign-blood pressure in the 188Q systolic currently, continue to monitor    GERD (gastroesophageal reflux disease) continue PPI    Morbid obesity (HCC) Body mass index is 40.49 kg/(m^2).         Code Status Orders        Start     Ordered   12/03/15 1554  Full code   Continuous     12/03/15 1557    Code Status History    Date Active Date Inactive Code Status Order ID Comments User Context   02/10/2013 11:00 AM 02/11/2013  2:23 PM Full Code [id number]  Ophelia Charter, MD Inpatient      Family Communication: bedside Disposition Plan: admit   Total time spent 55 minutes.Greater than 50% of this time was spent in counseling, explanation of diagnosis, planning of further management, and coordination of care  [facility] Pager [phone]  If 7PM-7AM, please contact  night-coverage [email or web address] Password [facility] 12/03/2015, 4:22 PM

## 2015-12-03 NOTE — ED Provider Notes (Signed)
CSN: [id number]     Arrival date & time 12/03/15  1020 History   First MD Initiated Contact with Patient 12/03/15 1301     Chief Complaint  Patient presents with  . Shortness of Breath  . Cough     (Consider location/radiation/quality/duration/timing/severity/associated sxs/prior Treatment) HPI  65 year old female presents with feeling ill for the last 2 weeks. States it started as rhinorrhea and then a sore throat and then has been coughing. For the last 1 week she has been coughing up blood-tinged sputum. Has also been feeling short of breath. Having associated headaches, chest pain. Last week had blood in her stool but states there has been none over the past 7 days. No vomiting or hematemesis. Has been having fevers last 3 or 4 days, these are tactile. Last had a fever this morning. No urinary symptoms. Denies smoking history of history of lung disease.  Past Medical History  Diagnosis Date  . Thyroid disease 1960's    "don't have it now" (09/14/2012)  . Bulging disc     "lower"  . PONV (postoperative nausea and vomiting)   . Anginal pain ([place])     "left arm pain, sees Dr. Etter Sjogren, had card cath 2013"  . Hyperlipidemia   . Hypertension     sees Dr. Debby Freiberg, primary  . Asthma     takes inhaler 2x day  . Bronchitis     hx of  . Urinary tract infection     hx of  . Anemia     hx of  . Exertional dyspnea     "sometimes laying down" (09/14/2012)  . Type II diabetes mellitus ([place])   . Carpal tunnel syndrome of left wrist   . Arthritis     "all over" (09/14/2012)  . Pneumonia 03/2012  . Vomiting     pt states she vomits every am  . Depression     denies   Past Surgical History  Procedure Laterality Date  . Cardiac catheterization  05/13/2012    mod luminal irregularity of pLAD, no sign CAD, EF 65%.  . Total knee arthroplasty  09/13/2012    Procedure: TOTAL KNEE ARTHROPLASTY;  Surgeon: Vickey Huger, MD;  Location: [facility];  Service: Orthopedics;  Laterality: Right;  .  Vaginal hysterectomy  1970's  . Cholecystectomy  1970's  . Tubal ligation  1970's  . Total elbow replacement  ~ 2005    "right" (09/14/2012)  . Kyphoplasty N/A 02/10/2013    Procedure:  LUMBAR TWO KYPHOPLASTY;  Surgeon: Ophelia Charter, MD;  Location: [facility];  Service: Neurosurgery;  Laterality: N/A;  L2 Kyphoplasty; Will use Stern's Carm.   Family History  Problem Relation Age of Onset  . Arthritis Mother   . Arthritis Father   . Hypertension Father   . Diabetes Father   . Colon cancer Neg Hx    Social History  Substance Use Topics  . Smoking status: Former Smoker -- 0.25 packs/day for 20 years    Types: Cigarettes    Quit date: 10/13/2009  . Smokeless tobacco: Never Used  . Alcohol Use: No     Comment: 09/14/2012 "did drink a little in my younger days"   OB History    No data available     Review of Systems  Constitutional: Positive for fever, chills and diaphoresis (especially at night).  Respiratory: Positive for cough and shortness of breath.   Cardiovascular: Positive for chest pain.  Gastrointestinal: Negative for vomiting and abdominal pain.  Genitourinary: Negative for dysuria and hematuria.  All other systems reviewed and are negative.     Allergies  Lisinopril; Amlodipine; Tape; and Percocet  Home Medications   Prior to Admission medications   Medication Sig Start Date End Date Taking? Authorizing Provider  Azilsartan Medoxomil (EDARBI) 80 MG TABS Take 1 tablet (80 mg total) by mouth daily. 03/28/15   Janith Lima, MD  Blood Glucose Monitoring Suppl (FREESTYLE FREEDOM LITE) W/DEVICE KIT 1 Act by Does not apply route 3 (three) times daily. 12/22/13   Janith Lima, MD  Blood Glucose Monitoring Suppl (ONETOUCH VERIO IQ SYSTEM) W/DEVICE KIT 1 Act by Does not apply route 3 (three) times daily. 09/03/15   Janith Lima, MD  glimepiride (AMARYL) 2 MG tablet Take 1 tablet (2 mg total) by mouth daily before breakfast. 09/04/15   Janith Lima, MD  glucose  blood (FREESTYLE LITE) test strip Test up to TID dx  260.62 12/23/13   Janith Lima, MD  glucose blood (ONETOUCH VERIO) test strip Use TID 09/03/15   Janith Lima, MD  insulin NPH Human (HUMULIN N) 100 UNIT/ML injection Inject 0.2 mLs (20 Units total) into the skin 2 (two) times daily before a meal. 09/03/15   Janith Lima, MD  metFORMIN (GLUCOPHAGE) 1000 MG tablet Take 1 tablet (1,000 mg total) by mouth 2 (two) times daily with a meal. 09/03/15   Janith Lima, MD   BP 121/82 mmHg  Pulse 122  Temp(Src) 97.6 F (36.4 C) (Oral)  Resp 15  SpO2 94% Physical Exam  Constitutional: She is oriented to person, place, and time. She appears well-developed and well-nourished.  HENT:  Head: Normocephalic and atraumatic.  Right Ear: External ear normal.  Left Ear: External ear normal.  Nose: Nose normal.  Mouth/Throat: Mucous membranes are dry.  Eyes: Right eye exhibits no discharge. Left eye exhibits no discharge.  Cardiovascular: Regular rhythm and normal heart sounds.  Tachycardia present.   Pulmonary/Chest: Effort normal. She has rhonchi.  Abdominal: Soft. She exhibits no distension. There is no tenderness.  Neurological: She is alert and oriented to person, place, and time.  Skin: Skin is warm and dry. She is not diaphoretic.  Nursing note and vitals reviewed.   ED Course  Procedures (including critical care time) Labs Review Labs Reviewed  COMPREHENSIVE METABOLIC PANEL - Abnormal; Notable for the following:    Glucose, Bld 206 (*)    All other components within normal limits  CBC WITH DIFFERENTIAL/PLATELET - Abnormal; Notable for the following:    WBC 11.7 (*)    HCT 46.2 (*)    Lymphs Abs 5.5 (*)    All other components within normal limits  I-STAT CG4 LACTIC ACID, ED - Abnormal; Notable for the following:    Lactic Acid, Venous 3.44 (*)    All other components within normal limits  I-STAT CG4 LACTIC ACID, ED - Abnormal; Notable for the following:    Lactic Acid, Venous  3.26 (*)    All other components within normal limits  URINE CULTURE  CULTURE, BLOOD (ROUTINE X 2)  CULTURE, BLOOD (ROUTINE X 2)  RESPIRATORY VIRUS PANEL  URINALYSIS, ROUTINE W REFLEX MICROSCOPIC (NOT AT [facility])  INFLUENZA PANEL BY PCR (TYPE A & B, H1N1)  I-STAT TROPOININ, ED    Imaging Review Dg Chest 2 View  12/03/2015  CLINICAL DATA:  Worsening cough, congestion, shortness of breath, chest pain for 2 weeks EXAM: CHEST  2 VIEW COMPARISON:  02/13/2014 FINDINGS: Cardiomediastinal  silhouette is unremarkable. No acute infiltrate or pleural effusion. No pulmonary edema. Prior vertebroplasty noted mid lumbar spine. IMPRESSION: No active cardiopulmonary disease. Electronically Signed   By: Lahoma Crocker M.D.   On: 12/03/2015 12:42   Ct Angio Chest Pe W/cm &/or Wo Cm  12/03/2015  CLINICAL DATA:  Hemoptysis. Dyspnea and cough. Mid chest pain for 2 weeks. Low-grade fever. EXAM: CT ANGIOGRAPHY CHEST WITH CONTRAST TECHNIQUE: Multidetector CT imaging of the chest was performed using the standard protocol during bolus administration of intravenous contrast. Multiplanar CT image reconstructions and MIPs were obtained to evaluate the vascular anatomy. CONTRAST:  113m OMNIPAQUE IOHEXOL 350 MG/ML SOLN COMPARISON:  Chest radiographs earlier today FINDINGS: Pulmonary arterial opacification is adequate without filling does defects seen to indicate emboli. LAD coronary artery calcification is noted. The heart is normal in size. No enlarged axillary, mediastinal, or hilar lymph nodes are identified. There is no pleural or pericardial effusion. Major airways are patent. Mild bronchial wall thickening is questioned. Evaluation of the lung parenchyma is mildly limited by respiratory motion artifact. No airspace consolidation or lung nodules are identified. Minimal dependent atelectasis is noted bilaterally. The visualized portion of the upper abdomen is unremarkable. Scattered, small Schmorl's nodes are noted in the thoracic  spine. Review of the MIP images confirms the above findings. IMPRESSION: No evidence of pulmonary emboli. Electronically Signed   By: ALogan BoresM.D.   On: 12/03/2015 14:59   I have personally reviewed and evaluated these images and lab results as part of my medical decision-making.   EKG Interpretation   Date/Time:  Monday December 03 2015 10:35:00 EST Ventricular Rate:  110 PR Interval:  157 QRS Duration: 80 QT Interval:  330 QTC Calculation: 446 R Axis:   -40 Text Interpretation:  Sinus tachycardia Inferior infarct, old Anterior  infarct, old rate is faster compared to 2014 Confirmed by GOLDSTON  MD,  SStanislaus([id number]) on 12/03/2015 1:22:41 PM      MDM   Final diagnoses:  Sepsis, due to unspecified organism ([facility]    Patient with respiratory symptoms and findings concerning for sepsis. No obvious source although this could be flu. Will be given fluids per sepsis protocol (repeat lactate unchanged from 3.5 but no fluids had been given yet) and re-evaluated. Given lactate elevation in setting of infection (though could be viral), will admit to hospitalist for supportive care. CTA negative for acute PE or pneumonia.    SSherwood Gambler MD 12/03/15 [phone]

## 2015-12-03 NOTE — Progress Notes (Signed)
Utilization Review completed.  Trisa Cranor RN CM  

## 2015-12-03 NOTE — ED Notes (Signed)
RN will draw blood work

## 2015-12-04 ENCOUNTER — Inpatient Hospital Stay (HOSPITAL_COMMUNITY): Payer: PPO

## 2015-12-04 DIAGNOSIS — R0600 Dyspnea, unspecified: Secondary | ICD-10-CM | POA: Diagnosis not present

## 2015-12-04 DIAGNOSIS — R05 Cough: Secondary | ICD-10-CM | POA: Diagnosis not present

## 2015-12-04 DIAGNOSIS — E1149 Type 2 diabetes mellitus with other diabetic neurological complication: Secondary | ICD-10-CM | POA: Diagnosis not present

## 2015-12-04 DIAGNOSIS — J219 Acute bronchiolitis, unspecified: Secondary | ICD-10-CM | POA: Diagnosis not present

## 2015-12-04 DIAGNOSIS — E1165 Type 2 diabetes mellitus with hyperglycemia: Secondary | ICD-10-CM | POA: Diagnosis not present

## 2015-12-04 DIAGNOSIS — K219 Gastro-esophageal reflux disease without esophagitis: Secondary | ICD-10-CM | POA: Diagnosis not present

## 2015-12-04 DIAGNOSIS — I10 Essential (primary) hypertension: Secondary | ICD-10-CM | POA: Diagnosis not present

## 2015-12-04 LAB — COMPREHENSIVE METABOLIC PANEL
ALT: 23 U/L (ref 14–54)
AST: 34 U/L (ref 15–41)
Albumin: 3.7 g/dL (ref 3.5–5.0)
Alkaline Phosphatase: 71 U/L (ref 38–126)
Anion gap: 10 (ref 5–15)
BUN: 12 mg/dL (ref 6–20)
CO2: 21 mmol/L — ABNORMAL LOW (ref 22–32)
Calcium: 8.8 mg/dL — ABNORMAL LOW (ref 8.9–10.3)
Chloride: 106 mmol/L (ref 101–111)
Creatinine, Ser: 0.7 mg/dL (ref 0.44–1.00)
GFR calc Af Amer: >60 mL/min (ref >60)
GFR calc non Af Amer: >60 mL/min (ref >60)
Glucose, Bld: 284 mg/dL — ABNORMAL HIGH (ref 65–99)
Potassium: 4.4 mmol/L (ref 3.5–5.1)
Sodium: 137 mmol/L (ref 135–145)
Total Bilirubin: 0.7 mg/dL (ref 0.3–1.2)
Total Protein: 7.1 g/dL (ref 6.5–8.1)

## 2015-12-04 LAB — RAPID STREP SCREEN (MED CTR MEBANE ONLY): Streptococcus, Group A Screen (Direct): NEGATIVE

## 2015-12-04 LAB — CBC
HCT: 40.7 % (ref 36.0–46.0)
Hemoglobin: 12.9 g/dL (ref 12.0–15.0)
MCH: 30.4 pg (ref 26.0–34.0)
MCHC: 31.7 g/dL (ref 30.0–36.0)
MCV: 96 fL (ref 78.0–100.0)
Platelets: 212 K/uL (ref 150–400)
RBC: 4.24 MIL/uL (ref 3.87–5.11)
RDW: 13.5 % (ref 11.5–15.5)
WBC: 10.6 K/uL — ABNORMAL HIGH (ref 4.0–10.5)

## 2015-12-04 LAB — HEMOGLOBIN A1C
Hgb A1c MFr Bld: 8.2 % — ABNORMAL HIGH (ref 4.8–5.6)
Mean Plasma Glucose: 189 mg/dL

## 2015-12-04 LAB — GLUCOSE, CAPILLARY
Glucose-Capillary: 316 mg/dL — ABNORMAL HIGH (ref 65–99)
Glucose-Capillary: 269 mg/dL — ABNORMAL HIGH (ref 65–99)

## 2015-12-04 LAB — CBG MONITORING, ED: Glucose-Capillary: 234 mg/dL — ABNORMAL HIGH (ref 65–99)

## 2015-12-04 LAB — URINE CULTURE

## 2015-12-04 LAB — TROPONIN I: Troponin I: <0.03 ng/mL (ref <0.031)

## 2015-12-04 MED ORDER — GUAIFENESIN-DM 100-10 MG/5ML PO SYRP
15.0000 mL | ORAL_SOLUTION | ORAL | Status: DC | PRN
  Administered 2015-12-04 – 2015-12-05 (×4): 15 mL via ORAL
  Filled 2015-12-04 (×5): qty 20

## 2015-12-04 MED ORDER — ALBUTEROL SULFATE (2.5 MG/3ML) 0.083% IN NEBU: 2.5000 mg | INHALATION_SOLUTION | Freq: Four times a day (QID) | RESPIRATORY_TRACT | Status: DC | PRN

## 2015-12-04 MED ORDER — GLIMEPIRIDE 2 MG PO TABS
2.0000 mg | ORAL_TABLET | Freq: Every day | ORAL | Status: DC
  Administered 2015-12-04 – 2015-12-06 (×3): 2 mg via ORAL
  Filled 2015-12-04 (×4): qty 1

## 2015-12-04 MED ORDER — HYDRALAZINE HCL 20 MG/ML IJ SOLN
5.0000 mg | INTRAMUSCULAR | Status: DC | PRN
  Administered 2015-12-04: 5 mg via INTRAVENOUS
  Filled 2015-12-04: qty 1

## 2015-12-04 MED ORDER — ALBUTEROL SULFATE (2.5 MG/3ML) 0.083% IN NEBU
2.5000 mg | INHALATION_SOLUTION | Freq: Two times a day (BID) | RESPIRATORY_TRACT | Status: DC
Start: 2015-12-05 — End: 2015-12-06
  Administered 2015-12-05 – 2015-12-06 (×2): 2.5 mg via RESPIRATORY_TRACT
  Filled 2015-12-04 (×3): qty 3

## 2015-12-04 NOTE — Progress Notes (Signed)
  Echocardiogram 2D Echocardiogram has been performed.  Jennette Dubin 12/04/2015, 2:39 PM

## 2015-12-04 NOTE — Progress Notes (Addendum)
Patient ID: Abigail Collins, female   DOB: June 20, 1951, 65 y.o.   MRN: [id number]  [facility] PROGRESS NOTE  Abigail Collins [id number] DOB: 1951-02-09 DOA: 12/03/2015 PCP: Scarlette Calico, MD   Brief narrative:    65 year old female with a history of diabetes, insulin-dependent, asthma, thyroid disease, remote history of smoking half a pack a day, quit 10 years ago who presented to the ER with difficulty breathing several days in duration, worse with exertion, better at rest, associated with productive cough of clear and yellow sputum, subjective fevers, chills, poor oral intake.   Assessment/Plan:    Principal Problem:   Acute bronchitis - no clear signs of PNA on CXR or CT chest but pt's symptoms highly suggestive of possible bronchitis - agree with Zithromax and Rocephin for now until we have more data back - follow up on sputum analysis, cultures, strep pneumo - prednisone started and will continue for now with planned tapering  - narrow ABX as clinically indicated   Active Problems:   Diabetes mellitus with neurological manifestations, uncontrolled ([place]) - continue insulin regimen as per home schedule - add SSI     Hypomagnesemia and hypophosphatemia  - supplement and repeat electrolytes in AM    Tachycardia - ECHO pending  - pt with no chest pain  - CE's negative     HTN (hypertension), benign - SBP in 160, suspect relate to acute illness - add Hydralazine as needed for better blood pressure control     GERD (gastroesophageal reflux disease) - stable     Morbid obesity (HCC) - Body mass index is 40.49 kg/(m^2).  DVT prophylaxis - Lovenox Sq  Code Status: Full.  Family Communication:  plan of care discussed with the patient Disposition Plan: Home by 2/23  IV access:  Peripheral IV  Procedures and diagnostic studies:    Dg Chest 2 View 12/03/2015  No active cardiopulmonary disease.  Ct Angio Chest Pe W/cm &/or Wo Cm 12/03/2015 No evidence of  pulmonary emboli.   Dg Chest Port 1 View 12/04/2015 Mild vascular congestion and mild cardiomegaly. Lungs remain grossly clear.   Medical Consultants:  None  Other Consultants:  None   IAnti-Infectives:   Zithromax 2/20 --> Rocephin 2/20 -->  Faye Ramsay, MD  [facility] Pager [phone]  If 7PM-7AM, please contact night-coverage [email or web address] Password [email or web address] 12/04/2015, 5:25 PM   LOS: 1 day   HPI/Subjective: No events overnight.   Objective: Filed Vitals:   12/04/15 0319 12/04/15 0803 12/04/15 1222 12/04/15 1400  BP: 148/85 156/86 147/92 162/94  Pulse: 97 86 89 93  Temp: 98.1 F (36.7 C)   97.5 F (36.4 C)  TempSrc: Oral   Oral  Resp: 15 22 17 20   Height:      Weight:      SpO2: 93% 97% 95% 95%    Intake/Output Summary (Last 24 hours) at 12/04/15 1725 Last data filed at 12/04/15 1700  Gross per 24 hour  Intake    243 ml  Output    500 ml  Net   -257 ml    Exam:   General:  Pt is alert, follows commands appropriately, not in acute distress  Cardiovascular: Regular rate and rhythm, no rubs, no gallops  Respiratory: Clear to auscultation bilaterally, no wheezing, mild rhonchi at bases with mild exp wheezing   Abdomen: Soft, non tender, non distended, bowel sounds present, no guarding  Extremities: No edema, pulses DP and PT palpable bilaterally  Neuro: Grossly nonfocal  Data Reviewed: Basic  Metabolic Panel:  Recent Labs Lab 12/03/15 1156 12/03/15 1629 12/04/15 0604  NA 140  --  137  K 3.6  --  4.4  CL 104  --  106  CO2 24  --  21*  GLUCOSE 206*  --  284*  BUN 14  --  12  CREATININE 0.84  --  0.70  CALCIUM 9.3  --  8.8*  MG  --  1.6*  --   PHOS  --  2.4*  --    Liver Function Tests:  Recent Labs Lab 12/03/15 1156 12/04/15 0604  AST 34 34  ALT 24 23  ALKPHOS 87 71  BILITOT 0.7 0.7  PROT 7.6 7.1  ALBUMIN 4.1 3.7   No results for input(s): LIPASE, AMYLASE in the last 168 hours. No results for input(s): AMMONIA in the last 168  hours. CBC:  Recent Labs Lab 12/03/15 1200 12/04/15 0604  WBC 11.7* 10.6*  NEUTROABS 5.4  --   HGB 14.2 12.9  HCT 46.2* 40.7  MCV 96.9 96.0  PLT 211 212   Cardiac Enzymes:  Recent Labs Lab 12/03/15 1629 12/03/15 2233 12/04/15 0602  TROPONINI <0.03 <0.03 <0.03   BNP: Invalid input(s): POCBNP CBG:  Recent Labs Lab 12/03/15 1643 12/03/15 2205 12/04/15 1152  GLUCAP 136* 339* 234*    Recent Results (from the past 240 hour(s))  Blood Culture (routine x 2)     Status: None (Preliminary result)   Collection Time: 12/03/15  2:10 PM  Result Value Ref Range Status   Specimen Description BLOOD RIGHT ARM  Final   Special Requests BOTTLES DRAWN AEROBIC AND ANAEROBIC 5 CC EA  Final   Culture   Final    NO GROWTH < 24 HOURS Performed at [facility]    Report Status PENDING  Incomplete  Blood Culture (routine x 2)     Status: None (Preliminary result)   Collection Time: 12/03/15  2:20 PM  Result Value Ref Range Status   Specimen Description BLOOD RIGHT FOREARM  Final   Special Requests BOTTLES DRAWN AEROBIC AND ANAEROBIC 5 CC EA  Final   Culture   Final    NO GROWTH < 24 HOURS Performed at [facility]    Report Status PENDING  Incomplete  Urine culture     Status: None   Collection Time: 12/03/15  2:52 PM  Result Value Ref Range Status   Specimen Description URINE, CLEAN CATCH  Final   Special Requests NONE  Final   Culture   Final    MULTIPLE SPECIES PRESENT, SUGGEST RECOLLECTION Performed at [facility]    Report Status 12/04/2015 FINAL  Final  Rapid strep screen (not at [facility])     Status: None   Collection Time: 12/04/15 12:00 AM  Result Value Ref Range Status   Streptococcus, Group A Screen (Direct) NEGATIVE NEGATIVE Final    Comment: (NOTE) A Rapid Antigen test may result negative if the antigen level in the sample is below the detection level of this test. The FDA has not cleared this test as a stand-alone test therefore the rapid  antigen negative result has reflexed to a Group A Strep culture.      Scheduled Meds: . azithromycin  500 mg Intravenous Q24H  . cefTRIAXone (ROCEPHIN)  IV  1 g Intravenous Q24H  . docusate sodium  100 mg Oral BID  . enoxaparin (LOVENOX) injection  50 mg Subcutaneous Q24H  . glimepiride  2 mg Oral QAC breakfast  .  insulin aspart  0-9 Units Subcutaneous TID WC  . insulin glargine  50 Units Subcutaneous QHS  . insulin NPH Human  20 Units Subcutaneous BID AC  . pantoprazole  40 mg Oral Daily  . predniSONE  40 mg Oral Q breakfast  . sodium chloride flush  3 mL Intravenous Q12H   Continuous Infusions: . sodium chloride Stopped (12/03/15 1653)  . dextrose 5 % and 0.45% NaCl Stopped (12/03/15 2211)

## 2015-12-05 ENCOUNTER — Inpatient Hospital Stay (HOSPITAL_COMMUNITY): Payer: PPO

## 2015-12-05 ENCOUNTER — Encounter (HOSPITAL_COMMUNITY): Payer: Self-pay | Admitting: Radiology

## 2015-12-05 DIAGNOSIS — R51 Headache: Secondary | ICD-10-CM | POA: Diagnosis not present

## 2015-12-05 DIAGNOSIS — J219 Acute bronchiolitis, unspecified: Secondary | ICD-10-CM | POA: Diagnosis not present

## 2015-12-05 DIAGNOSIS — E1149 Type 2 diabetes mellitus with other diabetic neurological complication: Secondary | ICD-10-CM | POA: Diagnosis not present

## 2015-12-05 DIAGNOSIS — I10 Essential (primary) hypertension: Secondary | ICD-10-CM | POA: Diagnosis not present

## 2015-12-05 DIAGNOSIS — H5710 Ocular pain, unspecified eye: Secondary | ICD-10-CM | POA: Diagnosis not present

## 2015-12-05 DIAGNOSIS — K219 Gastro-esophageal reflux disease without esophagitis: Secondary | ICD-10-CM | POA: Diagnosis not present

## 2015-12-05 DIAGNOSIS — E1165 Type 2 diabetes mellitus with hyperglycemia: Secondary | ICD-10-CM | POA: Diagnosis not present

## 2015-12-05 LAB — CBC
HCT: 38.5 % (ref 36.0–46.0)
Hemoglobin: 11.8 g/dL — ABNORMAL LOW (ref 12.0–15.0)
MCH: 29.8 pg (ref 26.0–34.0)
MCHC: 30.6 g/dL (ref 30.0–36.0)
MCV: 97.2 fL (ref 78.0–100.0)
Platelets: 208 10*3/uL (ref 150–400)
RBC: 3.96 MIL/uL (ref 3.87–5.11)
RDW: 13.7 % (ref 11.5–15.5)
WBC: 10.2 10*3/uL (ref 4.0–10.5)

## 2015-12-05 LAB — GLUCOSE, CAPILLARY
Glucose-Capillary: 120 mg/dL — ABNORMAL HIGH (ref 65–99)
Glucose-Capillary: 280 mg/dL — ABNORMAL HIGH (ref 65–99)
Glucose-Capillary: 418 mg/dL — ABNORMAL HIGH (ref 65–99)
Glucose-Capillary: 329 mg/dL — ABNORMAL HIGH (ref 65–99)

## 2015-12-05 LAB — BASIC METABOLIC PANEL
Anion gap: 10 (ref 5–15)
BUN: 21 mg/dL — ABNORMAL HIGH (ref 6–20)
CO2: 24 mmol/L (ref 22–32)
Calcium: 9.1 mg/dL (ref 8.9–10.3)
Chloride: 110 mmol/L (ref 101–111)
Creatinine, Ser: 1.19 mg/dL — ABNORMAL HIGH (ref 0.44–1.00)
GFR calc Af Amer: 55 mL/min — ABNORMAL LOW (ref >60)
GFR calc non Af Amer: 48 mL/min — ABNORMAL LOW (ref >60)
Glucose, Bld: 129 mg/dL — ABNORMAL HIGH (ref 65–99)
Potassium: 3.8 mmol/L (ref 3.5–5.1)
Sodium: 144 mmol/L (ref 135–145)

## 2015-12-05 LAB — PHOSPHORUS: Phosphorus: 4 mg/dL (ref 2.5–4.6)

## 2015-12-05 LAB — MAGNESIUM: Magnesium: 2.1 mg/dL (ref 1.7–2.4)

## 2015-12-05 MED ORDER — KETOROLAC TROMETHAMINE 15 MG/ML IJ SOLN: 15.0000 mg | Freq: Four times a day (QID) | INTRAMUSCULAR | Status: DC | PRN

## 2015-12-05 MED ORDER — ACETAMINOPHEN 325 MG PO TABS
650.0000 mg | ORAL_TABLET | Freq: Four times a day (QID) | ORAL | Status: DC | PRN
  Administered 2015-12-05 (×2): 650 mg via ORAL
  Filled 2015-12-05 (×2): qty 2

## 2015-12-05 MED ORDER — PREDNISONE 20 MG PO TABS
30.0000 mg | ORAL_TABLET | Freq: Every day | ORAL | Status: DC
  Administered 2015-12-06: 30 mg via ORAL
  Filled 2015-12-05: qty 1

## 2015-12-05 MED ORDER — INSULIN ASPART 100 UNIT/ML ~~LOC~~ SOLN
7.0000 [IU] | Freq: Once | SUBCUTANEOUS | Status: AC
  Administered 2015-12-05: 7 [IU] via SUBCUTANEOUS

## 2015-12-05 NOTE — Progress Notes (Signed)
Patient ID: Abigail Collins, female   DOB: 1951/05/28, 65 y.o.   MRN: [id number]  [facility] PROGRESS NOTE  Abigail Collins [id number] DOB: 1951-02-07 DOA: 12/03/2015 PCP: Scarlette Calico, MD   Brief narrative:    65 year old female with a history of diabetes, insulin-dependent, asthma, thyroid disease, remote history of smoking half a pack a day, quit 10 years ago who presented to the ER with difficulty breathing several days in duration, worse with exertion, better at rest, associated with productive cough of clear and yellow sputum, subjective fevers, chills, poor oral intake.   Assessment/Plan:    Principal Problem:   Acute bronchitis - no clear signs of PNA on CXR or CT chest but pt's symptoms highly suggestive of possible bronchitis - continue Zithromax and Rocephin day #2 - follow up on sputum analysis, cultures, strep pneumo - prednisone started and will continue tapering  - narrow ABX as clinically indicated   Active Problems:   Diabetes mellitus with neurological manifestations, uncontrolled (HCC) - continue insulin regimen as per home schedule - add SSI     Hypomagnesemia and hypophosphatemia  - supplemented and WNL    Acute kidney injury - encouraged PO intake - BMP in AM    Tachycardia - ECHO pending  - resolved     HTN (hypertension), benign - reasonable inpatient control     GERD (gastroesophageal reflux disease) - stable     Morbid obesity (HCC) - Body mass index is 40.49 kg/(m^2).  DVT prophylaxis - Lovenox Sq  Code Status: Full.  Family Communication:  plan of care discussed with the patient Disposition Plan: Home by 2/23  IV access:  Peripheral IV  Procedures and diagnostic studies:    Dg Chest 2 View 12/03/2015  No active cardiopulmonary disease.  Ct Angio Chest Pe W/cm &/or Wo Cm 12/03/2015 No evidence of pulmonary emboli.   Dg Chest Port 1 View 12/04/2015 Mild vascular congestion and mild cardiomegaly. Lungs remain grossly  clear.   Medical Consultants:  None  Other Consultants:  None   IAnti-Infectives:   Zithromax 2/20 --> Rocephin 2/20 -->  Faye Ramsay, MD  [facility] Pager [phone]  If 7PM-7AM, please contact night-coverage [email or web address] Password [facility] 12/05/2015, 11:23 AM   LOS: 2 days   HPI/Subjective: No events overnight. Feels pressure in maxillary sinuses.   Objective: Filed Vitals:   12/04/15 1222 12/04/15 1400 12/04/15 2133 12/05/15 0446  BP: 147/92 162/94 127/69 134/78  Pulse: 89 93 94 88  Temp:  97.5 F (36.4 C) 98 F (36.7 C) 98.2 F (36.8 C)  TempSrc:  Oral Oral Oral  Resp: 17 20 18 16   Height:      Weight:      SpO2: 95% 95% 97% 96%    Intake/Output Summary (Last 24 hours) at 12/05/15 1123 Last data filed at 12/05/15 0945  Gross per 24 hour  Intake   1380 ml  Output   1451 ml  Net    -71 ml    Exam:   General:  Pt is alert, follows commands appropriately, TTP in bilateral maxillary sinuses   Cardiovascular: Regular rate and rhythm, no rubs, no gallops  Respiratory: Clear to auscultation bilaterally, no wheezing, mild rhonchi at bases with mild exp wheezing   Abdomen: Soft, non tender, non distended, bowel sounds present, no guarding  Extremities: No edema, pulses DP and PT palpable bilaterally  Neuro: Grossly nonfocal  Data Reviewed: Basic Metabolic Panel:  Recent Labs Lab 12/03/15 1156 12/03/15 1629 12/04/15 0604 12/05/15 0531  NA 140  --  137 144  K 3.6  --  4.4 3.8  CL 104  --  106 110  CO2 24  --  21* 24  GLUCOSE 206*  --  284* 129*  BUN 14  --  12 21*  CREATININE 0.84  --  0.70 1.19*  CALCIUM 9.3  --  8.8* 9.1  MG  --  1.6*  --  2.1  PHOS  --  2.4*  --  4.0   Liver Function Tests:  Recent Labs Lab 12/03/15 1156 12/04/15 0604  AST 34 34  ALT 24 23  ALKPHOS 87 71  BILITOT 0.7 0.7  PROT 7.6 7.1  ALBUMIN 4.1 3.7   CBC:  Recent Labs Lab 12/03/15 1200 12/04/15 0604 12/05/15 0531  WBC 11.7* 10.6* 10.2  NEUTROABS 5.4   --   --   HGB 14.2 12.9 11.8*  HCT 46.2* 40.7 38.5  MCV 96.9 96.0 97.2  PLT 211 212 208   Cardiac Enzymes:  Recent Labs Lab 12/03/15 1629 12/03/15 2233 12/04/15 0602  TROPONINI <0.03 <0.03 <0.03   CBG:  Recent Labs Lab 12/03/15 2205 12/04/15 1152 12/04/15 1731 12/04/15 2137 12/05/15 0801  GLUCAP 339* 234* 316* 269* 120*   Recent Results (from the past 240 hour(s))  Blood Culture (routine x 2)     Status: None (Preliminary result)   Collection Time: 12/03/15  2:10 PM  Result Value Ref Range Status   Specimen Description BLOOD RIGHT ARM  Final   Culture   Final    NO GROWTH < 24 HOURS Performed at [facility]    Report Status PENDING  Incomplete  Blood Culture (routine x 2)     Status: None (Preliminary result)   Collection Time: 12/03/15  2:20 PM  Result Value Ref Range Status   Specimen Description BLOOD RIGHT FOREARM  Final   Culture   Final    NO GROWTH < 24 HOURS Performed at [facility]    Report Status PENDING  Incomplete  Urine culture     Status: None   Collection Time: 12/03/15  2:52 PM  Result Value Ref Range Status   Specimen Description URINE, CLEAN CATCH  Final   Special Requests NONE  Final   Culture   Final    MULTIPLE SPECIES PRESENT, SUGGEST RECOLLECTION Performed at [facility]    Report Status 12/04/2015 FINAL  Final  Rapid strep screen (not at [facility])     Status: None   Collection Time: 12/04/15 12:00 AM  Result Value Ref Range Status   Streptococcus, Group A Screen (Direct) NEGATIVE NEGATIVE Final     Scheduled Meds: . albuterol  2.5 mg Nebulization BID  . azithromycin  500 mg Intravenous Q24H  . cefTRIAXone (ROCEPHIN)  IV  1 g Intravenous Q24H  . docusate sodium  100 mg Oral BID  . enoxaparin (LOVENOX) injection  50 mg Subcutaneous Q24H  . glimepiride  2 mg Oral QAC breakfast  . insulin aspart  0-9 Units Subcutaneous TID WC  . insulin glargine  50 Units Subcutaneous QHS  . insulin NPH Human  20 Units  Subcutaneous BID AC  . pantoprazole  40 mg Oral Daily  . predniSONE  40 mg Oral Q breakfast  . sodium chloride flush  3 mL Intravenous Q12H   Continuous Infusions:

## 2015-12-05 NOTE — Progress Notes (Addendum)
Blood sugar 418. Notified Dr. Olen Pel.   7 additional units of Novolog added to sliding scale maximum. Total of 16 units Novolog given along with scheduled 20 units of NPH. Pt educated on signs of hypoglycemia and told her to call if she ever feels symptomatic.   She is currently eating dinner.

## 2015-12-06 DIAGNOSIS — J219 Acute bronchiolitis, unspecified: Secondary | ICD-10-CM | POA: Diagnosis not present

## 2015-12-06 DIAGNOSIS — I10 Essential (primary) hypertension: Secondary | ICD-10-CM | POA: Diagnosis not present

## 2015-12-06 DIAGNOSIS — E1149 Type 2 diabetes mellitus with other diabetic neurological complication: Secondary | ICD-10-CM | POA: Diagnosis not present

## 2015-12-06 DIAGNOSIS — K219 Gastro-esophageal reflux disease without esophagitis: Secondary | ICD-10-CM | POA: Diagnosis not present

## 2015-12-06 DIAGNOSIS — E1165 Type 2 diabetes mellitus with hyperglycemia: Secondary | ICD-10-CM | POA: Diagnosis not present

## 2015-12-06 LAB — CULTURE, GROUP A STREP (THRC)

## 2015-12-06 LAB — BASIC METABOLIC PANEL
Anion gap: 9 (ref 5–15)
BUN: 20 mg/dL (ref 6–20)
CO2: 28 mmol/L (ref 22–32)
Calcium: 9.6 mg/dL (ref 8.9–10.3)
Chloride: 107 mmol/L (ref 101–111)
Creatinine, Ser: 0.87 mg/dL (ref 0.44–1.00)
GFR calc Af Amer: >60 mL/min (ref >60)
GFR calc non Af Amer: >60 mL/min (ref >60)
Glucose, Bld: 123 mg/dL — ABNORMAL HIGH (ref 65–99)
Potassium: 4.1 mmol/L (ref 3.5–5.1)
Sodium: 144 mmol/L (ref 135–145)

## 2015-12-06 LAB — RESPIRATORY VIRUS PANEL
Adenovirus: NEGATIVE
Influenza A: NEGATIVE
Influenza B: NEGATIVE
Metapneumovirus: NEGATIVE
Parainfluenza 1: NEGATIVE
Parainfluenza 2: NEGATIVE
Parainfluenza 3: NEGATIVE
Respiratory Syncytial Virus A: NEGATIVE
Respiratory Syncytial Virus B: NEGATIVE
Rhinovirus: NEGATIVE

## 2015-12-06 LAB — CBC
HCT: 40.9 % (ref 36.0–46.0)
Hemoglobin: 13.3 g/dL (ref 12.0–15.0)
MCH: 31.1 pg (ref 26.0–34.0)
MCHC: 32.5 g/dL (ref 30.0–36.0)
MCV: 95.8 fL (ref 78.0–100.0)
Platelets: 218 10*3/uL (ref 150–400)
RBC: 4.27 MIL/uL (ref 3.87–5.11)
RDW: 13.7 % (ref 11.5–15.5)
WBC: 9.7 10*3/uL (ref 4.0–10.5)

## 2015-12-06 LAB — GLUCOSE, CAPILLARY
Glucose-Capillary: 88 mg/dL (ref 65–99)
Glucose-Capillary: 272 mg/dL — ABNORMAL HIGH (ref 65–99)

## 2015-12-06 MED ORDER — AMOXICILLIN-POT CLAVULANATE 875-125 MG PO TABS: 1.0000 | ORAL_TABLET | Freq: Two times a day (BID) | ORAL | Status: DC

## 2015-12-06 MED ORDER — BENZONATATE 100 MG PO CAPS
200.0000 mg | ORAL_CAPSULE | Freq: Three times a day (TID) | ORAL | Status: DC | PRN
  Administered 2015-12-06: 200 mg via ORAL
  Filled 2015-12-06: qty 2

## 2015-12-06 MED ORDER — AZITHROMYCIN 500 MG PO TABS
500.0000 mg | ORAL_TABLET | Freq: Every day | ORAL | Status: DC
  Filled 2015-12-06: qty 1

## 2015-12-06 MED ORDER — PREDNISONE 10 MG PO TABS: ORAL_TABLET | ORAL | Status: DC

## 2015-12-06 MED ORDER — BENZONATATE 200 MG PO CAPS: 200.0000 mg | ORAL_CAPSULE | Freq: Three times a day (TID) | ORAL | Status: DC | PRN

## 2015-12-06 NOTE — Clinical Documentation Improvement (Signed)
Internal Medicine  Per H + P patient met criteria for "sepsis" with "tachycardia and elevated lactic acid". Was this diagnosis ruled in or out? Thank you    Sepsis ruled in /POA  Sepsis rule out   Other  Clinically Undetermined  Supporting Information: Elev Lactic Acid, treated Zithromycin, Rocephin   Please exercise your independent, professional judgment when responding. A specific answer is not anticipated or expected.   Thank You, [place] [phone]

## 2015-12-06 NOTE — Progress Notes (Signed)
Albuterol nebulizer treatments ordered as D/C medication. Per MD, nebulizer treatments do not have to be continued at home. Patient aware and stated nebulizer treatments, "don't really do anything for me." Will continue with D/C.

## 2015-12-06 NOTE — Discharge Summary (Signed)
Physician Discharge Summary  Abigail Collins OLI:[id number] DOB: 07-31-1951 DOA: 12/03/2015  PCP: Scarlette Calico, MD  Admit date: 12/03/2015 Discharge date: 12/06/2015  Recommendations for Outpatient Follow-up:  1. Pt will need to follow up with PCP in 2-3 weeks post discharge 2. Please obtain BMP to evaluate electrolytes and kidney function 3. Please also check CBC to evaluate Hg and Hct levels 4. Complete therapy with Augmentin for sinusitis  5. Referral to ENT provided   Discharge Diagnoses:  Principal Problem:   Acute bronchiolitis with bronchospasm Active Problems:   Diabetes mellitus with neurological manifestations, uncontrolled ([place])   HTN (hypertension), benign   GERD (gastroesophageal reflux disease)   Morbid obesity ([place])  Discharge Condition: Stable  Diet recommendation: Heart healthy diet discussed in details    Brief narrative:    65 year old female with a history of diabetes, insulin-dependent, asthma, thyroid disease, remote history of smoking half a pack a day, quit 10 years ago who presented to the ER with difficulty breathing several days in duration, worse with exertion, better at rest, associated with productive cough of clear and yellow sputum, subjective fevers, chills, poor oral intake.   Assessment/Plan:    Principal Problem:  Acute bronchitis - no clear signs of PNA on CXR or CT chest but pt's symptoms highly suggestive of possible bronchitis - also CT scan notable for bilateral maxillary sinus mucosal thickening L>R with significant mucosal thickening with almost complete opacification left sphenoid sinus - prednisone started and will continue tapering  - narrow ABX to Augmentin to complete  - please note that sepsis was ruled out   Active Problems:  Diabetes mellitus with neurological manifestations, uncontrolled (HCC) - continue insulin regimen as per home schedule - also ok to continue oral antihyperglycemic regimen     Hypomagnesemia and hypophosphatemia  - supplemented and WNL   Acute kidney injury - encouraged PO intake - resolved   Tachycardia - resolved, ECHO with normal EF 60%   HTN (hypertension), benign - reasonable inpatient control    GERD (gastroesophageal reflux disease) - stable    Morbid obesity (HCC) - Body mass index is 40.49 kg/(m^2).  Code Status: Full.  Family Communication: plan of care discussed with the patient Disposition Plan: Home   IV access:  Peripheral IV  Procedures and diagnostic studies:   Dg Chest 2 View 12/03/2015 No active cardiopulmonary disease.  Ct Angio Chest Pe W/cm &/or Wo Cm 12/03/2015 No evidence of pulmonary emboli.   Dg Chest Port 1 View 12/04/2015 Mild vascular congestion and mild cardiomegaly. Lungs remain grossly clear.   Medical Consultants:  None  Other Consultants:  None      Discharge Exam: Filed Vitals:   12/06/15 0500 12/06/15 0929  BP: 148/74   Pulse: 86 90  Temp: 97.9 F (36.6 C)   Resp: 16 18   Filed Vitals:   12/05/15 2011 12/05/15 2111 12/06/15 0500 12/06/15 0929  BP: 149/93  148/74   Pulse: 94  86 90  Temp: 97.7 F (36.5 C)  97.9 F (36.6 C)   TempSrc: Oral  Oral   Resp: _0 Height:      Weight:      SpO2: 97% 98% 95% 95%    General: Pt is alert, follows commands appropriately, not in acute distress Cardiovascular: Regular rate and rhythm, no rubs, no gallops Respiratory: Clear to auscultation bilaterally, no wheezing, diminished breath sounds at bases  Abdominal: Soft, non tender, non distended, bowel sounds +, no guarding Extremities:  no cyanosis, pulses palpable bilaterally DP and PT Neuro: Grossly nonfocal  Discharge Instructions  Discharge Instructions    Diet - low sodium heart healthy    Complete by:  As directed      Increase activity slowly    Complete by:  As directed             Medication List    TAKE these medications        ADVIL COLD & SINUS  LIQUI-GELS 30-200 MG Caps  Generic drug:  Pseudoephedrine-Ibuprofen  Take 1 capsule by mouth 2 (two) times daily as needed (cold symptoms).     albuterol (2.5 MG/3ML) 0.083% nebulizer solution  Commonly known as:  PROVENTIL  Inhale 2.5 mg into the lungs every 6 (six) hours as needed for wheezing or shortness of breath.     amoxicillin-clavulanate 875-125 MG tablet  Commonly known as:  AUGMENTIN  Take 1 tablet by mouth 2 (two) times daily.     Azilsartan Medoxomil 80 MG Tabs  Commonly known as:  EDARBI  Take 1 tablet (80 mg total) by mouth daily.     benzonatate 200 MG capsule  Commonly known as:  TESSALON  Take 1 capsule (200 mg total) by mouth 3 (three) times daily as needed for cough (unrelieved by Robitussin DM).     FREESTYLE FREEDOM LITE w/Device Kit  1 Act by Does not apply route 3 (three) times daily.     ONETOUCH VERIO IQ SYSTEM w/Device Kit  1 Act by Does not apply route 3 (three) times daily.     glimepiride 2 MG tablet  Commonly known as:  AMARYL  Take 1 tablet (2 mg total) by mouth daily before breakfast.     glucose blood test strip  Commonly known as:  FREESTYLE LITE  Test up to TID dx  260.62     glucose blood test strip  Commonly known as:  ONETOUCH VERIO  Use TID     guaiFENesin-dextromethorphan 100-10 MG/5ML syrup  Commonly known as:  ROBITUSSIN DM  Take 15 mLs by mouth every 4 (four) hours as needed for cough. Sugar free     ibuprofen 200 MG tablet  Commonly known as:  ADVIL,MOTRIN  Take 400 mg by mouth every 6 (six) hours as needed for headache or moderate pain.     insulin NPH Human 100 UNIT/ML injection  Commonly known as:  HUMULIN N  Inject 0.2 mLs (20 Units total) into the skin 2 (two) times daily before a meal.     metFORMIN 1000 MG tablet  Commonly known as:  GLUCOPHAGE  Take 1 tablet (1,000 mg total) by mouth 2 (two) times daily with a meal.     predniSONE 10 MG tablet  Commonly known as:  DELTASONE  Take 30 mg tablet 2/24 and taper  down by 10 mg daily until completed     TOUJEO SOLOSTAR 300 UNIT/ML Sopn  Generic drug:  Insulin Glargine  Inject 50 Units into the skin at bedtime.             Follow-up Information    Follow up with Scarlette Calico, MD.   Specialty:  Internal Medicine   Contact information:   [address] [address] [phone]       Call Faye Ramsay, MD.   Specialty:  Internal Medicine   Why:  As needed call my cell phone [phone]   Contact information:   [place]  [address] [phone]        The results of significant diagnostics from this hospitalization (including imaging, microbiology, ancillary and laboratory) are listed below for reference.     Microbiology: Recent Results (from the past 240 hour(s))  Blood Culture (routine x 2)     Status: None (Preliminary result)   Collection Time: 12/03/15  2:10 PM  Result Value Ref Range Status   Specimen Description BLOOD RIGHT ARM  Final   Culture   Final    NO GROWTH 2 DAYS Performed at [facility]    Report Status PENDING  Incomplete  Blood Culture (routine x 2)     Status: None (Preliminary result)   Collection Time: 12/03/15  2:20 PM  Result Value Ref Range Status   Specimen Description BLOOD RIGHT FOREARM  Final   Culture   Final    NO GROWTH 2 DAYS Performed at [facility]    Report Status PENDING  Incomplete  Urine culture     Status: None   Collection Time: 12/03/15  2:52 PM  Result Value Ref Range Status   Specimen Description URINE, CLEAN CATCH  Final   Special Requests NONE  Final   Culture   Final    MULTIPLE SPECIES PRESENT, SUGGEST RECOLLECTION Performed at [facility]    Report Status 12/04/2015 FINAL  Final  Respiratory virus panel     Status: None   Collection Time: 12/03/15  5:26 PM  Result Value Ref Range Status   Respiratory Syncytial Virus A Negative Negative Final   Respiratory Syncytial Virus B  Negative Negative Final   Influenza A Negative Negative Final   Influenza B Negative Negative Final   Parainfluenza 1 Negative Negative Final   Parainfluenza 2 Negative Negative Final   Parainfluenza 3 Negative Negative Final   Metapneumovirus Negative Negative Final   Rhinovirus Negative Negative Final   Adenovirus Negative Negative Final  Rapid strep screen (not at [facility])     Status: None   Collection Time: 12/04/15 12:00 AM  Result Value Ref Range Status   Streptococcus, Group A Screen (Direct) NEGATIVE NEGATIVE Final  Culture, group A strep     Status: None (Preliminary result)   Collection Time: 12/04/15 12:05 AM  Result Value Ref Range Status   Specimen Description THROAT  Final   Special Requests NONE  Final   Culture   Final    CULTURE REINCUBATED FOR BETTER GROWTH Performed at [facility]    Report Status PENDING  Incomplete     Labs: Basic Metabolic Panel:  Recent Labs Lab 12/03/15 1156 12/03/15 1629 12/04/15 0604 12/05/15 0531 12/06/15 0506  NA 140  --  137 144 144  K 3.6  --  4.4 3.8 4.1  CL 104  --  106 110 107  CO2 24  --  21* 24 28  GLUCOSE 206*  --  284* 129* 123*  BUN 14  --  12 21* 20  CREATININE 0.84  --  0.70 1.19* 0.87  CALCIUM 9.3  --  8.8* 9.1 9.6  MG  --  1.6*  --  2.1  --   PHOS  --  2.4*  --  4.0  --    Liver Function Tests:  Recent Labs Lab 12/03/15 1156 12/04/15 0604  AST 34 34  ALT 24 23  ALKPHOS 87 71  BILITOT 0.7 0.7  PROT 7.6 7.1  ALBUMIN 4.1 3.7   CBC:  Recent Labs Lab 12/03/15 1200 12/04/15 0604  12/05/15 0531 12/06/15 0506  WBC 11.7* 10.6* 10.2 9.7  NEUTROABS 5.4  --   --   --   HGB 14.2 12.9 11.8* 13.3  HCT 46.2* 40.7 38.5 40.9  MCV 96.9 96.0 97.2 95.8  PLT 211 212 208 218   Cardiac Enzymes:  Recent Labs Lab 12/03/15 1629 12/03/15 2233 12/04/15 0602  TROPONINI <0.03 <0.03 <0.03   CBG:  Recent Labs Lab 12/05/15 0801 12/05/15 1159 12/05/15 1702 12/05/15 2111 12/06/15 0745  GLUCAP 120*  280* 418* 329* 88   SIGNED: Time coordinating discharge: 30 minutes  MAGICK-Liboria Putnam, MD  [facility] 12/06/2015, 10:36 AM Pager [phone]  If 7PM-7AM, please contact night-coverage [email or web address] Password [email or web address]

## 2015-12-06 NOTE — Care Management Note (Signed)
Case Management Note  Patient Details  Name: TAJUANA REVEAL MRN: [id number] Date of Birth: [birth year]  Subjective/Objective:  Order for home neb machine. [facility] dme rep Pura Spice aware of order  & d/c to bring to rm.Nsg aware.                  Action/Plan:d/c home.   Expected Discharge Date:   (unnknown)               Expected Discharge Plan:  Home/Self Care  In-House Referral:     Discharge planning Services  CM Consult  Post Acute Care Choice:    Choice offered to:     DME Arranged:  Nebulizer machine DME Agency:  [place]:    [facility] Agency:     Status of Service:  Completed, signed off  Medicare Important Message Given:    Date Medicare IM Given:    Medicare IM give by:    Date Additional Medicare IM Given:    Additional Medicare Important Message give by:     If discussed at [place] of Stay Meetings, dates discussed:    Additional Comments:  Dessa Phi, RN 12/06/2015, 11:46 AM

## 2015-12-06 NOTE — Discharge Instructions (Signed)

## 2015-12-06 NOTE — Care Management Note (Signed)
Case Management Note  Patient Details  Name: Abigail Collins MRN: [id number] Date of Birth: 04-01-1951  Subjective/Objective:  Now MD decided to cancel dme order for home neb machine.[facility] DME rep Pura Spice.                  Action/Plan:d/c home.   Expected Discharge Date:   (unnknown)               Expected Discharge Plan:  Home/Self Care  In-House Referral:     Discharge planning Services  CM Consult  Post Acute Care Choice:    Choice offered to:     DME Arranged:  Nebulizer machine DME Agency:  [place]:    [facility] Agency:     Status of Service:  Completed, signed off  Medicare Important Message Given:    Date Medicare IM Given:    Medicare IM give by:    Date Additional Medicare IM Given:    Additional Medicare Important Message give by:     If discussed at [place] of Stay Meetings, dates discussed:    Additional Comments:  Dessa Phi, RN 12/06/2015, 12:20 PM

## 2015-12-06 NOTE — Progress Notes (Signed)
PHARMACIST - PHYSICIAN COMMUNICATION CONCERNING: Antibiotic IV to Oral Route Change Policy  RECOMMENDATION: This patient is receiving Azithromycin by the intravenous route.  Based on criteria approved by the Pharmacy and Therapeutics Committee, the antibiotic(s) is/are being converted to the equivalent oral dose form(s).   DESCRIPTION: These criteria include:  Patient being treated for a respiratory tract infection, urinary tract infection, cellulitis or clostridium difficile associated diarrhea if on metronidazole  The patient is not neutropenic and does not exhibit a GI malabsorption state  The patient is eating (either orally or via tube) and/or has been taking other orally administered medications for a least 24 hours  The patient is improving clinically and has a Tmax < 100.5  If you have questions about this conversion, please contact the Pharmacy Department  []   [phone] )  [facility] []   [phone] )  [facility] []   [phone] )  [facility] []   [phone] )  [facility] [x]   [phone] )  [place], PharmD, BCPS 12/06/2015, 9:00 AM  Pager: [phone]

## 2015-12-08 LAB — CULTURE, BLOOD (ROUTINE X 2)
Culture: NO GROWTH
Culture: NO GROWTH

## 2015-12-26 ENCOUNTER — Other Ambulatory Visit: Payer: Self-pay | Admitting: Internal Medicine

## 2015-12-26 ENCOUNTER — Telehealth: Payer: Self-pay | Admitting: Internal Medicine

## 2015-12-26 DIAGNOSIS — J219 Acute bronchiolitis, unspecified: Secondary | ICD-10-CM

## 2015-12-26 DIAGNOSIS — J323 Chronic sphenoidal sinusitis: Secondary | ICD-10-CM | POA: Diagnosis not present

## 2015-12-26 DIAGNOSIS — J040 Acute laryngitis: Secondary | ICD-10-CM | POA: Diagnosis not present

## 2015-12-26 DIAGNOSIS — J320 Chronic maxillary sinusitis: Secondary | ICD-10-CM | POA: Diagnosis not present

## 2015-12-26 DIAGNOSIS — J322 Chronic ethmoidal sinusitis: Secondary | ICD-10-CM | POA: Diagnosis not present

## 2015-12-26 DIAGNOSIS — J410 Simple chronic bronchitis: Secondary | ICD-10-CM | POA: Diagnosis not present

## 2015-12-26 DIAGNOSIS — R05 Cough: Secondary | ICD-10-CM | POA: Diagnosis not present

## 2015-12-26 NOTE — Telephone Encounter (Signed)
Pt's ENT called and talked with Dr. Ronnald Ramp stating she needs to come in as soon as we can get her in. I called and left a msg for to call back to reschedule.

## 2015-12-27 ENCOUNTER — Other Ambulatory Visit (INDEPENDENT_AMBULATORY_CARE_PROVIDER_SITE_OTHER): Payer: PPO

## 2015-12-27 ENCOUNTER — Ambulatory Visit (INDEPENDENT_AMBULATORY_CARE_PROVIDER_SITE_OTHER)
Admission: RE | Admit: 2015-12-27 | Discharge: 2015-12-27 | Disposition: A | Discharge status: Home / Self Care | Payer: PPO | Source: Ambulatory Visit | Attending: Internal Medicine | Admitting: Internal Medicine

## 2015-12-27 ENCOUNTER — Ambulatory Visit (INDEPENDENT_AMBULATORY_CARE_PROVIDER_SITE_OTHER): Payer: PPO | Admitting: Internal Medicine

## 2015-12-27 ENCOUNTER — Encounter: Payer: Self-pay | Admitting: Internal Medicine

## 2015-12-27 VITALS — BP 130/88 | HR 97 | Temp 97.5°F | Wt 242.0 lb

## 2015-12-27 DIAGNOSIS — R0600 Dyspnea, unspecified: Secondary | ICD-10-CM

## 2015-12-27 DIAGNOSIS — R195 Other fecal abnormalities: Secondary | ICD-10-CM

## 2015-12-27 DIAGNOSIS — J0130 Acute sphenoidal sinusitis, unspecified: Secondary | ICD-10-CM

## 2015-12-27 DIAGNOSIS — R635 Abnormal weight gain: Secondary | ICD-10-CM

## 2015-12-27 DIAGNOSIS — R05 Cough: Secondary | ICD-10-CM | POA: Diagnosis not present

## 2015-12-27 LAB — CBC WITH DIFFERENTIAL/PLATELET
Basophils Absolute: 0 10*3/uL (ref 0.0–0.1)
Basophils Relative: 0.5 % (ref 0.0–3.0)
Eosinophils Absolute: 0.5 10*3/uL (ref 0.0–0.7)
Eosinophils Relative: 5.2 % — ABNORMAL HIGH (ref 0.0–5.0)
HCT: 42.4 % (ref 36.0–46.0)
Hemoglobin: 14.1 g/dL (ref 12.0–15.0)
Lymphocytes Relative: 38.9 % (ref 12.0–46.0)
Lymphs Abs: 3.6 10*3/uL (ref 0.7–4.0)
MCHC: 33.3 g/dL (ref 30.0–36.0)
MCV: 90.3 fl (ref 78.0–100.0)
Monocytes Absolute: 0.5 10*3/uL (ref 0.1–1.0)
Monocytes Relative: 5.3 % (ref 3.0–12.0)
Neutro Abs: 4.6 10*3/uL (ref 1.4–7.7)
Neutrophils Relative %: 50.1 % (ref 43.0–77.0)
Platelets: 170 10*3/uL (ref 150.0–400.0)
RBC: 4.7 Mil/uL (ref 3.87–5.11)
RDW: 13.8 % (ref 11.5–15.5)
WBC: 9.2 10*3/uL (ref 4.0–10.5)

## 2015-12-27 NOTE — Progress Notes (Signed)
Pre visit review using our clinic review tool, if applicable. No additional management support is needed unless otherwise documented below in the visit note. 

## 2015-12-27 NOTE — Patient Instructions (Addendum)
Please take a probiotic , Florastor  every day if the bowels are loose. This will replace the normal bacteria which  are necessary for formation of normal stool and processing of food. Plain Mucinex (NOT D) for thick secretions ;force NON dairy fluids .   Nasal cleansing in the shower as discussed with lather of mild shampoo.After 10 seconds wash off lather while  exhaling through nostrils. Make sure that all residual soap is removed to prevent irritation.  Fluticasone 1 spray in each nostril twice a day as needed. Use the "crossover" technique into opposite nostril spraying toward opposite ear @ 45 degree angle, not straight up into nostril. Plain Allegra (NOT D )  160 daily , Loratidine 10 mg , OR Zyrtec 10 mg @ bedtime  as needed for itchy eyes & sneezing.    Caries ( cavities) of the teeth can lead to significant local and systemic infections with threat to your health.Please have dental care completed as soon as possible.

## 2015-12-27 NOTE — Progress Notes (Signed)
   Subjective:    Patient ID: [name], female    DOB: 1950-11-14, 65 y.o.   MRN: [id number]  HPI   She saw Dr. Ernesto Rutherford, ENT 12/26/15 and was placed on clindamycin for presumed persistent sinusitis.  She had been hospitalized 2/20-2/23/17 with acute bronchiolitis with bronchospasm in the context of uncontrolled diabetes.   The CT scan in the hospital did reveal mucosal thickening in the maxillary sinuses as well as partial opacification of ethmoid sinuses and almost total opacification of the left sphenoid sinus.  She has  completed 10 days of Augmentin.  As of the last 3 days she's developed shortness of breath again with a dry cough. She's also had a 6 pound weight gain. She has pressure in the frontal and maxillary sinuses areas.  She describes loose stools without frank diarrhea. She also has itchy, watery eyes.  She is aware that she has a carious tooth but states that she does not have the funds to address this  Review of Systems Nasal purulence, dental pain, sore throat , otic pain or otic discharge denied. No fever , chills or sweats.Angioedema denied. There is no significant cough, sputum production, or wheezing. Unexplained weight loss, abdominal pain, significant dyspepsia, dysphagia, melena, rectal bleeding, or persistently small caliber stools are denied.   Objective:   Physical Exam Pertinent or positive findings include:  She has an upper plate. She has caries from below the gumline in the left anterior mandibular area. Abdomen is protuberant with striae. She uses a cane & has a rocking gait. Varus deformity of the knees is suggested clinically.   General appearance :Obese;adequately nourished; in no distress.  Eyes: No conjunctival inflammation or scleral icterus is present.  Oral exam:  Lips and gums are healthy appearing.There is no oropharyngeal erythema or exudate noted.   Heart:  Normal rate and regular rhythm. S1 and S2 normal without gallop, murmur,  click, rub or other extra sounds    Lungs:Chest clear to auscultation; no wheezes, rhonchi,rales ,or rubs present.No increased work of breathing.   Abdomen: bowel sounds normal, soft and non-tender without masses, organomegaly or hernias noted.  No guarding or rebound.   Vascular : all pulses equal ; no bruits present.  Skin:Warm & dry.  Intact without suspicious lesions or rashes ; no tenting or jaundice   Lymphatic: No lymphadenopathy is noted about the head, neck, axilla.   Neuro: Strength, tone decreased.     Assessment & Plan:   #1 left sphenoid sinusitis  #2 dyspnea  #3 weight gain  #4 loose stools in the context of protracted antibiotic therapy  #5 caries   plan: See orders & recommendations

## 2015-12-28 LAB — BASIC METABOLIC PANEL
BUN: 22 mg/dL (ref 6–23)
CO2: 26 mEq/L (ref 19–32)
Calcium: 9.6 mg/dL (ref 8.4–10.5)
Chloride: 108 mEq/L (ref 96–112)
Creatinine, Ser: 0.74 mg/dL (ref 0.40–1.20)
GFR: 84.02 mL/min (ref >60.00)
Glucose, Bld: 156 mg/dL — ABNORMAL HIGH (ref 70–99)
Potassium: 4.5 mEq/L (ref 3.5–5.1)
Sodium: 141 mEq/L (ref 135–145)

## 2015-12-28 LAB — TSH: TSH: 1.4 u[IU]/mL (ref 0.35–4.50)

## 2016-01-01 ENCOUNTER — Telehealth: Payer: Self-pay | Admitting: *Deleted

## 2016-01-01 ENCOUNTER — Ambulatory Visit: Payer: Commercial Managed Care - HMO | Admitting: Internal Medicine

## 2016-01-01 NOTE — Telephone Encounter (Signed)
Received call pt is wanting to pick up samples of Toujeo & testing strips. Had to reschedule appt to 01/09/16. Inform pt can leave sample on insulin, but we do not have samples on testing strips...Abigail Collins

## 2016-01-03 DIAGNOSIS — J320 Chronic maxillary sinusitis: Secondary | ICD-10-CM | POA: Diagnosis not present

## 2016-01-03 DIAGNOSIS — J322 Chronic ethmoidal sinusitis: Secondary | ICD-10-CM | POA: Diagnosis not present

## 2016-01-03 DIAGNOSIS — J040 Acute laryngitis: Secondary | ICD-10-CM | POA: Diagnosis not present

## 2016-01-03 DIAGNOSIS — J323 Chronic sphenoidal sinusitis: Secondary | ICD-10-CM | POA: Diagnosis not present

## 2016-01-09 ENCOUNTER — Ambulatory Visit (INDEPENDENT_AMBULATORY_CARE_PROVIDER_SITE_OTHER): Payer: PPO | Admitting: Internal Medicine

## 2016-01-09 ENCOUNTER — Encounter: Payer: Self-pay | Admitting: Internal Medicine

## 2016-01-09 VITALS — BP 118/80 | HR 98 | Temp 97.7°F | Resp 16 | Ht 64.0 in | Wt 242.0 lb

## 2016-01-09 DIAGNOSIS — IMO0002 Reserved for concepts with insufficient information to code with codable children: Secondary | ICD-10-CM

## 2016-01-09 DIAGNOSIS — J449 Chronic obstructive pulmonary disease, unspecified: Secondary | ICD-10-CM

## 2016-01-09 DIAGNOSIS — E1165 Type 2 diabetes mellitus with hyperglycemia: Secondary | ICD-10-CM | POA: Diagnosis not present

## 2016-01-09 DIAGNOSIS — E1149 Type 2 diabetes mellitus with other diabetic neurological complication: Secondary | ICD-10-CM | POA: Diagnosis not present

## 2016-01-09 DIAGNOSIS — I10 Essential (primary) hypertension: Secondary | ICD-10-CM

## 2016-01-09 MED ORDER — METFORMIN HCL 1000 MG PO TABS: 1000.0000 mg | ORAL_TABLET | Freq: Two times a day (BID) | ORAL | Status: DC

## 2016-01-09 MED ORDER — GLIMEPIRIDE 2 MG PO TABS: 2.0000 mg | ORAL_TABLET | Freq: Every day | ORAL | Status: DC

## 2016-01-09 MED ORDER — TOUJEO SOLOSTAR 300 UNIT/ML ~~LOC~~ SOPN: 50.0000 [IU] | PEN_INJECTOR | Freq: Every day | SUBCUTANEOUS | Status: DC

## 2016-01-09 MED ORDER — GLUCOSE BLOOD VI STRP: ORAL_STRIP | Status: DC

## 2016-01-09 MED ORDER — BUDESONIDE-FORMOTEROL FUMARATE 80-4.5 MCG/ACT IN AERO: 2.0000 | INHALATION_SPRAY | Freq: Two times a day (BID) | RESPIRATORY_TRACT | Status: DC

## 2016-01-09 NOTE — Progress Notes (Signed)
Pre visit review using our clinic review tool, if applicable. No additional management support is needed unless otherwise documented below in the visit note. 

## 2016-01-09 NOTE — Progress Notes (Signed)
Subjective:  Patient ID: Abigail Collins, female    DOB: 1951-05-19  Age: 65 y.o. MRN: [id number]  CC: Asthma; COPD; Hypertension; and Diabetes   HPI Abigail Collins presents for follow-up.  She was admitted about 4-6 weeks ago for COPD with exacerbation. She was briefly septic and developed a sinus infection. She was later seen by ENT and was placed on clindamycin. She is doing much better but still has episodes of wheezing. She is using an albuterol inhaler and is getting some symptom relief.  Outpatient Prescriptions Prior to Visit  Medication Sig Dispense Refill  . albuterol (PROVENTIL) (2.5 MG/3ML) 0.083% nebulizer solution Inhale 2.5 mg into the lungs every 6 (six) hours as needed for wheezing or shortness of breath.     . Blood Glucose Monitoring Suppl (ONETOUCH VERIO IQ SYSTEM) W/DEVICE KIT 1 Act by Does not apply route 3 (three) times daily. 2 kit 0  . ibuprofen (ADVIL,MOTRIN) 200 MG tablet Take 400 mg by mouth every 6 (six) hours as needed for headache or moderate pain.    . Azilsartan Medoxomil (EDARBI) 80 MG TABS Take 1 tablet (80 mg total) by mouth daily. 30 tablet 11  . benzonatate (TESSALON) 200 MG capsule Take 1 capsule (200 mg total) by mouth 3 (three) times daily as needed for cough (unrelieved by Robitussin DM). 45 capsule 0  . Blood Glucose Monitoring Suppl (FREESTYLE FREEDOM LITE) W/DEVICE KIT 1 Act by Does not apply route 3 (three) times daily. 2 each 0  . glimepiride (AMARYL) 2 MG tablet Take 1 tablet (2 mg total) by mouth daily before breakfast. 90 tablet 1  . glucose blood (FREESTYLE LITE) test strip Test up to TID dx  260.62 100 each 12  . glucose blood (ONETOUCH VERIO) test strip Use TID 240 each 0  . guaiFENesin-dextromethorphan (ROBITUSSIN DM) 100-10 MG/5ML syrup Take 15 mLs by mouth every 4 (four) hours as needed for cough. Sugar free    . metFORMIN (GLUCOPHAGE) 1000 MG tablet Take 1 tablet (1,000 mg total) by mouth 2 (two) times daily with a meal. 180  tablet 1  . predniSONE (DELTASONE) 10 MG tablet Take 30 mg tablet 2/24 and taper down by 10 mg daily until completed 6 tablet 0  . Pseudoephedrine-Ibuprofen (ADVIL COLD & SINUS LIQUI-GELS) 30-200 MG CAPS Take 1 capsule by mouth 2 (two) times daily as needed (cold symptoms).    [name] SOLOSTAR 300 UNIT/ML SOPN Inject 50 Units into the skin at bedtime.  1  . clindamycin (CLEOCIN) 150 MG capsule Take 1 capsule by mouth 3 (three) times daily. Reported on 01/09/2016  0  . fluconazole (DIFLUCAN) 150 MG tablet as needed. Reported on 01/09/2016  0  . insulin NPH Human (HUMULIN N) 100 UNIT/ML injection Inject 0.2 mLs (20 Units total) into the skin 2 (two) times daily before a meal. (Patient not taking: Reported on 01/09/2016) 10 mL 11   No facility-administered medications prior to visit.    ROS Review of Systems  Constitutional: Negative.  Negative for fever, chills, diaphoresis, appetite change and fatigue.  HENT: Negative.   Eyes: Negative.   Respiratory: Negative.  Negative for cough, choking, chest tightness, shortness of breath and stridor.   Cardiovascular: Negative.  Negative for chest pain, palpitations and leg swelling.  Gastrointestinal: Negative.  Negative for nausea, vomiting, abdominal pain, diarrhea and constipation.  Endocrine: Negative.  Negative for polydipsia, polyphagia and polyuria.  Genitourinary: Negative.   Musculoskeletal: Negative.  Negative for myalgias, back pain, joint swelling,  arthralgias and neck pain.  Skin: Negative.  Negative for color change and pallor.  Allergic/Immunologic: Negative.   Neurological: Negative.   Hematological: Negative.  Negative for adenopathy. Does not bruise/bleed easily.  Psychiatric/Behavioral: Negative.     Objective:  BP 118/80 mmHg  Pulse 98  Temp(Src) 97.7 F (36.5 C) (Oral)  Resp 16  Ht 5' 4"  (1.626 m)  Wt 242 lb (109.77 kg)  BMI 41.52 kg/m2  SpO2 98%  BP Readings from Last 3 Encounters:  01/09/16 118/80  12/27/15 130/88    12/06/15 148/74    Wt Readings from Last 3 Encounters:  01/09/16 242 lb (109.77 kg)  12/27/15 242 lb (109.77 kg)  12/03/15 236 lb (107.049 kg)    Physical Exam  Constitutional: She is oriented to person, place, and time.  Non-toxic appearance. She does not have a sickly appearance. She does not appear ill. No distress.  HENT:  Mouth/Throat: Oropharynx is clear and moist. No oropharyngeal exudate.  Eyes: Conjunctivae are normal. Right eye exhibits no discharge. Left eye exhibits no discharge. No scleral icterus.  Neck: Normal range of motion. Neck supple. No JVD present. No tracheal deviation present. No thyromegaly present.  Cardiovascular: Normal rate, regular rhythm, normal heart sounds and intact distal pulses.  Exam reveals no gallop and no friction rub.   No murmur heard. Pulmonary/Chest: Effort normal and breath sounds normal. No stridor. No respiratory distress. She has no wheezes. She has no rales. She exhibits no tenderness.  Abdominal: Soft. Bowel sounds are normal. She exhibits no distension and no mass. There is no tenderness. There is no rebound and no guarding.  Musculoskeletal: Normal range of motion. She exhibits no edema or tenderness.  Lymphadenopathy:    She has no cervical adenopathy.  Neurological: She is oriented to person, place, and time.  Skin: Skin is warm and dry. No rash noted. She is not diaphoretic. No erythema. No pallor.  Vitals reviewed.   Lab Results  Component Value Date   WBC 9.2 12/27/2015   HGB 14.1 12/27/2015   HCT 42.4 12/27/2015   PLT 170.0 12/27/2015   GLUCOSE 156* 12/27/2015   CHOL 181 03/28/2015   TRIG 443.0* 09/03/2015   HDL 53.50 03/28/2015   LDLDIRECT 75.0 03/28/2015   LDLCALC UNABLE TO CALCULATE IF TRIGLYCERIDE OVER 400 mg/dL 04/06/2012   ALT 23 12/04/2015   AST 34 12/04/2015   NA 141 12/27/2015   K 4.5 12/27/2015   CL 108 12/27/2015   CREATININE 0.74 12/27/2015   BUN 22 12/27/2015   CO2 26 12/27/2015   TSH 1.40  12/27/2015   INR 0.88 09/03/2012   HGBA1C 8.2* 12/03/2015   MICROALBUR 6.6* 03/28/2015    Dg Chest 2 View  12/28/2015  CLINICAL DATA:  Cough and chest pain for 3 weeks. No known injury. Initial encounter. EXAM: CHEST  2 VIEW COMPARISON:  PA and lateral chest and CT chest 12/03/2015. FINDINGS: The lungs are clear. Heart size is normal. No pneumothorax or pleural effusion. No focal bony abnormality. IMPRESSION: No acute disease. Electronically Signed   By: Inge Rise M.D.   On: 12/28/2015 09:03    Assessment & Plan:   Delancey was seen today for asthma, copd, hypertension and diabetes.  Diagnoses and all orders for this visit:  Diabetes mellitus with neurological manifestations, uncontrolled ([place])- her last A1c was up to 8.2%, I've given her samples of Toujeo and asked her to be more compliant with her diabetic regimen. She is also due for an annual exam. -  glimepiride (AMARYL) 2 MG tablet; Take 1 tablet (2 mg total) by mouth daily before breakfast. -     metFORMIN (GLUCOPHAGE) 1000 MG tablet; Take 1 tablet (1,000 mg total) by mouth 2 (two) times daily with a meal. -     Ambulatory referral to Ophthalmology -     TOUJEO SOLOSTAR 300 UNIT/ML SOPN; Inject 50 Units into the skin at bedtime. -     Discontinue: glucose blood (ONETOUCH VERIO) test strip; Use TID -     glucose blood (ONETOUCH VERIO) test strip; Use TID  HTN (hypertension), benign- her blood pressures well controlled, her recent electrolytes and renal function were stable. She will continue taking the ARB for blood pressure control.  COPD mixed type ([place])- I think she would benefit from starting an ICS/LABA combination. She can continue using albuterol as needed. -     budesonide-formoterol (SYMBICORT) 80-4.5 MCG/ACT inhaler; Inhale 2 puffs into the lungs 2 (two) times daily.  I have discontinued Ms. Witters's FREESTYLE FREEDOM LITE, Azilsartan Medoxomil, insulin NPH Human, guaiFENesin-dextromethorphan,  Pseudoephedrine-Ibuprofen, benzonatate, and predniSONE. I am also having her start on budesonide-formoterol. Additionally, I am having her maintain her [place] IQ SYSTEM, albuterol, ibuprofen, clindamycin, fluconazole, glimepiride, metFORMIN, TOUJEO SOLOSTAR, and glucose blood.  Meds ordered this encounter  Medications  . glimepiride (AMARYL) 2 MG tablet    Sig: Take 1 tablet (2 mg total) by mouth daily before breakfast.    Dispense:  90 tablet    Refill:  1  . metFORMIN (GLUCOPHAGE) 1000 MG tablet    Sig: Take 1 tablet (1,000 mg total) by mouth 2 (two) times daily with a meal.    Dispense:  180 tablet    Refill:  1  . budesonide-formoterol (SYMBICORT) 80-4.5 MCG/ACT inhaler    Sig: Inhale 2 puffs into the lungs 2 (two) times daily.    Dispense:  1 Inhaler    Refill:  11  . TOUJEO SOLOSTAR 300 UNIT/ML SOPN    Sig: Inject 50 Units into the skin at bedtime.    Dispense:  1.5 mL    Refill:  11  . DISCONTD: glucose blood (ONETOUCH VERIO) test strip    Sig: Use TID    Dispense:  240 each    Refill:  0  . glucose blood (ONETOUCH VERIO) test strip    Sig: Use TID    Dispense:  240 each    Refill:  11     Follow-up: Return in about 4 months (around 05/10/2016).  Scarlette Calico, MD

## 2016-01-09 NOTE — Patient Instructions (Signed)

## 2016-01-15 ENCOUNTER — Telehealth: Payer: Self-pay | Admitting: Internal Medicine

## 2016-01-15 NOTE — Telephone Encounter (Signed)
Patient states she just got back in town and that her legs and feet are so swollen that she can't get her shoes on and that she has never had them this swollen before.  I told patient she would need appointment but patient wanted note sent back first.

## 2016-01-16 NOTE — Telephone Encounter (Signed)
Gab can you call this pt.  She called again about her legs swelling.

## 2016-01-17 ENCOUNTER — Ambulatory Visit (INDEPENDENT_AMBULATORY_CARE_PROVIDER_SITE_OTHER): Payer: PPO | Admitting: Internal Medicine

## 2016-01-17 ENCOUNTER — Encounter: Payer: Self-pay | Admitting: Internal Medicine

## 2016-01-17 ENCOUNTER — Institutional Professional Consult (permissible substitution): Payer: PPO | Admitting: Pulmonary Disease

## 2016-01-17 VITALS — BP 160/102 | HR 92 | Temp 97.9°F | Resp 16 | Ht 64.0 in | Wt 242.0 lb

## 2016-01-17 DIAGNOSIS — I10 Essential (primary) hypertension: Secondary | ICD-10-CM

## 2016-01-17 MED ORDER — TELMISARTAN-HCTZ 80-12.5 MG PO TABS: 1.0000 | ORAL_TABLET | Freq: Every day | ORAL | Status: DC

## 2016-01-17 NOTE — Patient Instructions (Signed)
Hypertension Hypertension, commonly called high blood pressure, is when the force of blood pumping through your arteries is too strong. Your arteries are the blood vessels that carry blood from your heart throughout your body. A blood pressure reading consists of a higher number over a lower number, such as 110/72. The higher number (systolic) is the pressure inside your arteries when your heart pumps. The lower number (diastolic) is the pressure inside your arteries when your heart relaxes. Ideally you want your blood pressure below 120/80. Hypertension forces your heart to work harder to pump blood. Your arteries may become narrow or stiff. Having untreated or uncontrolled hypertension can cause heart attack, stroke, kidney disease, and other problems. RISK FACTORS Some risk factors for high blood pressure are controllable. Others are not.  Risk factors you cannot control include:   Race. You may be at higher risk if you are African American.  Age. Risk increases with age.  Gender. Men are at higher risk than women before age 45 years. After age 65, women are at higher risk than men. Risk factors you can control include:  Not getting enough exercise or physical activity.  Being overweight.  Getting too much fat, sugar, calories, or salt in your diet.  Drinking too much alcohol. SIGNS AND SYMPTOMS Hypertension does not usually cause signs or symptoms. Extremely high blood pressure (hypertensive crisis) may cause headache, anxiety, shortness of breath, and nosebleed. DIAGNOSIS To check if you have hypertension, your health care provider will measure your blood pressure while you are seated, with your arm held at the level of your heart. It should be measured at least twice using the same arm. Certain conditions can cause a difference in blood pressure between your right and left arms. A blood pressure reading that is higher than normal on one occasion does not mean that you need treatment. If  it is not clear whether you have high blood pressure, you may be asked to return on a different day to have your blood pressure checked again. Or, you may be asked to monitor your blood pressure at home for 1 or more weeks. TREATMENT Treating high blood pressure includes making lifestyle changes and possibly taking medicine. Living a healthy lifestyle can help lower high blood pressure. You may need to change some of your habits. Lifestyle changes may include:  Following the DASH diet. This diet is high in fruits, vegetables, and whole grains. It is low in salt, red meat, and added sugars.  Keep your sodium intake below 2,300 mg per day.  Getting at least 30-45 minutes of aerobic exercise at least 4 times per week.  Losing weight if necessary.  Not smoking.  Limiting alcoholic beverages.  Learning ways to reduce stress. Your health care provider may prescribe medicine if lifestyle changes are not enough to get your blood pressure under control, and if one of the following is true:  You are 18-59 years of age and your systolic blood pressure is above 140.  You are 60 years of age or older, and your systolic blood pressure is above 150.  Your diastolic blood pressure is above 90.  You have diabetes, and your systolic blood pressure is over 140 or your diastolic blood pressure is over 90.  You have kidney disease and your blood pressure is above 140/90.  You have heart disease and your blood pressure is above 140/90. Your personal target blood pressure may vary depending on your medical conditions, your age, and other factors. HOME CARE INSTRUCTIONS    Have your blood pressure rechecked as directed by your health care provider.   Take medicines only as directed by your health care provider. Follow the directions carefully. Blood pressure medicines must be taken as prescribed. The medicine does not work as well when you skip doses. Skipping doses also puts you at risk for  problems.  Do not smoke.   Monitor your blood pressure at home as directed by your health care provider. SEEK MEDICAL CARE IF:   You think you are having a reaction to medicines taken.  You have recurrent headaches or feel dizzy.  You have swelling in your ankles.  You have trouble with your vision. SEEK IMMEDIATE MEDICAL CARE IF:  You develop a severe headache or confusion.  You have unusual weakness, numbness, or feel faint.  You have severe chest or abdominal pain.  You vomit repeatedly.  You have trouble breathing. MAKE SURE YOU:   Understand these instructions.  Will watch your condition.  Will get help right away if you are not doing well or get worse.   This information is not intended to replace advice given to you by your health care provider. Make sure you discuss any questions you have with your health care provider.   Document Released: 09/29/2005 Document Revised: 02/13/2015 Document Reviewed: 07/22/2013 Elsevier Interactive Patient Education 2016 [employer] Inc[employer]  

## 2016-01-17 NOTE — Telephone Encounter (Signed)
Per PCP she needs an appt, Cecille Rubin can you get her on the books?

## 2016-01-17 NOTE — Telephone Encounter (Signed)
appt made

## 2016-01-17 NOTE — Progress Notes (Signed)
Pre visit review using our clinic review tool, if applicable. No additional management support is needed unless otherwise documented below in the visit note. 

## 2016-01-20 NOTE — Progress Notes (Signed)
Subjective:  Patient ID: Abigail Collins, female    DOB: 12-Jun-1951  Age: 65 y.o. MRN: [id number]  CC: Hypertension   HPI Abigail Collins presents for a blood pressure check as well as concerns about edema in her ankles and feet. She tells me that about 6 days ago she went to a grandson's graduation and she admits to some dietary indiscretions including drinking 2 margaritas. She states over the ensuing 2 days she developed edema in her ankles and feet. She also tells me that she did not take her blood pressure medicine with her on this trip. She states over the last 2 or 3 days the edema has finally resolved. She denies headache, chest pain, shortness of breath, palpitations, blurred vision, or syncope.  Outpatient Prescriptions Prior to Visit  Medication Sig Dispense Refill  . Blood Glucose Monitoring Suppl (ONETOUCH VERIO IQ SYSTEM) W/DEVICE KIT 1 Act by Does not apply route 3 (three) times daily. 2 kit 0  . budesonide-formoterol (SYMBICORT) 80-4.5 MCG/ACT inhaler Inhale 2 puffs into the lungs 2 (two) times daily. 1 Inhaler 11  . glimepiride (AMARYL) 2 MG tablet Take 1 tablet (2 mg total) by mouth daily before breakfast. 90 tablet 1  . glucose blood (ONETOUCH VERIO) test strip Use TID 240 each 11  . metFORMIN (GLUCOPHAGE) 1000 MG tablet Take 1 tablet (1,000 mg total) by mouth 2 (two) times daily with a meal. 180 tablet 1  . TOUJEO SOLOSTAR 300 UNIT/ML SOPN Inject 50 Units into the skin at bedtime. 1.5 mL 11  . ibuprofen (ADVIL,MOTRIN) 200 MG tablet Take 400 mg by mouth every 6 (six) hours as needed for headache or moderate pain.    Marland Kitchen albuterol (PROVENTIL) (2.5 MG/3ML) 0.083% nebulizer solution Inhale 2.5 mg into the lungs every 6 (six) hours as needed for wheezing or shortness of breath. Reported on 01/17/2016    . fluconazole (DIFLUCAN) 150 MG tablet as needed. Reported on 01/17/2016  0  . clindamycin (CLEOCIN) 150 MG capsule Take 1 capsule by mouth 3 (three) times daily. Reported on  01/17/2016  0   No facility-administered medications prior to visit.    ROS Review of Systems  Constitutional: Negative.  Negative for chills and fatigue.  HENT: Negative.   Eyes: Negative.   Respiratory: Negative.  Negative for cough, choking, chest tightness, shortness of breath and stridor.   Cardiovascular: Negative.  Negative for chest pain, palpitations and leg swelling.  Gastrointestinal: Negative.  Negative for nausea, vomiting, abdominal pain, diarrhea, constipation and blood in stool.  Endocrine: Negative.   Genitourinary: Negative.  Negative for dysuria, urgency, decreased urine volume and difficulty urinating.  Musculoskeletal: Negative.  Negative for myalgias, back pain, joint swelling and arthralgias.  Skin: Negative.  Negative for color change, pallor and rash.  Allergic/Immunologic: Negative.   Neurological: Negative.  Negative for dizziness, tremors, weakness, light-headedness and headaches.  Hematological: Negative.  Negative for adenopathy. Does not bruise/bleed easily.  Psychiatric/Behavioral: Negative.     Objective:  BP 160/102 mmHg  Pulse 92  Temp(Src) 97.9 F (36.6 C) (Oral)  Resp 16  Ht _0  (1.626 m)  Wt 242 lb (109.77 kg)  BMI 41.52 kg/m2  SpO2 97%  BP Readings from Last 3 Encounters:  01/17/16 160/102  01/09/16 118/80  12/27/15 130/88    Wt Readings from Last 3 Encounters:  01/17/16 242 lb (109.77 kg)  01/09/16 242 lb (109.77 kg)  12/27/15 242 lb (109.77 kg)    Physical Exam  Constitutional: She is oriented  to person, place, and time. She appears well-developed and well-nourished. No distress.  HENT:  Head: Normocephalic and atraumatic.  Mouth/Throat: Oropharynx is clear and moist. No oropharyngeal exudate.  Eyes: Conjunctivae are normal. Right eye exhibits no discharge. Left eye exhibits no discharge. No scleral icterus.  Neck: Normal range of motion. Neck supple. No JVD present. No tracheal deviation present. No thyromegaly present.    Cardiovascular: Normal rate, regular rhythm, normal heart sounds and intact distal pulses.  Exam reveals no gallop and no friction rub.   No murmur heard. Pulmonary/Chest: Effort normal and breath sounds normal. No stridor. No respiratory distress. She has no wheezes. She has no rales. She exhibits no tenderness.  Abdominal: Soft. Bowel sounds are normal. She exhibits no distension and no mass. There is no tenderness. There is no rebound and no guarding.  Musculoskeletal: Normal range of motion. She exhibits no edema or tenderness.  Lymphadenopathy:    She has no cervical adenopathy.  Neurological: She is oriented to person, place, and time.  Skin: Skin is warm and dry. No rash noted. She is not diaphoretic. No erythema. No pallor.  Vitals reviewed.   Lab Results  Component Value Date   WBC 9.2 12/27/2015   HGB 14.1 12/27/2015   HCT 42.4 12/27/2015   PLT 170.0 12/27/2015   GLUCOSE 156* 12/27/2015   CHOL 181 03/28/2015   TRIG 443.0* 09/03/2015   HDL 53.50 03/28/2015   LDLDIRECT 75.0 03/28/2015   LDLCALC UNABLE TO CALCULATE IF TRIGLYCERIDE OVER 400 mg/dL 04/06/2012   ALT 23 12/04/2015   AST 34 12/04/2015   NA 141 12/27/2015   K 4.5 12/27/2015   CL 108 12/27/2015   CREATININE 0.74 12/27/2015   BUN 22 12/27/2015   CO2 26 12/27/2015   TSH 1.40 12/27/2015   INR 0.88 09/03/2012   HGBA1C 8.2* 12/03/2015   MICROALBUR 6.6* 03/28/2015    Dg Chest 2 View  12/28/2015  CLINICAL DATA:  Cough and chest pain for 3 weeks. No known injury. Initial encounter. EXAM: CHEST  2 VIEW COMPARISON:  PA and lateral chest and CT chest 12/03/2015. FINDINGS: The lungs are clear. Heart size is normal. No pneumothorax or pleural effusion. No focal bony abnormality. IMPRESSION: No acute disease. Electronically Signed   By: Abigail Collins M.D.   On: 12/28/2015 09:03    Assessment & Plan:   Abigail Collins was seen today for hypertension.  Diagnoses and all orders for this visit:  HTN (hypertension), benign -  her blood pressure is not well controlled due to noncompliance with medications as well as intake of sodium, the high sodium intake explains her edema and the edema has resolved, I've asked her to be more compliant with af low sodium diet and to restart an ARB and hydrochlorothiazide. Her prior ARB HCTZ combination is not covered on her current insurance so I made a change to a new combination  -     telmisartan-hydrochlorothiazide (MICARDIS HCT) 80-12.5 MG tablet; Take 1 tablet by mouth daily.   I have discontinued Ms. Sinquefield's ibuprofen and clindamycin. I am also having her start on telmisartan-hydrochlorothiazide. Additionally, I am having her maintain her [place] IQ SYSTEM, albuterol, fluconazole, glimepiride, metFORMIN, budesonide-formoterol, TOUJEO SOLOSTAR, and glucose blood.  Meds ordered this encounter  Medications  . telmisartan-hydrochlorothiazide (MICARDIS HCT) 80-12.5 MG tablet    Sig: Take 1 tablet by mouth daily.    Dispense:  90 tablet    Refill:  1     Follow-up: Return in about 2 months (  around 03/18/2016).  Scarlette Calico, MD

## 2016-01-31 DIAGNOSIS — M1712 Unilateral primary osteoarthritis, left knee: Secondary | ICD-10-CM | POA: Diagnosis not present

## 2016-02-12 ENCOUNTER — Institutional Professional Consult (permissible substitution): Payer: PPO | Admitting: Pulmonary Disease

## 2016-05-06 DIAGNOSIS — M25562 Pain in left knee: Secondary | ICD-10-CM | POA: Diagnosis not present

## 2016-05-06 DIAGNOSIS — G8929 Other chronic pain: Secondary | ICD-10-CM | POA: Diagnosis not present

## 2016-05-07 ENCOUNTER — Telehealth: Payer: Self-pay | Admitting: Internal Medicine

## 2016-05-07 NOTE — Telephone Encounter (Signed)
Patient was prescribed diclofenac for arthritis by her knee doctor.  Patient would like to know if this would be ok for her to take being diabetic?

## 2016-05-07 NOTE — Telephone Encounter (Signed)
yes

## 2016-05-14 ENCOUNTER — Ambulatory Visit: Payer: PPO | Admitting: Internal Medicine

## 2016-05-15 NOTE — Telephone Encounter (Signed)
Pt informed of MD response.   Pt states that she has bumps all over her head. She is not sure what is causing the itchy/sore places on her hear.   Offered to schedule her for an appt. Pt states that she is not able to come in. Pt will stop the medication if it gets bad enough. Pt has an upcoming appt with PCP.

## 2016-05-27 ENCOUNTER — Other Ambulatory Visit (INDEPENDENT_AMBULATORY_CARE_PROVIDER_SITE_OTHER): Payer: PPO

## 2016-05-27 ENCOUNTER — Encounter: Payer: Self-pay | Admitting: Internal Medicine

## 2016-05-27 ENCOUNTER — Ambulatory Visit (INDEPENDENT_AMBULATORY_CARE_PROVIDER_SITE_OTHER): Payer: PPO | Admitting: Internal Medicine

## 2016-05-27 VITALS — BP 138/82 | HR 90 | Temp 97.8°F | Resp 16 | Ht 64.0 in | Wt 242.8 lb

## 2016-05-27 DIAGNOSIS — E781 Pure hyperglyceridemia: Secondary | ICD-10-CM | POA: Diagnosis not present

## 2016-05-27 DIAGNOSIS — I10 Essential (primary) hypertension: Secondary | ICD-10-CM

## 2016-05-27 DIAGNOSIS — E785 Hyperlipidemia, unspecified: Secondary | ICD-10-CM

## 2016-05-27 DIAGNOSIS — E1149 Type 2 diabetes mellitus with other diabetic neurological complication: Secondary | ICD-10-CM

## 2016-05-27 DIAGNOSIS — Z1231 Encounter for screening mammogram for malignant neoplasm of breast: Secondary | ICD-10-CM

## 2016-05-27 DIAGNOSIS — R7989 Other specified abnormal findings of blood chemistry: Secondary | ICD-10-CM

## 2016-05-27 DIAGNOSIS — G4719 Other hypersomnia: Secondary | ICD-10-CM

## 2016-05-27 DIAGNOSIS — E1165 Type 2 diabetes mellitus with hyperglycemia: Secondary | ICD-10-CM

## 2016-05-27 DIAGNOSIS — IMO0002 Reserved for concepts with insufficient information to code with codable children: Secondary | ICD-10-CM

## 2016-05-27 LAB — COMPREHENSIVE METABOLIC PANEL
ALT: 22 U/L (ref 0–35)
AST: 21 U/L (ref 0–37)
Albumin: 4.2 g/dL (ref 3.5–5.2)
Alkaline Phosphatase: 56 U/L (ref 39–117)
BUN: 21 mg/dL (ref 6–23)
CO2: 29 mEq/L (ref 19–32)
Calcium: 9.8 mg/dL (ref 8.4–10.5)
Chloride: 105 mEq/L (ref 96–112)
Creatinine, Ser: 0.75 mg/dL (ref 0.40–1.20)
GFR: 82.62 mL/min (ref >60.00)
Glucose, Bld: 135 mg/dL — ABNORMAL HIGH (ref 70–99)
Potassium: 4.4 mEq/L (ref 3.5–5.1)
Sodium: 142 mEq/L (ref 135–145)
Total Bilirubin: 0.5 mg/dL (ref 0.2–1.2)
Total Protein: 7.1 g/dL (ref 6.0–8.3)

## 2016-05-27 LAB — LIPID PANEL
Cholesterol: 171 mg/dL (ref 0–200)
HDL: 57.4 mg/dL (ref >39.00)
NonHDL: 113.35
Total CHOL/HDL Ratio: 3
Triglycerides: 241 mg/dL — ABNORMAL HIGH (ref 0.0–149.0)
VLDL: 48.2 mg/dL — ABNORMAL HIGH (ref 0.0–40.0)

## 2016-05-27 LAB — MAY USE HOME CBG MONITOR: CBG: 149

## 2016-05-27 LAB — MICROALBUMIN / CREATININE URINE RATIO
Creatinine,U: 297.9 mg/dL
Microalb Creat Ratio: 2 mg/g (ref 0.0–30.0)
Microalb, Ur: 6.1 mg/dL — ABNORMAL HIGH (ref 0.0–1.9)

## 2016-05-27 LAB — LDL CHOLESTEROL, DIRECT: Direct LDL: 74 mg/dL

## 2016-05-27 LAB — HEMOGLOBIN A1C: Hgb A1c MFr Bld: 8.2 % — ABNORMAL HIGH (ref 4.6–6.5)

## 2016-05-27 MED ORDER — INSULIN PEN NEEDLE 32G X 6 MM MISC: 1.0000 | Freq: Every day | 3 refills | Status: DC

## 2016-05-27 NOTE — Progress Notes (Signed)
Pre visit review using our clinic review tool, if applicable. No additional management support is needed unless otherwise documented below in the visit note. 

## 2016-05-27 NOTE — Patient Instructions (Signed)

## 2016-05-27 NOTE — Progress Notes (Signed)
 Subjective:  Patient ID: Abigail Collins, female    DOB: 10/11/1951  Age: 65 y.o. MRN: [id number]  CC: Hypertension; Diabetes; and Hyperlipidemia   HPI Karema D Reitan presents for follow-up on multiple medical problems. She complains of fatigue and excessive daytime sleepiness. She snores but doesn't think that she has apnea. She is not been monitoring her blood sugar so doesn't know if she has had good blood sugar control. She denies polyuria/polydipsia/polyphagia. She does think she's had good blood pressure control and his had no recent episodes of headache/blurred vision/chest pain/shortness of breath/or edema.  Outpatient Medications Prior to Visit  Medication Sig Dispense Refill  . albuterol (PROVENTIL) (2.5 MG/3ML) 0.083% nebulizer solution Inhale 2.5 mg into the lungs every 6 (six) hours as needed for wheezing or shortness of breath. Reported on 01/17/2016    . Blood Glucose Monitoring Suppl (ONETOUCH VERIO IQ SYSTEM) W/DEVICE KIT 1 Act by Does not apply route 3 (three) times daily. 2 kit 0  . budesonide-formoterol (SYMBICORT) 80-4.5 MCG/ACT inhaler Inhale 2 puffs into the lungs 2 (two) times daily. 1 Inhaler 11  . glimepiride (AMARYL) 2 MG tablet Take 1 tablet (2 mg total) by mouth daily before breakfast. 90 tablet 1  . glucose blood (ONETOUCH VERIO) test strip Use TID 240 each 11  . metFORMIN (GLUCOPHAGE) 1000 MG tablet Take 1 tablet (1,000 mg total) by mouth 2 (two) times daily with a meal. 180 tablet 1  . telmisartan-hydrochlorothiazide (MICARDIS HCT) 80-12.5 MG tablet Take 1 tablet by mouth daily. 90 tablet 1  . TOUJEO SOLOSTAR 300 UNIT/ML SOPN Inject 50 Units into the skin at bedtime. 1.5 mL 11  . fluconazole (DIFLUCAN) 150 MG tablet as needed. Reported on 01/17/2016  0   No facility-administered medications prior to visit.     ROS Review of Systems  Constitutional: Positive for fatigue. Negative for activity change, appetite change, diaphoresis and unexpected weight  change.  HENT: Negative.   Eyes: Negative.  Negative for visual disturbance.  Respiratory: Negative.  Negative for cough, choking, chest tightness, shortness of breath and stridor.   Cardiovascular: Negative.  Negative for chest pain, palpitations and leg swelling.  Gastrointestinal: Negative.  Negative for abdominal pain, constipation, diarrhea, nausea and vomiting.  Endocrine: Negative.  Negative for polydipsia, polyphagia and polyuria.  Genitourinary: Negative.  Negative for difficulty urinating, dysuria, frequency and hematuria.  Musculoskeletal: Positive for arthralgias. Negative for back pain, gait problem, myalgias and neck pain.  Skin: Negative.  Negative for color change and rash.  Allergic/Immunologic: Negative.   Neurological: Negative.  Negative for dizziness, tremors, weakness and numbness.  Hematological: Negative.  Negative for adenopathy. Does not bruise/bleed easily.  Psychiatric/Behavioral: Negative.     Objective:  BP 138/82 (BP Location: Left Arm, Patient Position: Sitting, Cuff Size: Normal)   Pulse 90   Temp 97.8 F (36.6 C) (Oral)   Resp 16   Ht 5' 4" (1.626 m)   Wt 242 lb 12 oz (110.1 kg)   SpO2 95%   BMI 41.67 kg/m   BP Readings from Last 3 Encounters:  05/27/16 138/82  01/17/16 (!) 160/102  01/09/16 118/80    Wt Readings from Last 3 Encounters:  05/27/16 242 lb 12 oz (110.1 kg)  01/17/16 242 lb (109.8 kg)  01/09/16 242 lb (109.8 kg)    Physical Exam  Constitutional: She is oriented to person, place, and time. No distress.  HENT:  Mouth/Throat: Oropharynx is clear and moist. No oropharyngeal exudate.  Eyes: Conjunctivae are normal. Right   eye exhibits no discharge. Left eye exhibits no discharge. No scleral icterus.  Neck: Normal range of motion. Neck supple. No JVD present. No tracheal deviation present. No thyromegaly present.  Cardiovascular: Normal rate, regular rhythm, normal heart sounds and intact distal pulses.  Exam reveals no gallop and  no friction rub.   No murmur heard. Pulmonary/Chest: Effort normal and breath sounds normal. No stridor. No respiratory distress. She has no wheezes. She has no rales. She exhibits no tenderness.  Abdominal: Soft. Bowel sounds are normal. She exhibits no distension and no mass. There is no tenderness. There is no rebound and no guarding.  Musculoskeletal: Normal range of motion. She exhibits no edema, tenderness or deformity.  Lymphadenopathy:    She has no cervical adenopathy.  Neurological: She is oriented to person, place, and time.  Skin: Skin is warm and dry. No rash noted. She is not diaphoretic. No erythema. No pallor.  Vitals reviewed.   Lab Results  Component Value Date   WBC 9.2 12/27/2015   HGB 14.1 12/27/2015   HCT 42.4 12/27/2015   PLT 170.0 12/27/2015   GLUCOSE 135 (H) 05/27/2016   CHOL 171 05/27/2016   TRIG 241.0 (H) 05/27/2016   HDL 57.40 05/27/2016   LDLDIRECT 74.0 05/27/2016   LDLCALC UNABLE TO CALCULATE IF TRIGLYCERIDE OVER 400 mg/dL 04/06/2012   ALT 22 05/27/2016   AST 21 05/27/2016   NA 142 05/27/2016   K 4.4 05/27/2016   CL 105 05/27/2016   CREATININE 0.75 05/27/2016   BUN 21 05/27/2016   CO2 29 05/27/2016   TSH 1.40 12/27/2015   INR 0.88 09/03/2012   HGBA1C 8.2 (H) 05/27/2016   MICROALBUR 6.1 (H) 05/27/2016    Dg Chest 2 View  Result Date: 12/28/2015 CLINICAL DATA:  Cough and chest pain for 3 weeks. No known injury. Initial encounter. EXAM: CHEST  2 VIEW COMPARISON:  PA and lateral chest and CT chest 12/03/2015. FINDINGS: The lungs are clear. Heart size is normal. No pneumothorax or pleural effusion. No focal bony abnormality. IMPRESSION: No acute disease. Electronically Signed   By: Thomas  Dalessio M.D.   On: 12/28/2015 09:03    Assessment & Plan:   Rodnisha was seen today for hypertension, diabetes and hyperlipidemia.  Diagnoses and all orders for this visit:  HTN (hypertension), benign- her blood pressure is well controlled, electrolytes and  renal function are stable. -     Comprehensive metabolic panel; Future  Diabetes mellitus with neurological manifestations, uncontrolled (HCC)- her A1c is up to 8.2%, she tells me this is the best control that she can get with her blood sugars, will continue the current medications and she will work on her lifestyle modifications. -     Comprehensive metabolic panel; Future -     Hemoglobin A1c; Future -     Microalbumin / creatinine urine ratio; Future -     Ambulatory referral to Ophthalmology -     Insulin Pen Needle (NOVOFINE) 32G X 6 MM MISC; 1 Act by Does not apply route daily. -     MAY USE HOME CBG MONITOR  Hyperlipidemia with target LDL less than 100- she has achieved her LDL goal -     Lipid panel; Future  Morbid obesity due to excess calories (HCC)- she agrees to work on her caloric intake and exercise regimen to lose weight.  Pure hyperglyceridemia- improvement noted. -     Lipid panel; Future  Excessive daytime sleepiness- I've asked her to see sleep medicine to see   if she has a sleep disorder. -     Ambulatory referral to Pulmonology  Visit for screening mammogram -     MM DIGITAL SCREENING BILATERAL; Future   I have discontinued Ms. Doolittle's fluconazole and betamethasone acetate-betamethasone sodium phosphate. I am also having her start on Insulin Pen Needle. Additionally, I am having her maintain her ONETOUCH VERIO IQ SYSTEM, albuterol, glimepiride, metFORMIN, budesonide-formoterol, TOUJEO SOLOSTAR, glucose blood, telmisartan-hydrochlorothiazide, and diclofenac.  Meds ordered this encounter  Medications  . diclofenac (CATAFLAM) 50 MG tablet    Sig: Take 50 mg by mouth.  . DISCONTD: betamethasone acetate-betamethasone sodium phosphate (CELESTONE) 6 (3-3) MG/ML injection    Sig: Inject 12 mg into the articular space every 3 (three) months.  . Insulin Pen Needle (NOVOFINE) 32G X 6 MM MISC    Sig: 1 Act by Does not apply route daily.    Dispense:  100 each    Refill:   3     Follow-up: Return in about 4 months (around 09/26/2016).  Thomas Jones, MD 

## 2016-05-29 ENCOUNTER — Ambulatory Visit: Payer: PPO | Admitting: Internal Medicine

## 2016-06-19 ENCOUNTER — Encounter: Payer: Self-pay | Admitting: Internal Medicine

## 2016-06-19 ENCOUNTER — Ambulatory Visit (INDEPENDENT_AMBULATORY_CARE_PROVIDER_SITE_OTHER)
Admission: RE | Admit: 2016-06-19 | Discharge: 2016-06-19 | Disposition: A | Discharge status: Home / Self Care | Payer: PPO | Source: Ambulatory Visit | Attending: Internal Medicine | Admitting: Internal Medicine

## 2016-06-19 ENCOUNTER — Ambulatory Visit (INDEPENDENT_AMBULATORY_CARE_PROVIDER_SITE_OTHER): Payer: PPO | Admitting: Internal Medicine

## 2016-06-19 VITALS — BP 130/70 | HR 102 | Temp 97.7°F | Resp 20 | Wt 245.0 lb

## 2016-06-19 DIAGNOSIS — E1149 Type 2 diabetes mellitus with other diabetic neurological complication: Secondary | ICD-10-CM

## 2016-06-19 DIAGNOSIS — I10 Essential (primary) hypertension: Secondary | ICD-10-CM

## 2016-06-19 DIAGNOSIS — E1165 Type 2 diabetes mellitus with hyperglycemia: Secondary | ICD-10-CM

## 2016-06-19 DIAGNOSIS — IMO0002 Reserved for concepts with insufficient information to code with codable children: Secondary | ICD-10-CM

## 2016-06-19 DIAGNOSIS — R05 Cough: Secondary | ICD-10-CM

## 2016-06-19 DIAGNOSIS — J029 Acute pharyngitis, unspecified: Secondary | ICD-10-CM | POA: Diagnosis not present

## 2016-06-19 DIAGNOSIS — R059 Cough, unspecified: Secondary | ICD-10-CM

## 2016-06-19 MED ORDER — LEVOFLOXACIN 500 MG PO TABS: 500.0000 mg | ORAL_TABLET | Freq: Every day | ORAL | 0 refills | Status: AC

## 2016-06-19 MED ORDER — BENZONATATE 100 MG PO CAPS: ORAL_CAPSULE | ORAL | 1 refills | Status: DC

## 2016-06-19 NOTE — Progress Notes (Signed)
Subjective:    Patient ID: Abigail Collins, female    DOB: 08-25-1951, 65 y.o.   MRN: [id number]  HPI   Here with 2-3 days acute onset fever, severe ST, milder facial pain, pressure, headache, general weakness and malaise, and scant prod but persistent cough unable to sleep,, but pt denies chest pain, wheezing, increased sob or doe, orthopnea, PND, increased LE swelling, palpitations, dizziness or syncope. Pt denies new neurological symptoms such as new headache, or facial or extremity weakness or numbness   Pt denies polydipsia, polyuria Past Medical History:  Diagnosis Date  . Anemia    hx of  . Anginal pain ([place])    "left arm pain, sees Dr. Etter Sjogren, had card cath 2013"  . Arthritis    "all over" (09/14/2012)  . Asthma    takes inhaler 2x day  . Bronchitis    hx of  . Bulging disc    "lower"  . Carpal tunnel syndrome of left wrist   . Depression    denies  . Exertional dyspnea    "sometimes laying down" (09/14/2012)  . Hyperlipidemia   . Hypertension    sees Dr. Debby Freiberg, primary  . Pneumonia 03/2012  . PONV (postoperative nausea and vomiting)   . Thyroid disease 1960's   "don't have it now" (09/14/2012)  . Type II diabetes mellitus ([place])   . Urinary tract infection    hx of  . Vomiting    pt states she vomits every am   Past Surgical History:  Procedure Laterality Date  . CARDIAC CATHETERIZATION  05/13/2012   mod luminal irregularity of pLAD, no sign CAD, EF 65%.  . CHOLECYSTECTOMY  1970's  . KYPHOPLASTY N/A 02/10/2013   Procedure:  LUMBAR TWO KYPHOPLASTY;  Surgeon: Ophelia Charter, MD;  Location: [facility];  Service: Neurosurgery;  Laterality: N/A;  L2 Kyphoplasty; Will use Stern's Carm.  Marland Kitchen TOTAL ELBOW REPLACEMENT  ~ 2005   "right" (09/14/2012)  . TOTAL KNEE ARTHROPLASTY  09/13/2012   Procedure: TOTAL KNEE ARTHROPLASTY;  Surgeon: Vickey Huger, MD;  Location: [facility];  Service: Orthopedics;  Laterality: Right;  . TUBAL LIGATION  1970's  . VAGINAL HYSTERECTOMY   1970's    reports that she quit smoking about 6 years ago. Her smoking use included Cigarettes. She has a 5.00 pack-year smoking history. She has never used smokeless tobacco. She reports that she does not drink alcohol or use drugs. family history includes Arthritis in her father and mother; Diabetes in her father; Hypertension in her father. Allergies  Allergen Reactions  . Lisinopril Shortness Of Breath  . Amlodipine     edema  . Tape Other (See Comments)    Tore skin--?hypofix  . Percocet [Oxycodone-Acetaminophen] Nausea And Vomiting   Current Outpatient Prescriptions on File Prior to Visit  Medication Sig Dispense Refill  . albuterol (PROVENTIL) (2.5 MG/3ML) 0.083% nebulizer solution Inhale 2.5 mg into the lungs every 6 (six) hours as needed for wheezing or shortness of breath. Reported on 01/17/2016    . Blood Glucose Monitoring Suppl (ONETOUCH VERIO IQ SYSTEM) W/DEVICE KIT 1 Act by Does not apply route 3 (three) times daily. 2 kit 0  . budesonide-formoterol (SYMBICORT) 80-4.5 MCG/ACT inhaler Inhale 2 puffs into the lungs 2 (two) times daily. 1 Inhaler 11  . diclofenac (CATAFLAM) 50 MG tablet Take 50 mg by mouth.    Marland Kitchen glimepiride (AMARYL) 2 MG tablet Take 1 tablet (2 mg total) by mouth daily before breakfast. 90 tablet 1  .  glucose blood (ONETOUCH VERIO) test strip Use TID 240 each 11  . Insulin Pen Needle (NOVOFINE) 32G X 6 MM MISC 1 Act by Does not apply route daily. 100 each 3  . metFORMIN (GLUCOPHAGE) 1000 MG tablet Take 1 tablet (1,000 mg total) by mouth 2 (two) times daily with a meal. 180 tablet 1  . telmisartan-hydrochlorothiazide (MICARDIS HCT) 80-12.5 MG tablet Take 1 tablet by mouth daily. 90 tablet 1  . TOUJEO SOLOSTAR 300 UNIT/ML SOPN Inject 50 Units into the skin at bedtime. 1.5 mL 11   No current facility-administered medications on file prior to visit.    Review of Systems  Constitutional: Negative for unusual diaphoresis or night sweats HENT: Negative for ear  swelling or discharge Eyes: Negative for worsening visual haziness  Respiratory: Negative for choking and stridor.   Gastrointestinal: Negative for distension or worsening eructation Genitourinary: Negative for retention or change in urine volume.  Musculoskeletal: Negative for other MSK pain or swelling Skin: Negative for color change and worsening wound Neurological: Negative for tremors and numbness other than noted  Psychiatric/Behavioral: Negative for decreased concentration or agitation other than above       Objective:   Physical Exam BP 130/70   Pulse (!) 102   Temp 97.7 F (36.5 C) (Oral)   Resp 20   Wt 245 lb (111.1 kg)   SpO2 95%   BMI 42.05 kg/m  VS noted, mild ill Constitutional: Pt appears in no apparent distress HENT: Head: NCAT.  Right Ear: External ear normal.  Left Ear: External ear normal.  Bilat tm's with mild erythema.  Max sinus areas mild tender.  Pharynx with severe erythema, no exudate Eyes: . Pupils are equal, round, and reactive to light. Conjunctivae and EOM are normal Neck: Normal range of motion. Neck supple.  Cardiovascular: Normal rate and regular rhythm.   Pulmonary/Chest: Effort normal and breath sounds without rales or wheezing.  Neurological: Pt is alert. Not confused , motor grossly intact Skin: Skin is warm. No rash, no LE edema Psychiatric: Pt behavior is normal. No agitation.     Assessment & Plan:

## 2016-06-19 NOTE — Patient Instructions (Signed)
Please take all new medication as prescribed - the antibiotic  Please continue all other medications as before, and refills have been done if requested - the benzonatate  Please have the pharmacy call with any other refills you may need.  Please keep your appointments with your specialists as you may have planned  Please go to the XRAY Department in the Basement (go straight as you get off the elevator) for the x-ray testing  You will be contacted by phone if any changes need to be made immediately.  Otherwise, you will receive a letter about your results with an explanation, but please check with MyChart first.  Please remember to sign up for MyChart if you have not done so, as this will be important to you in the future with finding out test results, communicating by private email, and scheduling acute appointments online when needed.

## 2016-06-19 NOTE — Progress Notes (Signed)
Pre visit review using our clinic review tool, if applicable. No additional management support is needed unless otherwise documented below in the visit note. 

## 2016-06-22 NOTE — Assessment & Plan Note (Signed)
Unusual with strep throat, also for cxr, tessaon perle

## 2016-06-22 NOTE — Assessment & Plan Note (Signed)
stable overall by history and exam, recent data reviewed with pt, and pt to continue medical treatment as before,  to f/u any worsening symptoms or concerns BP Readings from Last 3 Encounters:  06/19/16 130/70  05/27/16 138/82  01/17/16 (!) 160/102

## 2016-06-22 NOTE — Assessment & Plan Note (Signed)
Mild to mod, rapid strep +, for antibx course,  to f/u any worsening symptoms or concerns

## 2016-06-22 NOTE — Assessment & Plan Note (Signed)
stable overall by history and exam, recent data reviewed with pt, and pt to continue medical treatment as before,  to f/u any worsening symptoms or concerns Lab Results  Component Value Date   HGBA1C 8.2 (H) 05/27/2016

## 2016-07-03 ENCOUNTER — Institutional Professional Consult (permissible substitution): Payer: PPO | Admitting: Pulmonary Disease

## 2016-07-29 ENCOUNTER — Ambulatory Visit (INDEPENDENT_AMBULATORY_CARE_PROVIDER_SITE_OTHER): Payer: PPO

## 2016-07-29 DIAGNOSIS — Z23 Encounter for immunization: Secondary | ICD-10-CM

## 2016-07-29 DIAGNOSIS — M1712 Unilateral primary osteoarthritis, left knee: Secondary | ICD-10-CM | POA: Diagnosis not present

## 2016-07-29 DIAGNOSIS — G8929 Other chronic pain: Secondary | ICD-10-CM | POA: Diagnosis not present

## 2016-08-07 ENCOUNTER — Telehealth: Payer: Self-pay | Admitting: Internal Medicine

## 2016-08-07 NOTE — Telephone Encounter (Signed)
Dr Ronnie Derby is doing the procedure. Number is [phone]  Called office and office is closed.

## 2016-08-07 NOTE — Telephone Encounter (Signed)
Pt called in and wanted to know about the paper that she left a couple of weeks ago about her surgical clearance.  Do you know the status of this?

## 2016-08-08 NOTE — Telephone Encounter (Signed)
lvmm for office to fax over surgical clearance.

## 2016-08-12 NOTE — Telephone Encounter (Signed)
Received form. Gave to PCP.

## 2016-08-18 NOTE — Telephone Encounter (Signed)
Form has been faxed back.

## 2016-08-19 NOTE — Telephone Encounter (Signed)
refaxed form

## 2016-08-28 ENCOUNTER — Ambulatory Visit: Payer: PPO | Admitting: Internal Medicine

## 2016-09-15 ENCOUNTER — Other Ambulatory Visit: Payer: Self-pay | Admitting: *Deleted

## 2016-09-15 DIAGNOSIS — IMO0002 Reserved for concepts with insufficient information to code with codable children: Secondary | ICD-10-CM

## 2016-09-15 DIAGNOSIS — E1165 Type 2 diabetes mellitus with hyperglycemia: Secondary | ICD-10-CM

## 2016-09-15 DIAGNOSIS — E1149 Type 2 diabetes mellitus with other diabetic neurological complication: Secondary | ICD-10-CM

## 2016-09-15 MED ORDER — METFORMIN HCL 1000 MG PO TABS: 1000.0000 mg | ORAL_TABLET | Freq: Two times a day (BID) | ORAL | 1 refills | Status: DC

## 2016-09-15 MED ORDER — GLIMEPIRIDE 2 MG PO TABS: 2.0000 mg | ORAL_TABLET | Freq: Every day | ORAL | 1 refills | Status: DC

## 2016-10-01 ENCOUNTER — Ambulatory Visit: Payer: PPO | Admitting: Internal Medicine

## 2016-10-25 ENCOUNTER — Telehealth: Payer: Self-pay | Admitting: Internal Medicine

## 2016-10-25 DIAGNOSIS — E1149 Type 2 diabetes mellitus with other diabetic neurological complication: Secondary | ICD-10-CM

## 2016-10-25 DIAGNOSIS — IMO0002 Reserved for concepts with insufficient information to code with codable children: Secondary | ICD-10-CM

## 2016-10-25 DIAGNOSIS — E1165 Type 2 diabetes mellitus with hyperglycemia: Secondary | ICD-10-CM

## 2016-10-25 MED ORDER — METFORMIN HCL 1000 MG PO TABS: 1000.0000 mg | ORAL_TABLET | Freq: Two times a day (BID) | ORAL | 0 refills | Status: DC

## 2016-10-25 MED ORDER — GLIMEPIRIDE 2 MG PO TABS: 2.0000 mg | ORAL_TABLET | Freq: Every day | ORAL | 0 refills | Status: DC

## 2016-10-25 NOTE — Telephone Encounter (Signed)
Pt aware. Pt will keep upcoming appointment.

## 2016-10-25 NOTE — Telephone Encounter (Signed)
Please refill one month of each medicine  F/u with PCP Wednesday as planned to discuss diabetes

## 2016-10-25 NOTE — Telephone Encounter (Signed)
Rx sent to pharmacy   

## 2016-10-25 NOTE — Telephone Encounter (Signed)
glimepiride (AMARYL) 2 MG tablet  metFORMIN (GLUCOPHAGE) 1000 MG tablet  Sent to:   [place], [place] [address] [phone] (Phone) [phone] (Fax)   She has an appointment Wednesday and has 2 pills left of the metformin. She didn't want to wait that long to take her medication. She also needs to talk to the Dr. About her blood sugar not staying level.

## 2016-10-25 NOTE — Telephone Encounter (Signed)
Pls advise.  

## 2016-10-29 ENCOUNTER — Ambulatory Visit: Payer: PPO | Admitting: Internal Medicine

## 2016-10-29 ENCOUNTER — Emergency Department (HOSPITAL_COMMUNITY)
Admission: EM | Admit: 2016-10-29 | Discharge: 2016-10-29 | Disposition: A | Discharge status: Home / Self Care | Payer: PPO | Attending: Emergency Medicine | Admitting: Emergency Medicine

## 2016-10-29 ENCOUNTER — Encounter (HOSPITAL_COMMUNITY): Payer: Self-pay | Admitting: Emergency Medicine

## 2016-10-29 ENCOUNTER — Emergency Department (HOSPITAL_COMMUNITY): Payer: PPO

## 2016-10-29 DIAGNOSIS — Z96651 Presence of right artificial knee joint: Secondary | ICD-10-CM | POA: Diagnosis not present

## 2016-10-29 DIAGNOSIS — S42491A Other displaced fracture of lower end of right humerus, initial encounter for closed fracture: Secondary | ICD-10-CM | POA: Diagnosis not present

## 2016-10-29 DIAGNOSIS — M25562 Pain in left knee: Secondary | ICD-10-CM | POA: Diagnosis not present

## 2016-10-29 DIAGNOSIS — S42411A Displaced simple supracondylar fracture without intercondylar fracture of right humerus, initial encounter for closed fracture: Secondary | ICD-10-CM

## 2016-10-29 DIAGNOSIS — W010XXA Fall on same level from slipping, tripping and stumbling without subsequent striking against object, initial encounter: Secondary | ICD-10-CM | POA: Diagnosis not present

## 2016-10-29 DIAGNOSIS — Y939 Activity, unspecified: Secondary | ICD-10-CM | POA: Diagnosis not present

## 2016-10-29 DIAGNOSIS — E119 Type 2 diabetes mellitus without complications: Secondary | ICD-10-CM | POA: Insufficient documentation

## 2016-10-29 DIAGNOSIS — Y92009 Unspecified place in unspecified non-institutional (private) residence as the place of occurrence of the external cause: Secondary | ICD-10-CM | POA: Insufficient documentation

## 2016-10-29 DIAGNOSIS — J449 Chronic obstructive pulmonary disease, unspecified: Secondary | ICD-10-CM | POA: Insufficient documentation

## 2016-10-29 DIAGNOSIS — I10 Essential (primary) hypertension: Secondary | ICD-10-CM | POA: Diagnosis not present

## 2016-10-29 DIAGNOSIS — S42431A Displaced fracture (avulsion) of lateral epicondyle of right humerus, initial encounter for closed fracture: Secondary | ICD-10-CM | POA: Insufficient documentation

## 2016-10-29 DIAGNOSIS — Y999 Unspecified external cause status: Secondary | ICD-10-CM | POA: Insufficient documentation

## 2016-10-29 DIAGNOSIS — W19XXXA Unspecified fall, initial encounter: Secondary | ICD-10-CM

## 2016-10-29 DIAGNOSIS — Z79899 Other long term (current) drug therapy: Secondary | ICD-10-CM | POA: Diagnosis not present

## 2016-10-29 DIAGNOSIS — R930 Abnormal findings on diagnostic imaging of skull and head, not elsewhere classified: Secondary | ICD-10-CM | POA: Diagnosis not present

## 2016-10-29 DIAGNOSIS — S0083XA Contusion of other part of head, initial encounter: Secondary | ICD-10-CM | POA: Diagnosis not present

## 2016-10-29 DIAGNOSIS — S8992XA Unspecified injury of left lower leg, initial encounter: Secondary | ICD-10-CM | POA: Diagnosis not present

## 2016-10-29 DIAGNOSIS — S4991XA Unspecified injury of right shoulder and upper arm, initial encounter: Secondary | ICD-10-CM | POA: Diagnosis not present

## 2016-10-29 DIAGNOSIS — S42441A Displaced fracture (avulsion) of medial epicondyle of right humerus, initial encounter for closed fracture: Secondary | ICD-10-CM | POA: Insufficient documentation

## 2016-10-29 DIAGNOSIS — S0003XA Contusion of scalp, initial encounter: Secondary | ICD-10-CM | POA: Diagnosis not present

## 2016-10-29 DIAGNOSIS — S8001XA Contusion of right knee, initial encounter: Secondary | ICD-10-CM | POA: Insufficient documentation

## 2016-10-29 DIAGNOSIS — Z87891 Personal history of nicotine dependence: Secondary | ICD-10-CM | POA: Diagnosis not present

## 2016-10-29 MED ORDER — ONDANSETRON 8 MG PO TBDP
8.0000 mg | ORAL_TABLET | Freq: Once | ORAL | Status: AC
  Administered 2016-10-29: 8 mg via ORAL
  Filled 2016-10-29: qty 1

## 2016-10-29 MED ORDER — HYDROCODONE-ACETAMINOPHEN 5-325 MG PO TABS: 1.0000 | ORAL_TABLET | Freq: Four times a day (QID) | ORAL | 0 refills | Status: DC | PRN

## 2016-10-29 MED ORDER — HYDROMORPHONE HCL 2 MG/ML IJ SOLN
2.0000 mg | Freq: Once | INTRAMUSCULAR | Status: AC
  Administered 2016-10-29: 2 mg via INTRAMUSCULAR
  Filled 2016-10-29: qty 1

## 2016-10-29 NOTE — ED Provider Notes (Signed)
[facility] DEPT Provider Note   CSN: [id number] Arrival date & time: 10/29/16  0432     History   Chief Complaint Chief Complaint  Patient presents with  . Fall    HPI Abigail Collins is a 66 y.o. female.  Patient is a 66 year old female with past medical history of COPD, diabetes, and hypertension. She presents for evaluation of a fall. She reports tripping over her husband shoes, following forward, and injuring her right elbow, left knee, and striking her forehead on the ground. She reports a brief loss of consciousness. She reports some discomfort in her head and left knee, however severe pain in her right elbow.   The history is provided by the patient.  Fall  This is a new problem. The current episode started 1 to 2 hours ago. The problem occurs constantly. The problem has not changed since onset.Associated symptoms include headaches. Pertinent negatives include no chest pain, no abdominal pain and no shortness of breath. Exacerbated by: Movement and palpation. Nothing relieves the symptoms. She has tried nothing for the symptoms.    Past Medical History:  Diagnosis Date  . Anemia    hx of  . Anginal pain ([place])    "left arm pain, sees Dr. Etter Sjogren, had card cath 2013"  . Arthritis    "all over" (09/14/2012)  . Asthma    takes inhaler 2x day  . Bronchitis    hx of  . Bulging disc    "lower"  . Carpal tunnel syndrome of left wrist   . Depression    denies  . Exertional dyspnea    "sometimes laying down" (09/14/2012)  . Hyperlipidemia   . Hypertension    sees Dr. Debby Freiberg, primary  . Pneumonia 03/2012  . PONV (postoperative nausea and vomiting)   . Thyroid disease 1960's   "don't have it now" (09/14/2012)  . Type II diabetes mellitus ([place])   . Urinary tract infection    hx of  . Vomiting    pt states she vomits every am    Patient Active Problem List   Diagnosis Date Noted  . Acute pharyngitis 06/19/2016  . Excessive daytime sleepiness 05/27/2016    . COPD mixed type ([place]) 01/09/2016  . Visit for screening mammogram 03/28/2015  . Colon cancer screening 03/28/2015  . Depression with somatization 03/28/2015  . Insomnia 03/28/2015  . Primary osteoarthritis of both knees 03/28/2015  . Allergic rhinitis, cause unspecified 03/15/2014  . Cough 02/13/2014  . Hyperlipidemia with target LDL less than 100 12/22/2013  . Routine general medical examination at a health care facility 08/03/2013  . Lumbar compression fracture ([place]) 11/01/2012  . Pure hyperglyceridemia 08/05/2012  . Gastroparesis due to DM ([place]) 08/05/2012  . Other screening mammogram 08/05/2012  . Diabetes mellitus with neurological manifestations, uncontrolled ([place]) 04/05/2012  . HTN (hypertension), benign 04/05/2012  . GERD (gastroesophageal reflux disease) 04/05/2012  . Morbid obesity ([place]) 04/05/2012  . Left knee DJD 06/19/2011    Past Surgical History:  Procedure Laterality Date  . CARDIAC CATHETERIZATION  05/13/2012   mod luminal irregularity of pLAD, no sign CAD, EF 65%.  . CHOLECYSTECTOMY  1970's  . KYPHOPLASTY N/A 02/10/2013   Procedure:  LUMBAR TWO KYPHOPLASTY;  Surgeon: Ophelia Charter, MD;  Location: [facility];  Service: Neurosurgery;  Laterality: N/A;  L2 Kyphoplasty; Will use Stern's Carm.  Marland Kitchen TOTAL ELBOW REPLACEMENT  ~ 2005   "right" (09/14/2012)  . TOTAL KNEE ARTHROPLASTY  09/13/2012   Procedure: TOTAL KNEE  ARTHROPLASTY;  Surgeon: Vickey Huger, MD;  Location: [facility];  Service: Orthopedics;  Laterality: Right;  . TUBAL LIGATION  1970's  . VAGINAL HYSTERECTOMY  1970's    OB History    No data available       Home Medications    Prior to Admission medications   Medication Sig Start Date End Date Taking? Authorizing Provider  albuterol (PROVENTIL) (2.5 MG/3ML) 0.083% nebulizer solution Inhale 2.5 mg into the lungs every 6 (six) hours as needed for wheezing or shortness of breath. Reported on 01/17/2016    Historical Provider, MD  benzonatate (TESSALON) 100  MG capsule 1-2 tab by mouth every 6 hrs as needed 06/19/16   Biagio Borg, MD  Blood Glucose Monitoring Suppl (ONETOUCH VERIO IQ SYSTEM) W/DEVICE KIT 1 Act by Does not apply route 3 (three) times daily. 09/03/15   Janith Lima, MD  budesonide-formoterol (SYMBICORT) 80-4.5 MCG/ACT inhaler Inhale 2 puffs into the lungs 2 (two) times daily. 01/09/16   Janith Lima, MD  diclofenac (CATAFLAM) 50 MG tablet Take 50 mg by mouth. 05/06/16   Historical Provider, MD  glimepiride (AMARYL) 2 MG tablet Take 1 tablet (2 mg total) by mouth daily before breakfast. Due for follow-up appt w/labs must see MD for future refills 10/25/16   Abner Greenspan, MD  glucose blood (ONETOUCH VERIO) test strip Use TID 01/09/16   Janith Lima, MD  Insulin Pen Needle (NOVOFINE) 32G X 6 MM MISC 1 Act by Does not apply route daily. 05/27/16   Janith Lima, MD  metFORMIN (GLUCOPHAGE) 1000 MG tablet Take 1 tablet (1,000 mg total) by mouth 2 (two) times daily with a meal. Due for follow-up appt w/labs must see MD for future refills 10/25/16   Abner Greenspan, MD  telmisartan-hydrochlorothiazide (MICARDIS HCT) 80-12.5 MG tablet Take 1 tablet by mouth daily. 01/17/16   Janith Lima, MD  TOUJEO SOLOSTAR 300 UNIT/ML SOPN Inject 50 Units into the skin at bedtime. 01/09/16   Janith Lima, MD    Family History Family History  Problem Relation Age of Onset  . Arthritis Mother   . Arthritis Father   . Hypertension Father   . Diabetes Father   . Colon cancer Neg Hx     Social History Social History  Substance Use Topics  . Smoking status: Former Smoker    Packs/day: 0.25    Years: 20.00    Types: Cigarettes    Quit date: 10/13/2009  . Smokeless tobacco: Never Used  . Alcohol use No     Comment: 09/14/2012 "did drink a little in my younger days"     Allergies   Lisinopril; Amlodipine; Tape; and Percocet [oxycodone-acetaminophen]   Review of Systems Review of Systems  Respiratory: Negative for shortness of breath.     Cardiovascular: Negative for chest pain.  Gastrointestinal: Negative for abdominal pain.  Neurological: Positive for headaches.  All other systems reviewed and are negative.    Physical Exam Updated Vital Signs BP 135/78 (BP Location: Left Arm)   Pulse 102   Temp 97.6 F (36.4 C) (Oral)   Resp 20   SpO2 97%   Physical Exam  Constitutional: She is oriented to person, place, and time. She appears well-developed and well-nourished. No distress.  HENT:  Head: Normocephalic and atraumatic.  Eyes: EOM are normal. Pupils are equal, round, and reactive to light.  There is some swelling and ecchymosis to the left forehead above the left eye.  Neck: Normal  range of motion. Neck supple.  Cardiovascular: Normal rate and regular rhythm.  Exam reveals no gallop and no friction rub.   No murmur heard. Pulmonary/Chest: Effort normal and breath sounds normal. No respiratory distress. She has no wheezes.  Abdominal: Soft. Bowel sounds are normal. She exhibits no distension. There is no tenderness.  Musculoskeletal: Normal range of motion.  There is tenderness of the right elbow, however no obvious deformity. Ulnar and radial pulses are palpable. She is able to flex, extend, and oppose all fingers. Sensation is intact throughout the entire hand.  Left knee appears grossly normal. There is no obvious effusion or deformity. There is an abrasion to the anterior patella, but no crepitus or instability.  Neurological: She is alert and oriented to person, place, and time.  Skin: Skin is warm and dry. She is not diaphoretic.  Nursing note and vitals reviewed.    ED Treatments / Results  Labs (all labs ordered are listed, but only abnormal results are displayed) Labs Reviewed - No data to display  EKG  EKG Interpretation None       Radiology Dg Elbow Complete Right  Result Date: 10/29/2016 CLINICAL DATA:  Fall at home landing on right elbow. Right elbow pain. EXAM: RIGHT ELBOW - COMPLETE  3+ VIEW COMPARISON:  Preoperative radiographs 11/14/2004 FINDINGS: Comminuted fracture of the distal humerus. Fracture extends to the articular surface with 2 mm distraction. Minimally displaced component involves the medial and lateral humeral epicondyles as well as lateral metaphyseal cortex. There is an associated joint effusion. Radial head prosthesis in place without periprosthetic fracture. IMPRESSION: Comminuted mildly displaced intra-articular distal humerus fracture. Fracture involves the medial and lateral epicondyles as well as is lateral metaphyseal cortex. Radial head prosthesis in place without periprosthetic fracture. Electronically Signed   By: Jeb Levering M.D.   On: 10/29/2016 05:21   Dg Knee Complete 4 Views Left  Result Date: 10/29/2016 CLINICAL DATA:  Fall at home with left knee pain. EXAM: LEFT KNEE - COMPLETE 4+ VIEW COMPARISON:  None. FINDINGS: No acute fracture or dislocation. Tricompartmental osteoarthritis with peripheral spurring. Mild lateral tibiofemoral joint space narrowing. Possible small knee joint effusion. IMPRESSION: Tricompartmental osteoarthritis without acute fracture or subluxation. Possible small knee joint effusion. Electronically Signed   By: Jeb Levering M.D.   On: 10/29/2016 06:33    Procedures Procedures (including critical care time)  Medications Ordered in ED Medications  HYDROmorphone (DILAUDID) injection 2 mg (2 mg Intramuscular Given 10/29/16 1324)     Initial Impression / Assessment and Plan / ED Course  I have reviewed the triage vital signs and the nursing notes.  Pertinent labs & imaging results that were available during my care of the patient were reviewed by me and considered in my medical decision making (see chart for details).  Clinical Course     Imaging studies reveal a distal humerus fracture. The prior surgical prosthesis is intact and there is no periprosthetic fracture. I've discussed this finding with Dr. Ninfa Linden  from orthopedics who is recommending a long-arm splint and follow-up in the office in the upcoming days.  Final Clinical Impressions(s) / ED Diagnoses   Final diagnoses:  None    New Prescriptions New Prescriptions   No medications on file     Veryl Speak, MD 10/29/16 [phone]

## 2016-10-29 NOTE — ED Triage Notes (Addendum)
Pt here for evaluation after a fall; pt tripped over a shoe and fell forward, injuring her right arm; pt has bruise noted left cheek; rug burn noted on left knee; pt walks with a cane; states she felt out of sorts immediately after fall but is unsure if she lost consciousness; A&Ox4 at present; pt has artifical joint in right elbow

## 2016-10-29 NOTE — Discharge Planning (Signed)
Pt up for discharge. [facility] reviewed chart for possible CM needs.  No needs identified or communicated.  Imaging studies reveal a distal humerus fracture. The prior surgical prosthesis is intact and there is no periprosthetic fracture. EDP discussed this finding with Dr. Ninfa Linden from orthopedics who is recommending a long-arm splint and follow-up in the office in the upcoming days. [name], [place], [place], [place]

## 2016-10-29 NOTE — Discharge Instructions (Signed)
Percocet as prescribed as needed for pain.  Ice your elbow for 20 minutes every 2 hours while awake for the next 2 days.  You are to follow-up in the orthopedic clinic with Dr. Ninfa Linden in the next 3-4 days. His contact information has been provided in this discharge summary for you to call and make these arrangements. He is aware of your situation.  Wear arm splint and sling as applied until followed up by orthopedics.

## 2016-10-31 ENCOUNTER — Ambulatory Visit (INDEPENDENT_AMBULATORY_CARE_PROVIDER_SITE_OTHER): Payer: PPO | Admitting: Orthopedic Surgery

## 2016-10-31 ENCOUNTER — Encounter (INDEPENDENT_AMBULATORY_CARE_PROVIDER_SITE_OTHER): Payer: Self-pay | Admitting: Orthopedic Surgery

## 2016-10-31 DIAGNOSIS — S42492A Other displaced fracture of lower end of left humerus, initial encounter for closed fracture: Secondary | ICD-10-CM | POA: Diagnosis not present

## 2016-10-31 DIAGNOSIS — S8002XA Contusion of left knee, initial encounter: Secondary | ICD-10-CM

## 2016-10-31 NOTE — Progress Notes (Signed)
Office Visit Note   Patient: Abigail Collins           Date of Birth: 01/08/1951           MRN: [id number] Visit Date: 10/31/2016 Requested by: Abigail Lima, MD [address] [place], [place] [address] PCP: Abigail Calico, MD  Subjective: Chief Complaint  Patient presents with  . Right Elbow - Fracture, Injury    HPI Abigail Collins is a 66 year old retired and disabled patient who is right-hand-dominant who sustained a fall 10/29/2016.  She tripped on per shoes.  Radiographs demonstrate fracture of the distal humerus and she also had radial head replacement 11 years ago.  She is an insulin-dependent diabetic.  She is an ex-smoker greater than 12 years ago.  Prior to the injury she describes what sounds like a 25 lack of extension and about a 20 lack of flexion.  She's been taking Norco for pain.  She also describes hitting her left knee which has arthritis and which may require knee replacement in the future.  She's had right total knee replacement.              Review of Systems All systems reviewed are negative as they relate to the chief complaint within the history of present illness.  Patient denies  fevers or chills.    Assessment & Plan: Visit Diagnoses:  1. Closed bicondylar fracture of distal humerus, left, initial encounter   2. Contusion of left knee, initial encounter     Plan: Impression is left knee contusion which can be watched.  In regards to that right elbow she is currently is in a functional splint which is allowing for swelling but not allowing for motion.  Motor sensory function to the hand is intact.  I would favor a course of nonoperative treatment for this fracture as it is nondisplaced at this time on both AP lateral and oblique radial grafts.  We need to repeat radiographs next Wednesday.  If displacement has occurred and fixation will be required.  However at this time I would favor observation.  Repeat radiographs out of the splint AP lateral and  oblique with likely long-arm casting at that time.  Follow-Up Instructions: Return in about 5 days (around 11/05/2016).   Orders:  No orders of the defined types were placed in this encounter.  No orders of the defined types were placed in this encounter.     Procedures: No procedures performed   Clinical Data: No additional findings.  Objective: Vital Signs: There were no vitals taken for this visit.  Physical Exam   Constitutional: Patient appears well-developed HEENT:  Head: Normocephalic Eyes:EOM are normal Neck: Normal range of motion Cardiovascular: Normal rate Pulmonary/chest: Effort normal Neurologic: Patient is alert Skin: Skin is warm Psychiatric: Patient has normal mood and affect    Ortho Exam examination the left knee demonstrates a small abrasion anterolaterally no effusion intact extensor mechanism good range of motion stable collateral crucial ligaments.  Examination the right arm demonstrates some swelling in the fingers but her EPL FPL interosseous is intact.  Fingers themselves are perfused and sensate.  No real coarseness grinding or crepitus with shoulder range of motion.  Specialty Comments:  No specialty comments available.  Imaging: No results found.   PMFS History: Patient Active Problem List   Diagnosis Date Noted  . Closed bicondylar fracture of distal humerus, left, initial encounter 10/31/2016  . Acute pharyngitis 06/19/2016  . Excessive daytime sleepiness 05/27/2016  .  COPD mixed type (Abigail Collins) 01/09/2016  . Visit for screening mammogram 03/28/2015  . Colon cancer screening 03/28/2015  . Depression with somatization 03/28/2015  . Insomnia 03/28/2015  . Primary osteoarthritis of both knees 03/28/2015  . Allergic rhinitis, cause unspecified 03/15/2014  . Cough 02/13/2014  . Hyperlipidemia with target LDL less than 100 12/22/2013  . Routine general medical examination at a health care facility 08/03/2013  . Lumbar compression  fracture ([place]) 11/01/2012  . Pure hyperglyceridemia 08/05/2012  . Gastroparesis due to DM ([place]) 08/05/2012  . Other screening mammogram 08/05/2012  . Diabetes mellitus with neurological manifestations, uncontrolled ([place]) 04/05/2012  . HTN (hypertension), benign 04/05/2012  . GERD (gastroesophageal reflux disease) 04/05/2012  . Morbid obesity ([place]) 04/05/2012  . Left knee DJD 06/19/2011   Past Medical History:  Diagnosis Date  . Anemia    hx of  . Anginal pain ([place])    "left arm pain, sees Dr. Etter Collins, had card cath 2013"  . Arthritis    "all over" (09/14/2012)  . Asthma    takes inhaler 2x day  . Bronchitis    hx of  . Bulging disc    "lower"  . Carpal tunnel syndrome of left wrist   . Depression    denies  . Exertional dyspnea    "sometimes laying down" (09/14/2012)  . Hyperlipidemia   . Hypertension    sees Dr. Debby Collins, primary  . Pneumonia 03/2012  . PONV (postoperative nausea and vomiting)   . Thyroid disease 1960's   "don't have it now" (09/14/2012)  . Type II diabetes mellitus ([place])   . Urinary tract infection    hx of  . Vomiting    pt states she vomits every am    Family History  Problem Relation Age of Onset  . Arthritis Mother   . Arthritis Father   . Hypertension Father   . Diabetes Father   . Colon cancer Neg Hx     Past Surgical History:  Procedure Laterality Date  . CARDIAC CATHETERIZATION  05/13/2012   mod luminal irregularity of pLAD, no sign CAD, EF 65%.  . CHOLECYSTECTOMY  1970's  . KYPHOPLASTY N/A 02/10/2013   Procedure:  LUMBAR TWO KYPHOPLASTY;  Surgeon: Abigail Charter, MD;  Location: [facility];  Service: Neurosurgery;  Laterality: N/A;  L2 Kyphoplasty; Will use Stern's Carm.  Marland Kitchen TOTAL ELBOW REPLACEMENT  ~ 2005   "right" (09/14/2012)  . TOTAL KNEE ARTHROPLASTY  09/13/2012   Procedure: TOTAL KNEE ARTHROPLASTY;  Surgeon: Abigail Huger, MD;  Location: [facility];  Service: Orthopedics;  Laterality: Right;  . TUBAL LIGATION  1970's  . VAGINAL  HYSTERECTOMY  1970's   Social History   Occupational History  . Not on file.   Social History Main Topics  . Smoking status: Former Smoker    Packs/day: 0.25    Years: 20.00    Types: Cigarettes    Quit date: 10/13/2009  . Smokeless tobacco: Never Used  . Alcohol use No     Comment: 09/14/2012 "did drink a little in my younger days"  . Drug use: No  . Sexual activity: Not Currently

## 2016-11-05 ENCOUNTER — Ambulatory Visit (INDEPENDENT_AMBULATORY_CARE_PROVIDER_SITE_OTHER): Payer: PPO | Admitting: Orthopedic Surgery

## 2016-11-05 ENCOUNTER — Encounter (INDEPENDENT_AMBULATORY_CARE_PROVIDER_SITE_OTHER): Payer: Self-pay | Admitting: Orthopedic Surgery

## 2016-11-05 ENCOUNTER — Ambulatory Visit (INDEPENDENT_AMBULATORY_CARE_PROVIDER_SITE_OTHER): Payer: PPO

## 2016-11-05 DIAGNOSIS — S42492A Other displaced fracture of lower end of left humerus, initial encounter for closed fracture: Secondary | ICD-10-CM | POA: Diagnosis not present

## 2016-11-05 MED ORDER — HYDROCODONE-ACETAMINOPHEN 5-325 MG PO TABS: 1.0000 | ORAL_TABLET | Freq: Four times a day (QID) | ORAL | 0 refills | Status: DC | PRN

## 2016-11-05 MED ORDER — FLUCONAZOLE 100 MG PO TABS: 100.0000 mg | ORAL_TABLET | Freq: Every day | ORAL | 0 refills | Status: DC

## 2016-11-05 NOTE — Progress Notes (Signed)
Post-Op Visit Note   Patient: Abigail Collins           Date of Birth: 1951/04/13           MRN: [id number] Visit Date: 11/05/2016 PCP: Scarlette Calico, MD   Assessment & Plan:  Chief Complaint:  Chief Complaint  Patient presents with  . Right Elbow - Follow-up, Fracture   Visit Diagnoses:  1. Closed bicondylar fracture of distal humerus, left, initial encounter     Plan: There is a 66 year old patient with distal bicondylar humeral fracture.  She's been in a long-arm cast.  Fracture has been nondisplaced.  Radiographs today show that it remains nondisplaced.  Plan is for splint removal with long-arm cast application 2 week return repeat radiographs out of the cast with initiation of range of motion at that time.  Follow-Up Instructions: Return in about 2 weeks (around 11/19/2016).   Orders:  Orders Placed This Encounter  Procedures  . XR Elbow 2 Views Right   Meds ordered this encounter  Medications  . HYDROcodone-acetaminophen (NORCO) 5-325 MG tablet    Sig: Take 1 tablet by mouth every 6 (six) hours as needed.    Dispense:  30 tablet    Refill:  0  . fluconazole (DIFLUCAN) 100 MG tablet    Sig: Take 1 tablet (100 mg total) by mouth daily.    Dispense:  2 tablet    Refill:  0    Imaging: Xr Elbow 2 Views Right  Result Date: 11/05/2016 2 views right distal humerus reviewed.  No change in fracture alignment present.  Distal humeral fracture is visualized but appear unchanged and nondisplaced.   PMFS History: Patient Active Problem List   Diagnosis Date Noted  . Closed bicondylar fracture of distal humerus, left, initial encounter 10/31/2016  . Acute pharyngitis 06/19/2016  . Excessive daytime sleepiness 05/27/2016  . COPD mixed type ([place]) 01/09/2016  . Visit for screening mammogram 03/28/2015  . Colon cancer screening 03/28/2015  . Depression with somatization 03/28/2015  . Insomnia 03/28/2015  . Primary osteoarthritis of both knees 03/28/2015  . Allergic  rhinitis, cause unspecified 03/15/2014  . Cough 02/13/2014  . Hyperlipidemia with target LDL less than 100 12/22/2013  . Routine general medical examination at a health care facility 08/03/2013  . Lumbar compression fracture ([place]) 11/01/2012  . Pure hyperglyceridemia 08/05/2012  . Gastroparesis due to DM ([place]) 08/05/2012  . Other screening mammogram 08/05/2012  . Diabetes mellitus with neurological manifestations, uncontrolled ([place]) 04/05/2012  . HTN (hypertension), benign 04/05/2012  . GERD (gastroesophageal reflux disease) 04/05/2012  . Morbid obesity ([place]) 04/05/2012  . Left knee DJD 06/19/2011   Past Medical History:  Diagnosis Date  . Anemia    hx of  . Anginal pain ([place])    "left arm pain, sees Dr. Etter Sjogren, had card cath 2013"  . Arthritis    "all over" (09/14/2012)  . Asthma    takes inhaler 2x day  . Bronchitis    hx of  . Bulging disc    "lower"  . Carpal tunnel syndrome of left wrist   . Depression    denies  . Exertional dyspnea    "sometimes laying down" (09/14/2012)  . Hyperlipidemia   . Hypertension    sees Dr. Debby Freiberg, primary  . Pneumonia 03/2012  . PONV (postoperative nausea and vomiting)   . Thyroid disease 1960's   "don't have it now" (09/14/2012)  . Type II diabetes mellitus ([place])   . Urinary tract infection  hx of  . Vomiting    pt states she vomits every am    Family History  Problem Relation Age of Onset  . Arthritis Mother   . Arthritis Father   . Hypertension Father   . Diabetes Father   . Colon cancer Neg Hx     Past Surgical History:  Procedure Laterality Date  . CARDIAC CATHETERIZATION  05/13/2012   mod luminal irregularity of pLAD, no sign CAD, EF 65%.  . CHOLECYSTECTOMY  1970's  . KYPHOPLASTY N/A 02/10/2013   Procedure:  LUMBAR TWO KYPHOPLASTY;  Surgeon: Ophelia Charter, MD;  Location: [facility];  Service: Neurosurgery;  Laterality: N/A;  L2 Kyphoplasty; Will use Stern's Carm.  Marland Kitchen TOTAL ELBOW REPLACEMENT  ~ 2005   "right"  (09/14/2012)  . TOTAL KNEE ARTHROPLASTY  09/13/2012   Procedure: TOTAL KNEE ARTHROPLASTY;  Surgeon: Vickey Huger, MD;  Location: [facility];  Service: Orthopedics;  Laterality: Right;  . TUBAL LIGATION  1970's  . VAGINAL HYSTERECTOMY  1970's   Social History   Occupational History  . Not on file.   Social History Main Topics  . Smoking status: Former Smoker    Packs/day: 0.25    Years: 20.00    Types: Cigarettes    Quit date: 10/13/2009  . Smokeless tobacco: Never Used  . Alcohol use No     Comment: 09/14/2012 "did drink a little in my younger days"  . Drug use: No  . Sexual activity: Not Currently

## 2016-11-07 ENCOUNTER — Telehealth: Payer: Self-pay | Admitting: Internal Medicine

## 2016-11-07 NOTE — Telephone Encounter (Signed)
Spoke with patient regarding awv. Patient stated that she will give office a call back once she is feeling better.

## 2016-11-20 ENCOUNTER — Ambulatory Visit (INDEPENDENT_AMBULATORY_CARE_PROVIDER_SITE_OTHER): Payer: PPO

## 2016-11-20 ENCOUNTER — Ambulatory Visit (INDEPENDENT_AMBULATORY_CARE_PROVIDER_SITE_OTHER): Payer: PPO | Admitting: Orthopedic Surgery

## 2016-11-20 ENCOUNTER — Encounter (INDEPENDENT_AMBULATORY_CARE_PROVIDER_SITE_OTHER): Payer: Self-pay | Admitting: Orthopedic Surgery

## 2016-11-20 DIAGNOSIS — S42492A Other displaced fracture of lower end of left humerus, initial encounter for closed fracture: Secondary | ICD-10-CM | POA: Diagnosis not present

## 2016-11-20 NOTE — Progress Notes (Deleted)
Office Visit Note   Patient: Abigail Collins           Date of Birth: 1951-02-03           MRN: [id number] Visit Date: 11/20/2016 Requested by: Janith Lima, MD [address] [place], [place] [address] PCP: Scarlette Calico, MD  Subjective: Chief Complaint  Patient presents with  . Right Elbow - Fracture, Follow-up    HPI              Review of Systems   Assessment & Plan: Visit Diagnoses:  1. Closed bicondylar fracture of distal humerus, left, initial encounter     Plan: ***  Follow-Up Instructions: Return in about 3 weeks (around 12/11/2016).   Orders:  Orders Placed This Encounter  Procedures  . XR Elbow 2 Views Right   No orders of the defined types were placed in this encounter.     Procedures: No procedures performed   Clinical Data: No additional findings.  Objective: Vital Signs: There were no vitals taken for this visit.  Physical Exam  Ortho Exam  Specialty Comments:  No specialty comments available.  Imaging: Xr Elbow 2 Views Right  Result Date: 11/20/2016 2 views right elbow reviewed.  No change in fracture alignment is noted.  Patient has minimally displaced distal humerus fracture with pre-existing arthritis present.  Radial head prosthesis in place.    PMFS History: Patient Active Problem List   Diagnosis Date Noted  . Closed bicondylar fracture of distal humerus, left, initial encounter 10/31/2016  . Acute pharyngitis 06/19/2016  . Excessive daytime sleepiness 05/27/2016  . COPD mixed type ([place]) 01/09/2016  . Visit for screening mammogram 03/28/2015  . Colon cancer screening 03/28/2015  . Depression with somatization 03/28/2015  . Insomnia 03/28/2015  . Primary osteoarthritis of both knees 03/28/2015  . Allergic rhinitis, cause unspecified 03/15/2014  . Cough 02/13/2014  . Hyperlipidemia with target LDL less than 100 12/22/2013  . Routine general medical examination at a health care facility 08/03/2013  . Lumbar  compression fracture ([place]) 11/01/2012  . Pure hyperglyceridemia 08/05/2012  . Gastroparesis due to DM ([place]) 08/05/2012  . Other screening mammogram 08/05/2012  . Diabetes mellitus with neurological manifestations, uncontrolled ([place]) 04/05/2012  . HTN (hypertension), benign 04/05/2012  . GERD (gastroesophageal reflux disease) 04/05/2012  . Morbid obesity ([place]) 04/05/2012  . Left knee DJD 06/19/2011   Past Medical History:  Diagnosis Date  . Anemia    hx of  . Anginal pain ([place])    "left arm pain, sees Dr. Etter Sjogren, had card cath 2013"  . Arthritis    "all over" (09/14/2012)  . Asthma    takes inhaler 2x day  . Bronchitis    hx of  . Bulging disc    "lower"  . Carpal tunnel syndrome of left wrist   . Depression    denies  . Exertional dyspnea    "sometimes laying down" (09/14/2012)  . Hyperlipidemia   . Hypertension    sees Dr. Debby Freiberg, primary  . Pneumonia 03/2012  . PONV (postoperative nausea and vomiting)   . Thyroid disease 1960's   "don't have it now" (09/14/2012)  . Type II diabetes mellitus ([place])   . Urinary tract infection    hx of  . Vomiting    pt states she vomits every am    Family History  Problem Relation Age of Onset  . Arthritis Mother   . Arthritis Father   . Hypertension Father   .  Diabetes Father   . Colon cancer Neg Hx     Past Surgical History:  Procedure Laterality Date  . CARDIAC CATHETERIZATION  05/13/2012   mod luminal irregularity of pLAD, no sign CAD, EF 65%.  . CHOLECYSTECTOMY  1970's  . KYPHOPLASTY N/A 02/10/2013   Procedure:  LUMBAR TWO KYPHOPLASTY;  Surgeon: Ophelia Charter, MD;  Location: [facility];  Service: Neurosurgery;  Laterality: N/A;  L2 Kyphoplasty; Will use Stern's Carm.  Marland Kitchen TOTAL ELBOW REPLACEMENT  ~ 2005   "right" (09/14/2012)  . TOTAL KNEE ARTHROPLASTY  09/13/2012   Procedure: TOTAL KNEE ARTHROPLASTY;  Surgeon: Vickey Huger, MD;  Location: [facility];  Service: Orthopedics;  Laterality: Right;  . TUBAL LIGATION  1970's  .  VAGINAL HYSTERECTOMY  1970's   Social History   Occupational History  . Not on file.   Social History Main Topics  . Smoking status: Former Smoker    Packs/day: 0.25    Years: 20.00    Types: Cigarettes    Quit date: 10/13/2009  . Smokeless tobacco: Never Used  . Alcohol use No     Comment: 09/14/2012 "did drink a little in my younger days"  . Drug use: No  . Sexual activity: Not Currently

## 2016-11-21 NOTE — Progress Notes (Signed)
Office Visit Note   Patient: Abigail Collins           Date of Birth: 03-26-1951           MRN: [id number] Visit Date: 11/20/2016 Requested by: Janith Lima, MD [address] [place], [place] [address] PCP: Scarlette Calico, MD  Subjective: Chief Complaint  Patient presents with  . Right Elbow - Fracture, Follow-up    HPI              Review of Systems   Assessment & Plan: Visit Diagnoses:  1. Closed bicondylar fracture of distal humerus, left, initial encounter     Plan: The Kiann 66 year old patient with a proximal humerus fracture on the right-hand side.  Splint is removed.  She is now about 3 weeks out from the injury.  She has pretty a pronation supination.  She had a lot of arthritis in the elbow pre-injury.  Plan at this time is to have her start doing some range of motion exercises out of the splint but staying in the sling.  Check her back in 3 weeks.  Radiographs show not much in change of alignment of the fractures.  She'll need radiographs on return  Follow-Up Instructions: Return in about 3 weeks (around 12/11/2016).   Orders:  Orders Placed This Encounter  Procedures  . XR Elbow 2 Views Right   No orders of the defined types were placed in this encounter.     Procedures: No procedures performed   Clinical Data: No additional findings.  Objective: Vital Signs: There were no vitals taken for this visit.  Physical Exam  Ortho Exam  Specialty Comments:  No specialty comments available.  Imaging: Xr Elbow 2 Views Right  Result Date: 11/20/2016 2 views right elbow reviewed.  No change in fracture alignment is noted.  Patient has minimally displaced distal humerus fracture with pre-existing arthritis present.  Radial head prosthesis in place.    PMFS History: Patient Active Problem List   Diagnosis Date Noted  . Closed bicondylar fracture of distal humerus, left, initial encounter 10/31/2016  . Acute pharyngitis 06/19/2016  .  Excessive daytime sleepiness 05/27/2016  . COPD mixed type ([place]) 01/09/2016  . Visit for screening mammogram 03/28/2015  . Colon cancer screening 03/28/2015  . Depression with somatization 03/28/2015  . Insomnia 03/28/2015  . Primary osteoarthritis of both knees 03/28/2015  . Allergic rhinitis, cause unspecified 03/15/2014  . Cough 02/13/2014  . Hyperlipidemia with target LDL less than 100 12/22/2013  . Routine general medical examination at a health care facility 08/03/2013  . Lumbar compression fracture ([place]) 11/01/2012  . Pure hyperglyceridemia 08/05/2012  . Gastroparesis due to DM ([place]) 08/05/2012  . Other screening mammogram 08/05/2012  . Diabetes mellitus with neurological manifestations, uncontrolled ([place]) 04/05/2012  . HTN (hypertension), benign 04/05/2012  . GERD (gastroesophageal reflux disease) 04/05/2012  . Morbid obesity ([place]) 04/05/2012  . Left knee DJD 06/19/2011   Past Medical History:  Diagnosis Date  . Anemia    hx of  . Anginal pain ([place])    "left arm pain, sees Dr. Etter Sjogren, had card cath 2013"  . Arthritis    "all over" (09/14/2012)  . Asthma    takes inhaler 2x day  . Bronchitis    hx of  . Bulging disc    "lower"  . Carpal tunnel syndrome of left wrist   . Depression    denies  . Exertional dyspnea    "sometimes laying down" (  09/14/2012)  . Hyperlipidemia   . Hypertension    sees Dr. Debby Freiberg, primary  . Pneumonia 03/2012  . PONV (postoperative nausea and vomiting)   . Thyroid disease 1960's   "don't have it now" (09/14/2012)  . Type II diabetes mellitus ([place])   . Urinary tract infection    hx of  . Vomiting    pt states she vomits every am    Family History  Problem Relation Age of Onset  . Arthritis Mother   . Arthritis Father   . Hypertension Father   . Diabetes Father   . Colon cancer Neg Hx     Past Surgical History:  Procedure Laterality Date  . CARDIAC CATHETERIZATION  05/13/2012   mod luminal irregularity of pLAD, no sign  CAD, EF 65%.  . CHOLECYSTECTOMY  1970's  . KYPHOPLASTY N/A 02/10/2013   Procedure:  LUMBAR TWO KYPHOPLASTY;  Surgeon: Ophelia Charter, MD;  Location: [facility];  Service: Neurosurgery;  Laterality: N/A;  L2 Kyphoplasty; Will use Stern's Carm.  Marland Kitchen TOTAL ELBOW REPLACEMENT  ~ 2005   "right" (09/14/2012)  . TOTAL KNEE ARTHROPLASTY  09/13/2012   Procedure: TOTAL KNEE ARTHROPLASTY;  Surgeon: Vickey Huger, MD;  Location: [facility];  Service: Orthopedics;  Laterality: Right;  . TUBAL LIGATION  1970's  . VAGINAL HYSTERECTOMY  1970's   Social History   Occupational History  . Not on file.   Social History Main Topics  . Smoking status: Former Smoker    Packs/day: 0.25    Years: 20.00    Types: Cigarettes    Quit date: 10/13/2009  . Smokeless tobacco: Never Used  . Alcohol use No     Comment: 09/14/2012 "did drink a little in my younger days"  . Drug use: No  . Sexual activity: Not Currently

## 2016-11-27 ENCOUNTER — Ambulatory Visit (INDEPENDENT_AMBULATORY_CARE_PROVIDER_SITE_OTHER): Payer: PPO | Admitting: Internal Medicine

## 2016-11-27 ENCOUNTER — Other Ambulatory Visit (INDEPENDENT_AMBULATORY_CARE_PROVIDER_SITE_OTHER): Payer: PPO

## 2016-11-27 VITALS — BP 142/90 | HR 94 | Temp 97.5°F | Resp 16 | Ht 64.0 in | Wt 232.1 lb

## 2016-11-27 DIAGNOSIS — E781 Pure hyperglyceridemia: Secondary | ICD-10-CM

## 2016-11-27 DIAGNOSIS — I10 Essential (primary) hypertension: Secondary | ICD-10-CM | POA: Diagnosis not present

## 2016-11-27 DIAGNOSIS — IMO0002 Reserved for concepts with insufficient information to code with codable children: Secondary | ICD-10-CM

## 2016-11-27 DIAGNOSIS — E1149 Type 2 diabetes mellitus with other diabetic neurological complication: Secondary | ICD-10-CM

## 2016-11-27 DIAGNOSIS — E1165 Type 2 diabetes mellitus with hyperglycemia: Secondary | ICD-10-CM

## 2016-11-27 DIAGNOSIS — Z1231 Encounter for screening mammogram for malignant neoplasm of breast: Secondary | ICD-10-CM

## 2016-11-27 LAB — BASIC METABOLIC PANEL
BUN: 16 mg/dL (ref 6–23)
CO2: 29 mEq/L (ref 19–32)
Calcium: 9.4 mg/dL (ref 8.4–10.5)
Chloride: 104 mEq/L (ref 96–112)
Creatinine, Ser: 0.83 mg/dL (ref 0.40–1.20)
GFR: 73.39 mL/min (ref >60.00)
Glucose, Bld: 229 mg/dL — ABNORMAL HIGH (ref 70–99)
Potassium: 3.7 mEq/L (ref 3.5–5.1)
Sodium: 139 mEq/L (ref 135–145)

## 2016-11-27 LAB — HEMOGLOBIN A1C: Hgb A1c MFr Bld: 8.7 % — ABNORMAL HIGH (ref 4.6–6.5)

## 2016-11-27 LAB — TRIGLYCERIDES: Triglycerides: 268 mg/dL — ABNORMAL HIGH (ref 0.0–149.0)

## 2016-11-27 MED ORDER — INSULIN GLARGINE 100 UNIT/ML SOLOSTAR PEN: 50.0000 [IU] | PEN_INJECTOR | Freq: Every day | SUBCUTANEOUS | 11 refills | Status: DC

## 2016-11-27 MED ORDER — GLIMEPIRIDE 2 MG PO TABS: 2.0000 mg | ORAL_TABLET | Freq: Every day | ORAL | 1 refills | Status: DC

## 2016-11-27 MED ORDER — METFORMIN HCL 1000 MG PO TABS: 1000.0000 mg | ORAL_TABLET | Freq: Two times a day (BID) | ORAL | 1 refills | Status: DC

## 2016-11-27 NOTE — Progress Notes (Signed)
Subjective:  Patient ID: Abigail Collins, female    DOB: 11-14-1950  Age: 66 y.o. MRN: [id number]  CC: Hypertension; Diabetes; and Hyperlipidemia   HPI Abigail Collins presents for Follow-up. She has been out of meds for about a week or 2 and is therefore  concerned that her blood pressure and blood sugar have not been well controlled. She has otherwise felt well lately with no chest pain, shortness of breath, DOE, palpitations, edema, or fatigue. She denies polys. She has intentionally lost about 13 pounds.  Outpatient Medications Prior to Visit  Medication Sig Dispense Refill  . Blood Glucose Monitoring Suppl (ONETOUCH VERIO IQ SYSTEM) W/DEVICE KIT 1 Act by Does not apply route 3 (three) times daily. 2 kit 0  . budesonide-formoterol (SYMBICORT) 80-4.5 MCG/ACT inhaler Inhale 2 puffs into the lungs 2 (two) times daily. 1 Inhaler 11  . glucose blood (ONETOUCH VERIO) test strip Use TID 240 each 11  . HYDROcodone-acetaminophen (NORCO) 5-325 MG tablet Take 1 tablet by mouth every 6 (six) hours as needed. 30 tablet 0  . diclofenac (CATAFLAM) 50 MG tablet Take 50 mg by mouth.    . fluconazole (DIFLUCAN) 100 MG tablet Take 1 tablet (100 mg total) by mouth daily. 2 tablet 0  . glimepiride (AMARYL) 2 MG tablet Take 1 tablet (2 mg total) by mouth daily before breakfast. Due for follow-up appt w/labs Collins see MD for future refills 30 tablet 0  . metFORMIN (GLUCOPHAGE) 1000 MG tablet Take 1 tablet (1,000 mg total) by mouth 2 (two) times daily with a meal. Due for follow-up appt w/labs Collins see MD for future refills 60 tablet 0  . TOUJEO SOLOSTAR 300 UNIT/ML SOPN Inject 50 Units into the skin at bedtime. 1.5 mL 11  . albuterol (PROVENTIL) (2.5 MG/3ML) 0.083% nebulizer solution Inhale 2.5 mg into the lungs every 6 (six) hours as needed for wheezing or shortness of breath. Reported on 01/17/2016    . Insulin Pen Needle (NOVOFINE) 32G X 6 MM MISC 1 Act by Does not apply route daily. (Patient not taking:  Reported on 11/27/2016) 100 each 3  . benzonatate (TESSALON) 100 MG capsule 1-2 tab by mouth every 6 hrs as needed (Patient not taking: Reported on 11/27/2016) 60 capsule 1  . telmisartan-hydrochlorothiazide (MICARDIS HCT) 80-12.5 MG tablet Take 1 tablet by mouth daily. (Patient not taking: Reported on 11/27/2016) 90 tablet 1   No facility-administered medications prior to visit.     ROS Review of Systems  Constitutional: Negative.  Negative for appetite change, diaphoresis, fatigue and unexpected weight change.  HENT: Negative.   Eyes: Negative for visual disturbance.  Respiratory: Negative for cough, chest tightness, shortness of breath and wheezing.   Cardiovascular: Negative for chest pain, palpitations and leg swelling.  Gastrointestinal: Negative for abdominal pain, constipation, diarrhea, nausea and vomiting.  Endocrine: Negative for polydipsia, polyphagia and polyuria.  Genitourinary: Negative.  Negative for decreased urine volume, difficulty urinating, dysuria and urgency.  Musculoskeletal: Positive for arthralgias. Negative for joint swelling, myalgias and neck pain.  Skin: Negative.  Negative for color change and rash.  Allergic/Immunologic: Negative.   Neurological: Negative.   Hematological: Negative for adenopathy. Does not bruise/bleed easily.  Psychiatric/Behavioral: Negative.     Objective:  BP (!) 142/90 (BP Location: Left Arm, Patient Position: Sitting, Cuff Size: Large)   Pulse 94   Temp 97.5 F (36.4 C) (Oral)   Resp 16   Ht 5' 4"  (1.626 m)   Wt 232 lb 1.9 oz (105.3  kg)   SpO2 96%   BMI 39.84 kg/m   BP Readings from Last 3 Encounters:  11/27/16 (!) 142/90  10/29/16 (!) 140/101  06/19/16 130/70    Wt Readings from Last 3 Encounters:  11/27/16 232 lb 1.9 oz (105.3 kg)  06/19/16 245 lb (111.1 kg)  05/27/16 242 lb 12 oz (110.1 kg)    Physical Exam  Constitutional: She is oriented to person, place, and time. No distress.  HENT:  Mouth/Throat:  Oropharynx is clear and moist. No oropharyngeal exudate.  Eyes: Conjunctivae are normal. Right eye exhibits no discharge. Left eye exhibits no discharge. No scleral icterus.  Neck: Normal range of motion. Neck supple. No JVD present. No tracheal deviation present. No thyromegaly present.  Cardiovascular: Normal rate, regular rhythm, normal heart sounds and intact distal pulses.  Exam reveals no gallop and no friction rub.   No murmur heard. Pulmonary/Chest: Effort normal and breath sounds normal. No stridor. No respiratory distress. She has no wheezes. She has no rales. She exhibits no tenderness.  Abdominal: Soft. Bowel sounds are normal. She exhibits no distension and no mass. There is no tenderness. There is no rebound and no guarding.  Musculoskeletal: Normal range of motion. She exhibits no edema, tenderness or deformity.  Lymphadenopathy:    She has no cervical adenopathy.  Neurological: She is oriented to person, place, and time.  Skin: Skin is warm and dry. No rash noted. She is not diaphoretic. No erythema. No pallor.  Vitals reviewed.   Lab Results  Component Value Date   WBC 9.2 12/27/2015   HGB 14.1 12/27/2015   HCT 42.4 12/27/2015   PLT 170.0 12/27/2015   GLUCOSE 229 (H) 11/27/2016   CHOL 171 05/27/2016   TRIG 268.0 (H) 11/27/2016   HDL 57.40 05/27/2016   LDLDIRECT 74.0 05/27/2016   LDLCALC UNABLE TO CALCULATE IF TRIGLYCERIDE OVER 400 mg/dL 04/06/2012   ALT 22 05/27/2016   AST 21 05/27/2016   NA 139 11/27/2016   K 3.7 11/27/2016   CL 104 11/27/2016   CREATININE 0.83 11/27/2016   BUN 16 11/27/2016   CO2 29 11/27/2016   TSH 1.40 12/27/2015   INR 0.88 09/03/2012   HGBA1C 8.7 (H) 11/27/2016   MICROALBUR 6.1 (H) 05/27/2016    Dg Elbow Complete Right  Result Date: 10/29/2016 CLINICAL DATA:  Fall at home landing on right elbow. Right elbow pain. EXAM: RIGHT ELBOW - COMPLETE 3+ VIEW COMPARISON:  Preoperative radiographs 11/14/2004 FINDINGS: Comminuted fracture of the  distal humerus. Fracture extends to the articular surface with 2 mm distraction. Minimally displaced component involves the medial and lateral humeral epicondyles as well as lateral metaphyseal cortex. There is an associated joint effusion. Radial head prosthesis in place without periprosthetic fracture. IMPRESSION: Comminuted mildly displaced intra-articular distal humerus fracture. Fracture involves the medial and lateral epicondyles as well as is lateral metaphyseal cortex. Radial head prosthesis in place without periprosthetic fracture. Electronically Signed   By: Jeb Levering M.D.   On: 10/29/2016 05:21   Ct Head Wo Contrast  Result Date: 10/29/2016 CLINICAL DATA:  Trip and fall with head injury. Unknown loss of consciousness. EXAM: CT HEAD WITHOUT CONTRAST TECHNIQUE: Contiguous axial images were obtained from the base of the skull through the vertex without intravenous contrast. COMPARISON:  None. FINDINGS: Brain: No evidence of acute infarction, hemorrhage, hydrocephalus, extra-axial collection or mass lesion/mass effect. Vascular: No hyperdense vessel or unexpected calcification. Skull: Normal. Negative for fracture or focal lesion. Sinuses/Orbits: No acute finding. Other: Left frontal scalp  hematoma. IMPRESSION: Left frontal scalp hematoma without acute intracranial abnormality or skull fracture. Electronically Signed   By: Jeb Levering M.D.   On: 10/29/2016 06:48   Dg Knee Complete 4 Views Left  Result Date: 10/29/2016 CLINICAL DATA:  Fall at home with left knee pain. EXAM: LEFT KNEE - COMPLETE 4+ VIEW COMPARISON:  None. FINDINGS: No acute fracture or dislocation. Tricompartmental osteoarthritis with peripheral spurring. Mild lateral tibiofemoral joint space narrowing. Possible small knee joint effusion. IMPRESSION: Tricompartmental osteoarthritis without acute fracture or subluxation. Possible small knee joint effusion. Electronically Signed   By: Jeb Levering M.D.   On: 10/29/2016  06:33    Assessment & Plan:   Laiah was seen today for hypertension, diabetes and hyperlipidemia.  Diagnoses and all orders for this visit:  HTN (hypertension), benign - Her blood pressure is not adequately well controlled, will restart an ARB. -     Basic metabolic panel; Future -     losartan (COZAAR) 100 MG tablet; Take 1 tablet (100 mg total) by mouth daily.  Pure hyperglyceridemia- her triglycerides are slightly elevated but do not require any further treatment at this time. -     Triglycerides; Future  Diabetes mellitus with neurological manifestations, uncontrolled ([place])- her A1c is up to 8.7%, her blood sugars are not adequately well controlled, will restart the basal insulin as well as metformin and the sulfonylurea. -     Hemoglobin A1c; Future -     Basic metabolic panel; Future -     Insulin Glargine (LANTUS SOLOSTAR) 100 UNIT/ML Solostar Pen; Inject 50 Units into the skin daily at 10 pm. -     glimepiride (AMARYL) 2 MG tablet; Take 1 tablet (2 mg total) by mouth daily before breakfast. -     metFORMIN (GLUCOPHAGE) 1000 MG tablet; Take 1 tablet (1,000 mg total) by mouth 2 (two) times daily with a meal. -     C-peptide; Future -     Ambulatory referral to Ophthalmology -     losartan (COZAAR) 100 MG tablet; Take 1 tablet (100 mg total) by mouth daily.  Visit for screening mammogram -     MM DIGITAL SCREENING BILATERAL; Future   I have discontinued Ms. Bearman's TOUJEO SOLOSTAR, telmisartan-hydrochlorothiazide, diclofenac, benzonatate, and fluconazole. I have also changed her glimepiride and metFORMIN. Additionally, I am having her start on Insulin Glargine and losartan. Lastly, I am having her maintain her ONETOUCH VERIO IQ SYSTEM, albuterol, budesonide-formoterol, glucose blood, Insulin Pen Needle, and HYDROcodone-acetaminophen.  Meds ordered this encounter  Medications  . Insulin Glargine (LANTUS SOLOSTAR) 100 UNIT/ML Solostar Pen    Sig: Inject 50 Units into the  skin daily at 10 pm.    Dispense:  15 mL    Refill:  11  . glimepiride (AMARYL) 2 MG tablet    Sig: Take 1 tablet (2 mg total) by mouth daily before breakfast.    Dispense:  90 tablet    Refill:  1  . metFORMIN (GLUCOPHAGE) 1000 MG tablet    Sig: Take 1 tablet (1,000 mg total) by mouth 2 (two) times daily with a meal.    Dispense:  180 tablet    Refill:  1  . losartan (COZAAR) 100 MG tablet    Sig: Take 1 tablet (100 mg total) by mouth daily.    Dispense:  90 tablet    Refill:  3     Follow-up: Return in about 4 months (around 03/27/2017).  Scarlette Calico, MD

## 2016-11-27 NOTE — Progress Notes (Signed)
Pre visit review using our clinic review tool, if applicable. No additional management support is needed unless otherwise documented below in the visit note. 

## 2016-11-27 NOTE — Patient Instructions (Signed)

## 2016-11-28 LAB — C-PEPTIDE: C-Peptide: 5.86 ng/mL — ABNORMAL HIGH (ref 0.80–3.85)

## 2016-11-29 ENCOUNTER — Encounter: Payer: Self-pay | Admitting: Internal Medicine

## 2016-11-29 MED ORDER — LOSARTAN POTASSIUM 100 MG PO TABS: 100.0000 mg | ORAL_TABLET | Freq: Every day | ORAL | 3 refills | Status: DC

## 2016-12-11 ENCOUNTER — Ambulatory Visit (INDEPENDENT_AMBULATORY_CARE_PROVIDER_SITE_OTHER): Payer: PPO | Admitting: Orthopedic Surgery

## 2016-12-11 ENCOUNTER — Encounter (INDEPENDENT_AMBULATORY_CARE_PROVIDER_SITE_OTHER): Payer: Self-pay | Admitting: Orthopedic Surgery

## 2016-12-11 ENCOUNTER — Ambulatory Visit (INDEPENDENT_AMBULATORY_CARE_PROVIDER_SITE_OTHER): Payer: PPO

## 2016-12-11 DIAGNOSIS — S42492A Other displaced fracture of lower end of left humerus, initial encounter for closed fracture: Secondary | ICD-10-CM

## 2016-12-11 NOTE — Progress Notes (Signed)
Post-Op Visit Note   Patient: Abigail Collins           Date of Birth: 1951/08/24           MRN: [id number] Visit Date: 12/11/2016 PCP: Scarlette Calico, MD   Assessment & Plan:  Chief Complaint:  Chief Complaint  Patient presents with  . Right Upper Arm - Fracture, Follow-up   Visit Diagnoses:  1. Closed bicondylar fracture of distal humerus, left, initial encounter     Plan: Berdean is now about 6 weeks out right distal humerus fracture.  She's been doing well.  She's regained some range of motion.  She is No pain to palpation of the distal humeral area.  She's lacking about 45 of full extension and at 30 of full flexion which actually is improving.  She was very stiff to begin with from prior surgery.  Radiographs showed no change in alignment.  I would have her status sling continue with range of motion exercises but no pushing or lifting anything anyway with that right arm until I see her back in 4 weeks.  Repeat radiographs then low-Up Instructions: Return in about 4 weeks (around 01/08/2017).   Orders:  Orders Placed This Encounter  Procedures  . XR Elbow 2 Views Right   No orders of the defined types were placed in this encounter.   Imaging: Xr Elbow 2 Views Right  Result Date: 12/11/2016 2 views right distal humerus shows fracture consolidation with no change in alignment.  Very small amount of early callus formation is present.  Joint remains located.   PMFS History: Patient Active Problem List   Diagnosis Date Noted  . Closed bicondylar fracture of distal humerus, left, initial encounter 10/31/2016  . Excessive daytime sleepiness 05/27/2016  . COPD mixed type ([place]) 01/09/2016  . Visit for screening mammogram 03/28/2015  . Colon cancer screening 03/28/2015  . Depression with somatization 03/28/2015  . Insomnia 03/28/2015  . Primary osteoarthritis of both knees 03/28/2015  . Allergic rhinitis, cause unspecified 03/15/2014  . Hyperlipidemia with target LDL less  than 100 12/22/2013  . Routine general medical examination at a health care facility 08/03/2013  . Lumbar compression fracture ([place]) 11/01/2012  . Pure hyperglyceridemia 08/05/2012  . Gastroparesis due to DM ([place]) 08/05/2012  . Other screening mammogram 08/05/2012  . Diabetes mellitus with neurological manifestations, uncontrolled ([place]) 04/05/2012  . HTN (hypertension), benign 04/05/2012  . GERD (gastroesophageal reflux disease) 04/05/2012  . Morbid obesity ([place]) 04/05/2012  . Left knee DJD 06/19/2011   Past Medical History:  Diagnosis Date  . Anemia    hx of  . Anginal pain ([place])    "left arm pain, sees Dr. Etter Sjogren, had card cath 2013"  . Arthritis    "all over" (09/14/2012)  . Asthma    takes inhaler 2x day  . Bronchitis    hx of  . Bulging disc    "lower"  . Carpal tunnel syndrome of left wrist   . Depression    denies  . Exertional dyspnea    "sometimes laying down" (09/14/2012)  . Hyperlipidemia   . Hypertension    sees Dr. Debby Freiberg, primary  . Pneumonia 03/2012  . PONV (postoperative nausea and vomiting)   . Thyroid disease 1960's   "don't have it now" (09/14/2012)  . Type II diabetes mellitus ([place])   . Urinary tract infection    hx of  . Vomiting    pt states she vomits every am    Family  History  Problem Relation Age of Onset  . Arthritis Mother   . Arthritis Father   . Hypertension Father   . Diabetes Father   . Colon cancer Neg Hx     Past Surgical History:  Procedure Laterality Date  . CARDIAC CATHETERIZATION  05/13/2012   mod luminal irregularity of pLAD, no sign CAD, EF 65%.  . CHOLECYSTECTOMY  1970's  . KYPHOPLASTY N/A 02/10/2013   Procedure:  LUMBAR TWO KYPHOPLASTY;  Surgeon: Ophelia Charter, MD;  Location: [facility];  Service: Neurosurgery;  Laterality: N/A;  L2 Kyphoplasty; Will use Stern's Carm.  Marland Kitchen TOTAL ELBOW REPLACEMENT  ~ 2005   "right" (09/14/2012)  . TOTAL KNEE ARTHROPLASTY  09/13/2012   Procedure: TOTAL KNEE ARTHROPLASTY;  Surgeon:  Vickey Huger, MD;  Location: [facility];  Service: Orthopedics;  Laterality: Right;  . TUBAL LIGATION  1970's  . VAGINAL HYSTERECTOMY  1970's   Social History   Occupational History  . Not on file.   Social History Main Topics  . Smoking status: Former Smoker    Packs/day: 0.25    Years: 20.00    Types: Cigarettes    Quit date: 10/13/2009  . Smokeless tobacco: Never Used  . Alcohol use No     Comment: 09/14/2012 "did drink a little in my younger days"  . Drug use: No  . Sexual activity: Not Currently

## 2016-12-15 ENCOUNTER — Telehealth: Payer: Self-pay | Admitting: Internal Medicine

## 2016-12-15 NOTE — Telephone Encounter (Signed)
Pt is needing a letter stating why she is not able to be there for jury duty.    Fax number [phone] Did to day that pt is disabled and not able to attend jury duty. Pt has to walk with cane and has a broken arm.

## 2016-12-16 NOTE — Telephone Encounter (Signed)
Okay to write letter 

## 2016-12-16 NOTE — Telephone Encounter (Signed)
YES

## 2016-12-18 NOTE — Telephone Encounter (Signed)
Called to r/s appt on 01/08/17 with dr.dean, he has an add on surgery that day. No answer, voicemail cut off midway through message.

## 2016-12-18 NOTE — Telephone Encounter (Signed)
Letter faxed to number below.

## 2016-12-25 ENCOUNTER — Ambulatory Visit: Payer: PPO | Admitting: Internal Medicine

## 2017-01-08 ENCOUNTER — Ambulatory Visit (INDEPENDENT_AMBULATORY_CARE_PROVIDER_SITE_OTHER): Payer: PPO | Admitting: Orthopedic Surgery

## 2017-01-15 ENCOUNTER — Ambulatory Visit (INDEPENDENT_AMBULATORY_CARE_PROVIDER_SITE_OTHER): Payer: PPO | Admitting: Orthopedic Surgery

## 2017-04-05 ENCOUNTER — Other Ambulatory Visit: Payer: Self-pay | Admitting: Internal Medicine

## 2017-04-05 DIAGNOSIS — IMO0002 Reserved for concepts with insufficient information to code with codable children: Secondary | ICD-10-CM

## 2017-04-05 DIAGNOSIS — E1165 Type 2 diabetes mellitus with hyperglycemia: Secondary | ICD-10-CM

## 2017-04-05 DIAGNOSIS — E1149 Type 2 diabetes mellitus with other diabetic neurological complication: Secondary | ICD-10-CM

## 2017-05-15 ENCOUNTER — Telehealth: Payer: Self-pay | Admitting: Internal Medicine

## 2017-05-15 NOTE — Telephone Encounter (Signed)
Pt called requesting refills on Metformin, Glimepiride and Toujeo to [facility] on [address]. She also mentioned that she was previously prescribed a blood pressure medication that she could not afford. She has changed insurance companies and would like to try it again. She did not know the name of it. She mentioned that she wanted propranolol.

## 2017-05-15 NOTE — Telephone Encounter (Signed)
Will forward to MD assistant I'm not sure about BP medication. Per chart MD rx Losartan back on 11/29/16, and per MD instructions she was suppose to come back in June, and she did not. Pls advise on refills...Abigail Collins

## 2017-05-18 NOTE — Telephone Encounter (Signed)
Will you see if patient can come in to have the request adressed.

## 2017-05-18 NOTE — Telephone Encounter (Signed)
1. Pt is requesting to be rx'ed [place] (new insurance)  2. Will call pt have have her schedule an appt before filling any maintenance medications.

## 2017-05-18 NOTE — Telephone Encounter (Signed)
She needs to be seen.

## 2017-05-19 NOTE — Telephone Encounter (Signed)
LM for pt to schedule an appointment.

## 2017-05-26 ENCOUNTER — Ambulatory Visit (INDEPENDENT_AMBULATORY_CARE_PROVIDER_SITE_OTHER): Payer: PPO | Admitting: Internal Medicine

## 2017-05-26 ENCOUNTER — Encounter: Payer: Self-pay | Admitting: Internal Medicine

## 2017-05-26 ENCOUNTER — Other Ambulatory Visit (INDEPENDENT_AMBULATORY_CARE_PROVIDER_SITE_OTHER): Payer: PPO

## 2017-05-26 VITALS — BP 160/110 | HR 87 | Temp 97.7°F | Resp 16 | Ht 64.0 in | Wt 230.0 lb

## 2017-05-26 DIAGNOSIS — E1165 Type 2 diabetes mellitus with hyperglycemia: Secondary | ICD-10-CM | POA: Diagnosis not present

## 2017-05-26 DIAGNOSIS — E1149 Type 2 diabetes mellitus with other diabetic neurological complication: Secondary | ICD-10-CM | POA: Diagnosis not present

## 2017-05-26 DIAGNOSIS — I10 Essential (primary) hypertension: Secondary | ICD-10-CM

## 2017-05-26 DIAGNOSIS — E781 Pure hyperglyceridemia: Secondary | ICD-10-CM

## 2017-05-26 DIAGNOSIS — IMO0002 Reserved for concepts with insufficient information to code with codable children: Secondary | ICD-10-CM

## 2017-05-26 DIAGNOSIS — M1712 Unilateral primary osteoarthritis, left knee: Secondary | ICD-10-CM | POA: Diagnosis not present

## 2017-05-26 DIAGNOSIS — M170 Bilateral primary osteoarthritis of knee: Secondary | ICD-10-CM

## 2017-05-26 DIAGNOSIS — S32010G Wedge compression fracture of first lumbar vertebra, subsequent encounter for fracture with delayed healing: Secondary | ICD-10-CM | POA: Diagnosis not present

## 2017-05-26 DIAGNOSIS — E785 Hyperlipidemia, unspecified: Secondary | ICD-10-CM

## 2017-05-26 LAB — CBC WITH DIFFERENTIAL/PLATELET
Basophils Absolute: 0.1 10*3/uL (ref 0.0–0.1)
Basophils Relative: 0.9 % (ref 0.0–3.0)
Eosinophils Absolute: 0.2 10*3/uL (ref 0.0–0.7)
Eosinophils Relative: 2.4 % (ref 0.0–5.0)
HCT: 45.2 % (ref 36.0–46.0)
Hemoglobin: 15 g/dL (ref 12.0–15.0)
Lymphocytes Relative: 41 % (ref 12.0–46.0)
Lymphs Abs: 3.7 10*3/uL (ref 0.7–4.0)
MCHC: 33.2 g/dL (ref 30.0–36.0)
MCV: 92.4 fl (ref 78.0–100.0)
Monocytes Absolute: 0.4 10*3/uL (ref 0.1–1.0)
Monocytes Relative: 5 % (ref 3.0–12.0)
Neutro Abs: 4.5 10*3/uL (ref 1.4–7.7)
Neutrophils Relative %: 50.7 % (ref 43.0–77.0)
Platelets: 199 10*3/uL (ref 150.0–400.0)
RBC: 4.89 Mil/uL (ref 3.87–5.11)
RDW: 13.9 % (ref 11.5–15.5)
WBC: 8.9 10*3/uL (ref 4.0–10.5)

## 2017-05-26 LAB — COMPREHENSIVE METABOLIC PANEL
ALT: 22 U/L (ref 0–35)
AST: 29 U/L (ref 0–37)
Albumin: 4.6 g/dL (ref 3.5–5.2)
Alkaline Phosphatase: 72 U/L (ref 39–117)
BUN: 15 mg/dL (ref 6–23)
CO2: 29 mEq/L (ref 19–32)
Calcium: 9.5 mg/dL (ref 8.4–10.5)
Chloride: 104 mEq/L (ref 96–112)
Creatinine, Ser: 0.69 mg/dL (ref 0.40–1.20)
GFR: 90.68 mL/min (ref >60.00)
Glucose, Bld: 172 mg/dL — ABNORMAL HIGH (ref 70–99)
Potassium: 3.8 mEq/L (ref 3.5–5.1)
Sodium: 140 mEq/L (ref 135–145)
Total Bilirubin: 0.6 mg/dL (ref 0.2–1.2)
Total Protein: 7.1 g/dL (ref 6.0–8.3)

## 2017-05-26 LAB — URINALYSIS, ROUTINE W REFLEX MICROSCOPIC
Hgb urine dipstick: NEGATIVE
Leukocytes, UA: NEGATIVE
Nitrite: NEGATIVE
Specific Gravity, Urine: >=1.03 — AB (ref 1.000–1.030)
Urine Glucose: 100 — AB
Urobilinogen, UA: 0.2 (ref 0.0–1.0)
pH: 5.5 (ref 5.0–8.0)

## 2017-05-26 LAB — LIPID PANEL
Cholesterol: 174 mg/dL (ref 0–200)
HDL: 51.2 mg/dL (ref >39.00)
NonHDL: 122.63
Total CHOL/HDL Ratio: 3
Triglycerides: 339 mg/dL — ABNORMAL HIGH (ref 0.0–149.0)
VLDL: 67.8 mg/dL — ABNORMAL HIGH (ref 0.0–40.0)

## 2017-05-26 LAB — POCT GLUCOSE (DEVICE FOR HOME USE): Glucose Fasting, POC: 168 mg/dL — AB (ref 70–99)

## 2017-05-26 LAB — LDL CHOLESTEROL, DIRECT: Direct LDL: 77 mg/dL

## 2017-05-26 LAB — MICROALBUMIN / CREATININE URINE RATIO
Creatinine,U: 205.1 mg/dL
Microalb Creat Ratio: 2.6 mg/g (ref 0.0–30.0)
Microalb, Ur: 5.3 mg/dL — ABNORMAL HIGH (ref 0.0–1.9)

## 2017-05-26 LAB — TSH: TSH: 2.38 u[IU]/mL (ref 0.35–4.50)

## 2017-05-26 LAB — POCT GLYCOSYLATED HEMOGLOBIN (HGB A1C): Hemoglobin A1C: 8.7

## 2017-05-26 MED ORDER — IRBESARTAN 300 MG PO TABS: 300.0000 mg | ORAL_TABLET | Freq: Every day | ORAL | 1 refills | Status: DC

## 2017-05-26 MED ORDER — INSULIN GLARGINE 300 UNIT/ML ~~LOC~~ SOPN: 50.0000 [IU] | PEN_INJECTOR | Freq: Every day | SUBCUTANEOUS | 5 refills | Status: DC

## 2017-05-26 MED ORDER — CANAGLIFLOZIN 100 MG PO TABS: 100.0000 mg | ORAL_TABLET | Freq: Every day | ORAL | 1 refills | Status: DC

## 2017-05-26 MED ORDER — METFORMIN HCL 1000 MG PO TABS: 1000.0000 mg | ORAL_TABLET | Freq: Two times a day (BID) | ORAL | 1 refills | Status: DC

## 2017-05-26 MED ORDER — GLIMEPIRIDE 2 MG PO TABS: 2.0000 mg | ORAL_TABLET | Freq: Every day | ORAL | 1 refills | Status: DC

## 2017-05-26 MED ORDER — HYDROCODONE-ACETAMINOPHEN 5-325 MG PO TABS: 1.0000 | ORAL_TABLET | Freq: Four times a day (QID) | ORAL | 0 refills | Status: DC | PRN

## 2017-05-26 MED ORDER — CHLORTHALIDONE 25 MG PO TABS: 25.0000 mg | ORAL_TABLET | Freq: Every day | ORAL | 1 refills | Status: DC

## 2017-05-26 NOTE — Patient Instructions (Signed)

## 2017-05-26 NOTE — Progress Notes (Signed)
Subjective:  Patient ID: Abigail Collins, female    DOB: Nov 29, 1950  Age: 66 y.o. MRN: [id number]  CC: Hypertension; Hyperlipidemia; and Diabetes   HPI Abigail Collins presents for f/up - Her blood pressure and blood sugar have not been well controlled. She has not been able to buy her medications recently. She has had a blood sugar as high as 200 but most of the blood sugars are in the 150-200 range. She's had a few episodes of blurred vision and headache. She denies chest pain, shortness of breath, palpitations, edema, or fatigue.  Outpatient Medications Prior to Visit  Medication Sig Dispense Refill  . albuterol (PROVENTIL) (2.5 MG/3ML) 0.083% nebulizer solution Inhale 2.5 mg into the lungs every 6 (six) hours as needed for wheezing or shortness of breath. Reported on 01/17/2016    . Blood Glucose Monitoring Suppl (ONETOUCH VERIO IQ SYSTEM) W/DEVICE KIT 1 Act by Does not apply route 3 (three) times daily. 2 kit 0  . budesonide-formoterol (SYMBICORT) 80-4.5 MCG/ACT inhaler Inhale 2 puffs into the lungs 2 (two) times daily. 1 Inhaler 11  . Insulin Pen Needle (NOVOFINE) 32G X 6 MM MISC 1 Act by Does not apply route daily. 100 each 3  . ONETOUCH VERIO test strip TEST 3 TIMES A DAY 200 each 11  . glimepiride (AMARYL) 2 MG tablet Take 1 tablet (2 mg total) by mouth daily before breakfast. 90 tablet 1  . HYDROcodone-acetaminophen (NORCO) 5-325 MG tablet Take 1 tablet by mouth every 6 (six) hours as needed. 30 tablet 0  . Insulin Glargine (LANTUS SOLOSTAR) 100 UNIT/ML Solostar Pen Inject 50 Units into the skin daily at 10 pm. 15 mL 11  . losartan (COZAAR) 100 MG tablet Take 1 tablet (100 mg total) by mouth daily. 90 tablet 3  . metFORMIN (GLUCOPHAGE) 1000 MG tablet Take 1 tablet (1,000 mg total) by mouth 2 (two) times daily with a meal. 180 tablet 1   No facility-administered medications prior to visit.     ROS Review of Systems  Constitutional: Negative for appetite change, chills,  diaphoresis, fatigue and unexpected weight change.  HENT: Negative.   Eyes: Positive for visual disturbance. Negative for photophobia and pain.  Respiratory: Negative for cough, chest tightness, shortness of breath and wheezing.   Cardiovascular: Negative for chest pain, palpitations and leg swelling.  Gastrointestinal: Negative for abdominal pain, constipation, diarrhea, nausea and vomiting.  Endocrine: Negative for polydipsia, polyphagia and polyuria.  Genitourinary: Negative.  Negative for decreased urine volume, difficulty urinating, dysuria, frequency and urgency.  Musculoskeletal: Positive for arthralgias and back pain. Negative for myalgias and neck pain.  Skin: Negative.   Allergic/Immunologic: Negative.   Neurological: Positive for headaches. Negative for dizziness, syncope, weakness and numbness.  Hematological: Negative for adenopathy. Does not bruise/bleed easily.  Psychiatric/Behavioral: Negative.  Negative for dysphoric mood and sleep disturbance. The patient is not nervous/anxious.     Objective:  BP (!) 160/110 (BP Location: Left Arm, Patient Position: Sitting, Cuff Size: Normal)   Pulse 87   Temp 97.7 F (36.5 C) (Oral)   Resp 16   Ht 5' 4"  (1.626 m)   Wt 230 lb (104.3 kg)   SpO2 98%   BMI 39.48 kg/m   BP Readings from Last 3 Encounters:  05/26/17 (!) 160/110  11/27/16 (!) 142/90  10/29/16 (!) 140/101    Wt Readings from Last 3 Encounters:  05/26/17 230 lb (104.3 kg)  11/27/16 232 lb 1.9 oz (105.3 kg)  06/19/16 245 lb (111.1  kg)    Physical Exam  Constitutional: She is oriented to person, place, and time. No distress.  HENT:  Mouth/Throat: Oropharynx is clear and moist. No oropharyngeal exudate.  Eyes: Conjunctivae are normal. Right eye exhibits no discharge. Left eye exhibits no discharge. No scleral icterus.  Neck: Normal range of motion. Neck supple. No JVD present. No thyromegaly present.  Cardiovascular: Normal rate, regular rhythm and intact distal  pulses.  Exam reveals no gallop and no friction rub.   No murmur heard. Pulmonary/Chest: Effort normal and breath sounds normal. No respiratory distress. She has no wheezes. She has no rales. She exhibits no tenderness.  Abdominal: Soft. Bowel sounds are normal. She exhibits no distension and no mass. There is no tenderness. There is no rebound and no guarding.  Musculoskeletal: Normal range of motion. She exhibits no edema, tenderness or deformity.  Lymphadenopathy:    She has no cervical adenopathy.  Neurological: She is alert and oriented to person, place, and time.  Skin: Skin is warm and dry. No rash noted. She is not diaphoretic. No erythema. No pallor.  Vitals reviewed.   Lab Results  Component Value Date   WBC 8.9 05/26/2017   HGB 15.0 05/26/2017   HCT 45.2 05/26/2017   PLT 199.0 05/26/2017   GLUCOSE 172 (H) 05/26/2017   CHOL 174 05/26/2017   TRIG 339.0 (H) 05/26/2017   HDL 51.20 05/26/2017   LDLDIRECT 77.0 05/26/2017   LDLCALC UNABLE TO CALCULATE IF TRIGLYCERIDE OVER 400 mg/dL 04/06/2012   ALT 22 05/26/2017   AST 29 05/26/2017   NA 140 05/26/2017   K 3.8 05/26/2017   CL 104 05/26/2017   CREATININE 0.69 05/26/2017   BUN 15 05/26/2017   CO2 29 05/26/2017   TSH 2.38 05/26/2017   INR 0.88 09/03/2012   HGBA1C 8.7 05/26/2017   MICROALBUR 5.3 (H) 05/26/2017    Dg Elbow Complete Right  Result Date: 10/29/2016 CLINICAL DATA:  Fall at home landing on right elbow. Right elbow pain. EXAM: RIGHT ELBOW - COMPLETE 3+ VIEW COMPARISON:  Preoperative radiographs 11/14/2004 FINDINGS: Comminuted fracture of the distal humerus. Fracture extends to the articular surface with 2 mm distraction. Minimally displaced component involves the medial and lateral humeral epicondyles as well as lateral metaphyseal cortex. There is an associated joint effusion. Radial head prosthesis in place without periprosthetic fracture. IMPRESSION: Comminuted mildly displaced intra-articular distal humerus  fracture. Fracture involves the medial and lateral epicondyles as well as is lateral metaphyseal cortex. Radial head prosthesis in place without periprosthetic fracture. Electronically Signed   By: Jeb Levering M.D.   On: 10/29/2016 05:21   Ct Head Wo Contrast  Result Date: 10/29/2016 CLINICAL DATA:  Trip and fall with head injury. Unknown loss of consciousness. EXAM: CT HEAD WITHOUT CONTRAST TECHNIQUE: Contiguous axial images were obtained from the base of the skull through the vertex without intravenous contrast. COMPARISON:  None. FINDINGS: Brain: No evidence of acute infarction, hemorrhage, hydrocephalus, extra-axial collection or mass lesion/mass effect. Vascular: No hyperdense vessel or unexpected calcification. Skull: Normal. Negative for fracture or focal lesion. Sinuses/Orbits: No acute finding. Other: Left frontal scalp hematoma. IMPRESSION: Left frontal scalp hematoma without acute intracranial abnormality or skull fracture. Electronically Signed   By: Jeb Levering M.D.   On: 10/29/2016 06:48   Dg Knee Complete 4 Views Left  Result Date: 10/29/2016 CLINICAL DATA:  Fall at home with left knee pain. EXAM: LEFT KNEE - COMPLETE 4+ VIEW COMPARISON:  None. FINDINGS: No acute fracture or dislocation. Tricompartmental osteoarthritis  with peripheral spurring. Mild lateral tibiofemoral joint space narrowing. Possible small knee joint effusion. IMPRESSION: Tricompartmental osteoarthritis without acute fracture or subluxation. Possible small knee joint effusion. Electronically Signed   By: Jeb Levering M.D.   On: 10/29/2016 06:33    Assessment & Plan:   Abigail Collins was seen today for hypertension, hyperlipidemia and diabetes.  Diagnoses and all orders for this visit:  HTN (hypertension), benign- her blood pressure is not adequately well controlled. Her electrolytes and renal function are normal. There is no evidence of secondary causes or end organ damage. Will start an ARB and thiazide  diuretic to lower her blood pressure. -     Comprehensive metabolic panel; Future -     CBC with Differential/Platelet; Future -     Urinalysis, Routine w reflex microscopic; Future -     chlorthalidone (HYGROTON) 25 MG tablet; Take 1 tablet (25 mg total) by mouth daily. -     irbesartan (AVAPRO) 300 MG tablet; Take 1 tablet (300 mg total) by mouth daily.  Diabetes mellitus with neurological manifestations, uncontrolled ([place])- her A1c is better at 8.7% but her blood sugar is still not adequately well controlled. She has been taking metformin. Will restart basal insulin and will add on an SGLT2 inhibitor. -     Comprehensive metabolic panel; Future -     Cancel: Hemoglobin A1c; Future -     Microalbumin / creatinine urine ratio; Future -     Discontinue: glimepiride (AMARYL) 2 MG tablet; Take 1 tablet (2 mg total) by mouth daily before breakfast. -     metFORMIN (GLUCOPHAGE) 1000 MG tablet; Take 1 tablet (1,000 mg total) by mouth 2 (two) times daily with a meal. -     Insulin Glargine (TOUJEO SOLOSTAR) 300 UNIT/ML SOPN; Inject 50 Units into the skin daily. -     Amb Referral to Nutrition and Diabetic E -     POCT glycosylated hemoglobin (Hb A1C) -     POCT Glucose (Device for Home Use) -     canagliflozin (INVOKANA) 100 MG TABS tablet; Take 1 tablet (100 mg total) by mouth daily before breakfast.  Hyperlipidemia with target LDL less than 100- she is not quite achieved her LDL goal of 70 so I have asked her to start taking a statin for CV risk reduction. -     Lipid panel; Future -     TSH; Future  Pure hyperglyceridemia- will treat this with omega-3 fish oil supplement. -     Lipid panel; Future -     omega-3 acid ethyl esters (LOVAZA) 1 g capsule; Take 2 capsules (2 g total) by mouth 2 (two) times daily.  Morbid obesity ([place])- she agrees to work on her lifestyle modifications to help her lose weight.  Osteoarthritis of left knee, unspecified osteoarthritis type  Closed compression  fracture of L1 lumbar vertebra with delayed healing, subsequent encounter -     HYDROcodone-acetaminophen (NORCO) 5-325 MG tablet; Take 1 tablet by mouth every 6 (six) hours as needed.  Primary osteoarthritis of both knees -     HYDROcodone-acetaminophen (NORCO) 5-325 MG tablet; Take 1 tablet by mouth every 6 (six) hours as needed.   I have discontinued Abigail Collins's Insulin Glargine, glimepiride, losartan, and glimepiride. I am also having her start on chlorthalidone, irbesartan, Insulin Glargine, canagliflozin, and omega-3 acid ethyl esters. Additionally, I am having her maintain her ONETOUCH VERIO IQ SYSTEM, albuterol, budesonide-formoterol, Insulin Pen Needle, ONETOUCH VERIO, metFORMIN, and HYDROcodone-acetaminophen.  Meds ordered this encounter  Medications  . chlorthalidone (HYGROTON) 25 MG tablet    Sig: Take 1 tablet (25 mg total) by mouth daily.    Dispense:  90 tablet    Refill:  1  . irbesartan (AVAPRO) 300 MG tablet    Sig: Take 1 tablet (300 mg total) by mouth daily.    Dispense:  90 tablet    Refill:  1  . DISCONTD: glimepiride (AMARYL) 2 MG tablet    Sig: Take 1 tablet (2 mg total) by mouth daily before breakfast.    Dispense:  90 tablet    Refill:  1  . metFORMIN (GLUCOPHAGE) 1000 MG tablet    Sig: Take 1 tablet (1,000 mg total) by mouth 2 (two) times daily with a meal.    Dispense:  180 tablet    Refill:  1  . Insulin Glargine (TOUJEO SOLOSTAR) 300 UNIT/ML SOPN    Sig: Inject 50 Units into the skin daily.    Dispense:  1.5 mL    Refill:  5  . canagliflozin (INVOKANA) 100 MG TABS tablet    Sig: Take 1 tablet (100 mg total) by mouth daily before breakfast.    Dispense:  90 tablet    Refill:  1  . HYDROcodone-acetaminophen (NORCO) 5-325 MG tablet    Sig: Take 1 tablet by mouth every 6 (six) hours as needed.    Dispense:  65 tablet    Refill:  0  . omega-3 acid ethyl esters (LOVAZA) 1 g capsule    Sig: Take 2 capsules (2 g total) by mouth 2 (two) times daily.     Dispense:  360 capsule    Refill:  1     Follow-up: Return in about 4 weeks (around 06/23/2017).  Scarlette Calico, MD

## 2017-05-27 MED ORDER — ATORVASTATIN CALCIUM 20 MG PO TABS: 20.0000 mg | ORAL_TABLET | Freq: Every day | ORAL | 1 refills | Status: DC

## 2017-05-27 MED ORDER — OMEGA-3-ACID ETHYL ESTERS 1 G PO CAPS: 2.0000 g | ORAL_CAPSULE | Freq: Two times a day (BID) | ORAL | 1 refills | Status: DC

## 2017-06-01 ENCOUNTER — Ambulatory Visit: Payer: PPO

## 2017-06-01 ENCOUNTER — Telehealth: Payer: Self-pay | Admitting: Internal Medicine

## 2017-06-01 VITALS — BP 118/78 | HR 106

## 2017-06-01 DIAGNOSIS — I959 Hypotension, unspecified: Secondary | ICD-10-CM

## 2017-06-01 NOTE — Telephone Encounter (Signed)
[facility] [place] [facility] Patient Name: Abigail Collins DOB: [birth year] Initial Comment Caller was put on a new BP medication last week, has been feeling very tired from it, pt wants to know if she should take her BP sitting up or laying down. BP is 113/89 & pulse is 112 Nurse Assessment Nurse: Markus Daft, RN, Windy Date/Time (Eastern Time): 06/01/2017 3:20:53 PM Confirm and document reason for call. If symptomatic, describe symptoms. ---Caller was recently diagnosed with HTN, and started in the last week on Irbesartan 300 mg and Chlorthalidone 25 mg QD. Since then she has been feeling very tired, and feels "heart pumping out of her chest". HR in 110's. No CP or pressure or SOB. Lightheaded at times, not now. Pt wants to know if she should take her BP sitting up or laying down? While sitting, BP is 113/89 & pulse is 112 earlier today. Just rechecked BP and was 100/74, HR 113. -- RN advised she could take BP sitting up or laying down. Caller verbalized understanding. Does the patient have any new or worsening symptoms? ---Yes Will a triage be completed? ---Yes Related visit to physician within the last 2 weeks? ---Yes Does the PT have any chronic conditions? (i.e. diabetes, asthma, etc.) ---Yes List chronic conditions. ---HTN, Diabetic, bad left knee, right knee replacement Is this a behavioral health or substance abuse call? ---No Guidelines Guideline Title Affirmed Question Affirmed Notes Low Blood Pressure [9] Fall in systolic BP > 20 mm Hg from normal AND [2] dizzy, lightheaded, or weak Final Disposition User Go to ED Now (or PCP triage) Markus Daft, RN, Windy Comments Yesterday, BP 145/90. BP 92/80 just rechecked while sitting and feeling lightheaded and weak. No appts available with DR. Scarlette Calico today. RN will triage with MD. Office staff, Almyra Free, will let MD know to review the msg. Does she need to go on to ER  or can she watch this, be careful and decrease meds?  She is laying down now with feet elevated? Referrals GO TO FACILITY UNDECIDED Disagree/Comply: Comply

## 2017-06-01 NOTE — Telephone Encounter (Signed)
Patient has been advised to come in (someone else is driving her over) to have bp checked--added to nurse schedule today---patient also advised to stop taking chlorthalidone 25 mg tablet per dr Ronnald Ramp

## 2017-06-01 NOTE — Telephone Encounter (Signed)
bp reading in office (nurse visit) today was 118/78, HR 106---per dr Ronnald Ramp, stop chlorthaladone 25mg  tablet and continue to monitor bp readings---call our office back on Thursday and see if she feels better and bp reading is still good---if not feeling better or bp has worsened, we need to see her again for bp recheck---if starts feeling too bad, go to ED--patient repeated back for understanding

## 2017-06-03 ENCOUNTER — Telehealth: Payer: Self-pay | Admitting: Internal Medicine

## 2017-06-03 ENCOUNTER — Other Ambulatory Visit: Payer: Self-pay | Admitting: Internal Medicine

## 2017-06-03 MED ORDER — FLUCONAZOLE 150 MG PO TABS: 150.0000 mg | ORAL_TABLET | Freq: Once | ORAL | 1 refills | Status: AC

## 2017-06-03 NOTE — Telephone Encounter (Signed)
Notified pt rx has been sent to [facility]...Abigail Collins

## 2017-06-03 NOTE — Telephone Encounter (Signed)
Pt called and said that she is on antibiotic and needs meds called in for a yeast infection

## 2017-06-03 NOTE — Telephone Encounter (Signed)
RX sent

## 2017-06-22 ENCOUNTER — Ambulatory Visit: Payer: PPO | Admitting: Internal Medicine

## 2017-06-25 ENCOUNTER — Ambulatory Visit: Payer: PPO | Admitting: Internal Medicine

## 2017-07-01 ENCOUNTER — Ambulatory Visit (INDEPENDENT_AMBULATORY_CARE_PROVIDER_SITE_OTHER): Payer: PPO | Admitting: Internal Medicine

## 2017-07-01 ENCOUNTER — Encounter: Payer: Self-pay | Admitting: Internal Medicine

## 2017-07-01 VITALS — BP 130/80 | HR 94 | Temp 97.6°F | Resp 16 | Ht 64.0 in | Wt 223.0 lb

## 2017-07-01 DIAGNOSIS — Z23 Encounter for immunization: Secondary | ICD-10-CM | POA: Diagnosis not present

## 2017-07-01 DIAGNOSIS — E1165 Type 2 diabetes mellitus with hyperglycemia: Secondary | ICD-10-CM | POA: Diagnosis not present

## 2017-07-01 DIAGNOSIS — J301 Allergic rhinitis due to pollen: Secondary | ICD-10-CM | POA: Diagnosis not present

## 2017-07-01 DIAGNOSIS — B373 Candidiasis of vulva and vagina: Secondary | ICD-10-CM | POA: Diagnosis not present

## 2017-07-01 DIAGNOSIS — I10 Essential (primary) hypertension: Secondary | ICD-10-CM

## 2017-07-01 DIAGNOSIS — B3731 Acute candidiasis of vulva and vagina: Secondary | ICD-10-CM | POA: Insufficient documentation

## 2017-07-01 DIAGNOSIS — E1149 Type 2 diabetes mellitus with other diabetic neurological complication: Secondary | ICD-10-CM

## 2017-07-01 DIAGNOSIS — IMO0002 Reserved for concepts with insufficient information to code with codable children: Secondary | ICD-10-CM

## 2017-07-01 MED ORDER — FLUCONAZOLE 150 MG PO TABS: 150.0000 mg | ORAL_TABLET | Freq: Once | ORAL | 3 refills | Status: AC

## 2017-07-01 MED ORDER — SEMAGLUTIDE(0.25 OR 0.5MG/DOS) 2 MG/1.5ML ~~LOC~~ SOPN
0.5000 mg | PEN_INJECTOR | SUBCUTANEOUS | 5 refills | Status: DC
Start: 2017-07-01 — End: 2018-02-02

## 2017-07-01 NOTE — Patient Instructions (Signed)

## 2017-07-01 NOTE — Progress Notes (Signed)
Subjective:  Patient ID: Abigail Collins, female    DOB: [birth year]  Age: 66 y.o. MRN: [id number]  CC: Hypertension and Diabetes   HPI Abigail Collins presents for f/up - She complains of vaginal yeast infections over the last few weeks but tells me that her blood sugars are improving. Her highest BS over the last 2 weeks has been 199 with a few down to 100-103. She also complains of runny nose and postnasal drip.  Outpatient Medications Prior to Visit  Medication Sig Dispense Refill  . albuterol (PROVENTIL) (2.5 MG/3ML) 0.083% nebulizer solution Inhale 2.5 mg into the lungs every 6 (six) hours as needed for wheezing or shortness of breath. Reported on 01/17/2016    . atorvastatin (LIPITOR) 20 MG tablet Take 1 tablet (20 mg total) by mouth daily. 90 tablet 1  . Blood Glucose Monitoring Suppl (ONETOUCH VERIO IQ SYSTEM) W/DEVICE KIT 1 Act by Does not apply route 3 (three) times daily. 2 kit 0  . budesonide-formoterol (SYMBICORT) 80-4.5 MCG/ACT inhaler Inhale 2 puffs into the lungs 2 (two) times daily. 1 Inhaler 11  . chlorthalidone (HYGROTON) 25 MG tablet Take 1 tablet (25 mg total) by mouth daily. 90 tablet 1  . HYDROcodone-acetaminophen (NORCO) 5-325 MG tablet Take 1 tablet by mouth every 6 (six) hours as needed. 65 tablet 0  . Insulin Glargine (TOUJEO SOLOSTAR) 300 UNIT/ML SOPN Inject 50 Units into the skin daily. 1.5 mL 5  . Insulin Pen Needle (NOVOFINE) 32G X 6 MM MISC 1 Act by Does not apply route daily. 100 each 3  . irbesartan (AVAPRO) 300 MG tablet Take 1 tablet (300 mg total) by mouth daily. 90 tablet 1  . metFORMIN (GLUCOPHAGE) 1000 MG tablet Take 1 tablet (1,000 mg total) by mouth 2 (two) times daily with a meal. 180 tablet 1  . omega-3 acid ethyl esters (LOVAZA) 1 g capsule Take 2 capsules (2 g total) by mouth 2 (two) times daily. 360 capsule 1  . ONETOUCH VERIO test strip TEST 3 TIMES A DAY 200 each 11  . canagliflozin (INVOKANA) 100 MG TABS tablet Take 1 tablet (100 mg total)  by mouth daily before breakfast. 90 tablet 1   No facility-administered medications prior to visit.     ROS Review of Systems  Constitutional: Negative.  Negative for appetite change, chills, diaphoresis, fatigue and fever.  HENT: Positive for congestion, postnasal drip and rhinorrhea. Negative for facial swelling, nosebleeds, sinus pain, sinus pressure, sneezing, sore throat and tinnitus.   Eyes: Negative.   Respiratory: Negative.  Negative for cough, chest tightness, shortness of breath and wheezing.   Cardiovascular: Negative for chest pain, palpitations and leg swelling.  Gastrointestinal: Negative for abdominal pain, constipation, diarrhea, nausea and vomiting.  Endocrine: Negative for polydipsia, polyphagia and polyuria.  Genitourinary: Positive for vaginal discharge. Negative for decreased urine volume, difficulty urinating, flank pain, frequency and vaginal bleeding.  Musculoskeletal: Negative.  Negative for myalgias.  Skin: Negative.  Negative for color change and rash.  Allergic/Immunologic: Negative.   Neurological: Negative.  Negative for dizziness, weakness and light-headedness.  Hematological: Negative for adenopathy. Does not bruise/bleed easily.  Psychiatric/Behavioral: Negative.     Objective:  BP 130/80 (BP Location: Left Arm, Patient Position: Sitting, Cuff Size: Normal)   Pulse 94   Temp 97.6 F (36.4 C) (Oral)   Resp 16   Ht 5' 4"  (1.626 m)   Wt 223 lb (101.2 kg)   SpO2 98%   BMI 38.28 kg/m   BP Readings  from Last 3 Encounters:  07/01/17 130/80  06/01/17 118/78  05/26/17 (!) 160/110    Wt Readings from Last 3 Encounters:  07/01/17 223 lb (101.2 kg)  05/26/17 230 lb (104.3 kg)  11/27/16 232 lb 1.9 oz (105.3 kg)    Physical Exam  Constitutional: She is oriented to person, place, and time. No distress.  HENT:  Mouth/Throat: Oropharynx is clear and moist. No oropharyngeal exudate.  Eyes: Conjunctivae are normal. Right eye exhibits no discharge. Left  eye exhibits no discharge. No scleral icterus.  Neck: Normal range of motion. Neck supple. No JVD present. No thyromegaly present.  Cardiovascular: Normal rate, regular rhythm and intact distal pulses.  Exam reveals no gallop and no friction rub.   No murmur heard. Pulmonary/Chest: Effort normal and breath sounds normal. No respiratory distress. She has no wheezes. She has no rales. She exhibits no tenderness.  Abdominal: Soft. Bowel sounds are normal. She exhibits no distension and no mass. There is no tenderness. There is no rebound and no guarding.  Musculoskeletal: Normal range of motion. She exhibits no edema or tenderness.  Lymphadenopathy:    She has no cervical adenopathy.  Neurological: She is alert and oriented to person, place, and time.  Skin: Skin is warm and dry. No rash noted. She is not diaphoretic. No erythema. No pallor.  Vitals reviewed.   Lab Results  Component Value Date   WBC 8.9 05/26/2017   HGB 15.0 05/26/2017   HCT 45.2 05/26/2017   PLT 199.0 05/26/2017   GLUCOSE 172 (H) 05/26/2017   CHOL 174 05/26/2017   TRIG 339.0 (H) 05/26/2017   HDL 51.20 05/26/2017   LDLDIRECT 77.0 05/26/2017   LDLCALC UNABLE TO CALCULATE IF TRIGLYCERIDE OVER 400 mg/dL 04/06/2012   ALT 22 05/26/2017   AST 29 05/26/2017   NA 140 05/26/2017   K 3.8 05/26/2017   CL 104 05/26/2017   CREATININE 0.69 05/26/2017   BUN 15 05/26/2017   CO2 29 05/26/2017   TSH 2.38 05/26/2017   INR 0.88 09/03/2012   HGBA1C 8.7 05/26/2017   MICROALBUR 5.3 (H) 05/26/2017    Dg Elbow Complete Right  Result Date: 10/29/2016 CLINICAL DATA:  Fall at home landing on right elbow. Right elbow pain. EXAM: RIGHT ELBOW - COMPLETE 3+ VIEW COMPARISON:  Preoperative radiographs 11/14/2004 FINDINGS: Comminuted fracture of the distal humerus. Fracture extends to the articular surface with 2 mm distraction. Minimally displaced component involves the medial and lateral humeral epicondyles as well as lateral metaphyseal  cortex. There is an associated joint effusion. Radial head prosthesis in place without periprosthetic fracture. IMPRESSION: Comminuted mildly displaced intra-articular distal humerus fracture. Fracture involves the medial and lateral epicondyles as well as is lateral metaphyseal cortex. Radial head prosthesis in place without periprosthetic fracture. Electronically Signed   By: Jeb Levering M.D.   On: 10/29/2016 05:21   Ct Head Wo Contrast  Result Date: 10/29/2016 CLINICAL DATA:  Trip and fall with head injury. Unknown loss of consciousness. EXAM: CT HEAD WITHOUT CONTRAST TECHNIQUE: Contiguous axial images were obtained from the base of the skull through the vertex without intravenous contrast. COMPARISON:  None. FINDINGS: Brain: No evidence of acute infarction, hemorrhage, hydrocephalus, extra-axial collection or mass lesion/mass effect. Vascular: No hyperdense vessel or unexpected calcification. Skull: Normal. Negative for fracture or focal lesion. Sinuses/Orbits: No acute finding. Other: Left frontal scalp hematoma. IMPRESSION: Left frontal scalp hematoma without acute intracranial abnormality or skull fracture. Electronically Signed   By: Jeb Levering M.D.   On: 10/29/2016  06:48   Dg Knee Complete 4 Views Left  Result Date: 10/29/2016 CLINICAL DATA:  Fall at home with left knee pain. EXAM: LEFT KNEE - COMPLETE 4+ VIEW COMPARISON:  None. FINDINGS: No acute fracture or dislocation. Tricompartmental osteoarthritis with peripheral spurring. Mild lateral tibiofemoral joint space narrowing. Possible small knee joint effusion. IMPRESSION: Tricompartmental osteoarthritis without acute fracture or subluxation. Possible small knee joint effusion. Electronically Signed   By: Jeb Levering M.D.   On: 10/29/2016 06:33    Assessment & Plan:   [place] was seen today for hypertension and diabetes.  Diagnoses and all orders for this visit:  HTN (hypertension), benign- her BP is well  controlled  Diabetes mellitus with neurological manifestations, uncontrolled ([place])- since she has a recurrent vaginal yeast infection she will have to stop taking the SGLT2 inhibitor. I have asked her to start a GLP-1 agonist in addition to the metformin and basal insulin for blood sugar control. -     Semaglutide (OZEMPIC) 0.25 or 0.5 MG/DOSE SOPN; Inject 0.5 mg into the skin once a week.  Yeast vaginitis -     fluconazole (DIFLUCAN) 150 MG tablet; Take 1 tablet (150 mg total) by mouth once.  Need for influenza vaccination -     Flu vaccine HIGH DOSE PF (Fluzone High dose)   I have discontinued Ms. Tabares's canagliflozin. I am also having her start on fluconazole and Semaglutide. Additionally, I am having her maintain her ONETOUCH VERIO IQ SYSTEM, albuterol, budesonide-formoterol, Insulin Pen Needle, ONETOUCH VERIO, chlorthalidone, irbesartan, metFORMIN, Insulin Glargine, HYDROcodone-acetaminophen, omega-3 acid ethyl esters, and atorvastatin.  Meds ordered this encounter  Medications  . fluconazole (DIFLUCAN) 150 MG tablet    Sig: Take 1 tablet (150 mg total) by mouth once.    Dispense:  1 tablet    Refill:  3  . Semaglutide (OZEMPIC) 0.25 or 0.5 MG/DOSE SOPN    Sig: Inject 0.5 mg into the skin once a week.    Dispense:  1.5 mL    Refill:  5     Follow-up: Return in about 3 months (around 09/30/2017).  Scarlette Calico, MD

## 2017-07-02 DIAGNOSIS — Z23 Encounter for immunization: Secondary | ICD-10-CM | POA: Diagnosis not present

## 2017-07-02 DIAGNOSIS — J301 Allergic rhinitis due to pollen: Secondary | ICD-10-CM | POA: Diagnosis not present

## 2017-07-02 DIAGNOSIS — E1165 Type 2 diabetes mellitus with hyperglycemia: Secondary | ICD-10-CM | POA: Diagnosis not present

## 2017-07-02 DIAGNOSIS — B373 Candidiasis of vulva and vagina: Secondary | ICD-10-CM | POA: Diagnosis not present

## 2017-07-02 DIAGNOSIS — I10 Essential (primary) hypertension: Secondary | ICD-10-CM | POA: Diagnosis not present

## 2017-07-02 DIAGNOSIS — E1149 Type 2 diabetes mellitus with other diabetic neurological complication: Secondary | ICD-10-CM | POA: Diagnosis not present

## 2017-07-05 MED ORDER — LEVOCETIRIZINE DIHYDROCHLORIDE 5 MG PO TABS: 5.0000 mg | ORAL_TABLET | Freq: Every evening | ORAL | 3 refills | Status: DC

## 2017-07-27 ENCOUNTER — Ambulatory Visit: Payer: PPO | Admitting: Endocrinology

## 2017-09-23 ENCOUNTER — Telehealth: Payer: Self-pay | Admitting: Internal Medicine

## 2017-09-23 DIAGNOSIS — IMO0002 Reserved for concepts with insufficient information to code with codable children: Secondary | ICD-10-CM

## 2017-09-23 DIAGNOSIS — E1149 Type 2 diabetes mellitus with other diabetic neurological complication: Secondary | ICD-10-CM

## 2017-09-23 DIAGNOSIS — E1165 Type 2 diabetes mellitus with hyperglycemia: Secondary | ICD-10-CM

## 2017-09-23 NOTE — Telephone Encounter (Signed)
Copied from [place] #[id number]. Topic: Quick Communication - See Telephone Encounter >> Sep 23, 2017  1:17 PM Conception Chancy, NT wrote: CRM for notification. See Telephone encounter for:  09/23/17.  Pt states she has contacted her pharmacy and she is needing TouJeo Solostar to be refilled. She uses [facility] that is on file. Thank you

## 2017-09-24 MED ORDER — INSULIN GLARGINE 300 UNIT/ML ~~LOC~~ SOPN
50.0000 [IU] | PEN_INJECTOR | Freq: Every day | SUBCUTANEOUS | 2 refills | Status: DC
Start: 2017-09-24 — End: 2017-09-25

## 2017-09-24 NOTE — Telephone Encounter (Signed)
See attached

## 2017-09-24 NOTE — Telephone Encounter (Signed)
Reviewed chart pt is up-to-date sent refills to [facility].../lmb  

## 2017-09-25 MED ORDER — INSULIN GLARGINE 300 UNIT/ML ~~LOC~~ SOPN: 50.0000 [IU] | PEN_INJECTOR | Freq: Every day | SUBCUTANEOUS | 5 refills | Status: DC

## 2017-09-25 NOTE — Addendum Note (Signed)
Addended by: Aviva Signs M on: 09/25/2017 05:01 PM   Modules accepted: Orders

## 2017-09-30 ENCOUNTER — Ambulatory Visit: Payer: PPO | Admitting: Internal Medicine

## 2017-09-30 ENCOUNTER — Encounter: Payer: Self-pay | Admitting: Internal Medicine

## 2017-09-30 VITALS — BP 134/88 | HR 89 | Temp 97.8°F | Resp 16 | Ht 64.0 in | Wt 218.0 lb

## 2017-09-30 DIAGNOSIS — E1165 Type 2 diabetes mellitus with hyperglycemia: Secondary | ICD-10-CM | POA: Diagnosis not present

## 2017-09-30 DIAGNOSIS — IMO0002 Reserved for concepts with insufficient information to code with codable children: Secondary | ICD-10-CM

## 2017-09-30 DIAGNOSIS — B9689 Other specified bacterial agents as the cause of diseases classified elsewhere: Secondary | ICD-10-CM

## 2017-09-30 DIAGNOSIS — E1149 Type 2 diabetes mellitus with other diabetic neurological complication: Secondary | ICD-10-CM | POA: Diagnosis not present

## 2017-09-30 DIAGNOSIS — N760 Acute vaginitis: Secondary | ICD-10-CM

## 2017-09-30 LAB — HEMOGLOBIN A1C: Hemoglobin A1C: 6.9

## 2017-09-30 NOTE — Patient Instructions (Signed)

## 2017-09-30 NOTE — Progress Notes (Signed)
Subjective:  Patient ID: Abigail Collins, female    DOB: 15-Mar-1951  Age: 66 y.o. MRN: [id number]  CC: Diabetes   HPI ED RAYSON presents for follow-up - since starting Ozempicc she has lost 5 pounds.  She tells me her blood sugars are much better.  She is very pleased with her results.  Today she complains of a several week history of fishy odor from her vagina and in her urine.  She denies dysuria, hematuria, vaginal or pelvic pain, vaginal bleeding.  Outpatient Medications Prior to Visit  Medication Sig Dispense Refill  . albuterol (PROVENTIL) (2.5 MG/3ML) 0.083% nebulizer solution Inhale 2.5 mg into the lungs every 6 (six) hours as needed for wheezing or shortness of breath. Reported on 01/17/2016    . atorvastatin (LIPITOR) 20 MG tablet Take 1 tablet (20 mg total) by mouth daily. 90 tablet 1  . Blood Glucose Monitoring Suppl (ONETOUCH VERIO IQ SYSTEM) W/DEVICE KIT 1 Act by Does not apply route 3 (three) times daily. 2 kit 0  . budesonide-formoterol (SYMBICORT) 80-4.5 MCG/ACT inhaler Inhale 2 puffs into the lungs 2 (two) times daily. 1 Inhaler 11  . chlorthalidone (HYGROTON) 25 MG tablet Take 1 tablet (25 mg total) by mouth daily. 90 tablet 1  . HYDROcodone-acetaminophen (NORCO) 5-325 MG tablet Take 1 tablet by mouth every 6 (six) hours as needed. 65 tablet 0  . Insulin Glargine (TOUJEO SOLOSTAR) 300 UNIT/ML SOPN Inject 50 Units into the skin daily. 4.5 mL 5  . Insulin Pen Needle (NOVOFINE) 32G X 6 MM MISC 1 Act by Does not apply route daily. 100 each 3  . irbesartan (AVAPRO) 300 MG tablet Take 1 tablet (300 mg total) by mouth daily. 90 tablet 1  . levocetirizine (XYZAL) 5 MG tablet Take 1 tablet (5 mg total) by mouth every evening. 90 tablet 3  . metFORMIN (GLUCOPHAGE) 1000 MG tablet Take 1 tablet (1,000 mg total) by mouth 2 (two) times daily with a meal. 180 tablet 1  . omega-3 acid ethyl esters (LOVAZA) 1 g capsule Take 2 capsules (2 g total) by mouth 2 (two) times daily. 360  capsule 1  . ONETOUCH VERIO test strip TEST 3 TIMES A DAY 200 each 11  . Semaglutide (OZEMPIC) 0.25 or 0.5 MG/DOSE SOPN Inject 0.5 mg into the skin once a week. 1.5 mL 5   No facility-administered medications prior to visit.     ROS Review of Systems  Constitutional: Negative for diaphoresis, fatigue and unexpected weight change.  HENT: Negative.   Eyes: Negative.  Negative for visual disturbance.  Respiratory: Negative for cough, chest tightness, shortness of breath and wheezing.   Cardiovascular: Negative for chest pain, palpitations and leg swelling.  Gastrointestinal: Negative for abdominal pain, constipation, nausea and vomiting.  Endocrine: Negative for polydipsia, polyphagia and polyuria.  Genitourinary: Positive for vaginal discharge. Negative for difficulty urinating, dysuria, flank pain, frequency, hematuria, urgency, vaginal bleeding and vaginal pain.  Musculoskeletal: Positive for arthralgias. Negative for myalgias.  Skin: Negative.   Allergic/Immunologic: Negative.   Neurological: Negative.   Hematological: Negative for adenopathy. Does not bruise/bleed easily.  Psychiatric/Behavioral: Negative.     Objective:  BP 134/88 (BP Location: Left Arm, Patient Position: Sitting, Cuff Size: Normal)   Pulse 89   Temp 97.8 F (36.6 C) (Oral)   Resp 16   Ht 5' 4"  (1.626 m)   Wt 218 lb (98.9 kg)   SpO2 96%   BMI 37.42 kg/m   BP Readings from Last 3 Encounters:  09/30/17 134/88  07/01/17 130/80  06/01/17 118/78    Wt Readings from Last 3 Encounters:  09/30/17 218 lb (98.9 kg)  07/01/17 223 lb (101.2 kg)  05/26/17 230 lb (104.3 kg)    Physical Exam  Constitutional: She is oriented to person, place, and time. No distress.  HENT:  Mouth/Throat: Oropharynx is clear and moist. No oropharyngeal exudate.  Eyes: Conjunctivae are normal. Left eye exhibits no discharge.  Neck: Normal range of motion. Neck supple. No JVD present. No thyromegaly present.  Cardiovascular:  Normal rate, regular rhythm and normal heart sounds.  No murmur heard. Pulmonary/Chest: Effort normal and breath sounds normal. No respiratory distress. She has no rales.  Abdominal: Bowel sounds are normal. She exhibits no mass. There is no tenderness. There is no guarding.  Musculoskeletal: Normal range of motion. She exhibits no edema or tenderness.  Lymphadenopathy:    She has no cervical adenopathy.  Neurological: She is alert and oriented to person, place, and time.  Skin: Skin is warm and dry. No rash noted. She is not diaphoretic. No erythema. No pallor.  Vitals reviewed.   Lab Results  Component Value Date   WBC 8.9 05/26/2017   HGB 15.0 05/26/2017   HCT 45.2 05/26/2017   PLT 199.0 05/26/2017   GLUCOSE 172 (H) 05/26/2017   CHOL 174 05/26/2017   TRIG 339.0 (H) 05/26/2017   HDL 51.20 05/26/2017   LDLDIRECT 77.0 05/26/2017   LDLCALC UNABLE TO CALCULATE IF TRIGLYCERIDE OVER 400 mg/dL 04/06/2012   ALT 22 05/26/2017   AST 29 05/26/2017   NA 140 05/26/2017   K 3.8 05/26/2017   CL 104 05/26/2017   CREATININE 0.69 05/26/2017   BUN 15 05/26/2017   CO2 29 05/26/2017   TSH 2.38 05/26/2017   INR 0.88 09/03/2012   HGBA1C 6.9 09/30/2017   MICROALBUR 5.3 (H) 05/26/2017    Dg Elbow Complete Right  Result Date: 10/29/2016 CLINICAL DATA:  Fall at home landing on right elbow. Right elbow pain. EXAM: RIGHT ELBOW - COMPLETE 3+ VIEW COMPARISON:  Preoperative radiographs 11/14/2004 FINDINGS: Comminuted fracture of the distal humerus. Fracture extends to the articular surface with 2 mm distraction. Minimally displaced component involves the medial and lateral humeral epicondyles as well as lateral metaphyseal cortex. There is an associated joint effusion. Radial head prosthesis in place without periprosthetic fracture. IMPRESSION: Comminuted mildly displaced intra-articular distal humerus fracture. Fracture involves the medial and lateral epicondyles as well as is lateral metaphyseal cortex.  Radial head prosthesis in place without periprosthetic fracture. Electronically Signed   By: Jeb Levering M.D.   On: 10/29/2016 05:21   Ct Head Wo Contrast  Result Date: 10/29/2016 CLINICAL DATA:  Trip and fall with head injury. Unknown loss of consciousness. EXAM: CT HEAD WITHOUT CONTRAST TECHNIQUE: Contiguous axial images were obtained from the base of the skull through the vertex without intravenous contrast. COMPARISON:  None. FINDINGS: Brain: No evidence of acute infarction, hemorrhage, hydrocephalus, extra-axial collection or mass lesion/mass effect. Vascular: No hyperdense vessel or unexpected calcification. Skull: Normal. Negative for fracture or focal lesion. Sinuses/Orbits: No acute finding. Other: Left frontal scalp hematoma. IMPRESSION: Left frontal scalp hematoma without acute intracranial abnormality or skull fracture. Electronically Signed   By: Jeb Levering M.D.   On: 10/29/2016 06:48   Dg Knee Complete 4 Views Left  Result Date: 10/29/2016 CLINICAL DATA:  Fall at home with left knee pain. EXAM: LEFT KNEE - COMPLETE 4+ VIEW COMPARISON:  None. FINDINGS: No acute fracture or dislocation. Tricompartmental osteoarthritis  with peripheral spurring. Mild lateral tibiofemoral joint space narrowing. Possible small knee joint effusion. IMPRESSION: Tricompartmental osteoarthritis without acute fracture or subluxation. Possible small knee joint effusion. Electronically Signed   By: Jeb Levering M.D.   On: 10/29/2016 06:33    Assessment & Plan:   Kristiana was seen today for diabetes.  Diagnoses and all orders for this visit:  Diabetes mellitus with neurological manifestations, uncontrolled ([place])- her A1c is down to 6.9%.  Marked improvement noted.  Will continue the GLP-1 agonist, basal insulin, and metformin. -     POCT glycosylated hemoglobin (Hb A1C) -     Ambulatory referral to Ophthalmology  BV (bacterial vaginosis)- she was not willing to undergo a GYN exam today.  Will  empirically treat with metronidazole. -     metroNIDAZOLE (FLAGYL) 500 MG tablet; Take 1 tablet (500 mg total) by mouth 2 (two) times daily for 7 days.   I am having Abigail Collins start on metroNIDAZOLE. I am also having her maintain her ONETOUCH VERIO IQ SYSTEM, albuterol, budesonide-formoterol, Insulin Pen Needle, ONETOUCH VERIO, chlorthalidone, irbesartan, metFORMIN, HYDROcodone-acetaminophen, omega-3 acid ethyl esters, atorvastatin, Semaglutide, levocetirizine, and Insulin Glargine.  Meds ordered this encounter  Medications  . metroNIDAZOLE (FLAGYL) 500 MG tablet    Sig: Take 1 tablet (500 mg total) by mouth 2 (two) times daily for 7 days.    Dispense:  14 tablet    Refill:  1     Follow-up: Return in about 6 months (around 03/31/2018).  Scarlette Calico, MD

## 2017-10-01 ENCOUNTER — Telehealth: Payer: Self-pay | Admitting: Internal Medicine

## 2017-10-01 DIAGNOSIS — N760 Acute vaginitis: Secondary | ICD-10-CM

## 2017-10-01 DIAGNOSIS — B9689 Other specified bacterial agents as the cause of diseases classified elsewhere: Secondary | ICD-10-CM | POA: Insufficient documentation

## 2017-10-01 MED ORDER — METRONIDAZOLE 500 MG PO TABS: 500.0000 mg | ORAL_TABLET | Freq: Two times a day (BID) | ORAL | 1 refills | Status: AC

## 2017-10-01 NOTE — Telephone Encounter (Signed)
I have sent an RX to her pharmacy to treat this

## 2017-10-01 NOTE — Telephone Encounter (Signed)
Pt informed rx was sent.  

## 2017-10-01 NOTE — Telephone Encounter (Signed)
Copied from [place] [id number]. Topic: General - Other >> Oct 01, 2017  9:12 AM Carolyn Stare wrote:  Pt said she is taking fish oil pills and her bottom and urine always smell like fish and is asking if there is something she can take to get rid of the odor. Would like a call back   [phone]

## 2017-10-05 LAB — POCT GLYCOSYLATED HEMOGLOBIN (HGB A1C): Hemoglobin A1C: 6.9

## 2017-10-17 ENCOUNTER — Ambulatory Visit (INDEPENDENT_AMBULATORY_CARE_PROVIDER_SITE_OTHER): Payer: PPO | Admitting: Family

## 2017-10-17 ENCOUNTER — Encounter: Payer: Self-pay | Admitting: Family

## 2017-10-17 VITALS — BP 140/100 | HR 92 | Temp 98.0°F | Wt 218.0 lb

## 2017-10-17 DIAGNOSIS — L03011 Cellulitis of right finger: Secondary | ICD-10-CM

## 2017-10-17 MED ORDER — SULFAMETHOXAZOLE-TRIMETHOPRIM 800-160 MG PO TABS: 1.0000 | ORAL_TABLET | Freq: Two times a day (BID) | ORAL | 0 refills | Status: DC

## 2017-10-17 MED ORDER — FLUCONAZOLE 150 MG PO TABS: 150.0000 mg | ORAL_TABLET | Freq: Once | ORAL | 0 refills | Status: AC

## 2017-10-17 NOTE — Progress Notes (Signed)
Abigail Collins is a 67 y.o. female with the following history as recorded in EpicCare:  Patient Active Problem List   Diagnosis Date Noted  . BV (bacterial vaginosis) 10/01/2017  . Yeast vaginitis 07/01/2017  . Excessive daytime sleepiness 05/27/2016  . COPD mixed type ([place]) 01/09/2016  . Visit for screening mammogram 03/28/2015  . Colon cancer screening 03/28/2015  . Depression with somatization 03/28/2015  . Insomnia 03/28/2015  . Primary osteoarthritis of both knees 03/28/2015  . Allergic rhinitis 03/15/2014  . Hyperlipidemia with target LDL less than 100 12/22/2013  . Routine general medical examination at a health care facility 08/03/2013  . Lumbar compression fracture ([place]) 11/01/2012  . Pure hyperglyceridemia 08/05/2012  . Gastroparesis due to DM ([place]) 08/05/2012  . Other screening mammogram 08/05/2012  . Diabetes mellitus with neurological manifestations, uncontrolled ([place]) 04/05/2012  . HTN (hypertension), benign 04/05/2012  . GERD (gastroesophageal reflux disease) 04/05/2012  . Morbid obesity ([place]) 04/05/2012    Current Outpatient Medications  Medication Sig Dispense Refill  . albuterol (PROVENTIL) (2.5 MG/3ML) 0.083% nebulizer solution Inhale 2.5 mg into the lungs every 6 (six) hours as needed for wheezing or shortness of breath. Reported on 01/17/2016    . atorvastatin (LIPITOR) 20 MG tablet Take 1 tablet (20 mg total) by mouth daily. 90 tablet 1  . Blood Glucose Monitoring Suppl (ONETOUCH VERIO IQ SYSTEM) W/DEVICE KIT 1 Act by Does not apply route 3 (three) times daily. 2 kit 0  . budesonide-formoterol (SYMBICORT) 80-4.5 MCG/ACT inhaler Inhale 2 puffs into the lungs 2 (two) times daily. 1 Inhaler 11  . chlorthalidone (HYGROTON) 25 MG tablet Take 1 tablet (25 mg total) by mouth daily. 90 tablet 1  . HYDROcodone-acetaminophen (NORCO) 5-325 MG tablet Take 1 tablet by mouth every 6 (six) hours as needed. 65 tablet 0  . Insulin Glargine (TOUJEO SOLOSTAR) 300 UNIT/ML SOPN  Inject 50 Units into the skin daily. 4.5 mL 5  . Insulin Pen Needle (NOVOFINE) 32G X 6 MM MISC 1 Act by Does not apply route daily. 100 each 3  . irbesartan (AVAPRO) 300 MG tablet Take 1 tablet (300 mg total) by mouth daily. 90 tablet 1  . levocetirizine (XYZAL) 5 MG tablet Take 1 tablet (5 mg total) by mouth every evening. 90 tablet 3  . metFORMIN (GLUCOPHAGE) 1000 MG tablet Take 1 tablet (1,000 mg total) by mouth 2 (two) times daily with a meal. 180 tablet 1  . omega-3 acid ethyl esters (LOVAZA) 1 g capsule Take 2 capsules (2 g total) by mouth 2 (two) times daily. 360 capsule 1  . ONETOUCH VERIO test strip TEST 3 TIMES A DAY 200 each 11  . Semaglutide (OZEMPIC) 0.25 or 0.5 MG/DOSE SOPN Inject 0.5 mg into the skin once a week. 1.5 mL 5  . fluconazole (DIFLUCAN) 150 MG tablet Take 1 tablet (150 mg total) by mouth once for 1 dose. 1 tablet 0  . sulfamethoxazole-trimethoprim (BACTRIM DS,SEPTRA DS) 800-160 MG tablet Take 1 tablet by mouth 2 (two) times daily. 20 tablet 0   No current facility-administered medications for this visit.     Allergies: Invokana [canagliflozin]; Lisinopril; Amlodipine; Tape; and Percocet [oxycodone-acetaminophen]  Past Medical History:  Diagnosis Date  . Anemia    hx of  . Anginal pain ([place])    "left arm pain, sees Dr. Etter Sjogren, had card cath 2013"  . Arthritis    "all over" (09/14/2012)  . Asthma    takes inhaler 2x day  . Bronchitis  hx of  . Bulging disc    "lower"  . Carpal tunnel syndrome of left wrist   . Depression    denies  . Exertional dyspnea    "sometimes laying down" (09/14/2012)  . Hyperlipidemia   . Hypertension    sees Dr. Debby Freiberg, primary  . Pneumonia 03/2012  . PONV (postoperative nausea and vomiting)   . Thyroid disease 1960's   "don't have it now" (09/14/2012)  . Type II diabetes mellitus ([place])   . Urinary tract infection    hx of  . Vomiting    pt states she vomits every am    Past Surgical History:  Procedure Laterality  Date  . CARDIAC CATHETERIZATION  05/13/2012   mod luminal irregularity of pLAD, no sign CAD, EF 65%.  . CHOLECYSTECTOMY  1970's  . KYPHOPLASTY N/A 02/10/2013   Procedure:  LUMBAR TWO KYPHOPLASTY;  Surgeon: Ophelia Charter, MD;  Location: [facility];  Service: Neurosurgery;  Laterality: N/A;  L2 Kyphoplasty; Will use Stern's Carm.  Marland Kitchen TOTAL ELBOW REPLACEMENT  ~ 2005   "right" (09/14/2012)  . TOTAL KNEE ARTHROPLASTY  09/13/2012   Procedure: TOTAL KNEE ARTHROPLASTY;  Surgeon: Vickey Huger, MD;  Location: [facility];  Service: Orthopedics;  Laterality: Right;  . TUBAL LIGATION  1970's  . VAGINAL HYSTERECTOMY  1970's    Family History  Problem Relation Age of Onset  . Arthritis Mother   . Arthritis Father   . Hypertension Father   . Diabetes Father   . Colon cancer Neg Hx     Social History   Tobacco Use  . Smoking status: Former Smoker    Packs/day: 0.25    Years: 20.00    Pack years: 5.00    Types: Cigarettes    Last attempt to quit: 10/13/2009    Years since quitting: 8.0  . Smokeless tobacco: Never Used  Substance Use Topics  . Alcohol use: No    Comment: 09/14/2012 "did drink a little in my younger days"    Subjective:  4 day history of "red knot" on right hand-  Notes area just "popped up" on the joint; no known injury; second area developed on 5th finger right hand; no fevers; no difficulty moving hand; notes that pain is better today than yesterday; put some alcohol on the area/ applied heat;  Notes that she has not taken her blood pressure medication this am;    Objective:  Vitals:   10/17/17 0904 10/17/17 0925  BP: (!) 164/100 (!) 140/100  Pulse: 92   Temp: 98 F (36.7 C)   TempSrc: Oral   Weight: 218 lb (98.9 kg)     General: Well developed, well nourished, in no acute distress  Skin : Warm and dry. Raised, red area over distal joint 4th finger right hand; small area of swelling on underside of 5th finger right hand Head: Normocephalic and atraumatic  Lungs:  Respirations unlabored; clear to auscultation bilaterally without wheeze, rales, rhonchi  Neurologic: Alert and oriented; speech intact; face symmetrical; moves all extremities well; CNII-XII intact without focal deficit  Assessment:  1. Cellulitis of finger of right hand     Plan:  Patient is seen on a Saturday and unable to get any imaging or labs; will start Bactrim DS bid x 10 days; follow-up if symptoms persist, worsen;  Encouraged to take her blood pressure medication; congratulated patient on weight loss and diabetes control.   No Follow-up on file.  No orders of the defined types  were placed in this encounter.   Requested Prescriptions   Signed Prescriptions Disp Refills  . sulfamethoxazole-trimethoprim (BACTRIM DS,SEPTRA DS) 800-160 MG tablet 20 tablet 0    Sig: Take 1 tablet by mouth 2 (two) times daily.  . fluconazole (DIFLUCAN) 150 MG tablet 1 tablet 0    Sig: Take 1 tablet (150 mg total) by mouth once for 1 dose.

## 2017-11-26 ENCOUNTER — Encounter: Payer: Self-pay | Admitting: Family Medicine

## 2017-11-26 ENCOUNTER — Ambulatory Visit (INDEPENDENT_AMBULATORY_CARE_PROVIDER_SITE_OTHER): Payer: PPO | Admitting: Family Medicine

## 2017-11-26 VITALS — BP 142/78 | HR 78 | Temp 97.9°F | Ht 64.0 in | Wt 216.0 lb

## 2017-11-26 DIAGNOSIS — J069 Acute upper respiratory infection, unspecified: Secondary | ICD-10-CM | POA: Insufficient documentation

## 2017-11-26 NOTE — Progress Notes (Signed)
Abigail Collins - 67 y.o. female MRN [id number]  Date of birth: Jun 02, 1951  SUBJECTIVE:  Including CC & ROS.  Chief Complaint  Patient presents with  . Cough    Abigail Collins is a 67 y.o. female that is presenting with cough and congestion. Ongoing for one week. She has been taking mucincex with no improvement. Her husband had similar symptoms last week. Admits to fevers and body aches. She does get the flu shot.   Review of Systems  HENT: Positive for sore throat.   Respiratory: Positive for cough.   Cardiovascular: Negative for chest pain.  Gastrointestinal: Negative for abdominal pain.  Hematological: Negative for adenopathy.    HISTORY: Past Medical, Surgical, Social, and Family History Reviewed & Updated per EMR.   Pertinent Historical Findings include:  Past Medical History:  Diagnosis Date  . Anemia    hx of  . Anginal pain ([place])    "left arm pain, sees Dr. Etter Sjogren, had card cath 2013"  . Arthritis    "all over" (09/14/2012)  . Asthma    takes inhaler 2x day  . Bronchitis    hx of  . Bulging disc    "lower"  . Carpal tunnel syndrome of left wrist   . Depression    denies  . Exertional dyspnea    "sometimes laying down" (09/14/2012)  . Hyperlipidemia   . Hypertension    sees Dr. Debby Freiberg, primary  . Pneumonia 03/2012  . PONV (postoperative nausea and vomiting)   . Thyroid disease 1960's   "don't have it now" (09/14/2012)  . Type II diabetes mellitus ([place])   . Urinary tract infection    hx of  . Vomiting    pt states she vomits every am    Past Surgical History:  Procedure Laterality Date  . CARDIAC CATHETERIZATION  05/13/2012   mod luminal irregularity of pLAD, no sign CAD, EF 65%.  . CHOLECYSTECTOMY  1970's  . KYPHOPLASTY N/A 02/10/2013   Procedure:  LUMBAR TWO KYPHOPLASTY;  Surgeon: Ophelia Charter, MD;  Location: [facility];  Service: Neurosurgery;  Laterality: N/A;  L2 Kyphoplasty; Will use Stern's Carm.  Marland Kitchen TOTAL ELBOW REPLACEMENT  ~ 2005     "right" (09/14/2012)  . TOTAL KNEE ARTHROPLASTY  09/13/2012   Procedure: TOTAL KNEE ARTHROPLASTY;  Surgeon: Vickey Huger, MD;  Location: [facility];  Service: Orthopedics;  Laterality: Right;  . TUBAL LIGATION  1970's  . VAGINAL HYSTERECTOMY  1970's    Allergies  Allergen Reactions  . Invokana [Canagliflozin] Other (See Comments)    Yeast infections  . Lisinopril Shortness Of Breath  . Amlodipine     edema  . Tape Other (See Comments)    Tore skin--?hypofix  . Percocet [Oxycodone-Acetaminophen] Nausea And Vomiting    Family History  Problem Relation Age of Onset  . Arthritis Mother   . Arthritis Father   . Hypertension Father   . Diabetes Father   . Colon cancer Neg Hx      Social History   Socioeconomic History  . Marital status: Married    Spouse name: Not on file  . Number of children: Not on file  . Years of education: Not on file  . Highest education level: Not on file  Social Needs  . Financial resource strain: Not on file  . Food insecurity - worry: Not on file  . Food insecurity - inability: Not on file  . Transportation needs - medical: Not on file  .  Transportation needs - non-medical: Not on file  Occupational History  . Not on file  Tobacco Use  . Smoking status: Former Smoker    Packs/day: 0.25    Years: 20.00    Pack years: 5.00    Types: Cigarettes    Last attempt to quit: 10/13/2009    Years since quitting: 8.1  . Smokeless tobacco: Never Used  Substance and Sexual Activity  . Alcohol use: No    Comment: 09/14/2012 "did drink a little in my younger days"  . Drug use: No  . Sexual activity: Not Currently  Other Topics Concern  . Not on file  Social History Narrative  . Not on file     PHYSICAL EXAM:  VS: BP (!) 142/78 (BP Location: Left Arm, Patient Position: Sitting, Cuff Size: Normal)   Pulse 78   Temp 97.9 F (36.6 C) (Oral)   Ht 5\' 4"  (1.626 m)   Wt 216 lb (98 kg)   SpO2 96%   BMI 37.08 kg/m  Physical Exam Gen: NAD, alert,  cooperative with exam,  ENT: normal lips, normal nasal mucosa, tympanic membranes clear and intact bilaterally, normal oropharynx, no cervical lymphadenopathy Eye: normal EOM, normal conjunctiva and lids CV:  no edema, +2 pedal pulses, regular rate and rhythm, S1-S2   Resp: no accessory muscle use, non-labored, clear to auscultation bilaterally, no crackles or wheezes Skin: no rashes, no areas of induration  Neuro: normal tone, normal sensation to touch Psych:  normal insight, alert and oriented MSK: Normal gait, normal strength       ASSESSMENT & PLAN:   Upper respiratory tract infection Symptoms likely viral in nature - Counseled on supportive care - Given indications to follow-up

## 2017-11-26 NOTE — Assessment & Plan Note (Signed)
Symptoms likely viral in nature - Counseled on supportive care - Given indications to follow-up

## 2017-11-26 NOTE — Patient Instructions (Signed)
Please try things such as zyrtec-D or allegra-D which is an antihistamine and decongestant.   Please try afrin which will help with nasal congestion but use for only three days.   Please also try using a netti pot on a regular occasion.  Honey can help with a sore throat.   Delsym and Vick's can help with cough

## 2017-11-27 ENCOUNTER — Ambulatory Visit: Payer: Self-pay | Admitting: *Deleted

## 2017-11-27 NOTE — Telephone Encounter (Signed)
1430: Pt reports headache "better."  4-5/10 after taking Aleve, rest, ice.  States will continue to follow home advise; will call back if symptoms worsen.

## 2017-11-27 NOTE — Telephone Encounter (Signed)
Pt reports 10/10 headache. States has had headache x 3 days, worsening last night. Headache is "across top of head, across eyes to nose."  Seen in office yesterday by Dr. Raeford Razor for URI; states is following his directions; taking Zyrtec, staying hydrated. Unsure of fever but "don't think I do."  States coughing less, occasional greenish mucous. States overall feels better except for headache. "Vomited a little foam last night." Denies dizziness. Pt has not taken anything for headache. Home care advise given; instructed to take 2 Aleve then per package. Will F/U will call to ensure Aleve and home care advise effective Reason for Disposition . [1] Headache AND [2] part of viral illness AND [3] present < 72 hours  Answer Assessment - Initial Assessment Questions 1. LOCATION: "Where does it hurt?"     Across the top of head, under eyes, across nose 2. ONSET: "When did the headache start?" (Minutes, hours or days)      Started 3 days ago, worsening 3. PATTERN: "Does the pain come and go, or has it been constant since it started?"     Constant 4. SEVERITY: "How bad is the pain?" and "What does it keep you from doing?"  (e.g., Scale 1-10; mild, moderate, or severe)   - MILD (1-3): doesn't interfere with normal activities    - MODERATE (4-7): interferes with normal activities or awakens from sleep    - SEVERE (8-10): excruciating pain, unable to do any normal activities        10/10 5. RECURRENT SYMPTOM: "Have you ever had headaches before?" If so, ask: "When was the last time?" and "What happened that time?"      Last year. "Not sure what was causing headaches." 6. CAUSE: "What do you think is causing the headache?"      7. MIGRAINE: "Have you been diagnosed with migraine headaches?" If so, ask: "Is this headache similar?"      No 8. HEAD INJURY: "Has there been any recent injury to the head?"      No 9. OTHER SYMPTOMS: "Do you have any other symptoms?" (fever, stiff neck, eye pain, sore throat,  cold symptoms)     Cold symptoms, productive cough, greenish, headache, eye pain, mild facial tenderness. Headache, nausea, "threw up a little foam last night."  Protocols used: HEADACHE-A-AH

## 2017-12-01 ENCOUNTER — Telehealth: Payer: Self-pay | Admitting: Internal Medicine

## 2017-12-01 DIAGNOSIS — IMO0002 Reserved for concepts with insufficient information to code with codable children: Secondary | ICD-10-CM

## 2017-12-01 DIAGNOSIS — E785 Hyperlipidemia, unspecified: Secondary | ICD-10-CM

## 2017-12-01 DIAGNOSIS — E1165 Type 2 diabetes mellitus with hyperglycemia: Secondary | ICD-10-CM

## 2017-12-01 DIAGNOSIS — I10 Essential (primary) hypertension: Secondary | ICD-10-CM

## 2017-12-01 DIAGNOSIS — E1149 Type 2 diabetes mellitus with other diabetic neurological complication: Secondary | ICD-10-CM

## 2017-12-01 MED ORDER — ATORVASTATIN CALCIUM 20 MG PO TABS: 20.0000 mg | ORAL_TABLET | Freq: Every day | ORAL | 1 refills | Status: DC

## 2017-12-01 MED ORDER — IRBESARTAN 300 MG PO TABS: 300.0000 mg | ORAL_TABLET | Freq: Every day | ORAL | 1 refills | Status: DC

## 2017-12-01 MED ORDER — METFORMIN HCL 1000 MG PO TABS: 1000.0000 mg | ORAL_TABLET | Freq: Two times a day (BID) | ORAL | 1 refills | Status: DC

## 2017-12-01 NOTE — Telephone Encounter (Signed)
Irbesartan refill Last OV: 09/30/17 Last Refill:05/26/17 #90 tab 1 RF Pharmacy:[facility] [address]  Metformin  refill Last OV: 09/30/17 Last Refill:05/26/17 #180 1RF Pharmacy:[facility] [id number]

## 2017-12-01 NOTE — Telephone Encounter (Signed)
Reviewed chart pt is up-to-date sent refills to [facility].../lmb  

## 2017-12-01 NOTE — Addendum Note (Signed)
Addended by: Earnstine Regal on: 12/01/2017 01:39 PM   Modules accepted: Orders

## 2017-12-01 NOTE — Telephone Encounter (Signed)
Copied from [place]. Topic: Inquiry >> Dec 01, 2017 12:53 PM Abigail Collins wrote: Reason for CRM: Patient called requesting refills for Irbesartan (AVAPRO) 300 MG tablet and MetFORMIN (GLUCOPHAGE) 1000 MG tablet. Patient did contact pharmacy, and they requested her contact our office. [facility]'s [facility] is [facility] [address] [place], [place] [address] [phone] (Phone) [phone] (Fax).

## 2017-12-01 NOTE — Telephone Encounter (Signed)
Copied from [place]. Topic: Inquiry >> Dec 01, 2017 12:53 PM Pricilla Handler wrote: Reason for CRM: Patient called requesting refills for Irbesartan (AVAPRO) 300 MG tablet and MetFORMIN (GLUCOPHAGE) 1000 MG tablet. Patient did contact pharmacy, and they requested her contact our office. Patient's preferred pharmacy is   [facility] [id number] [place], [place] [address] [phone] (Phone) [phone] (Fax).

## 2017-12-27 ENCOUNTER — Other Ambulatory Visit: Payer: Self-pay

## 2017-12-27 ENCOUNTER — Emergency Department (HOSPITAL_COMMUNITY)
Admission: EM | Admit: 2017-12-27 | Discharge: 2017-12-27 | Discharge status: Against Medical Advice | Payer: PPO | Source: Home / Self Care

## 2017-12-27 ENCOUNTER — Emergency Department (HOSPITAL_COMMUNITY)
Admission: EM | Admit: 2017-12-27 | Discharge: 2017-12-27 | Disposition: A | Discharge status: Home / Self Care | Payer: PPO | Attending: Emergency Medicine | Admitting: Emergency Medicine

## 2017-12-27 ENCOUNTER — Encounter (HOSPITAL_COMMUNITY): Payer: Self-pay | Admitting: Emergency Medicine

## 2017-12-27 ENCOUNTER — Emergency Department (HOSPITAL_COMMUNITY): Payer: PPO

## 2017-12-27 DIAGNOSIS — W0110XA Fall on same level from slipping, tripping and stumbling with subsequent striking against unspecified object, initial encounter: Secondary | ICD-10-CM | POA: Diagnosis not present

## 2017-12-27 DIAGNOSIS — J45909 Unspecified asthma, uncomplicated: Secondary | ICD-10-CM | POA: Diagnosis not present

## 2017-12-27 DIAGNOSIS — Y998 Other external cause status: Secondary | ICD-10-CM | POA: Insufficient documentation

## 2017-12-27 DIAGNOSIS — S42211A Unspecified displaced fracture of surgical neck of right humerus, initial encounter for closed fracture: Secondary | ICD-10-CM | POA: Insufficient documentation

## 2017-12-27 DIAGNOSIS — J449 Chronic obstructive pulmonary disease, unspecified: Secondary | ICD-10-CM | POA: Insufficient documentation

## 2017-12-27 DIAGNOSIS — E1149 Type 2 diabetes mellitus with other diabetic neurological complication: Secondary | ICD-10-CM | POA: Insufficient documentation

## 2017-12-27 DIAGNOSIS — Z96621 Presence of right artificial elbow joint: Secondary | ICD-10-CM | POA: Diagnosis not present

## 2017-12-27 DIAGNOSIS — Y92007 Garden or yard of unspecified non-institutional (private) residence as the place of occurrence of the external cause: Secondary | ICD-10-CM | POA: Insufficient documentation

## 2017-12-27 DIAGNOSIS — Z96651 Presence of right artificial knee joint: Secondary | ICD-10-CM | POA: Diagnosis not present

## 2017-12-27 DIAGNOSIS — I10 Essential (primary) hypertension: Secondary | ICD-10-CM | POA: Diagnosis not present

## 2017-12-27 DIAGNOSIS — Y93H2 Activity, gardening and landscaping: Secondary | ICD-10-CM | POA: Diagnosis not present

## 2017-12-27 DIAGNOSIS — S4991XA Unspecified injury of right shoulder and upper arm, initial encounter: Secondary | ICD-10-CM | POA: Diagnosis present

## 2017-12-27 DIAGNOSIS — Z87891 Personal history of nicotine dependence: Secondary | ICD-10-CM | POA: Insufficient documentation

## 2017-12-27 DIAGNOSIS — S42291A Other displaced fracture of upper end of right humerus, initial encounter for closed fracture: Secondary | ICD-10-CM | POA: Diagnosis not present

## 2017-12-27 MED ORDER — HYDROCODONE-ACETAMINOPHEN 5-325 MG PO TABS: 1.0000 | ORAL_TABLET | Freq: Four times a day (QID) | ORAL | 0 refills | Status: DC | PRN

## 2017-12-27 MED ORDER — HYDROCODONE-ACETAMINOPHEN 5-325 MG PO TABS
1.0000 | ORAL_TABLET | Freq: Once | ORAL | Status: AC
  Administered 2017-12-27: 1 via ORAL
  Filled 2017-12-27: qty 1

## 2017-12-27 NOTE — ED Triage Notes (Signed)
Pt states she was outside trying to do yard work and tripped and fell.  C/o pain to R shoulder and R upper arm.  Denies LOC.  Denies neck and back pain.

## 2017-12-27 NOTE — Progress Notes (Signed)
Orthopedic Tech Progress Note Patient Details:  Abigail Collins 22-Jan-1951 [id number]  Ortho Devices Type of Ortho Device: Sling immobilizer Ortho Device/Splint Interventions: Application   Post Interventions Patient Tolerated: Well Instructions Provided: Care of device   [place] Pink 12/27/2017, 6:52 PM

## 2017-12-27 NOTE — ED Notes (Signed)
D/c reviewed with patient and spouse-with teachback

## 2017-12-27 NOTE — ED Notes (Signed)
Ice applied to R shoulder

## 2017-12-27 NOTE — ED Notes (Signed)
Pt left prior to triage after hearing the wait time. She turned her stickers in to registration.

## 2017-12-27 NOTE — ED Notes (Signed)
Ring on R hand removed by husband.

## 2017-12-27 NOTE — ED Provider Notes (Signed)
Emergency Department Provider Note   I have reviewed the triage vital signs and the nursing notes.   HISTORY  Chief Complaint Fall   HPI Abigail Collins is a 67 y.o. female medical history as documented below the presents to the emergency department today secondary to mechanical fall with right-sided arm pain.  She states that she was helping her husband in the yard and tripped over and edging around her garden and fell forward landing on her right shoulder causing instant pain to the area.  Pain is worse with range of motion.  She also has a little bit of right rib pain but no other injuries.  Did not hit her head.  Has had nothing for the symptoms. No other associated or modifying symptoms.    Past Medical History:  Diagnosis Date  . Anemia    hx of  . Anginal pain ([place])    "left arm pain, sees Dr. Etter Sjogren, had card cath 2013"  . Arthritis    "all over" (09/14/2012)  . Asthma    takes inhaler 2x day  . Bronchitis    hx of  . Bulging disc    "lower"  . Carpal tunnel syndrome of left wrist   . Depression    denies  . Exertional dyspnea    "sometimes laying down" (09/14/2012)  . Hyperlipidemia   . Hypertension    sees Dr. Debby Freiberg, primary  . Pneumonia 03/2012  . PONV (postoperative nausea and vomiting)   . Thyroid disease 1960's   "don't have it now" (09/14/2012)  . Type II diabetes mellitus ([place])   . Urinary tract infection    hx of  . Vomiting    pt states she vomits every am    Patient Active Problem List   Diagnosis Date Noted  . Upper respiratory tract infection 11/26/2017  . BV (bacterial vaginosis) 10/01/2017  . Yeast vaginitis 07/01/2017  . Excessive daytime sleepiness 05/27/2016  . COPD mixed type ([place]) 01/09/2016  . Visit for screening mammogram 03/28/2015  . Colon cancer screening 03/28/2015  . Depression with somatization 03/28/2015  . Insomnia 03/28/2015  . Primary osteoarthritis of both knees 03/28/2015  . Allergic rhinitis 03/15/2014    . Hyperlipidemia with target LDL less than 100 12/22/2013  . Routine general medical examination at a health care facility 08/03/2013  . Lumbar compression fracture ([place]) 11/01/2012  . Pure hyperglyceridemia 08/05/2012  . Gastroparesis due to DM ([place]) 08/05/2012  . Other screening mammogram 08/05/2012  . Diabetes mellitus with neurological manifestations, uncontrolled ([place]) 04/05/2012  . HTN (hypertension), benign 04/05/2012  . GERD (gastroesophageal reflux disease) 04/05/2012  . Morbid obesity ([place]) 04/05/2012    Past Surgical History:  Procedure Laterality Date  . CARDIAC CATHETERIZATION  05/13/2012   mod luminal irregularity of pLAD, no sign CAD, EF 65%.  . CHOLECYSTECTOMY  1970's  . KYPHOPLASTY N/A 02/10/2013   Procedure:  LUMBAR TWO KYPHOPLASTY;  Surgeon: Ophelia Charter, MD;  Location: [facility];  Service: Neurosurgery;  Laterality: N/A;  L2 Kyphoplasty; Will use Stern's Carm.  Marland Kitchen TOTAL ELBOW REPLACEMENT  ~ 2005   "right" (09/14/2012)  . TOTAL KNEE ARTHROPLASTY  09/13/2012   Procedure: TOTAL KNEE ARTHROPLASTY;  Surgeon: Vickey Huger, MD;  Location: [facility];  Service: Orthopedics;  Laterality: Right;  . TUBAL LIGATION  1970's  . VAGINAL HYSTERECTOMY  1970's    Current Outpatient Rx  . Order #: [id number] Class: Historical Med  . Order #: [id number] Class: Normal  . Order #:  [id number] Class: Sample  . Order #: [id number] Class: Normal  . Order #: [id number] Class: Normal  . Order #: [id number] Class: Print  . Order #: [id number] Class: Normal  . Order #: [id number] Class: Normal  . Order #: [id number] Class: Normal  . Order #: [id number] Class: Normal  . Order #: [id number] Class: Normal  . Order #: [id number] Class: Normal  . Order #: [id number] Class: Normal  . Order #: [id number] Class: Normal    Allergies Invokana [canagliflozin]; Lisinopril; Amlodipine; Tape; and Percocet [oxycodone-acetaminophen]  Family History  Problem Relation Age of Onset  . Arthritis Mother   . Arthritis Father    . Hypertension Father   . Diabetes Father   . Colon cancer Neg Hx     Social History Social History   Tobacco Use  . Smoking status: Former Smoker    Packs/day: 0.25    Years: 20.00    Pack years: 5.00    Types: Cigarettes    Last attempt to quit: 10/13/2009    Years since quitting: 8.2  . Smokeless tobacco: Never Used  Substance Use Topics  . Alcohol use: No    Comment: 09/14/2012 "did drink a little in my younger days"  . Drug use: No    Review of Systems  All other systems negative except as documented in the HPI. All pertinent positives and negatives as reviewed in the HPI. ____________________________________________   PHYSICAL EXAM:  VITAL SIGNS: ED Triage Vitals  Enc Vitals Group     BP 12/27/17 1558 (!) 160/126     Pulse Rate 12/27/17 1558 (!) 107     Resp 12/27/17 1558 16     Temp 12/27/17 1558 98.7 F (37.1 C)     Temp Source 12/27/17 1558 Oral     SpO2 12/27/17 1558 98 %     Weight 12/27/17 1559 216 lb (98 kg)     Height 12/27/17 1559 5\' 4"  (1.626 m)    Constitutional: Alert and oriented. Well appearing and in no acute distress. Eyes: Conjunctivae are normal. PERRL. EOMI. Head: Atraumatic. Nose: No congestion/rhinnorhea. Mouth/Throat: Mucous membranes are moist.  Oropharynx non-erythematous. Neck: No stridor.  No meningeal signs.   Cardiovascular: Normal rate, regular rhythm. Good peripheral circulation. Grossly normal heart sounds.   Respiratory: Normal respiratory effort.  No retractions. Lungs CTAB. Gastrointestinal: Soft and nontender. No distention.  Musculoskeletal: Protecting her right shoulder.  Pain worse with range of motion of the shoulder and palpation.  Normal pulse.  No other obvious injuries.  She does have some tenderness to her right chest. Neurologic:  Normal speech and language. No gross focal neurologic deficits are appreciated.  Skin:  Skin is warm, dry and intact. No rash noted.   ____________________________________________     [id number]  Dg Shoulder Right  Result Date: 12/27/2017 CLINICAL DATA:  67 y/o F; fall with pain and limited mobility of the right proximal humerus. EXAM: RIGHT HUMERUS - 2+ VIEW; RIGHT SHOULDER - 2+ VIEW COMPARISON:  10/29/2016 right elbow radiographs. FINDINGS: Right humerus: Acute impacted and minimally displaced right humerus surgical neck fracture with fracture extending into greater tuberosity. Inferior subluxation of humeral head. No other fracture identified. Normal elbow joint articulation. Elbow osteoarthrosis with osteophytosis. Radial head prosthesis noted. Right shoulder: Acute impacted and minimally displaced right humerus surgical neck fracture with fracture extending into greater tuberosity. Inferior subluxation of humeral head. IMPRESSION: 1. Acute impacted and minimally displaced right humerus surgical neck fracture with fracture extending into greater tuberosity. Inferior subluxation of humeral head. 2. No other fracture or dislocation identified. Electronically Signed  By: Kristine Garbe M.D.   On: 12/27/2017 17:45   Dg Humerus Right  Result Date: 12/27/2017 CLINICAL DATA:  67 y/o F; fall with pain and limited mobility of the right proximal humerus. EXAM: RIGHT HUMERUS - 2+ VIEW; RIGHT SHOULDER - 2+ VIEW COMPARISON:  10/29/2016 right elbow radiographs. FINDINGS: Right humerus: Acute impacted and minimally displaced right humerus surgical neck fracture with fracture extending into greater tuberosity. Inferior subluxation of humeral head. No other fracture identified. Normal elbow joint articulation. Elbow osteoarthrosis with osteophytosis. Radial head prosthesis noted. Right shoulder: Acute impacted and minimally displaced right humerus surgical neck fracture with fracture extending into greater tuberosity. Inferior subluxation of humeral head. IMPRESSION: 1. Acute impacted and minimally displaced right humerus surgical neck fracture with fracture extending into greater  tuberosity. Inferior subluxation of humeral head. 2. No other fracture or dislocation identified. Electronically Signed   By: Kristine Garbe M.D.   On: 12/27/2017 17:45    ____________________________________________   PROCEDURES  Procedure(s) performed:   Procedures   ____________________________________________   INITIAL IMPRESSION / ASSESSMENT AND PLAN / ED COURSE  X-rays consistent with a right surgical neck fracture of her humerus.  Pulses are intact.  She may have some occult rib fractures however the treatment would just be pain control which she is given for humerus fracture anyway.  She says she is fractured this arm before and is seeing someone at [facility] I will refer her back there for further management and surgical options with pain meds and sling immobilizer.   Pertinent labs & imaging results that were available during my care of the patient were reviewed by me and considered in my medical decision making (see chart for details).  ____________________________________________  FINAL CLINICAL IMPRESSION(S) / ED DIAGNOSES  Final diagnoses:  Closed displaced fracture of surgical neck of right humerus, unspecified fracture morphology, initial encounter     MEDICATIONS GIVEN DURING THIS VISIT:  Medications  HYDROcodone-acetaminophen (NORCO/VICODIN) 5-325 MG per tablet 1 tablet (not administered)     NEW OUTPATIENT MEDICATIONS STARTED DURING THIS VISIT:  New Prescriptions   HYDROCODONE-ACETAMINOPHEN (NORCO/VICODIN) 5-325 MG TABLET    Take 1-2 tablets by mouth every 6 (six) hours as needed for severe pain.    Note:  This note was prepared with assistance of Dragon voice recognition software. Occasional wrong-word or sound-a-like substitutions may have occurred due to the inherent limitations of voice recognition software.   Tallis Soledad, Corene Cornea, MD 12/27/17 [phone]

## 2017-12-29 ENCOUNTER — Ambulatory Visit (INDEPENDENT_AMBULATORY_CARE_PROVIDER_SITE_OTHER): Payer: PPO | Admitting: Orthopaedic Surgery

## 2017-12-29 ENCOUNTER — Ambulatory Visit: Payer: PPO | Admitting: Internal Medicine

## 2017-12-29 ENCOUNTER — Encounter (INDEPENDENT_AMBULATORY_CARE_PROVIDER_SITE_OTHER): Payer: Self-pay | Admitting: Orthopaedic Surgery

## 2017-12-29 DIAGNOSIS — S42291A Other displaced fracture of upper end of right humerus, initial encounter for closed fracture: Secondary | ICD-10-CM

## 2017-12-29 NOTE — Progress Notes (Signed)
Office Visit Note   Patient: Abigail Collins           Date of Birth: 28-Jan-1951           MRN: [id number] Visit Date: 12/29/2017              Requested by: Janith Lima, MD [address] [place], [place] [address] PCP: Janith Lima, MD   Assessment & Plan: Visit Diagnoses:  1. Other closed displaced fracture of proximal end of right humerus, initial encounter     Plan: Impression a 67 year old female with acute right four-part proximal humerus fracture.  CT scan to assess fracture characteristics to determine if surgical treatment is necessary.  We will be in touch with the patient regarding the results.  Follow-Up Instructions: Return if symptoms worsen or fail to improve.   Orders:  Orders Placed This Encounter  Procedures  . CT SHOULDER RIGHT WO CONTRAST   No orders of the defined types were placed in this encounter.     Procedures: No procedures performed   Clinical Data: No additional findings.   Subjective: Chief Complaint  Patient presents with  . Right Shoulder - Pain    Patient is a 67 year old female who comes in with an acute right proximal humerus fracture.  She had a mechanical fall while doing yard work 2 days ago.  She does endorse severe pain of her right shoulder.  Denies any numbness and tingling.    Review of Systems  Constitutional: Negative.   HENT: Negative.   Eyes: Negative.   Respiratory: Negative.   Cardiovascular: Negative.   Endocrine: Negative.   Musculoskeletal: Negative.   Neurological: Negative.   Hematological: Negative.   Psychiatric/Behavioral: Negative.   All other systems reviewed and are negative.    Objective: Vital Signs: There were no vitals taken for this visit.  Physical Exam  Constitutional: She is oriented to person, place, and time. She appears well-developed and well-nourished.  Pulmonary/Chest: Effort normal.  Neurological: She is alert and oriented to person, place, and time.    Skin: Skin is warm. Capillary refill takes less than 2 seconds.  Psychiatric: She has a normal mood and affect. Her behavior is normal. Judgment and thought content normal.  Nursing note and vitals reviewed.   Ortho Exam Right upper extremity exam is neurovascularly intact.  She has some mild bruising around the shoulder region.  There is no skin lesions. Specialty Comments:  No specialty comments available.  Imaging: No results found.   PMFS History: Patient Active Problem List   Diagnosis Date Noted  . Upper respiratory tract infection 11/26/2017  . BV (bacterial vaginosis) 10/01/2017  . Yeast vaginitis 07/01/2017  . Excessive daytime sleepiness 05/27/2016  . COPD mixed type ([place]) 01/09/2016  . Visit for screening mammogram 03/28/2015  . Colon cancer screening 03/28/2015  . Depression with somatization 03/28/2015  . Insomnia 03/28/2015  . Primary osteoarthritis of both knees 03/28/2015  . Allergic rhinitis 03/15/2014  . Hyperlipidemia with target LDL less than 100 12/22/2013  . Routine general medical examination at a health care facility 08/03/2013  . Lumbar compression fracture ([place]) 11/01/2012  . Pure hyperglyceridemia 08/05/2012  . Gastroparesis due to DM ([place]) 08/05/2012  . Other screening mammogram 08/05/2012  . Diabetes mellitus with neurological manifestations, uncontrolled ([place]) 04/05/2012  . HTN (hypertension), benign 04/05/2012  . GERD (gastroesophageal reflux disease) 04/05/2012  . Morbid obesity ([place]) 04/05/2012   Past Medical History:  Diagnosis Date  . Anemia  hx of  . Anginal pain ([place])    "left arm pain, sees Dr. Etter Sjogren, had card cath 2013"  . Arthritis    "all over" (09/14/2012)  . Asthma    takes inhaler 2x day  . Bronchitis    hx of  . Bulging disc    "lower"  . Carpal tunnel syndrome of left wrist   . Depression    denies  . Exertional dyspnea    "sometimes laying down" (09/14/2012)  . Hyperlipidemia   . Hypertension    sees Dr.  Debby Freiberg, primary  . Pneumonia 03/2012  . PONV (postoperative nausea and vomiting)   . Thyroid disease 1960's   "don't have it now" (09/14/2012)  . Type II diabetes mellitus ([place])   . Urinary tract infection    hx of  . Vomiting    pt states she vomits every am    Family History  Problem Relation Age of Onset  . Arthritis Mother   . Arthritis Father   . Hypertension Father   . Diabetes Father   . Colon cancer Neg Hx     Past Surgical History:  Procedure Laterality Date  . CARDIAC CATHETERIZATION  05/13/2012   mod luminal irregularity of pLAD, no sign CAD, EF 65%.  . CHOLECYSTECTOMY  1970's  . KYPHOPLASTY N/A 02/10/2013   Procedure:  LUMBAR TWO KYPHOPLASTY;  Surgeon: Ophelia Charter, MD;  Location: [facility];  Service: Neurosurgery;  Laterality: N/A;  L2 Kyphoplasty; Will use Stern's Carm.  Marland Kitchen TOTAL ELBOW REPLACEMENT  ~ 2005   "right" (09/14/2012)  . TOTAL KNEE ARTHROPLASTY  09/13/2012   Procedure: TOTAL KNEE ARTHROPLASTY;  Surgeon: Vickey Huger, MD;  Location: [facility];  Service: Orthopedics;  Laterality: Right;  . TUBAL LIGATION  1970's  . VAGINAL HYSTERECTOMY  1970's   Social History   Occupational History  . Not on file  Tobacco Use  . Smoking status: Former Smoker    Packs/day: 0.25    Years: 20.00    Pack years: 5.00    Types: Cigarettes    Last attempt to quit: 10/13/2009    Years since quitting: 8.2  . Smokeless tobacco: Never Used  Substance and Sexual Activity  . Alcohol use: No    Comment: 09/14/2012 "did drink a little in my younger days"  . Drug use: No  . Sexual activity: Not Currently

## 2017-12-30 ENCOUNTER — Other Ambulatory Visit (INDEPENDENT_AMBULATORY_CARE_PROVIDER_SITE_OTHER): Payer: Self-pay | Admitting: Orthopaedic Surgery

## 2017-12-30 ENCOUNTER — Telehealth: Payer: Self-pay

## 2017-12-30 DIAGNOSIS — S42291A Other displaced fracture of upper end of right humerus, initial encounter for closed fracture: Secondary | ICD-10-CM

## 2017-12-30 NOTE — Telephone Encounter (Signed)
Copied from [facility] [id number]. Topic: General - Other >> Dec 28, 2017  8:44 AM Carolyn Stare wrote:  Pt is asking for some samples  of the below med    Insulin Glargine (TOUJEO SOLOSTAR) 300 UNIT/ML SOPN   Semaglutide (OZEMPIC) 0.25 or 0.5 MG/DOSE SOPN

## 2017-12-30 NOTE — Telephone Encounter (Signed)
Called pt and informed that we have samples in a clear bag in the glass frig on A side.

## 2017-12-31 ENCOUNTER — Ambulatory Visit
Admission: RE | Admit: 2017-12-31 | Discharge: 2017-12-31 | Disposition: A | Discharge status: Home / Self Care | Payer: PPO | Source: Ambulatory Visit | Attending: Orthopaedic Surgery | Admitting: Orthopaedic Surgery

## 2017-12-31 DIAGNOSIS — S42211A Unspecified displaced fracture of surgical neck of right humerus, initial encounter for closed fracture: Secondary | ICD-10-CM | POA: Diagnosis not present

## 2017-12-31 DIAGNOSIS — S42291A Other displaced fracture of upper end of right humerus, initial encounter for closed fracture: Secondary | ICD-10-CM

## 2017-12-31 DIAGNOSIS — R937 Abnormal findings on diagnostic imaging of other parts of musculoskeletal system: Secondary | ICD-10-CM | POA: Diagnosis not present

## 2018-01-05 NOTE — Progress Notes (Signed)
Pre-op instructions given only; please complete assessment on DOS. Pt made aware to take 25 units of Toujeo at HS and no Metformin or Semaglutide on DOS. Pt made aware to check BG every 2 hours prior to arrival to hospital on DOS. Pt made aware to treat a BG < 70 with 4 ounces of apple or cranberry juice, wait 15 minutes after intervention to recheck BG, if BG remains < 70, call Short Stay unit to speak with a nurse. Pt verbalized understanding of all pre-op instructions.

## 2018-01-06 ENCOUNTER — Observation Stay (HOSPITAL_COMMUNITY)
Admission: RE | Admit: 2018-01-06 | Discharge: 2018-01-08 | Disposition: A | Discharge status: Home / Self Care | Payer: PPO | Source: Ambulatory Visit | Attending: Orthopaedic Surgery | Admitting: Orthopaedic Surgery

## 2018-01-06 ENCOUNTER — Encounter (HOSPITAL_COMMUNITY): Payer: Self-pay | Admitting: *Deleted

## 2018-01-06 ENCOUNTER — Inpatient Hospital Stay (HOSPITAL_COMMUNITY): Payer: PPO | Admitting: Anesthesiology

## 2018-01-06 ENCOUNTER — Inpatient Hospital Stay (HOSPITAL_COMMUNITY): Payer: PPO

## 2018-01-06 ENCOUNTER — Encounter (HOSPITAL_COMMUNITY)
Admission: RE | Disposition: A | Discharge status: Home / Self Care | Payer: Self-pay | Source: Ambulatory Visit | Attending: Orthopaedic Surgery

## 2018-01-06 ENCOUNTER — Other Ambulatory Visit: Payer: Self-pay

## 2018-01-06 DIAGNOSIS — E669 Obesity, unspecified: Secondary | ICD-10-CM | POA: Diagnosis not present

## 2018-01-06 DIAGNOSIS — X58XXXA Exposure to other specified factors, initial encounter: Secondary | ICD-10-CM | POA: Insufficient documentation

## 2018-01-06 DIAGNOSIS — K219 Gastro-esophageal reflux disease without esophagitis: Secondary | ICD-10-CM | POA: Diagnosis not present

## 2018-01-06 DIAGNOSIS — Z419 Encounter for procedure for purposes other than remedying health state, unspecified: Secondary | ICD-10-CM

## 2018-01-06 DIAGNOSIS — Z7951 Long term (current) use of inhaled steroids: Secondary | ICD-10-CM | POA: Insufficient documentation

## 2018-01-06 DIAGNOSIS — Z6837 Body mass index (BMI) 37.0-37.9, adult: Secondary | ICD-10-CM | POA: Insufficient documentation

## 2018-01-06 DIAGNOSIS — Z87891 Personal history of nicotine dependence: Secondary | ICD-10-CM | POA: Diagnosis not present

## 2018-01-06 DIAGNOSIS — F329 Major depressive disorder, single episode, unspecified: Secondary | ICD-10-CM | POA: Diagnosis not present

## 2018-01-06 DIAGNOSIS — E785 Hyperlipidemia, unspecified: Secondary | ICD-10-CM | POA: Insufficient documentation

## 2018-01-06 DIAGNOSIS — J449 Chronic obstructive pulmonary disease, unspecified: Secondary | ICD-10-CM | POA: Insufficient documentation

## 2018-01-06 DIAGNOSIS — Z79899 Other long term (current) drug therapy: Secondary | ICD-10-CM | POA: Insufficient documentation

## 2018-01-06 DIAGNOSIS — S42201A Unspecified fracture of upper end of right humerus, initial encounter for closed fracture: Secondary | ICD-10-CM | POA: Diagnosis not present

## 2018-01-06 DIAGNOSIS — I10 Essential (primary) hypertension: Secondary | ICD-10-CM | POA: Insufficient documentation

## 2018-01-06 DIAGNOSIS — Z794 Long term (current) use of insulin: Secondary | ICD-10-CM | POA: Insufficient documentation

## 2018-01-06 DIAGNOSIS — M1990 Unspecified osteoarthritis, unspecified site: Secondary | ICD-10-CM | POA: Insufficient documentation

## 2018-01-06 DIAGNOSIS — E119 Type 2 diabetes mellitus without complications: Secondary | ICD-10-CM | POA: Insufficient documentation

## 2018-01-06 DIAGNOSIS — Z9889 Other specified postprocedural states: Secondary | ICD-10-CM

## 2018-01-06 DIAGNOSIS — S42241A 4-part fracture of surgical neck of right humerus, initial encounter for closed fracture: Secondary | ICD-10-CM | POA: Diagnosis not present

## 2018-01-06 DIAGNOSIS — G8918 Other acute postprocedural pain: Secondary | ICD-10-CM | POA: Diagnosis not present

## 2018-01-06 HISTORY — PX: ORIF HUMERUS FRACTURE: SHX2126

## 2018-01-06 HISTORY — DX: Headache, unspecified: R51.9

## 2018-01-06 HISTORY — DX: Headache: R51

## 2018-01-06 LAB — CBC
HCT: 37.9 % (ref 36.0–46.0)
Hemoglobin: 12 g/dL (ref 12.0–15.0)
MCH: 30 pg (ref 26.0–34.0)
MCHC: 31.7 g/dL (ref 30.0–36.0)
MCV: 94.8 fL (ref 78.0–100.0)
Platelets: 276 10*3/uL (ref 150–400)
RBC: 4 MIL/uL (ref 3.87–5.11)
RDW: 13.9 % (ref 11.5–15.5)
WBC: 9.4 10*3/uL (ref 4.0–10.5)

## 2018-01-06 LAB — HEMOGLOBIN A1C
Hgb A1c MFr Bld: 6.2 % — ABNORMAL HIGH (ref 4.8–5.6)
Mean Plasma Glucose: 131.24 mg/dL

## 2018-01-06 LAB — GLUCOSE, CAPILLARY
Glucose-Capillary: 135 mg/dL — ABNORMAL HIGH (ref 65–99)
Glucose-Capillary: 148 mg/dL — ABNORMAL HIGH (ref 65–99)
Glucose-Capillary: 237 mg/dL — ABNORMAL HIGH (ref 65–99)
Glucose-Capillary: 359 mg/dL — ABNORMAL HIGH (ref 65–99)

## 2018-01-06 LAB — BASIC METABOLIC PANEL WITH GFR
Anion gap: 12 (ref 5–15)
BUN: 15 mg/dL (ref 6–20)
CO2: 25 mmol/L (ref 22–32)
Calcium: 9.8 mg/dL (ref 8.9–10.3)
Chloride: 102 mmol/L (ref 101–111)
Creatinine, Ser: 0.63 mg/dL (ref 0.44–1.00)
GFR calc Af Amer: >60 mL/min
GFR calc non Af Amer: >60 mL/min
Glucose, Bld: 136 mg/dL — ABNORMAL HIGH (ref 65–99)
Potassium: 3.6 mmol/L (ref 3.5–5.1)
Sodium: 139 mmol/L (ref 135–145)

## 2018-01-06 SURGERY — OPEN REDUCTION INTERNAL FIXATION (ORIF) PROXIMAL HUMERUS FRACTURE
Anesthesia: Regional | Site: Arm Upper | Laterality: Right

## 2018-01-06 MED ORDER — MIDAZOLAM HCL 2 MG/2ML IJ SOLN
1.0000 mg | Freq: Once | INTRAMUSCULAR | Status: AC
  Administered 2018-01-06: 1 mg via INTRAVENOUS

## 2018-01-06 MED ORDER — ATORVASTATIN CALCIUM 20 MG PO TABS: 20.0000 mg | ORAL_TABLET | Freq: Every day | ORAL | Status: DC

## 2018-01-06 MED ORDER — FENTANYL CITRATE (PF) 100 MCG/2ML IJ SOLN
INTRAMUSCULAR | Status: DC | PRN
  Administered 2018-01-06: 100 ug via INTRAVENOUS

## 2018-01-06 MED ORDER — SENNOSIDES-DOCUSATE SODIUM 8.6-50 MG PO TABS: 1.0000 | ORAL_TABLET | Freq: Every evening | ORAL | 1 refills | Status: DC | PRN

## 2018-01-06 MED ORDER — ZINC SULFATE 220 (50 ZN) MG PO CAPS: 220.0000 mg | ORAL_CAPSULE | Freq: Every day | ORAL | 0 refills | Status: DC

## 2018-01-06 MED ORDER — DIPHENHYDRAMINE HCL 12.5 MG/5ML PO ELIX: 25.0000 mg | ORAL_SOLUTION | ORAL | Status: DC | PRN

## 2018-01-06 MED ORDER — ONDANSETRON HCL 4 MG/2ML IJ SOLN: 4.0000 mg | Freq: Once | INTRAMUSCULAR | Status: DC | PRN

## 2018-01-06 MED ORDER — PHENYLEPHRINE HCL 10 MG/ML IJ SOLN
INTRAMUSCULAR | Status: DC | PRN
  Administered 2018-01-06: 20 ug/min via INTRAVENOUS

## 2018-01-06 MED ORDER — TRANEXAMIC ACID 1000 MG/10ML IV SOLN
INTRAVENOUS | Status: AC | PRN
  Administered 2018-01-06: 2000 mg via TOPICAL

## 2018-01-06 MED ORDER — MAGNESIUM CITRATE PO SOLN: 1.0000 | Freq: Once | ORAL | Status: DC | PRN

## 2018-01-06 MED ORDER — MIDAZOLAM HCL 5 MG/5ML IJ SOLN
INTRAMUSCULAR | Status: DC | PRN
  Administered 2018-01-06: 1 mg via INTRAVENOUS

## 2018-01-06 MED ORDER — PROPOFOL 10 MG/ML IV BOLUS
INTRAVENOUS | Status: DC | PRN
  Administered 2018-01-06: 160 mg via INTRAVENOUS

## 2018-01-06 MED ORDER — HYDROCODONE-ACETAMINOPHEN 7.5-325 MG PO TABS: 1.0000 | ORAL_TABLET | Freq: Four times a day (QID) | ORAL | 0 refills | Status: DC | PRN

## 2018-01-06 MED ORDER — CHLORHEXIDINE GLUCONATE 4 % EX LIQD: 60.0000 mL | Freq: Once | CUTANEOUS | Status: DC

## 2018-01-06 MED ORDER — CHLORTHALIDONE 25 MG PO TABS: 25.0000 mg | ORAL_TABLET | Freq: Every day | ORAL | Status: DC

## 2018-01-06 MED ORDER — POLYETHYLENE GLYCOL 3350 17 G PO PACK: 17.0000 g | PACK | Freq: Every day | ORAL | Status: DC | PRN

## 2018-01-06 MED ORDER — KETOROLAC TROMETHAMINE 15 MG/ML IJ SOLN
15.0000 mg | Freq: Four times a day (QID) | INTRAMUSCULAR | Status: AC
  Administered 2018-01-06 – 2018-01-07 (×4): 15 mg via INTRAVENOUS
  Filled 2018-01-06 (×4): qty 1

## 2018-01-06 MED ORDER — ROPIVACAINE HCL 7.5 MG/ML IJ SOLN
INTRAMUSCULAR | Status: DC | PRN
  Administered 2018-01-06: 20 mL via PERINEURAL

## 2018-01-06 MED ORDER — SEMAGLUTIDE(0.25 OR 0.5MG/DOS) 2 MG/1.5ML ~~LOC~~ SOPN
0.5000 mg | PEN_INJECTOR | SUBCUTANEOUS | Status: DC
  Administered 2018-01-06: 0.5 mg via SUBCUTANEOUS
  Filled 2018-01-06: qty 1

## 2018-01-06 MED ORDER — SEMAGLUTIDE(0.25 OR 0.5MG/DOS) 2 MG/1.5ML ~~LOC~~ SOPN: 0.5000 mg | PEN_INJECTOR | SUBCUTANEOUS | Status: DC

## 2018-01-06 MED ORDER — SULFAMETHOXAZOLE-TRIMETHOPRIM 800-160 MG PO TABS: 1.0000 | ORAL_TABLET | Freq: Two times a day (BID) | ORAL | 0 refills | Status: DC

## 2018-01-06 MED ORDER — ONDANSETRON HCL 4 MG/2ML IJ SOLN
INTRAMUSCULAR | Status: DC | PRN
  Administered 2018-01-06: 4 mg via INTRAVENOUS

## 2018-01-06 MED ORDER — PROMETHAZINE HCL 25 MG PO TABS: 25.0000 mg | ORAL_TABLET | Freq: Four times a day (QID) | ORAL | 1 refills | Status: DC | PRN

## 2018-01-06 MED ORDER — DOCUSATE SODIUM 100 MG PO CAPS
100.0000 mg | ORAL_CAPSULE | Freq: Two times a day (BID) | ORAL | Status: DC
  Administered 2018-01-06 – 2018-01-08 (×4): 100 mg via ORAL
  Filled 2018-01-06 (×4): qty 1

## 2018-01-06 MED ORDER — FENTANYL CITRATE (PF) 100 MCG/2ML IJ SOLN
50.0000 ug | Freq: Once | INTRAMUSCULAR | Status: AC
  Administered 2018-01-06: 50 ug via INTRAVENOUS

## 2018-01-06 MED ORDER — DEXAMETHASONE SODIUM PHOSPHATE 10 MG/ML IJ SOLN
INTRAMUSCULAR | Status: DC | PRN
  Administered 2018-01-06: 10 mg via INTRAVENOUS

## 2018-01-06 MED ORDER — METOCLOPRAMIDE HCL 5 MG/ML IJ SOLN: 5.0000 mg | Freq: Three times a day (TID) | INTRAMUSCULAR | Status: DC | PRN

## 2018-01-06 MED ORDER — ROCURONIUM BROMIDE 100 MG/10ML IV SOLN
INTRAVENOUS | Status: DC | PRN
  Administered 2018-01-06: 50 mg via INTRAVENOUS

## 2018-01-06 MED ORDER — TRANEXAMIC ACID 1000 MG/10ML IV SOLN
2000.0000 mg | Freq: Once | INTRAVENOUS | Status: DC
  Filled 2018-01-06: qty 20

## 2018-01-06 MED ORDER — MIDAZOLAM HCL 2 MG/2ML IJ SOLN
INTRAMUSCULAR | Status: AC
  Filled 2018-01-06: qty 2

## 2018-01-06 MED ORDER — ALBUMIN HUMAN 5 % IV SOLN
INTRAVENOUS | Status: DC | PRN
  Administered 2018-01-06: 13:00:00 via INTRAVENOUS

## 2018-01-06 MED ORDER — METHOCARBAMOL 750 MG PO TABS: 750.0000 mg | ORAL_TABLET | Freq: Two times a day (BID) | ORAL | 0 refills | Status: DC | PRN

## 2018-01-06 MED ORDER — METHOCARBAMOL 500 MG PO TABS
500.0000 mg | ORAL_TABLET | Freq: Four times a day (QID) | ORAL | Status: DC | PRN
  Administered 2018-01-08: 500 mg via ORAL
  Filled 2018-01-06: qty 1

## 2018-01-06 MED ORDER — 0.9 % SODIUM CHLORIDE (POUR BTL) OPTIME
TOPICAL | Status: DC | PRN
Start: 2018-01-06 — End: 2018-01-06
  Administered 2018-01-06 (×3): 1000 mL

## 2018-01-06 MED ORDER — LORATADINE 10 MG PO TABS
10.0000 mg | ORAL_TABLET | Freq: Every day | ORAL | Status: DC
  Filled 2018-01-06 (×2): qty 1

## 2018-01-06 MED ORDER — SUCCINYLCHOLINE CHLORIDE 200 MG/10ML IV SOSY
PREFILLED_SYRINGE | INTRAVENOUS | Status: AC
  Filled 2018-01-06: qty 10

## 2018-01-06 MED ORDER — SCOPOLAMINE 1 MG/3DAYS TD PT72
MEDICATED_PATCH | TRANSDERMAL | Status: AC
  Filled 2018-01-06: qty 1

## 2018-01-06 MED ORDER — INSULIN ASPART 100 UNIT/ML ~~LOC~~ SOLN
0.0000 [IU] | Freq: Every day | SUBCUTANEOUS | Status: DC
  Administered 2018-01-06: 5 [IU] via SUBCUTANEOUS

## 2018-01-06 MED ORDER — ACETAMINOPHEN 325 MG PO TABS: 325.0000 mg | ORAL_TABLET | Freq: Four times a day (QID) | ORAL | Status: DC | PRN

## 2018-01-06 MED ORDER — FENTANYL CITRATE (PF) 100 MCG/2ML IJ SOLN: 25.0000 ug | INTRAMUSCULAR | Status: DC | PRN

## 2018-01-06 MED ORDER — PROPOFOL 500 MG/50ML IV EMUL
INTRAVENOUS | Status: DC | PRN
  Administered 2018-01-06: 150 ug/kg/min via INTRAVENOUS

## 2018-01-06 MED ORDER — ALBUTEROL SULFATE (2.5 MG/3ML) 0.083% IN NEBU: 2.5000 mg | INHALATION_SOLUTION | Freq: Four times a day (QID) | RESPIRATORY_TRACT | Status: DC | PRN

## 2018-01-06 MED ORDER — CEFAZOLIN SODIUM-DEXTROSE 2-4 GM/100ML-% IV SOLN
2.0000 g | Freq: Four times a day (QID) | INTRAVENOUS | Status: AC
  Administered 2018-01-06: 2 g via INTRAVENOUS
  Filled 2018-01-06 (×2): qty 100

## 2018-01-06 MED ORDER — SUGAMMADEX SODIUM 200 MG/2ML IV SOLN
INTRAVENOUS | Status: AC
  Filled 2018-01-06: qty 2

## 2018-01-06 MED ORDER — ONDANSETRON HCL 4 MG PO TABS: 4.0000 mg | ORAL_TABLET | Freq: Four times a day (QID) | ORAL | Status: DC | PRN

## 2018-01-06 MED ORDER — SORBITOL 70 % SOLN: 30.0000 mL | Freq: Every day | Status: DC | PRN

## 2018-01-06 MED ORDER — FENTANYL CITRATE (PF) 250 MCG/5ML IJ SOLN
INTRAMUSCULAR | Status: AC
  Filled 2018-01-06: qty 5

## 2018-01-06 MED ORDER — HYDROCODONE-ACETAMINOPHEN 5-325 MG PO TABS
1.0000 | ORAL_TABLET | ORAL | Status: DC | PRN
  Administered 2018-01-06 – 2018-01-07 (×2): 1 via ORAL
  Filled 2018-01-06 (×3): qty 1

## 2018-01-06 MED ORDER — SODIUM CHLORIDE 0.9 % IV SOLN
INTRAVENOUS | Status: DC
  Administered 2018-01-06: 19:00:00 via INTRAVENOUS

## 2018-01-06 MED ORDER — HYDROCODONE-ACETAMINOPHEN 7.5-325 MG PO TABS
1.0000 | ORAL_TABLET | ORAL | Status: DC | PRN
  Administered 2018-01-07: 2 via ORAL
  Administered 2018-01-07: 1 via ORAL
  Administered 2018-01-08: 2 via ORAL
  Administered 2018-01-08: 1 via ORAL
  Filled 2018-01-06 (×2): qty 2
  Filled 2018-01-06: qty 1

## 2018-01-06 MED ORDER — METHOCARBAMOL 1000 MG/10ML IJ SOLN
500.0000 mg | Freq: Four times a day (QID) | INTRAVENOUS | Status: DC | PRN
  Filled 2018-01-06: qty 5

## 2018-01-06 MED ORDER — LIDOCAINE HCL (CARDIAC) 20 MG/ML IV SOLN
INTRAVENOUS | Status: AC
  Filled 2018-01-06: qty 5

## 2018-01-06 MED ORDER — CEFAZOLIN SODIUM-DEXTROSE 2-4 GM/100ML-% IV SOLN
2.0000 g | INTRAVENOUS | Status: AC
  Administered 2018-01-06: 2 g via INTRAVENOUS

## 2018-01-06 MED ORDER — PHENYLEPHRINE HCL 10 MG/ML IJ SOLN
INTRAMUSCULAR | Status: DC | PRN
  Administered 2018-01-06 (×3): 80 ug via INTRAVENOUS

## 2018-01-06 MED ORDER — MOMETASONE FURO-FORMOTEROL FUM 100-5 MCG/ACT IN AERO
2.0000 | INHALATION_SPRAY | Freq: Two times a day (BID) | RESPIRATORY_TRACT | Status: DC
  Filled 2018-01-06: qty 8.8

## 2018-01-06 MED ORDER — INSULIN ASPART 100 UNIT/ML ~~LOC~~ SOLN
0.0000 [IU] | Freq: Three times a day (TID) | SUBCUTANEOUS | Status: DC
  Administered 2018-01-07: 3 [IU] via SUBCUTANEOUS
  Administered 2018-01-07: 4 [IU] via SUBCUTANEOUS
  Administered 2018-01-08: 3 [IU] via SUBCUTANEOUS

## 2018-01-06 MED ORDER — PHENYLEPHRINE 40 MCG/ML (10ML) SYRINGE FOR IV PUSH (FOR BLOOD PRESSURE SUPPORT)
PREFILLED_SYRINGE | INTRAVENOUS | Status: AC
  Filled 2018-01-06: qty 10

## 2018-01-06 MED ORDER — EPHEDRINE 5 MG/ML INJ
INTRAVENOUS | Status: AC
  Filled 2018-01-06: qty 10

## 2018-01-06 MED ORDER — CEFAZOLIN SODIUM-DEXTROSE 2-4 GM/100ML-% IV SOLN
INTRAVENOUS | Status: AC
  Filled 2018-01-06: qty 100

## 2018-01-06 MED ORDER — ONDANSETRON HCL 4 MG PO TABS: 4.0000 mg | ORAL_TABLET | Freq: Three times a day (TID) | ORAL | 0 refills | Status: DC | PRN

## 2018-01-06 MED ORDER — CALCIUM CARBONATE-VITAMIN D 500-200 MG-UNIT PO TABS: 1.0000 | ORAL_TABLET | Freq: Three times a day (TID) | ORAL | 12 refills | Status: DC

## 2018-01-06 MED ORDER — INSULIN GLARGINE 100 UNIT/ML ~~LOC~~ SOLN
50.0000 [IU] | Freq: Every day | SUBCUTANEOUS | Status: DC
  Administered 2018-01-07 – 2018-01-08 (×2): 50 [IU] via SUBCUTANEOUS
  Filled 2018-01-06 (×3): qty 0.5

## 2018-01-06 MED ORDER — LIDOCAINE HCL (CARDIAC) 20 MG/ML IV SOLN
INTRAVENOUS | Status: DC | PRN
  Administered 2018-01-06: 60 mg via INTRAVENOUS

## 2018-01-06 MED ORDER — LACTATED RINGERS IV SOLN
INTRAVENOUS | Status: DC
  Administered 2018-01-06: 11:00:00 via INTRAVENOUS

## 2018-01-06 MED ORDER — MIDAZOLAM HCL 2 MG/2ML IJ SOLN
INTRAMUSCULAR | Status: AC
  Administered 2018-01-06: 1 mg via INTRAVENOUS
  Filled 2018-01-06: qty 2

## 2018-01-06 MED ORDER — MORPHINE SULFATE (PF) 2 MG/ML IV SOLN
0.5000 mg | INTRAVENOUS | Status: DC | PRN
  Administered 2018-01-07 (×2): 1 mg via INTRAVENOUS
  Filled 2018-01-06 (×2): qty 1

## 2018-01-06 MED ORDER — LEVOCETIRIZINE DIHYDROCHLORIDE 5 MG PO TABS: 5.0000 mg | ORAL_TABLET | Freq: Every evening | ORAL | Status: DC

## 2018-01-06 MED ORDER — ONDANSETRON HCL 4 MG/2ML IJ SOLN: 4.0000 mg | Freq: Four times a day (QID) | INTRAMUSCULAR | Status: DC | PRN

## 2018-01-06 MED ORDER — FENTANYL CITRATE (PF) 100 MCG/2ML IJ SOLN
INTRAMUSCULAR | Status: AC
  Administered 2018-01-06: 50 ug via INTRAVENOUS
  Filled 2018-01-06: qty 2

## 2018-01-06 MED ORDER — SUGAMMADEX SODIUM 200 MG/2ML IV SOLN
INTRAVENOUS | Status: DC | PRN
  Administered 2018-01-06: 200 mg via INTRAVENOUS

## 2018-01-06 MED ORDER — ONDANSETRON HCL 4 MG/2ML IJ SOLN
INTRAMUSCULAR | Status: AC
  Filled 2018-01-06: qty 2

## 2018-01-06 MED ORDER — PROPOFOL 10 MG/ML IV BOLUS
INTRAVENOUS | Status: AC
  Filled 2018-01-06: qty 20

## 2018-01-06 MED ORDER — IRBESARTAN 300 MG PO TABS
300.0000 mg | ORAL_TABLET | Freq: Every day | ORAL | Status: DC
  Administered 2018-01-06 – 2018-01-08 (×3): 300 mg via ORAL
  Filled 2018-01-06 (×3): qty 1

## 2018-01-06 MED ORDER — METOCLOPRAMIDE HCL 5 MG PO TABS: 5.0000 mg | ORAL_TABLET | Freq: Three times a day (TID) | ORAL | Status: DC | PRN

## 2018-01-06 SURGICAL SUPPLY — 68 items
BAG DECANTER FOR FLEXI CONT (MISCELLANEOUS) ×1 IMPLANT
BIT DRILL 3.2 (BIT) ×2
BIT DRILL 3.2XCALB NS DISP (BIT) IMPLANT
BIT DRILL CALIBRATED 2.7 (BIT) ×1 IMPLANT
BIT DRL 3.2XCALB NS DISP (BIT) ×1
BLADE SURG 10 STRL SS (BLADE) IMPLANT
BNDG CMPR 9X4 STRL LF SNTH (GAUZE/BANDAGES/DRESSINGS)
BNDG COHESIVE 4X5 TAN STRL (GAUZE/BANDAGES/DRESSINGS) ×1 IMPLANT
BNDG ESMARK 4X9 LF (GAUZE/BANDAGES/DRESSINGS) IMPLANT
COVER SURGICAL LIGHT HANDLE (MISCELLANEOUS) ×2 IMPLANT
CUFF TOURNIQUET SINGLE 18IN (TOURNIQUET CUFF) ×1 IMPLANT
CUFF TOURNIQUET SINGLE 24IN (TOURNIQUET CUFF) IMPLANT
DRAPE C-ARM 42X72 X-RAY (DRAPES) ×2 IMPLANT
DRAPE IMP U-DRAPE 54X76 (DRAPES) ×2 IMPLANT
DRAPE INCISE IOBAN 66X45 STRL (DRAPES) ×2 IMPLANT
DRAPE U-SHAPE 47X51 STRL (DRAPES) ×2 IMPLANT
DRSG TEGADERM 4X4.75 (GAUZE/BANDAGES/DRESSINGS) ×7 IMPLANT
ELECT CAUTERY BLADE 6.4 (BLADE) ×2 IMPLANT
ELECT REM PT RETURN 9FT ADLT (ELECTROSURGICAL) ×2
ELECTRODE REM PT RTRN 9FT ADLT (ELECTROSURGICAL) ×1 IMPLANT
FACESHIELD WRAPAROUND (MASK) IMPLANT
FACESHIELD WRAPAROUND OR TEAM (MASK) ×1 IMPLANT
GAUZE SPONGE 4X4 12PLY STRL (GAUZE/BANDAGES/DRESSINGS) ×2 IMPLANT
GAUZE SPONGE 4X4 12PLY STRL LF (GAUZE/BANDAGES/DRESSINGS) ×1 IMPLANT
GAUZE XEROFORM 5X9 LF (GAUZE/BANDAGES/DRESSINGS) ×2 IMPLANT
GLOVE BIOGEL PI IND STRL 7.0 (GLOVE) ×1 IMPLANT
GLOVE BIOGEL PI INDICATOR 7.0 (GLOVE) ×1
GLOVE ECLIPSE 7.0 STRL STRAW (GLOVE) ×2 IMPLANT
GLOVE SKINSENSE NS SZ7.5 (GLOVE) ×2
GLOVE SKINSENSE STRL SZ7.5 (GLOVE) ×2 IMPLANT
GOWN STRL REIN XL XLG (GOWN DISPOSABLE) ×6 IMPLANT
K-WIRE 2X5 SS THRDED S3 (WIRE) ×4
KIT BASIN OR (CUSTOM PROCEDURE TRAY) ×2 IMPLANT
KIT TURNOVER KIT B (KITS) ×2 IMPLANT
KWIRE 2X5 SS THRDED S3 (WIRE) IMPLANT
MANIFOLD NEPTUNE II (INSTRUMENTS) ×2 IMPLANT
NS IRRIG 1000ML POUR BTL (IV SOLUTION) ×2 IMPLANT
PACK SHOULDER (CUSTOM PROCEDURE TRAY) ×2 IMPLANT
PACK UNIVERSAL I (CUSTOM PROCEDURE TRAY) ×2 IMPLANT
PAD ARMBOARD 7.5X6 YLW CONV (MISCELLANEOUS) ×4 IMPLANT
PAD CAST 4YDX4 CTTN HI CHSV (CAST SUPPLIES) ×1 IMPLANT
PADDING CAST COTTON 4X4 STRL (CAST SUPPLIES) ×2
PEG LOCKING 3.2MMX44 (Peg) ×1 IMPLANT
PEG LOCKING 3.2X32 (Peg) ×1 IMPLANT
PEG LOCKING 3.2X38 (Screw) ×2 IMPLANT
PEG LOCKING 3.2X40 (Peg) ×1 IMPLANT
PEG LOCKING 3.2X42 (Screw) ×1 IMPLANT
PEG LOCKING 3.2X50 (Screw) ×1 IMPLANT
PLATE 3HOLE HUMERUS PROX RT (Plate) ×1 IMPLANT
SCREW LP NL T15 3.5X24 (Screw) ×2 IMPLANT
SCREW LP NL T15 3.5X26 (Screw) ×1 IMPLANT
SLING ARM IMMOBILIZER LRG (SOFTGOODS) ×2 IMPLANT
SPONGE LAP 18X18 X RAY DECT (DISPOSABLE) ×2 IMPLANT
STAPLER VISISTAT 35W (STAPLE) IMPLANT
SUCTION FRAZIER HANDLE 10FR (MISCELLANEOUS) ×1
SUCTION TUBE FRAZIER 10FR DISP (MISCELLANEOUS) IMPLANT
SUT ETHILON 3 0 PS 1 (SUTURE) ×6 IMPLANT
SUT FIBERWIRE #2 38 T-5 BLUE (SUTURE) ×2
SUT VIC AB 0 CT1 27 (SUTURE) ×2
SUT VIC AB 0 CT1 27XBRD ANBCTR (SUTURE) ×2 IMPLANT
SUT VIC AB 2-0 CT1 27 (SUTURE) ×4
SUT VIC AB 2-0 CT1 TAPERPNT 27 (SUTURE) ×2 IMPLANT
SUTURE FIBERWR #2 38 T-5 BLUE (SUTURE) IMPLANT
SYR CONTROL 10ML LL (SYRINGE) IMPLANT
TOWEL OR 17X24 6PK STRL BLUE (TOWEL DISPOSABLE) IMPLANT
TOWEL OR 17X26 10 PK STRL BLUE (TOWEL DISPOSABLE) ×2 IMPLANT
UNDERPAD 30X30 (UNDERPADS AND DIAPERS) ×2 IMPLANT
WATER STERILE IRR 1000ML POUR (IV SOLUTION) ×2 IMPLANT

## 2018-01-06 NOTE — Discharge Instructions (Signed)
Postoperative instructions: ° °Weightbearing instructions: non weight bearing ° °Dressing instructions: Keep your dressing and/or splint clean and dry at all times.  It will be removed at your first post-operative appointment.  Your stitches and/or staples will be removed at this visit. ° °Incision instructions:  Do not soak your incision for 3 weeks after surgery.  If the incision gets wet, pat dry and do not scrub the incision. ° °Pain control:  You have been given a prescription to be taken as directed for post-operative pain control.  In addition, elevate the operative extremity above the heart at all times to prevent swelling and throbbing pain. ° °Take over-the-counter Colace, 100mg by mouth twice a day while taking narcotic pain medications to help prevent constipation. ° °Follow up appointments: °1) 10-14 days for suture removal and wound check. °2) Dr. Manoj Enriquez as scheduled. ° ° ------------------------------------------------------------------------------------------------------------- ° °After Surgery Pain Control: ° °After your surgery, post-surgical discomfort or pain is likely. This discomfort can last several days to a few weeks. At certain times of the day your discomfort may be more intense.  °Did you receive a nerve block?  °A nerve block can provide pain relief for one hour to two days after your surgery. As long as the nerve block is working, you will experience little or no sensation in the area the surgeon operated on.  °As the nerve block wears off, you will begin to experience pain or discomfort. It is very important that you begin taking your prescribed pain medication before the nerve block fully wears off. Treating your pain at the first sign of the block wearing off will ensure your pain is better controlled and more tolerable when full-sensation returns. Do not wait until the pain is intolerable, as the medicine will be less effective. It is better to treat pain in advance than to try and  catch up.  °General Anesthesia:  °If you did not receive a nerve block during your surgery, you will need to start taking your pain medication shortly after your surgery and should continue to do so as prescribed by your surgeon.  °Pain Medication:  °Most commonly we prescribe Vicodin and Percocet for post-operative pain. Both of these medications contain a combination of acetaminophen (Tylenol®) and a narcotic to help control pain.  °· It takes between 30 and 45 minutes before pain medication starts to work. It is important to take your medication before your pain level gets too intense.  °· Nausea is a common side effect of many pain medications. You will want to eat something before taking your pain medicine to help prevent nausea.  °· If you are taking a prescription pain medication that contains acetaminophen, we recommend that you do not take additional over the counter acetaminophen (Tylenol®).  °Other pain relieving options:  °· Using a cold pack to ice the affected area a few times a day (15 to 20 minutes at a time) can help to relieve pain, reduce swelling and bruising.  °· Elevation of the affected area can also help to reduce pain and swelling. ° ° ° °

## 2018-01-06 NOTE — Anesthesia Preprocedure Evaluation (Addendum)
Anesthesia Evaluation  Patient identified by MRN, date of birth, ID band Patient awake    Reviewed: Allergy & Precautions, NPO status , Patient's Chart, lab work & pertinent test results  History of Anesthesia Complications (+) PONV and history of anesthetic complications  Airway Mallampati: II  TM Distance: >3 FB Neck ROM: Full    Dental  (+) Dental Advisory Given, Edentulous Upper, Partial Lower   Pulmonary asthma , COPD,  COPD inhaler, former smoker,    Pulmonary exam normal breath sounds clear to auscultation       Cardiovascular hypertension, Pt. on medications + angina Normal cardiovascular exam Rhythm:Regular Rate:Normal     Neuro/Psych  Headaches, PSYCHIATRIC DISORDERS Depression    GI/Hepatic Neg liver ROS, GERD  ,  Endo/Other  diabetes, Type 2, Insulin Dependent, Oral Hypoglycemic AgentsObesity   Renal/GU negative Renal ROS     Musculoskeletal  (+) Arthritis , right proximal humerus fracture    Abdominal   Peds  Hematology negative hematology ROS (+) Blood dyscrasia, anemia ,   Anesthesia Other Findings Day of surgery medications reviewed with the patient.  Reproductive/Obstetrics                           Anesthesia Physical Anesthesia Plan  ASA: III  Anesthesia Plan: General   Post-op Pain Management:  Regional for Post-op pain   Induction: Intravenous  PONV Risk Score and Plan: 4 or greater and Midazolam, Dexamethasone, Ondansetron and TIVA  Airway Management Planned: Oral ETT  Additional Equipment:   Intra-op Plan:   Post-operative Plan: Extubation in OR  Informed Consent: I have reviewed the patients History and Physical, chart, labs and discussed the procedure including the risks, benefits and alternatives for the proposed anesthesia with the patient or authorized representative who has indicated his/her understanding and acceptance.   Dental advisory  given  Plan Discussed with: CRNA  Anesthesia Plan Comments: (Block plus GA with ETT, TIVA with propofol given PONV history.)       Anesthesia Quick Evaluation

## 2018-01-06 NOTE — Anesthesia Procedure Notes (Signed)
Procedure Name: Intubation Date/Time: 01/06/2018 1:00 PM Performed by: Scheryl Darter, CRNA Pre-anesthesia Checklist: Patient identified, Emergency Drugs available, Suction available and Patient being monitored Patient Re-evaluated:Patient Re-evaluated prior to induction Oxygen Delivery Method: Circle System Utilized Preoxygenation: Pre-oxygenation with 100% oxygen Induction Type: IV induction Ventilation: Mask ventilation without difficulty Laryngoscope Size: Miller and 2 Grade View: Grade I Tube type: Oral Tube size: 7.0 mm Number of attempts: 1 Airway Equipment and Method: Stylet and Oral airway Placement Confirmation: ETT inserted through vocal cords under direct vision,  positive ETCO2 and breath sounds checked- equal and bilateral Secured at: 20 cm Tube secured with: Tape Dental Injury: Teeth and Oropharynx as per pre-operative assessment

## 2018-01-06 NOTE — Anesthesia Postprocedure Evaluation (Signed)
Anesthesia Post Note  Patient: Abigail Collins  Procedure(s) Performed: OPEN REDUCTION INTERNAL FIXATION (ORIF) RIGHT PROXIMAL HUMERUS FRACTURE (Right Arm Upper)     Patient location during evaluation: PACU Anesthesia Type: General Level of consciousness: awake and alert Pain management: pain level controlled Vital Signs Assessment: post-procedure vital signs reviewed and stable Respiratory status: spontaneous breathing, nonlabored ventilation and respiratory function stable Cardiovascular status: blood pressure returned to baseline and stable Postop Assessment: no apparent nausea or vomiting Anesthetic complications: no    Last Vitals:  Vitals:   01/06/18 1836 01/06/18 2022  BP: (!) 143/83 124/69  Pulse: (!) 116 (!) 133  Resp:  16  Temp: 36.7 C 36.8 C  SpO2: 90% 92%    Last Pain:  Vitals:   01/06/18 2022  TempSrc: Oral  PainSc:                  Audry Pili

## 2018-01-06 NOTE — Op Note (Signed)
   Date of Surgery: 01/06/2018  INDICATIONS: Ms. Roads is a 67 y.o.-year-old female with a displaced 4 part proximal humerus fracture;  The patient did consent to the procedure after discussion of the risks and benefits.  PREOPERATIVE DIAGNOSIS: Displaced 4 part proximal humerus fracture  POSTOPERATIVE DIAGNOSIS: Same.  PROCEDURE: Open reduction internal fixation of right 4 part proximal humerus fracture  SURGEON: N. Eduard Roux, M.D.  ASSIST: Ciro Backer [place], [place]; necessary for the timely completion of procedure and due to complexity of procedure.  ANESTHESIA:  general, regional  IV FLUIDS AND URINE: See anesthesia.  ESTIMATED BLOOD LOSS: 100 mL.  IMPLANTS: Biomet proximal humerus plate  DRAINS: none  COMPLICATIONS: None.  DESCRIPTION OF PROCEDURE: The patient was brought to the operating room and placed supine on the operating table.  The patient had been signed prior to the procedure and this was documented. The patient had the anesthesia placed by the anesthesiologist.  A time-out was performed to confirm that this was the correct patient, site, side and location. The patient did receive antibiotics prior to the incision and was re-dosed during the procedure as needed at indicated intervals. The patient had the operative extremity prepped and draped in the standard surgical fashion.    An anterolateral acromial approach to the proximal humerus was used.  Incision was made through the skin.  Dissection was carried through the subcutaneous fat.  The deltoid muscle was then exposed.  Full-thickness flaps were elevated.  Blunt dissection was then carefully performed through the fibers of the deltoid muscle.  A branch of the axillary nerve was identified and mobilized and retracted.  Retractors were then placed for exposure.  The fracture was then exposed and the fracture hematoma was evacuated.  Soft tissue adhesions were released.  Reduction and overall alignment of the proximal  humerus fracture was then achieved.  A proximal humerus sideplate was then placed at the appropriate position using fluoroscopic guidance.  The plate was provisionally placed and fixed to the side of the proximal humerus using a couple of K wires.  I then placed 2 nonlocking 3.5 millimeter screws through the distal portion of the plate into the humeral shaft with excellent fixation.  After this was done we then placed a series of locking pegs into the humeral head using fluoroscopy to confirm no intra-articular penetration.  After this was done we then placed 2 calcar locking pegs through the plate.  We then placed a third nonlocking shaft screw through the distal portion of the plate through the humeral shaft again with excellent fixation.  I then used a #2 FiberWire to tie the greater tuberosity down to the proximal portion of the plate using the premade holes.  The entire proximal humerus moved as a unit when taken through a gentle range of motion.  Final x-rays were taken.  The wound was then thoroughly irrigated.  2 g of topical tranexamic acid was placed in the surgical wound.  Hemostasis was obtained.  Surgical wound was closed in a layered fashion using 0 Vicryl, 2-0 Vicryl, 3-0 nylon.  Sterile dressings were applied.  Shoulder was placed in a shoulder sling.  Patient tolerated procedure well had no immediate complications.  POSTOPERATIVE PLAN: Patient will be admitted overnight for pain control and observation.  She will be evaluated by Occupational Therapy.  Azucena Cecil, MD [place] 1:58 PM

## 2018-01-06 NOTE — Transfer of Care (Signed)
Im mediate Anesthesia Transfer of Care Note  Patient: Abigail Collins  Procedure(s) Performed: OPEN REDUCTION INTERNAL FIXATION (ORIF) RIGHT PROXIMAL HUMERUS FRACTURE (Right Arm Upper)  Patient Location: PACU  Anesthesia Type:General and Regional  Level of Consciousness: awake, alert , oriented and sedated  Airway & Oxygen Therapy: Patient Spontanous Breathing and Patient connected to nasal cannula oxygen  Post-op Assessment: Report given to RN, Post -op Vital signs reviewed and stable and Patient moving all extremities  Post vital signs: Reviewed and stable  Last Vitals:  Vitals Value Taken Time  BP 129/84 01/06/2018  2:41 PM  Temp    Pulse 87 01/06/2018  2:47 PM  Resp 17 01/06/2018  2:47 PM  SpO2 96 % 01/06/2018  2:47 PM  Vitals shown include unvalidated device data.  Last Pain:  Vitals:   01/06/18 1440  TempSrc:   PainSc: Asleep      Patients Stated Pain Goal: 2 (20/94/70 9628)  Complications: No apparent anesthesia complications

## 2018-01-06 NOTE — H&P (Signed)
PREOPERATIVE H&P  Chief Complaint: right proximal humerus fracture  HPI: Abigail Collins is a 67 y.o. female who presents for surgical treatment of right proximal humerus fracture.  She denies any changes in medical history.  Past Medical History:  Diagnosis Date  . Anemia    hx of  . Anginal pain ([place])    "left arm pain, sees Dr. Etter Sjogren, had card cath 2013"  . Arthritis    "all over" (09/14/2012)  . Asthma    takes inhaler 2x day  . Bronchitis    hx of  . Bulging disc    "lower"  . Carpal tunnel syndrome of left wrist   . Depression    denies  . Exertional dyspnea    "sometimes laying down" (09/14/2012)  . Headache    frequent headaches,usually if she does not eat  . Hyperlipidemia   . Hypertension    sees Dr. Debby Freiberg, primary  . Pneumonia 03/2012  . PONV (postoperative nausea and vomiting)   . Thyroid disease 1960's   "don't have it now" (09/14/2012)  . Type II diabetes mellitus ([place])   . Urinary tract infection    hx of  . Vomiting    pt states she vomits every am   Past Surgical History:  Procedure Laterality Date  . CARDIAC CATHETERIZATION  05/13/2012   mod luminal irregularity of pLAD, no sign CAD, EF 65%.  . CHOLECYSTECTOMY  1970's  . KYPHOPLASTY N/A 02/10/2013   Procedure:  LUMBAR TWO KYPHOPLASTY;  Surgeon: Ophelia Charter, MD;  Location: [facility];  Service: Neurosurgery;  Laterality: N/A;  L2 Kyphoplasty; Will use Stern's Carm.  Marland Kitchen TOTAL ELBOW REPLACEMENT  ~ 2005   "right" (09/14/2012)  . TOTAL KNEE ARTHROPLASTY  09/13/2012   Procedure: TOTAL KNEE ARTHROPLASTY;  Surgeon: Vickey Huger, MD;  Location: [facility];  Service: Orthopedics;  Laterality: Right;  . TUBAL LIGATION  1970's  . VAGINAL HYSTERECTOMY  1970's   Social History   Socioeconomic History  . Marital status: Married    Spouse name: Not on file  . Number of children: Not on file  . Years of education: Not on file  . Highest education level: Not on file  Occupational History  . Not  on file  Social Needs  . Financial resource strain: Not on file  . Food insecurity:    Worry: Not on file    Inability: Not on file  . Transportation needs:    Medical: Not on file    Non-medical: Not on file  Tobacco Use  . Smoking status: Former Smoker    Packs/day: 0.25    Years: 20.00    Pack years: 5.00    Types: Cigarettes    Last attempt to quit: 10/13/2009    Years since quitting: 8.2  . Smokeless tobacco: Never Used  Substance and Sexual Activity  . Alcohol use: No    Comment: 09/14/2012 "did drink a little in my younger days"  . Drug use: No  . Sexual activity: Not Currently  Lifestyle  . Physical activity:    Days per week: Not on file    Minutes per session: Not on file  . Stress: Not on file  Relationships  . Social connections:    Talks on phone: Not on file    Gets together: Not on file    Attends religious service: Not on file    Active member of club or organization: Not on file    Attends  meetings of clubs or organizations: Not on file    Relationship status: Not on file  Other Topics Concern  . Not on file  Social History Narrative  . Not on file   Family History  Problem Relation Age of Onset  . Arthritis Mother   . Arthritis Father   . Hypertension Father   . Diabetes Father   . Colon cancer Neg Hx    Allergies  Allergen Reactions  . Lisinopril Shortness Of Breath  . Amlodipine Other (See Comments)    edema  . Tape Other (See Comments)    Tore skin--?hypofix  . Morphine And Related Nausea And Vomiting  . Invokana [Canagliflozin] Other (See Comments)    Yeast infections  . Percocet [Oxycodone-Acetaminophen] Nausea And Vomiting   Prior to Admission medications   Medication Sig Start Date End Date Taking? Authorizing Provider  aspirin-sod bicarb-citric acid (ALKA-SELTZER ORIGINAL) 325 MG TBEF tablet Take 325 mg by mouth every 6 (six) hours as needed (for stomach).   Yes [provider]  HYDROcodone-acetaminophen (NORCO/VICODIN)  5-325 MG tablet Take 1-2 tablets by mouth every 6 (six) hours as needed for severe pain. 12/27/17  Yes Mesner, Corene Cornea, MD  Insulin Glargine (TOUJEO SOLOSTAR) 300 UNIT/ML SOPN Inject 50 Units into the skin daily. 09/25/17  Yes Janith Lima, MD  irbesartan (AVAPRO) 300 MG tablet Take 1 tablet (300 mg total) by mouth daily. 12/01/17  Yes Janith Lima, MD  metFORMIN (GLUCOPHAGE) 1000 MG tablet Take 1 tablet (1,000 mg total) by mouth 2 (two) times daily with a meal. 12/01/17  Yes Janith Lima, MD  Semaglutide (OZEMPIC) 0.25 or 0.5 MG/DOSE SOPN Inject 0.5 mg into the skin once a week. Patient taking differently: Inject 0.5 mg into the skin every Wednesday.  07/01/17  Yes Janith Lima, MD  albuterol (PROVENTIL) (2.5 MG/3ML) 0.083% nebulizer solution Inhale 2.5 mg into the lungs every 6 (six) hours as needed for wheezing or shortness of breath. Reported on 01/17/2016    [provider]  atorvastatin (LIPITOR) 20 MG tablet Take 1 tablet (20 mg total) by mouth daily. Patient not taking: Reported on 01/01/2018 12/01/17   Janith Lima, MD  Blood Glucose Monitoring Suppl (ONETOUCH VERIO IQ SYSTEM) W/DEVICE KIT 1 Act by Does not apply route 3 (three) times daily. Patient not taking: Reported on 01/01/2018 09/03/15   Janith Lima, MD  budesonide-formoterol [facility]) 80-4.5 MCG/ACT inhaler Inhale 2 puffs into the lungs 2 (two) times daily. Patient not taking: Reported on 01/01/2018 01/09/16   Janith Lima, MD  chlorthalidone (HYGROTON) 25 MG tablet Take 1 tablet (25 mg total) by mouth daily. Patient not taking: Reported on 01/01/2018 05/26/17   Janith Lima, MD  Insulin Pen Needle (NOVOFINE) 32G X 6 MM MISC 1 Act by Does not apply route daily. Patient not taking: Reported on 01/01/2018 05/27/16   Janith Lima, MD  levocetirizine (XYZAL) 5 MG tablet Take 1 tablet (5 mg total) by mouth every evening. Patient not taking: Reported on 01/01/2018 07/05/17   Janith Lima, MD  omega-3 acid ethyl  esters (LOVAZA) 1 g capsule Take 2 capsules (2 g total) by mouth 2 (two) times daily. Patient not taking: Reported on 01/01/2018 05/27/17   Janith Lima, MD  [facility] VERIO test strip TEST 3 TIMES A DAY Patient not taking: Reported on 01/01/2018 04/06/17   Janith Lima, MD     Positive ROS: All other systems have been reviewed and were otherwise  negative with the exception of those mentioned in the HPI and as above.  Physical Exam: General: Alert, no acute distress Cardiovascular: No pedal edema Respiratory: No cyanosis, no use of accessory musculature GI: abdomen soft Skin: No lesions in the area of chief complaint Neurologic: Sensation intact distally Psychiatric: Patient is competent for consent with normal mood and affect Lymphatic: no lymphedema  MUSCULOSKELETAL: exam stable  Assessment: right proximal humerus fracture  Plan: Plan for Procedure(s): OPEN REDUCTION INTERNAL FIXATION (ORIF) RIGHT PROXIMAL HUMERUS FRACTURE  The risks benefits and alternatives were discussed with the patient including but not limited to the risks of nonoperative treatment, versus surgical intervention including infection, bleeding, nerve injury,  blood clots, cardiopulmonary complications, morbidity, mortality, among others, and they were willing to proceed.   Eduard Roux, MD   01/06/2018 10:57 AM

## 2018-01-06 NOTE — Anesthesia Procedure Notes (Signed)
Anesthesia Regional Block: Interscalene brachial plexus block   Pre-Anesthetic Checklist: ,, timeout performed, Correct Patient, Correct Site, Correct Laterality, Correct Procedure, Correct Position, site marked, Risks and benefits discussed,  Surgical consent,  Pre-op evaluation,  At surgeon's request and post-op pain management  Laterality: Right  Prep: chloraprep       Needles:  Injection technique: Single-shot  Needle Type: Echogenic Needle     Needle Length: 9cm  Needle Gauge: 21     Additional Needles:   Procedures:,,,, ultrasound used (permanent image in chart),,,,  Narrative:  Start time: 01/06/2018 11:42 AM End time: 01/06/2018 11:47 AM Injection made incrementally with aspirations every 5 mL.  Performed by: Personally  Anesthesiologist: Catalina Gravel, MD  Additional Notes: No pain on injection. No increased resistance to injection. Injection made in 5cc increments.  Good needle visualization.  Patient tolerated procedure well.

## 2018-01-07 ENCOUNTER — Encounter (HOSPITAL_COMMUNITY): Payer: Self-pay | Admitting: Orthopaedic Surgery

## 2018-01-07 DIAGNOSIS — S42201A Unspecified fracture of upper end of right humerus, initial encounter for closed fracture: Secondary | ICD-10-CM | POA: Diagnosis not present

## 2018-01-07 LAB — GLUCOSE, CAPILLARY
Glucose-Capillary: 153 mg/dL — ABNORMAL HIGH (ref 65–99)
Glucose-Capillary: 115 mg/dL — ABNORMAL HIGH (ref 65–99)
Glucose-Capillary: 134 mg/dL — ABNORMAL HIGH (ref 65–99)
Glucose-Capillary: 193 mg/dL — ABNORMAL HIGH (ref 65–99)

## 2018-01-07 LAB — COMPREHENSIVE METABOLIC PANEL
ALT: 37 U/L (ref 14–54)
AST: 50 U/L — ABNORMAL HIGH (ref 15–41)
Albumin: 3.6 g/dL (ref 3.5–5.0)
Alkaline Phosphatase: 76 U/L (ref 38–126)
Anion gap: 10 (ref 5–15)
BUN: 18 mg/dL (ref 6–20)
CO2: 26 mmol/L (ref 22–32)
Calcium: 9.4 mg/dL (ref 8.9–10.3)
Chloride: 106 mmol/L (ref 101–111)
Creatinine, Ser: 0.84 mg/dL (ref 0.44–1.00)
GFR calc Af Amer: >60 mL/min (ref >60)
GFR calc non Af Amer: >60 mL/min (ref >60)
Glucose, Bld: 154 mg/dL — ABNORMAL HIGH (ref 65–99)
Potassium: 3.9 mmol/L (ref 3.5–5.1)
Sodium: 142 mmol/L (ref 135–145)
Total Bilirubin: 0.6 mg/dL (ref 0.3–1.2)
Total Protein: 6.6 g/dL (ref 6.5–8.1)

## 2018-01-07 NOTE — Plan of Care (Signed)

## 2018-01-07 NOTE — Progress Notes (Signed)
Occupational Therapy Treatment Patient Details Name: Abigail Collins MRN: [id number] DOB: [birth year] Today's Date: 01/07/2018    History of present illness s/p ORIF of right 4 part proximal humerus fracture. PMH includes: HTN, R TKA, R TOTAL ELBOW REPLACEMENT, KYPHOPLASTY, Carpal tunnel syndrome of left wrist.    OT comments  Pt progressing towards acute OT goals. Focus of session was bathing/dressing. Educated on sling wearing schedule, precautions, and e/w/h ROM exercises. Per phone conversation with Tawanna Cooler, Dr. Phoebe Sharps PA, pt is to ok to come out of sling for bathing/dressing and e/w/h ROM exercises. No AROM of R shoulder. NWB RUE. D/c plan remains appropriate.    Follow Up Recommendations  Follow surgeon's recommendation for DC plan and follow-up therapies    Equipment Recommendations  None recommended by OT    Recommendations for Other Services PT consult    Precautions / Restrictions Precautions Precautions: (No AROM R shoulder. E/w/hand exercises are ok.) Precaution Comments: sling at all times except bathing/dressing/exercises of e/w/h Required Braces or Orthoses: Sling Restrictions Weight Bearing Restrictions: Yes RUE Weight Bearing: Non weight bearing       Mobility Bed Mobility Overal bed mobility: Needs Assistance Bed Mobility: Supine to Sit     Supine to sit: Min assist;HOB elevated Sit to supine: Mod assist;HOB elevated   General bed mobility comments: steadying assist  Transfers Overall transfer level: Needs assistance Equipment used: None Transfers: Sit to/from Stand Sit to Stand: Min guard;Min assist;From elevated surface         General transfer comment: Pt requesting elevated bed height. Pt declined SPC during transfer, only used once mobilizing. Steadying assist on intial stand and standing from lower seat position.     Balance Overall balance assessment: Mild deficits observed, not formally tested                                          ADL either performed or assessed with clinical judgement   ADL Overall ADL's : Needs assistance/impaired Eating/Feeding: Set up;Sitting   Grooming: Minimal assistance;Sitting;Cueing for compensatory techniques   Upper Body Bathing: Moderate assistance;Cueing for compensatory techniques;Cueing for UE precautions;Sitting   Lower Body Bathing: Moderate assistance;Cueing for compensatory techniques;Sit to/from stand   Upper Body Dressing : Moderate assistance;Sitting;Cueing for compensatory techniques   Lower Body Dressing: Sit to/from stand;Moderate assistance;Cueing for compensatory techniques   Toilet Transfer: Minimal assistance;Ambulation(SPC)   Toileting- Clothing Manipulation and Hygiene: Minimal assistance;Sit to/from stand;Sitting/lateral lean;Set up(SPC)     Tub/Shower Transfer Details (indicate cue type and reason): has been sponge bathing due to RUE and L knee deficits Functional mobility during ADLs: Min guard;Cane General ADL Comments: Pt completed UB/LB bathing and dressing and grooming tasks sitting at sink. Educated on sling wearing schedule.      Vision       Perception     Praxis      Cognition Arousal/Alertness: Awake/alert Behavior During Therapy: WFL for tasks assessed/performed Overall Cognitive Status: Within Functional Limits for tasks assessed                                          Exercises Exercises: Other exercises Other Exercises Other Exercises: educated and demonstrated e/w/h ROM exercises.    Shoulder Instructions       General Comments  Pertinent Vitals/ Pain       Pain Assessment: Faces Faces Pain Scale: Hurts little more Pain Location: RUE, L knee with mobility Pain Descriptors / Indicators: Aching;Sore Pain Intervention(s): Limited activity within patient's tolerance;Monitored during session;Repositioned  Home Living Family/patient expects to be discharged to:: Private  residence Living Arrangements: Spouse/significant other Available Help at Discharge: Family;Available PRN/intermittently Type of Home: House Home Access: Stairs to enter [employer] of Steps: 3   Home Layout: One level     Bathroom Shower/Tub: Corporate investment banker: Handicapped height     Home Equipment: Bedside commode;Cane - single point;Hand held shower head          Prior Functioning/Environment Level of Independence: Independent with assistive device(s)            Frequency  Min 3X/week        Progress Toward Goals  OT Goals(current goals can now be found in the care plan section)     Acute Rehab OT Goals Patient Stated Goal: regain independence.   OT Goal Formulation: With patient Time For Goal Achievement: 01/14/18 Potential to Achieve Goals: Good ADL Goals Pt Will Perform Grooming: with modified independence;sitting Pt Will Perform Upper Body Bathing: with min guard assist;sitting Pt Will Perform Lower Body Bathing: with min guard assist;sit to/from stand Pt Will Perform Upper Body Dressing: with supervision;sitting Pt Will Perform Lower Body Dressing: with min guard assist;sit to/from stand Pt Will Transfer to Toilet: with supervision;ambulating Pt Will Perform Toileting - Clothing Manipulation and hygiene: with supervision;sit to/from stand Additional ADL Goal #1: Pt will be independent with e/w/h ROM exercises.  Plan Discharge plan remains appropriate    Co-evaluation                 AM-PAC PT "6 Clicks" Daily Activity     Outcome Measure   Help from another person eating meals?: None Help from another person taking care of personal grooming?: A Lot Help from another person toileting, which includes using toliet, bedpan, or urinal?: A Little Help from another person bathing (including washing, rinsing, drying)?: A Lot Help from another person to put on and taking off regular upper body clothing?: A  Lot Help from another person to put on and taking off regular lower body clothing?: A Little 6 Click Score: 16    End of Session Equipment Utilized During Treatment: Other (comment)(sling, SPC)  OT Visit Diagnosis: Unsteadiness on feet (R26.81);Pain;Other abnormalities of gait and mobility (R26.89)   Activity Tolerance Patient tolerated treatment well   Patient Left in bed;with call bell/phone within reach   Nurse Communication          Time: 6812-7517 OT Time Calculation (min): 47 min  Charges: OT General Charges $OT Visit: 1 Visit OT Evaluation $OT Eval Low Complexity:  OT Treatments $Self Care/Home Management : 38-52 mins     Hortencia Pilar 01/07/2018, 12:14 PM

## 2018-01-07 NOTE — Progress Notes (Signed)
Subjective: 1 Day Post-Op Procedure(s) (LRB): OPEN REDUCTION INTERNAL FIXATION (ORIF) RIGHT PROXIMAL HUMERUS FRACTURE (Right) Patient reports pain as mild.  Doing well this am.    Objective: Vital signs in last 24 hours: Temp:  [97.7 F (36.5 C)-98.3 F (36.8 C)] 97.7 F (36.5 C) (03/28 0418) Pulse Rate:  [80-133] 86 (03/28 0418) Resp:  [15-38] 16 (03/28 0418) BP: (118-164)/(69-109) 129/75 (03/28 0418) SpO2:  [89 %-99 %] 95 % (03/28 0418) Weight:  [216 lb (98 kg)] 216 lb (98 kg) (03/27 1016)  Intake/Output from previous day: 03/27 0701 - 03/28 0700 In: 2245.8 [P.O.:240; I.V.:1705.8; IV Piggyback:300] Out: 575 [Urine:500; Blood:75] Intake/Output this shift: No intake/output data recorded.  Recent Labs    01/06/18 1107  HGB 12.0   Recent Labs    01/06/18 1107  WBC 9.4  RBC 4.00  HCT 37.9  PLT 276   Recent Labs    01/06/18 1107 01/07/18 0502  NA 139 142  K 3.6 3.9  CL 102 106  CO2 25 26  BUN 15 18  CREATININE 0.63 0.84  GLUCOSE 136* 154*  CALCIUM 9.8 9.4   No results for input(s): LABPT, INR in the last 72 hours.  Neurologically intact Neurovascular intact Sensation intact distally Intact pulses distally Dorsiflexion/Plantar flexion intact Incision: dressing C/D/I No cellulitis present Compartment soft  Assessment/Plan: 1 Day Post-Op Procedure(s) (LRB): OPEN REDUCTION INTERNAL FIXATION (ORIF) RIGHT PROXIMAL HUMERUS FRACTURE (Right) Advance diet Up with therapy Plan for discharge tomorrow  Up with OT today RUE-sling at all times.  NWB RUE Will need to mobilize well before d/c home Dry dressing change prn   Aundra Dubin 01/07/2018, 7:40 AM

## 2018-01-07 NOTE — Evaluation (Signed)
Physical Therapy Evaluation Patient Details Name: Abigail Collins MRN: [id number] DOB: [birth year] Today's Date: 01/07/2018   History of Present Illness  Pt is a 67 y/o female s/p ORIF of R proximal humerus fx. PMH includes HTN, COPD, asthma, DM, kyphoplasty, and R TKA.   Clinical Impression  Pt s/p surgery above with deficits below. Pt unsteady during gait, requiring min guard A with use of cane. Pt reports mild unsteadiness at baseline secondary to L knee deficits. Required min guard for stair navigation as well. Reports husband available over weekend for 24/7 support. Will continue to follow acutely to maximize functional mobility independence and safety.     Follow Up Recommendations Home health PT;Supervision for mobility/OOB    Equipment Recommendations  None recommended by PT    Recommendations for Other Services       Precautions / Restrictions Precautions Precautions: Shoulder Precaution Comments: No AROM R shoulder. E/w/hand exercises are ok.. Sling at all times except bathing/dressing/exercises of e/w/h Required Braces or Orthoses: Sling Restrictions Weight Bearing Restrictions: Yes RUE Weight Bearing: Non weight bearing      Mobility  Bed Mobility Overal bed mobility: Needs Assistance Bed Mobility: Supine to Sit;Sit to Supine     Supine to sit: Min guard;HOB elevated Sit to supine: Supervision   General bed mobility comments: Min guard for safety for coming up to sitting. Supervision for safety for return to supine.   Transfers Overall transfer level: Needs assistance Equipment used: None Transfers: Sit to/from Stand Sit to Stand: Min guard;From elevated surface         General transfer comment: Min guard for safety from elevated surface.   Ambulation/Gait Ambulation/Gait assistance: Min guard Ambulation Distance (Feet): 200 Feet Assistive device: Straight cane Gait Pattern/deviations: Step-through pattern;Decreased stride length;Decreased weight  shift to left;Antalgic Gait velocity: Decreased  Gait velocity interpretation: Below normal speed for age/gender General Gait Details: Slow, antalgic gait secondary to L knee pain. Pt with pain at baseline. Unable to use cane in R hand secondary to NWB, therefore used in LUE.   Stairs Stairs: Yes Stairs assistance: Min guard Stair Management: Forwards;One rail Right;With cane;Step to pattern Number of Stairs: 2 General stair comments: Used cane for ascending. Mild unsteadiness requiring min guard A. Used rail on L when descending steps (says she has at home). Min guard for safety. Demonstrated good technique for ascending and descending stairs.   Wheelchair Mobility    Modified Rankin (Stroke Patients Only)       Balance Overall balance assessment: Needs assistance Sitting-balance support: No upper extremity supported;Feet supported Sitting balance-Leahy Scale: Good     Standing balance support: Single extremity supported;During functional activity Standing balance-Leahy Scale: Poor Standing balance comment: Reliant on LUE support during ambulation.                              Pertinent Vitals/Pain Pain Assessment: Faces Faces Pain Scale: Hurts little more Pain Location: RUE, L knee with mobility Pain Descriptors / Indicators: Aching;Sore Pain Intervention(s): Limited activity within patient's tolerance;Monitored during session;Repositioned    Home Living Family/patient expects to be discharged to:: Private residence Living Arrangements: Spouse/significant other Available Help at Discharge: Family;Available PRN/intermittently Type of Home: House Home Access: Stairs to enter Entrance Stairs-Rails: Right Entrance Stairs-Number of Steps: 3 Home Layout: One level Home Equipment: Bedside commode;Cane - single point;Hand held shower head      Prior Function Level of Independence: Independent with assistive device(s)  Comments: Used cane for  ambulation      Hand Dominance   Dominant Hand: Right    Extremity/Trunk Assessment   Upper Extremity Assessment Upper Extremity Assessment: Defer to OT evaluation(RUE in sling throughout )    Lower Extremity Assessment Lower Extremity Assessment: LLE deficits/detail LLE Deficits / Details: reporting L knee pain and instability with mobility. Pt reports she is planing to have knee surgery as soon as she can. Functional weakness noted.        Communication   Communication: No difficulties  Cognition Arousal/Alertness: Awake/alert Behavior During Therapy: WFL for tasks assessed/performed Overall Cognitive Status: Within Functional Limits for tasks assessed                                        General Comments      Exercises     Assessment/Plan    PT Assessment Patient needs continued PT services  PT Problem List Decreased strength;Decreased balance;Decreased mobility;Decreased knowledge of use of DME;Decreased knowledge of precautions;Pain       PT Treatment Interventions DME instruction;Gait training;Stair training;Therapeutic activities;Functional mobility training;Balance training;Therapeutic exercise;Neuromuscular re-education;Patient/family education    PT Goals (Current goals can be found in the Care Plan section)  Acute Rehab PT Goals Patient Stated Goal: regain independence.   PT Goal Formulation: With patient Time For Goal Achievement: 01/21/18 Potential to Achieve Goals: Good    Frequency Min 5X/week   Barriers to discharge        Co-evaluation               AM-PAC PT "6 Clicks" Daily Activity  Outcome Measure Difficulty turning over in bed (including adjusting bedclothes, sheets and blankets)?: A Little Difficulty moving from lying on back to sitting on the side of the bed? : Unable Difficulty sitting down on and standing up from a chair with arms (e.g., wheelchair, bedside commode, etc,.)?: Unable Help needed moving to  and from a bed to chair (including a wheelchair)?: A Little Help needed walking in hospital room?: A Little Help needed climbing 3-5 steps with a railing? : A Little 6 Click Score: 14    End of Session Equipment Utilized During Treatment: Gait belt;Other (comment)(sling RUE ) Activity Tolerance: Patient tolerated treatment well Patient left: in bed;with call bell/phone within reach Nurse Communication: Mobility status PT Visit Diagnosis: Unsteadiness on feet (R26.81);Other abnormalities of gait and mobility (R26.89);Pain Pain - Right/Left: Right Pain - part of body: Shoulder    Time: 1346-1410 PT Time Calculation (min) (ACUTE ONLY): 24 min   Charges:   PT Evaluation $PT Eval Low Complexity: 1 Low PT Treatments $Gait Training: 8-22 mins   PT G Codes:        Leighton Ruff, PT, DPT  Acute Rehabilitation Services  Pager: [phone]   Rudean Hitt 01/07/2018, 4:14 PM

## 2018-01-07 NOTE — Evaluation (Signed)
Occupational Therapy Evaluation Patient Details Name: Abigail Collins MRN: [id number] DOB: 1951-10-13 Today's Date: 01/07/2018    History of Present Illness s/p ORIF of right 4 part proximal humerus fracture. PMH includes: HTN, R TKA, R TOTAL ELBOW REPLACEMENT, KYPHOPLASTY, Carpal tunnel syndrome of left wrist.    Clinical Impression   Pt admitted with the above diagnoses and presents with below problem list. Pt will benefit from continued acute OT to address the below listed deficits and maximize independence with ADLs prior to d/c home. PTA pt was mod I with ADLs (SPC) and has recently been sponge bathing in lieu of tub transfer. Pt is currently min to mod A with most ADLs. Plan to seek clarification from ortho regarding ROM restrictions and sling wearing schedule. Plan to see for second session to address bathing/dressing which pt is eager to complete. Pt would benefit from PT consult.      Follow Up Recommendations  Follow surgeon's recommendation for DC plan and follow-up therapies    Equipment Recommendations  None recommended by OT    Recommendations for Other Services  PT consult     Precautions / Restrictions Precautions Required Braces or Orthoses: Sling Restrictions Weight Bearing Restrictions: Yes RUE Weight Bearing: Non weight bearing      Mobility Bed Mobility Overal bed mobility: Needs Assistance Bed Mobility: Sit to Supine       Sit to supine: Mod assist;HOB elevated   General bed mobility comments: Assist to advance BLE.  Transfers Overall transfer level: Needs assistance Equipment used: None Transfers: Sit to/from Stand Sit to Stand: Min guard;Min assist;From elevated surface         General transfer comment: Pt requesting elevated bed height. Pt declined SPC during transfer, only used once mobilizing. Steadying assist on intial stand and standing from lower seat position.     Balance Overall balance assessment: Mild deficits observed, not  formally tested                                         ADL either performed or assessed with clinical judgement   ADL Overall ADL's : Needs assistance/impaired Eating/Feeding: Set up;Sitting   Grooming: Sitting;Moderate assistance   Upper Body Bathing: Moderate assistance   Lower Body Bathing: Moderate assistance;Sit to/from stand   Upper Body Dressing : Moderate assistance;Sitting   Lower Body Dressing: Sit to/from stand;Moderate assistance   Toilet Transfer: Minimal assistance;Ambulation(SPC)   Toileting- Clothing Manipulation and Hygiene: Minimal assistance;Sit to/from stand;Sitting/lateral lean;Set up(SPC)     Tub/Shower Transfer Details (indicate cue type and reason): has been sponge bathing due to RUE and L knee deficits Functional mobility during ADLs: Min guard;Cane General ADL Comments: Pt completed in-room functional mobility. Discussed sling positioning and NWB precautions. Pt eager to wash up and dress. OT to seek clarifcation on order set for RUE prior to instructing in UB ADL techniques.      Vision         Perception     Praxis      Pertinent Vitals/Pain Pain Assessment: Faces Faces Pain Scale: Hurts little more Pain Location: RUE, L knee with mobility Pain Descriptors / Indicators: Aching;Sore Pain Intervention(s): Limited activity within patient's tolerance;Monitored during session     Hand Dominance Right   Extremity/Trunk Assessment Upper Extremity Assessment Upper Extremity Assessment: RUE deficits/detail RUE Deficits / Details: s/p ORIF proximal humerus fx RUE: Unable to fully assess due  to immobilization   Lower Extremity Assessment Lower Extremity Assessment: Defer to PT evaluation;LLE deficits/detail LLE Deficits / Details: reporting L knee pain and instability with mobility. Pt reports she is plaaning to have knee surgery as soon as she can.       Communication Communication Communication: No difficulties    Cognition Arousal/Alertness: Awake/alert Behavior During Therapy: WFL for tasks assessed/performed Overall Cognitive Status: Within Functional Limits for tasks assessed                                     General Comments       Exercises     Shoulder Instructions      Home Living Family/patient expects to be discharged to:: Private residence Living Arrangements: Spouse/significant other Available Help at Discharge: Family;Available PRN/intermittently Type of Home: House Home Access: Stairs to enter [employer] of Steps: 3   Home Layout: One level     Bathroom Shower/Tub: Tub/shower unit;Curtain   Biochemist, clinical: Handicapped height     Home Equipment: Bedside commode;Cane - single point;Hand held shower head          Prior Functioning/Environment Level of Independence: Independent with assistive device(s)                 OT Problem List: Impaired balance (sitting and/or standing);Decreased knowledge of use of DME or AE;Decreased knowledge of precautions;Pain;Impaired UE functional use      OT Treatment/Interventions: Self-care/ADL training;Therapeutic exercise;DME and/or AE instruction;Therapeutic activities;Patient/family education;Balance training    OT Goals(Current goals can be found in the care plan section) Acute Rehab OT Goals Patient Stated Goal: regain independence.   OT Goal Formulation: With patient Time For Goal Achievement: 01/14/18 Potential to Achieve Goals: Good ADL Goals Pt Will Perform Grooming: with modified independence;sitting Pt Will Perform Upper Body Bathing: with min guard assist;sitting Pt Will Perform Lower Body Bathing: with min guard assist;sit to/from stand Pt Will Perform Upper Body Dressing: with supervision;sitting Pt Will Perform Lower Body Dressing: with min guard assist;sit to/from stand Pt Will Transfer to Toilet: with supervision;ambulating Pt Will Perform Toileting - Clothing Manipulation  and hygiene: with supervision;sit to/from stand Additional ADL Goal #1: Pt will be independent with e/w/h ROM exercises.  OT Frequency: Min 3X/week   Barriers to D/C:            Co-evaluation              AM-PAC PT "6 Clicks" Daily Activity     Outcome Measure Help from another person eating meals?: None Help from another person taking care of personal grooming?: A Lot Help from another person toileting, which includes using toliet, bedpan, or urinal?: A Little Help from another person bathing (including washing, rinsing, drying)?: A Lot Help from another person to put on and taking off regular upper body clothing?: A Lot Help from another person to put on and taking off regular lower body clothing?: A Little 6 Click Score: 16   End of Session Equipment Utilized During Treatment: Other (comment)(sling, SPC)  Activity Tolerance: Patient tolerated treatment well Patient left: in bed;with call bell/phone within reach  OT Visit Diagnosis: Unsteadiness on feet (R26.81);Pain;Other abnormalities of gait and mobility (R26.89)                Time: 0920-1000 OT Time Calculation (min): 40 min Charges:  OT General Charges $OT Visit: 1 Visit OT Evaluation $OT Eval Low Complexity: 1 Low  OT Treatments $Self Care/Home Management : 8-22 mins G-Codes:       Hortencia Pilar 01/07/2018, 12:05 PM

## 2018-01-07 NOTE — Discharge Summary (Addendum)
Patient ID: SAMINA WEEKES MRN: [id number] DOB/AGE: 01-01-1951 67 y.o.  Admit date: 01/06/2018 Discharge date: 01/07/2018  Admission Diagnoses:  Active Problems:   Closed fracture of right proximal humerus   History of open reduction and internal fixation (ORIF) procedure   Discharge Diagnoses:  Same  Past Medical History:  Diagnosis Date  . Anemia    hx of  . Anginal pain ([place])    "left arm pain, sees Dr. Etter Sjogren, had card cath 2013"  . Arthritis    "all over" (09/14/2012)  . Asthma    takes inhaler 2x day  . Bronchitis    hx of  . Bulging disc    "lower"  . Carpal tunnel syndrome of left wrist   . Depression    denies  . Exertional dyspnea    "sometimes laying down" (09/14/2012)  . Headache    frequent headaches,usually if she does not eat  . Hyperlipidemia   . Hypertension    sees Dr. Debby Freiberg, primary  . Pneumonia 03/2012  . PONV (postoperative nausea and vomiting)   . Thyroid disease 1960's   "don't have it now" (09/14/2012)  . Type II diabetes mellitus ([place])   . Urinary tract infection    hx of  . Vomiting    pt states she vomits every am    Surgeries: Procedure(s): OPEN REDUCTION INTERNAL FIXATION (ORIF) RIGHT PROXIMAL HUMERUS FRACTURE on 01/06/2018   Consultants:   Discharged Condition: Improved  Hospital Course: TAEJAH OHALLORAN is an 67 y.o. female who was admitted 01/06/2018 for operative treatment of<principal problem not specified>. Patient has severe unremitting pain that affects sleep, daily activities, and work/hobbies. After pre-op clearance the patient was taken to the operating room on 01/06/2018 and underwent  Procedure(s): OPEN REDUCTION INTERNAL FIXATION (ORIF) RIGHT PROXIMAL HUMERUS FRACTURE.    Patient was given perioperative antibiotics:  Anti-infectives (From admission, onward)   Start     Dose/Rate Route Frequency Ordered Stop   01/06/18 1800  ceFAZolin (ANCEF) IVPB 2g/100 mL premix     2 g 200 mL/hr over 30 Minutes  Intravenous Every 6 hours 01/06/18 1753 01/07/18 0559   01/06/18 0957  ceFAZolin (ANCEF) 2-4 GM/100ML-% IVPB    Note to Pharmacy:  Debbe Bales, Meredit: cabinet override      01/06/18 0957 01/06/18 1245   01/06/18 0951  ceFAZolin (ANCEF) IVPB 2g/100 mL premix     2 g 200 mL/hr over 30 Minutes Intravenous On call to O.R. 01/06/18 0951 01/06/18 1245   01/06/18 0000  sulfamethoxazole-trimethoprim (BACTRIM DS,SEPTRA DS) 800-160 MG tablet     1 tablet Oral 2 times daily 01/06/18 1412         Patient was given sequential compression devices, early ambulation, and chemoprophylaxis to prevent DVT.  Patient benefited maximally from hospital stay and there were no complications.    Recent vital signs:  Patient Vitals for the past 24 hrs:  BP Temp Temp src Pulse Resp SpO2 Weight  01/07/18 0418 129/75 97.7 F (36.5 C) Oral 86 16 95 % -  01/07/18 0022 122/70 97.7 F (36.5 C) Oral (!) 114 16 95 % -  01/06/18 2022 124/69 98.3 F (36.8 C) Oral (!) 133 16 92 % -  01/06/18 1836 (!) 143/83 98 F (36.7 C) Oral (!) 116 - 90 % -  01/06/18 1820 - - - - - 93 % -  01/06/18 1815 - - - (!) 113 18 (!) 89 % -  01/06/18 1800 - - - - (!) 38 - -  01/06/18 1748 123/85 - - (!) 110 18 90 % -  01/06/18 1745 - - - (!) 112 17 91 % -  01/06/18 1730 - - - (!) 110 20 91 % -  01/06/18 1719 134/84 - - (!) 106 18 92 % -  01/06/18 1653 - 97.8 F (36.6 C) - - - - -  01/06/18 1648 118/70 - - 100 17 91 % -  01/06/18 1645 - - - (!) 109 19 90 % -  01/06/18 1630 - - - 99 15 90 % -  01/06/18 1617 (!) 143/91 - - 96 19 91 % -  01/06/18 1615 - - - 100 17 91 % -  01/06/18 1600 - - - 95 16 91 % -  01/06/18 1548 125/78 - - 92 15 94 % -  01/06/18 1545 - - - 93 15 92 % -  01/06/18 1540 - 97.8 F (36.6 C) - - - - -  01/06/18 1530 - - - 92 16 (!) 89 % -  01/06/18 1523 (!) 148/89 - - 94 20 96 % -  01/06/18 1518 (!) 128/109 - - 91 (!) 21 93 % -  01/06/18 1515 - - - 87 16 96 % -  01/06/18 1503 126/88 - - 88 18 97 % -  01/06/18  1500 - - - 84 15 98 % -  01/06/18 1445 - - - 95 (!) 22 95 % -  01/06/18 1441 129/84 - - - 16 - -  01/06/18 1205 (!) 163/86 - - 93 20 97 % -  01/06/18 1200 140/82 - - 92 18 97 % -  01/06/18 1155 (!) 151/84 - - 89 17 97 % -  01/06/18 1150 (!) 147/86 - - 86 18 98 % -  01/06/18 1145 (!) 164/81 - - 82 15 99 % -  01/06/18 1140 (!) 159/90 - - 80 19 99 % -  01/06/18 1016 (!) 156/89 98.1 F (36.7 C) Oral 84 16 97 % 216 lb (98 kg)     Recent laboratory studies:  Recent Labs    01/06/18 1107 01/07/18 0502  WBC 9.4  --   HGB 12.0  --   HCT 37.9  --   PLT 276  --   NA 139 142  K 3.6 3.9  CL 102 106  CO2 25 26  BUN 15 18  CREATININE 0.63 0.84  GLUCOSE 136* 154*  CALCIUM 9.8 9.4     Discharge Medications:   Allergies as of 01/07/2018      Reactions   Lisinopril Shortness Of Breath   Amlodipine Other (See Comments)   edema   Tape Other (See Comments)   Tore skin--?hypofix   Morphine And Related Nausea And Vomiting   Invokana [canagliflozin] Other (See Comments)   Yeast infections   Percocet [oxycodone-acetaminophen] Nausea And Vomiting      Medication List    STOP taking these medications   HYDROcodone-acetaminophen 5-325 MG tablet Commonly known as:  NORCO/VICODIN Replaced by:  HYDROcodone-acetaminophen 7.5-325 MG tablet     TAKE these medications   albuterol (2.5 MG/3ML) 0.083% nebulizer solution Commonly known as:  PROVENTIL Inhale 2.5 mg into the lungs every 6 (six) hours as needed for wheezing or shortness of breath. Reported on 01/17/2016   ALKA-SELTZER ORIGINAL 325 MG Tbef tablet Generic drug:  aspirin-sod bicarb-citric acid Take 325 mg by mouth every 6 (six) hours as needed (for stomach).   atorvastatin 20 MG tablet Commonly known as:  LIPITOR Take 1  tablet (20 mg total) by mouth daily.   budesonide-formoterol 80-4.5 MCG/ACT inhaler Commonly known as:  SYMBICORT Inhale 2 puffs into the lungs 2 (two) times daily.   calcium-vitamin D 500-200 MG-UNIT  tablet Commonly known as:  OSCAL WITH D Take 1 tablet by mouth 3 (three) times daily.   chlorthalidone 25 MG tablet Commonly known as:  HYGROTON Take 1 tablet (25 mg total) by mouth daily.   HYDROcodone-acetaminophen 7.5-325 MG tablet Commonly known as:  NORCO Take 1-2 tablets by mouth every 6 (six) hours as needed for moderate pain. Replaces:  HYDROcodone-acetaminophen 5-325 MG tablet   Insulin Glargine 300 UNIT/ML Sopn Commonly known as:  TOUJEO SOLOSTAR Inject 50 Units into the skin daily.   Insulin Pen Needle 32G X 6 MM Misc Commonly known as:  NOVOFINE 1 Act by Does not apply route daily.   irbesartan 300 MG tablet Commonly known as:  AVAPRO Take 1 tablet (300 mg total) by mouth daily.   levocetirizine 5 MG tablet Commonly known as:  XYZAL Take 1 tablet (5 mg total) by mouth every evening.   metFORMIN 1000 MG tablet Commonly known as:  GLUCOPHAGE Take 1 tablet (1,000 mg total) by mouth 2 (two) times daily with a meal.   methocarbamol 750 MG tablet Commonly known as:  ROBAXIN Take 1 tablet (750 mg total) by mouth 2 (two) times daily as needed for muscle spasms.   omega-3 acid ethyl esters 1 g capsule Commonly known as:  LOVAZA Take 2 capsules (2 g total) by mouth 2 (two) times daily.   ondansetron 4 MG tablet Commonly known as:  ZOFRAN Take 1-2 tablets (4-8 mg total) by mouth every 8 (eight) hours as needed for nausea or vomiting.   ONETOUCH VERIO IQ SYSTEM w/Device Kit 1 Act by Does not apply route 3 (three) times daily.   ONETOUCH VERIO test strip Generic drug:  glucose blood TEST 3 TIMES A DAY   promethazine 25 MG tablet Commonly known as:  PHENERGAN Take 1 tablet (25 mg total) by mouth every 6 (six) hours as needed for nausea.   Semaglutide 0.25 or 0.5 MG/DOSE Sopn Commonly known as:  OZEMPIC Inject 0.5 mg into the skin once a week. What changed:  when to take this   senna-docusate 8.6-50 MG tablet Commonly known as:  SENOKOT S Take 1 tablet by  mouth at bedtime as needed.   sulfamethoxazole-trimethoprim 800-160 MG tablet Commonly known as:  BACTRIM DS,SEPTRA DS Take 1 tablet by mouth 2 (two) times daily.   zinc sulfate 220 (50 Zn) MG capsule Take 1 capsule (220 mg total) by mouth daily.            Durable Medical Equipment  (From admission, onward)        Start     Ordered   01/06/18 1754  DME Walker rolling  Once    Question:  Patient needs a walker to treat with the following condition  Answer:  History of open reduction and internal fixation (ORIF) procedure   01/06/18 1753   01/06/18 1754  DME 3 n 1  Once     01/06/18 1753   01/06/18 1754  DME Bedside commode  Once    Question:  Patient needs a bedside commode to treat with the following condition  Answer:  History of open reduction and internal fixation (ORIF) procedure   01/06/18 1753      Diagnostic Studies: Dg Shoulder Right  Result Date: 01/06/2018 CLINICAL DATA:  Fracture fixation EXAM:  DG C-ARM 61-120 MIN; RIGHT SHOULDER - 2+ VIEW COMPARISON:  12/27/2017 FINDINGS: Right humeral neck fracture is been fixed with a plate and multiple screws. Satisfactory fracture alignment. Subluxation of the shoulder joint. IMPRESSION: Fixation of right humeral neck fracture. Electronically Signed   By: Franchot Gallo M.D.   On: 01/06/2018 14:04   Dg Shoulder Right  Result Date: 12/27/2017 CLINICAL DATA:  67 y/o F; fall with pain and limited mobility of the right proximal humerus. EXAM: RIGHT HUMERUS - 2+ VIEW; RIGHT SHOULDER - 2+ VIEW COMPARISON:  10/29/2016 right elbow radiographs. FINDINGS: Right humerus: Acute impacted and minimally displaced right humerus surgical neck fracture with fracture extending into greater tuberosity. Inferior subluxation of humeral head. No other fracture identified. Normal elbow joint articulation. Elbow osteoarthrosis with osteophytosis. Radial head prosthesis noted. Right shoulder: Acute impacted and minimally displaced right humerus surgical  neck fracture with fracture extending into greater tuberosity. Inferior subluxation of humeral head. IMPRESSION: 1. Acute impacted and minimally displaced right humerus surgical neck fracture with fracture extending into greater tuberosity. Inferior subluxation of humeral head. 2. No other fracture or dislocation identified. Electronically Signed   By: Kristine Garbe M.D.   On: 12/27/2017 17:45   Ct Shoulder Right Wo Contrast  Result Date: 12/31/2017 CLINICAL DATA:  Proximal right humerus fracture. Nonspecific (abnormal) findings on radiological and other examination of musculoskeletal system. EXAM: CT OF THE UPPER RIGHT EXTREMITY WITHOUT CONTRAST; 3-DIMENSIONAL CT IMAGE RENDERING ON INDEPENDENT WORKSTATION TECHNIQUE: Multidetector CT imaging of the upper right extremity was performed according to the standard protocol. 3-dimensional CT images were rendered by post-processing of the original CT data on an independent workstation. The 3-dimensional CT images were interpreted and findings were reported in the accompanying complete CT report for this study. COMPARISON:  Radiographs dated 12/27/2017 FINDINGS: Bones/Joint/Cartilage There is an impacted transverse fracture of the right humeral neck. There is also a slightly comminuted fracture of the left humeral head. There is slight inferior subluxation of the humeral head with respect to the glenoid due to joint effusion. The scapula is intact.  Right clavicle is intact. No significant abnormality of the adjacent soft tissues. No AC joint arthropathy. IMPRESSION: 1. Impacted fracture of the right humeral neck. 2. Slightly comminuted minimally displaced fracture of the right humeral head. 3. Three-dimensional images were created from the raw data set are and are available for review. These better demonstrate the alignment and position of the multiple fracture fragments. Electronically Signed   By: Lorriane Shire M.D.   On: 12/31/2017 16:20   Ct 3d Recon  At Scanner  Result Date: 12/31/2017 CLINICAL DATA:  Proximal right humerus fracture. Nonspecific (abnormal) findings on radiological and other examination of musculoskeletal system. EXAM: CT OF THE UPPER RIGHT EXTREMITY WITHOUT CONTRAST; 3-DIMENSIONAL CT IMAGE RENDERING ON INDEPENDENT WORKSTATION TECHNIQUE: Multidetector CT imaging of the upper right extremity was performed according to the standard protocol. 3-dimensional CT images were rendered by post-processing of the original CT data on an independent workstation. The 3-dimensional CT images were interpreted and findings were reported in the accompanying complete CT report for this study. COMPARISON:  Radiographs dated 12/27/2017 FINDINGS: Bones/Joint/Cartilage There is an impacted transverse fracture of the right humeral neck. There is also a slightly comminuted fracture of the left humeral head. There is slight inferior subluxation of the humeral head with respect to the glenoid due to joint effusion. The scapula is intact.  Right clavicle is intact. No significant abnormality of the adjacent soft tissues. No AC joint arthropathy. IMPRESSION:  1. Impacted fracture of the right humeral neck. 2. Slightly comminuted minimally displaced fracture of the right humeral head. 3. Three-dimensional images were created from the raw data set are and are available for review. These better demonstrate the alignment and position of the multiple fracture fragments. Electronically Signed   By: Lorriane Shire M.D.   On: 12/31/2017 16:20   Dg Humerus Right  Result Date: 12/27/2017 CLINICAL DATA:  67 y/o F; fall with pain and limited mobility of the right proximal humerus. EXAM: RIGHT HUMERUS - 2+ VIEW; RIGHT SHOULDER - 2+ VIEW COMPARISON:  10/29/2016 right elbow radiographs. FINDINGS: Right humerus: Acute impacted and minimally displaced right humerus surgical neck fracture with fracture extending into greater tuberosity. Inferior subluxation of humeral head. No other  fracture identified. Normal elbow joint articulation. Elbow osteoarthrosis with osteophytosis. Radial head prosthesis noted. Right shoulder: Acute impacted and minimally displaced right humerus surgical neck fracture with fracture extending into greater tuberosity. Inferior subluxation of humeral head. IMPRESSION: 1. Acute impacted and minimally displaced right humerus surgical neck fracture with fracture extending into greater tuberosity. Inferior subluxation of humeral head. 2. No other fracture or dislocation identified. Electronically Signed   By: Kristine Garbe M.D.   On: 12/27/2017 17:45   Dg C-arm 1-60 Min  Result Date: 01/06/2018 CLINICAL DATA:  Fracture fixation EXAM: DG C-ARM 61-120 MIN; RIGHT SHOULDER - 2+ VIEW COMPARISON:  12/27/2017 FINDINGS: Right humeral neck fracture is been fixed with a plate and multiple screws. Satisfactory fracture alignment. Subluxation of the shoulder joint. IMPRESSION: Fixation of right humeral neck fracture. Electronically Signed   By: Franchot Gallo M.D.   On: 01/06/2018 14:04    Disposition: Discharge disposition: 01-Home or Self Care         Follow-up Information    Leandrew Koyanagi, MD In 2 weeks.   Specialty:  Orthopedic Surgery Why:  For suture removal, For wound re-check Contact information: [address] [place] [id number] [phone]            Signed: Aundra Dubin 01/07/2018, 7:45 AM

## 2018-01-08 DIAGNOSIS — S42201A Unspecified fracture of upper end of right humerus, initial encounter for closed fracture: Secondary | ICD-10-CM | POA: Diagnosis not present

## 2018-01-08 LAB — GLUCOSE, CAPILLARY
Glucose-Capillary: 146 mg/dL — ABNORMAL HIGH (ref 65–99)
Glucose-Capillary: 106 mg/dL — ABNORMAL HIGH (ref 65–99)

## 2018-01-08 LAB — COMPREHENSIVE METABOLIC PANEL
ALT: 26 U/L (ref 14–54)
AST: 30 U/L (ref 15–41)
Albumin: 3 g/dL — ABNORMAL LOW (ref 3.5–5.0)
Alkaline Phosphatase: 67 U/L (ref 38–126)
Anion gap: 7 (ref 5–15)
BUN: 20 mg/dL (ref 6–20)
CO2: 26 mmol/L (ref 22–32)
Calcium: 8.6 mg/dL — ABNORMAL LOW (ref 8.9–10.3)
Chloride: 106 mmol/L (ref 101–111)
Creatinine, Ser: 0.75 mg/dL (ref 0.44–1.00)
GFR calc Af Amer: >60 mL/min (ref >60)
GFR calc non Af Amer: >60 mL/min (ref >60)
Glucose, Bld: 136 mg/dL — ABNORMAL HIGH (ref 65–99)
Potassium: 3.7 mmol/L (ref 3.5–5.1)
Sodium: 139 mmol/L (ref 135–145)
Total Bilirubin: 0.9 mg/dL (ref 0.3–1.2)
Total Protein: 5.7 g/dL — ABNORMAL LOW (ref 6.5–8.1)

## 2018-01-08 NOTE — Care Management Note (Signed)
Case Management Note  Patient Details  Name: Abigail Collins MRN: [id number] Date of Birth: March 25, 1951  Subjective/Objective:  67 yr old female admitted with right humerus fracture. Patient s/p ORIF of right Humerus fracture.                   Action/Plan: Case manager spoke with patient concerning discharge plan. Referral for Home Health called to Josie Dixon, [facility]. Patient will have support of husband at discharge.    Expected Discharge Date:  01/08/18               Expected Discharge Plan:  [place]  In-House Referral:  NA  Discharge planning Services  CM Consult  Post Acute Care Choice:  Home Health Choice offered to:  Patient  DME Arranged:  N/A DME Agency:  NA  HH Arranged:  PT [place] Agency:  [facility] (formerly [employer])  Status of Service:  Completed, signed off  If discussed at [employer], dates discussed:    Additional Comments:  Ninfa Meeker, RN 01/08/2018, 12:06 PM

## 2018-01-08 NOTE — Progress Notes (Signed)
Physical Therapy Treatment Patient Details Name: Abigail Collins MRN: [id number] DOB: Feb 13, 1951 Today's Date: 01/08/2018    History of Present Illness Pt is a 67 y/o female s/p ORIF of R proximal humerus fx. PMH includes HTN, COPD, asthma, DM, kyphoplasty, and R TKA.     PT Comments    Patient received sitting at EOB, pleasant and willing to participate in PT today. Her mobility is improving and she demonstrates less balance deficit than was displayed at initial eval, although gait pattern remains antalgic due to ongoing knee pain. Able to ambulate approximately 212f with mild balance deficit, able to use toilet/perform self care with general S. She was left sitting at EOB with all needs met this morning and remains appropriate for HHPT.     Follow Up Recommendations  Home health PT;Supervision for mobility/OOB     Equipment Recommendations  None recommended by PT    Recommendations for Other Services       Precautions / Restrictions Precautions Precautions: Shoulder Precaution Comments: No AROM R shoulder. E/w/hand exercises are ok.. Sling at all times except bathing/dressing/exercises of e/w/h Required Braces or Orthoses: Sling Restrictions Weight Bearing Restrictions: Yes RUE Weight Bearing: Non weight bearing    Mobility  Bed Mobility Overal bed mobility: Needs Assistance Bed Mobility: Sidelying to Sit   Sidelying to sit: Min guard       General bed mobility comments: DNT, sitting at EOB   Transfers Overall transfer level: Needs assistance Equipment used: Straight cane Transfers: Sit to/from Stand Sit to Stand: Min guard Stand pivot transfers: Min guard       General transfer comment: Min guard for safety from elevated surface.   Ambulation/Gait Ambulation/Gait assistance: Supervision Ambulation Distance (Feet): 200 Feet Assistive device: Straight cane Gait Pattern/deviations: Step-through pattern;Decreased stride length;Decreased weight shift to  left;Antalgic Gait velocity: Decreased    General Gait Details: continues to have antalgic gait pattern due to knee pain, ability to use cane in L UE and overall balance improved today    Stairs            Wheelchair Mobility    Modified Rankin (Stroke Patients Only)       Balance Overall balance assessment: Needs assistance Sitting-balance support: No upper extremity supported;Feet supported Sitting balance-Leahy Scale: Good     Standing balance support: Single extremity supported;During functional activity Standing balance-Leahy Scale: Fair Standing balance comment: Reliant on LUE support during ambulation, balance during mobility improved                             Cognition Arousal/Alertness: Awake/alert Behavior During Therapy: WFL for tasks assessed/performed Overall Cognitive Status: Within Functional Limits for tasks assessed                                 General Comments: initially agitated then transitioned into tears. upset about being un comfortable and tired of having been in the sling so long. reports poor sleep past few nights and just "over it"      Exercises      General Comments        Pertinent Vitals/Pain Pain Assessment: Faces Faces Pain Scale: Hurts little more Pain Location: RUE, L knee with mobility Pain Descriptors / Indicators: Aching;Sore Pain Intervention(s): Limited activity within patient's tolerance;Monitored during session    Home Living  Prior Function            PT Goals (current goals can now be found in the care plan section) Acute Rehab PT Goals Patient Stated Goal: regain independence.   PT Goal Formulation: With patient Time For Goal Achievement: 01/21/18 Potential to Achieve Goals: Good Progress towards PT goals: Progressing toward goals    Frequency    Min 5X/week      PT Plan Current plan remains appropriate    Co-evaluation               AM-PAC PT "6 Clicks" Daily Activity  Outcome Measure  Difficulty turning over in bed (including adjusting bedclothes, sheets and blankets)?: Unable Difficulty moving from lying on back to sitting on the side of the bed? : Unable Difficulty sitting down on and standing up from a chair with arms (e.g., wheelchair, bedside commode, etc,.)?: Unable Help needed moving to and from a bed to chair (including a wheelchair)?: A Little Help needed walking in hospital room?: A Little Help needed climbing 3-5 steps with a railing? : A Little 6 Click Score: 12    End of Session Equipment Utilized During Treatment: Other (comment)(sling R UE ) Activity Tolerance: Patient tolerated treatment well Patient left: in bed;with call bell/phone within reach   PT Visit Diagnosis: Unsteadiness on feet (R26.81);Other abnormalities of gait and mobility (R26.89);Pain Pain - Right/Left: Right Pain - part of body: Shoulder     Time: 1150-1209 PT Time Calculation (min) (ACUTE ONLY): 19 min  Charges:  $Gait Training: 8-22 mins                    G Codes:       Deniece Ree PT, DPT, CBIS  Supplemental Physical Therapist [place]   Pager [phone]

## 2018-01-08 NOTE — Progress Notes (Addendum)
Subjective: 2 Days Post-Op Procedure(s) (LRB): OPEN REDUCTION INTERNAL FIXATION (ORIF) RIGHT PROXIMAL HUMERUS FRACTURE (Right) Patient reports pain as mild.  No nausea vomiting, lightheadedness/dizziness.    Objective: Vital signs in last 24 hours: Temp:  [97.8 F (36.6 C)-98.2 F (36.8 C)] 98 F (36.7 C) (03/29 0422) Pulse Rate:  [83-92] 83 (03/29 0422) Resp:  [17-18] 17 (03/29 0422) BP: (119-140)/(54-70) 140/70 (03/29 0422) SpO2:  [94 %-99 %] 96 % (03/29 0422)  Intake/Output from previous day: 03/28 0701 - 03/29 0700 In: 120 [P.O.:120] Out: -  Intake/Output this shift: No intake/output data recorded.  Recent Labs    01/06/18 1107  HGB 12.0   Recent Labs    01/06/18 1107  WBC 9.4  RBC 4.00  HCT 37.9  PLT 276   Recent Labs    01/07/18 0502 01/08/18 0538  NA 142 139  K 3.9 3.7  CL 106 106  CO2 26 26  BUN 18 20  CREATININE 0.84 0.75  GLUCOSE 154* 136*  CALCIUM 9.4 8.6*   No results for input(s): LABPT, INR in the last 72 hours.  Neurologically intact Neurovascular intact Sensation intact distally Intact pulses distally Dorsiflexion/Plantar flexion intact Incision: dressing C/D/I No cellulitis present Compartment soft  Assessment/Plan: 2 Days Post-Op Procedure(s) (LRB): OPEN REDUCTION INTERNAL FIXATION (ORIF) RIGHT PROXIMAL HUMERUS FRACTURE (Right) Advance diet Up with therapy D/C IV fluids  Sling at all times RUE unless working on elbow/wrist ROM in seated position.   NWB RUE D/C home today AFTER 2pm ABLA-mild, stable and asymptomatic     Aundra Dubin 01/08/2018, 7:49 AM

## 2018-01-08 NOTE — Plan of Care (Signed)
  Problem: Education: Goal: Knowledge of General Education information will improve Outcome: Progressing   Problem: Health Behavior/Discharge Planning: Goal: Ability to manage health-related needs will improve Outcome: Progressing   Problem: Clinical Measurements: Goal: Ability to maintain clinical measurements within normal limits will improve Outcome: Progressing Goal: Will remain free from infection Outcome: Progressing Goal: Diagnostic test results will improve Outcome: Progressing Goal: Respiratory complications will improve Outcome: Progressing Goal: Cardiovascular complication will be avoided Outcome: Progressing   Problem: Activity: Goal: Risk for activity intolerance will decrease Outcome: Progressing   Problem: Nutrition: Goal: Adequate nutrition will be maintained Outcome: Progressing   Problem: Coping: Goal: Level of anxiety will decrease Outcome: Progressing   Problem: Elimination: Goal: Will not experience complications related to bowel motility Outcome: Progressing Goal: Will not experience complications related to urinary retention Outcome: Progressing   Problem: Pain Managment: Goal: General experience of comfort will improve Outcome: Progressing   Problem: Safety: Goal: Ability to remain free from injury will improve Outcome: Progressing   Problem: Skin Integrity: Goal: Risk for impaired skin integrity will decrease Outcome: Progressing   Problem: Spiritual Needs Goal: Ability to function at adequate level Outcome: Progressing   Problem: Education: Goal: Knowledge of the prescribed therapeutic regimen will improve Outcome: Progressing Goal: Understanding of activity limitations/precautions following surgery will improve Outcome: Progressing   Problem: Activity: Goal: Ability to tolerate increased activity will improve Outcome: Progressing   Problem: Pain Management: Goal: Pain level will decrease with appropriate  interventions Outcome: Progressing

## 2018-01-08 NOTE — Progress Notes (Signed)
Occupational Therapy Treatment Patient Details Name: Abigail Collins MRN: [id number] DOB: 1951/08/13 Today's Date: 01/08/2018    History of present illness Pt is a 67 y/o female s/p ORIF of R proximal humerus fx. PMH includes HTN, COPD, asthma, DM, kyphoplasty, and R TKA.    OT comments  Pt. Seen initially for ROM of R digits, wrist, and elbow.  Also performed bed mobility and amb. To/from b.room for toileting tasks.  Education on sling positioning.    Follow Up Recommendations  Follow surgeon's recommendation for DC plan and follow-up therapies    Equipment Recommendations  None recommended by OT    Recommendations for Other Services      Precautions / Restrictions Precautions Precautions: Shoulder Precaution Comments: No AROM R shoulder. E/w/hand exercises are ok.. Sling at all times except bathing/dressing/exercises of e/w/h Required Braces or Orthoses: Sling Restrictions Weight Bearing Restrictions: Yes RUE Weight Bearing: Non weight bearing       Mobility Bed Mobility Overal bed mobility: Needs Assistance Bed Mobility: Sidelying to Sit   Sidelying to sit: Min guard       General bed mobility comments: multiple attempts for repositioning. pt. with multiple pillows and requests for moving pillows for comfort.  unable to achieve desired pillow placement which prompted pt. to request oob.   Transfers Overall transfer level: Needs assistance Equipment used: Straight cane Transfers: Sit to/from [facility] Sit to Stand: Min guard Stand pivot transfers: Min guard            Balance                                           ADL either performed or assessed with clinical judgement   ADL Overall ADL's : Needs assistance/impaired                 Upper Body Dressing : Moderate assistance;Sitting;Cueing for compensatory techniques Upper Body Dressing Details (indicate cue type and reason): great difficulty with sling  management and comfort. pt. unable to tolerate proper positioning due to descriptors of claustrophobia and can not tolerate velcro touching her neck     Toilet Transfer: Min guard;Ambulation Toilet Transfer Details (indicate cue type and reason): amb. with cane  Toileting- Clothing Manipulation and Hygiene: Supervision/safety;Sit to/from stand               Vision       Perception     Praxis      Cognition Arousal/Alertness: Awake/alert Behavior During Therapy: Agitated Overall Cognitive Status: Within Functional Limits for tasks assessed                                 General Comments: initially agitated then transitioned into tears. upset about being un comfortable and tired of having been in the sling so long. reports poor sleep past few nights and just "over it"        Exercises     Shoulder Instructions       General Comments      Pertinent Vitals/ Pain       Pain Assessment: No/denies pain  Home Living  Prior Functioning/Environment              Frequency  Min 3X/week        Progress Toward Goals  OT Goals(current goals can now be found in the care plan section)  Progress towards OT goals: Progressing toward goals     Plan Discharge plan remains appropriate    Co-evaluation                 AM-PAC PT "6 Clicks" Daily Activity     Outcome Measure   Help from another person eating meals?: None Help from another person taking care of personal grooming?: A Lot Help from another person toileting, which includes using toliet, bedpan, or urinal?: A Little Help from another person bathing (including washing, rinsing, drying)?: A Lot Help from another person to put on and taking off regular upper body clothing?: A Lot Help from another person to put on and taking off regular lower body clothing?: A Little 6 Click Score: 16    End of Session Equipment Utilized  During Treatment: Other (comment)(sling)  OT Visit Diagnosis: Unsteadiness on feet (R26.81);Pain;Other abnormalities of gait and mobility (R26.89)   Activity Tolerance Treatment limited secondary to agitation   Patient Left Other (comment)(seated eob with tray table in front of her)   Nurse Communication          Time: 7829-5621 OT Time Calculation (min): 30 min  Charges: OT General Charges $OT Visit: 1 Visit OT Treatments $Self Care/Home Management : 23-37 mins  Janice Coffin, COTA/L 01/08/2018, 11:39 AM

## 2018-01-09 DIAGNOSIS — J449 Chronic obstructive pulmonary disease, unspecified: Secondary | ICD-10-CM | POA: Diagnosis not present

## 2018-01-09 DIAGNOSIS — S42251D Displaced fracture of greater tuberosity of right humerus, subsequent encounter for fracture with routine healing: Secondary | ICD-10-CM | POA: Diagnosis not present

## 2018-01-09 DIAGNOSIS — M5440 Lumbago with sciatica, unspecified side: Secondary | ICD-10-CM | POA: Diagnosis not present

## 2018-01-09 DIAGNOSIS — M5412 Radiculopathy, cervical region: Secondary | ICD-10-CM | POA: Diagnosis not present

## 2018-01-09 DIAGNOSIS — D649 Anemia, unspecified: Secondary | ICD-10-CM | POA: Diagnosis not present

## 2018-01-09 DIAGNOSIS — M170 Bilateral primary osteoarthritis of knee: Secondary | ICD-10-CM | POA: Diagnosis not present

## 2018-01-09 DIAGNOSIS — M1712 Unilateral primary osteoarthritis, left knee: Secondary | ICD-10-CM | POA: Diagnosis not present

## 2018-01-09 DIAGNOSIS — K3184 Gastroparesis: Secondary | ICD-10-CM | POA: Diagnosis not present

## 2018-01-09 DIAGNOSIS — E1143 Type 2 diabetes mellitus with diabetic autonomic (poly)neuropathy: Secondary | ICD-10-CM | POA: Diagnosis not present

## 2018-01-09 DIAGNOSIS — I10 Essential (primary) hypertension: Secondary | ICD-10-CM | POA: Diagnosis not present

## 2018-01-09 DIAGNOSIS — Z6837 Body mass index (BMI) 37.0-37.9, adult: Secondary | ICD-10-CM | POA: Diagnosis not present

## 2018-01-09 DIAGNOSIS — F329 Major depressive disorder, single episode, unspecified: Secondary | ICD-10-CM | POA: Diagnosis not present

## 2018-01-11 ENCOUNTER — Telehealth (INDEPENDENT_AMBULATORY_CARE_PROVIDER_SITE_OTHER): Payer: Self-pay | Admitting: Orthopaedic Surgery

## 2018-01-11 NOTE — Telephone Encounter (Signed)
Gwinda Passe gave me her number - [phone] Called Abigail Collins and left her Voicemail.

## 2018-01-11 NOTE — Telephone Encounter (Signed)
yes

## 2018-01-11 NOTE — Telephone Encounter (Signed)
Per notes xu saying yes to OT and order for weeks. Tiffany Kocher per conversation notes Erlinda Hong said yes.  Call Verdis Frederickson if this is incorrect. [phone]

## 2018-01-11 NOTE — Telephone Encounter (Signed)
Abigail Collins from [place] at [facility] asking for verbal orders on home health PT 1 week 1 and 2 week 2. Also requesting order for occupational therapy evaluation. CB # [phone]

## 2018-01-11 NOTE — Telephone Encounter (Signed)
Called number. Number was incorrect. Called Sonia Side to see if he can help with this, and get the correct number.

## 2018-01-11 NOTE — Telephone Encounter (Signed)
Okay to approve orders?

## 2018-01-11 NOTE — Telephone Encounter (Signed)
Abigail Collins no answer LMOM to return my call.

## 2018-01-12 ENCOUNTER — Ambulatory Visit: Payer: PPO | Admitting: Internal Medicine

## 2018-01-13 ENCOUNTER — Telehealth (INDEPENDENT_AMBULATORY_CARE_PROVIDER_SITE_OTHER): Payer: Self-pay | Admitting: Orthopaedic Surgery

## 2018-01-13 NOTE — Telephone Encounter (Signed)
Abigail Collins came by during lunch coverage.  Printed out Op note and was given to him.

## 2018-01-13 NOTE — Telephone Encounter (Signed)
Maria from [place] at [facility] called asking for an exact diagnosis in regards to her antibiotics that was prescribed to her. CB # [phone]

## 2018-01-15 ENCOUNTER — Telehealth (INDEPENDENT_AMBULATORY_CARE_PROVIDER_SITE_OTHER): Payer: Self-pay | Admitting: Orthopaedic Surgery

## 2018-01-15 NOTE — Telephone Encounter (Signed)
Ok to rf? 

## 2018-01-15 NOTE — Telephone Encounter (Signed)
Med refill  Post op period for pt   Hydrocodone

## 2018-01-15 NOTE — Telephone Encounter (Signed)
Yes #30

## 2018-01-16 DIAGNOSIS — F329 Major depressive disorder, single episode, unspecified: Secondary | ICD-10-CM | POA: Diagnosis not present

## 2018-01-16 DIAGNOSIS — D649 Anemia, unspecified: Secondary | ICD-10-CM | POA: Diagnosis not present

## 2018-01-16 DIAGNOSIS — M170 Bilateral primary osteoarthritis of knee: Secondary | ICD-10-CM | POA: Diagnosis not present

## 2018-01-16 DIAGNOSIS — E1143 Type 2 diabetes mellitus with diabetic autonomic (poly)neuropathy: Secondary | ICD-10-CM | POA: Diagnosis not present

## 2018-01-16 DIAGNOSIS — M1712 Unilateral primary osteoarthritis, left knee: Secondary | ICD-10-CM | POA: Diagnosis not present

## 2018-01-16 DIAGNOSIS — K3184 Gastroparesis: Secondary | ICD-10-CM | POA: Diagnosis not present

## 2018-01-16 DIAGNOSIS — M5440 Lumbago with sciatica, unspecified side: Secondary | ICD-10-CM | POA: Diagnosis not present

## 2018-01-16 DIAGNOSIS — S42251D Displaced fracture of greater tuberosity of right humerus, subsequent encounter for fracture with routine healing: Secondary | ICD-10-CM | POA: Diagnosis not present

## 2018-01-16 DIAGNOSIS — M5412 Radiculopathy, cervical region: Secondary | ICD-10-CM | POA: Diagnosis not present

## 2018-01-16 DIAGNOSIS — J449 Chronic obstructive pulmonary disease, unspecified: Secondary | ICD-10-CM | POA: Diagnosis not present

## 2018-01-16 DIAGNOSIS — Z6837 Body mass index (BMI) 37.0-37.9, adult: Secondary | ICD-10-CM | POA: Diagnosis not present

## 2018-01-16 DIAGNOSIS — I10 Essential (primary) hypertension: Secondary | ICD-10-CM | POA: Diagnosis not present

## 2018-01-18 MED ORDER — HYDROCODONE-ACETAMINOPHEN 5-325 MG PO TABS: 1.0000 | ORAL_TABLET | Freq: Three times a day (TID) | ORAL | 0 refills | Status: DC | PRN

## 2018-01-18 NOTE — Telephone Encounter (Signed)
IC advised could pick up at front desk.  

## 2018-01-19 ENCOUNTER — Ambulatory Visit (INDEPENDENT_AMBULATORY_CARE_PROVIDER_SITE_OTHER): Payer: PPO

## 2018-01-19 ENCOUNTER — Ambulatory Visit (INDEPENDENT_AMBULATORY_CARE_PROVIDER_SITE_OTHER): Payer: PPO | Admitting: Orthopaedic Surgery

## 2018-01-19 ENCOUNTER — Encounter (INDEPENDENT_AMBULATORY_CARE_PROVIDER_SITE_OTHER): Payer: Self-pay | Admitting: Orthopaedic Surgery

## 2018-01-19 DIAGNOSIS — S42291A Other displaced fracture of upper end of right humerus, initial encounter for closed fracture: Secondary | ICD-10-CM

## 2018-01-19 NOTE — Progress Notes (Signed)
Post-Op Visit Note   Patient: Abigail Collins           Date of Birth: 1951/07/17           MRN: [id number] Visit Date: 01/19/2018 PCP: Janith Lima, MD   Assessment & Plan:  Chief Complaint:  Chief Complaint  Patient presents with  . Right Shoulder - Follow-up, Routine Post Op   Visit Diagnoses:  1. Other closed displaced fracture of proximal end of right humerus, initial encounter     Plan: Patient comes in for follow-up.  13 days status post ORIF right proximal humerus fracture, date of surgery 01/06/2018.  She has been doing well since surgery.  No fevers or chills.  She does note moderate pain however this is relieved with Norco 1-2 every 6 hours.  Examination of her right upper extremity reveals a well-healing surgical incision with nylon sutures in tact.  No signs of infection or cellulitis.  Moderate swelling.  She does have some ecchymosis.  She is neurovascularly intact distally.  At this point, we will remove the sutures and apply Steri-Strips and we will transition the patient to outpatient physical therapy to work on pendulum swings, passive range of motion and active assisted range of motion.  She will follow-up with [place] in 4 weeks time for repeat evaluation and x-ray.  Follow-Up Instructions: Return in about 1 month (around 02/16/2018).   Orders:  Orders Placed This Encounter  Procedures  . XR Humerus Right   No orders of the defined types were placed in this encounter.   Imaging: No results found.  PMFS History: Patient Active Problem List   Diagnosis Date Noted  . Closed fracture of right proximal humerus 01/06/2018  . History of open reduction and internal fixation (ORIF) procedure 01/06/2018  . Upper respiratory tract infection 11/26/2017  . BV (bacterial vaginosis) 10/01/2017  . Yeast vaginitis 07/01/2017  . Excessive daytime sleepiness 05/27/2016  . COPD mixed type ([place]) 01/09/2016  . Visit for screening mammogram 03/28/2015  . Colon cancer  screening 03/28/2015  . Depression with somatization 03/28/2015  . Insomnia 03/28/2015  . Primary osteoarthritis of both knees 03/28/2015  . Allergic rhinitis 03/15/2014  . Hyperlipidemia with target LDL less than 100 12/22/2013  . Routine general medical examination at a health care facility 08/03/2013  . Lumbar compression fracture ([place]) 11/01/2012  . Pure hyperglyceridemia 08/05/2012  . Gastroparesis due to DM ([place]) 08/05/2012  . Other screening mammogram 08/05/2012  . Diabetes mellitus with neurological manifestations, uncontrolled ([place]) 04/05/2012  . HTN (hypertension), benign 04/05/2012  . GERD (gastroesophageal reflux disease) 04/05/2012  . Morbid obesity ([place]) 04/05/2012   Past Medical History:  Diagnosis Date  . Anemia    hx of  . Anginal pain ([place])    "left arm pain, sees Dr. Etter Sjogren, had card cath 2013"  . Arthritis    "all over" (09/14/2012)  . Asthma    takes inhaler 2x day  . Bronchitis    hx of  . Bulging disc    "lower"  . Carpal tunnel syndrome of left wrist   . Depression    denies  . Exertional dyspnea    "sometimes laying down" (09/14/2012)  . Headache    frequent headaches,usually if she does not eat  . Hyperlipidemia   . Hypertension    sees Dr. Debby Freiberg, primary  . Pneumonia 03/2012  . PONV (postoperative nausea and vomiting)   . Thyroid disease 1960's   "don't have it now" (09/14/2012)  .  Type II diabetes mellitus ([place])   . Urinary tract infection    hx of  . Vomiting    pt states she vomits every am    Family History  Problem Relation Age of Onset  . Arthritis Mother   . Arthritis Father   . Hypertension Father   . Diabetes Father   . Colon cancer Neg Hx     Past Surgical History:  Procedure Laterality Date  . CARDIAC CATHETERIZATION  05/13/2012   mod luminal irregularity of pLAD, no sign CAD, EF 65%.  . CHOLECYSTECTOMY  1970's  . KYPHOPLASTY N/A 02/10/2013   Procedure:  LUMBAR TWO KYPHOPLASTY;  Surgeon: Ophelia Charter, MD;   Location: [facility];  Service: Neurosurgery;  Laterality: N/A;  L2 Kyphoplasty; Will use Stern's Carm.  . ORIF HUMERUS FRACTURE Right 01/06/2018   Procedure: OPEN REDUCTION INTERNAL FIXATION (ORIF) RIGHT PROXIMAL HUMERUS FRACTURE;  Surgeon: Leandrew Koyanagi, MD;  Location: [facility];  Service: Orthopedics;  Laterality: Right;  . TOTAL ELBOW REPLACEMENT  ~ 2005   "right" (09/14/2012)  . TOTAL KNEE ARTHROPLASTY  09/13/2012   Procedure: TOTAL KNEE ARTHROPLASTY;  Surgeon: Vickey Huger, MD;  Location: [facility];  Service: Orthopedics;  Laterality: Right;  . TUBAL LIGATION  1970's  . VAGINAL HYSTERECTOMY  1970's   Social History   Occupational History  . Not on file  Tobacco Use  . Smoking status: Former Smoker    Packs/day: 0.25    Years: 20.00    Pack years: 5.00    Types: Cigarettes    Last attempt to quit: 10/13/2009    Years since quitting: 8.2  . Smokeless tobacco: Never Used  Substance and Sexual Activity  . Alcohol use: No    Comment: 09/14/2012 "did drink a little in my younger days"  . Drug use: No  . Sexual activity: Not Currently

## 2018-01-25 ENCOUNTER — Telehealth (INDEPENDENT_AMBULATORY_CARE_PROVIDER_SITE_OTHER): Payer: Self-pay | Admitting: Orthopaedic Surgery

## 2018-01-25 NOTE — Telephone Encounter (Signed)
FAXED ORDER TO [facility] FAX NUMBER: [phone]

## 2018-01-25 NOTE — Telephone Encounter (Signed)
Patient called stating that she called the PT facility to set an appointment, but was told that she need a referral.  She would like to go to the one on [address]  [facility]).  [phone].  Thank you.

## 2018-01-27 ENCOUNTER — Ambulatory Visit: Payer: PPO | Admitting: Internal Medicine

## 2018-02-01 ENCOUNTER — Encounter: Payer: Self-pay | Admitting: Internal Medicine

## 2018-02-01 ENCOUNTER — Ambulatory Visit (INDEPENDENT_AMBULATORY_CARE_PROVIDER_SITE_OTHER): Payer: PPO | Admitting: Internal Medicine

## 2018-02-01 VITALS — BP 140/80 | HR 98 | Temp 98.2°F | Ht 64.0 in | Wt 211.0 lb

## 2018-02-01 DIAGNOSIS — IMO0002 Reserved for concepts with insufficient information to code with codable children: Secondary | ICD-10-CM

## 2018-02-01 DIAGNOSIS — E1149 Type 2 diabetes mellitus with other diabetic neurological complication: Secondary | ICD-10-CM

## 2018-02-01 DIAGNOSIS — M8000XA Age-related osteoporosis with current pathological fracture, unspecified site, initial encounter for fracture: Secondary | ICD-10-CM | POA: Diagnosis not present

## 2018-02-01 DIAGNOSIS — I10 Essential (primary) hypertension: Secondary | ICD-10-CM | POA: Diagnosis not present

## 2018-02-01 DIAGNOSIS — E1165 Type 2 diabetes mellitus with hyperglycemia: Secondary | ICD-10-CM | POA: Diagnosis not present

## 2018-02-01 NOTE — Progress Notes (Signed)
Subjective:  Patient ID: Abigail Collins, female    DOB: 08-Apr-1951  Age: 67 y.o. MRN: [id number]  CC: Hypertension and Diabetes   HPI Abigail Collins presents for f/up - She recently had a ground-level fall and fractured her right upper humerus.  It required ORIF.  She has a history of moderately severe osteoporosis but has not been treating it.  She is agreeable to treatment options at this time.  She also tells me her blood sugars have been very well controlled.  She recently added a GLP-1 agonist and says it has helped control her blood sugars and help her lose weight.  Outpatient Medications Prior to Visit  Medication Sig Dispense Refill  . albuterol (PROVENTIL) (2.5 MG/3ML) 0.083% nebulizer solution Inhale 2.5 mg into the lungs every 6 (six) hours as needed for wheezing or shortness of breath. Reported on 01/17/2016    . atorvastatin (LIPITOR) 20 MG tablet Take 1 tablet (20 mg total) by mouth daily. 90 tablet 1  . Blood Glucose Monitoring Suppl (ONETOUCH VERIO IQ SYSTEM) W/DEVICE KIT 1 Act by Does not apply route 3 (three) times daily. 2 kit 0  . budesonide-formoterol (SYMBICORT) 80-4.5 MCG/ACT inhaler Inhale 2 puffs into the lungs 2 (two) times daily. 1 Inhaler 11  . calcium-vitamin D (OSCAL WITH D) 500-200 MG-UNIT tablet Take 1 tablet by mouth 3 (three) times daily. 90 tablet 12  . chlorthalidone (HYGROTON) 25 MG tablet Take 1 tablet (25 mg total) by mouth daily. 90 tablet 1  . Insulin Pen Needle (NOVOFINE) 32G X 6 MM MISC 1 Act by Does not apply route daily. 100 each 3  . irbesartan (AVAPRO) 300 MG tablet Take 1 tablet (300 mg total) by mouth daily. 90 tablet 1  . levocetirizine (XYZAL) 5 MG tablet Take 1 tablet (5 mg total) by mouth every evening. 90 tablet 3  . metFORMIN (GLUCOPHAGE) 1000 MG tablet Take 1 tablet (1,000 mg total) by mouth 2 (two) times daily with a meal. 180 tablet 1  . omega-3 acid ethyl esters (LOVAZA) 1 g capsule Take 2 capsules (2 g total) by mouth 2 (two)  times daily. 360 capsule 1  . zinc sulfate 220 (50 Zn) MG capsule Take 1 capsule (220 mg total) by mouth daily. 42 capsule 0  . aspirin-sod bicarb-citric acid (ALKA-SELTZER ORIGINAL) 325 MG TBEF tablet Take 325 mg by mouth every 6 (six) hours as needed (for stomach).    Marland Kitchen HYDROcodone-acetaminophen (NORCO/VICODIN) 5-325 MG tablet Take 1 tablet by mouth every 8 (eight) hours as needed for moderate pain. 30 tablet 0  . Insulin Glargine (TOUJEO SOLOSTAR) 300 UNIT/ML SOPN Inject 50 Units into the skin daily. 4.5 mL 5  . Semaglutide (OZEMPIC) 0.25 or 0.5 MG/DOSE SOPN Inject 0.5 mg into the skin once a week. (Patient taking differently: Inject 0.5 mg into the skin every Wednesday. ) 1.5 mL 5  . ONETOUCH VERIO test strip TEST 3 TIMES A DAY 200 each 11  . methocarbamol (ROBAXIN) 750 MG tablet Take 1 tablet (750 mg total) by mouth 2 (two) times daily as needed for muscle spasms. 60 tablet 0  . ondansetron (ZOFRAN) 4 MG tablet Take 1-2 tablets (4-8 mg total) by mouth every 8 (eight) hours as needed for nausea or vomiting. 40 tablet 0  . promethazine (PHENERGAN) 25 MG tablet Take 1 tablet (25 mg total) by mouth every 6 (six) hours as needed for nausea. 30 tablet 1  . senna-docusate (SENOKOT S) 8.6-50 MG tablet Take 1 tablet  by mouth at bedtime as needed. 30 tablet 1  . sulfamethoxazole-trimethoprim (BACTRIM DS,SEPTRA DS) 800-160 MG tablet Take 1 tablet by mouth 2 (two) times daily. 14 tablet 0   No facility-administered medications prior to visit.     ROS Review of Systems  Constitutional: Negative.  Negative for appetite change, diaphoresis, fatigue and unexpected weight change.  HENT: Negative.   Eyes: Negative for visual disturbance.  Respiratory: Negative for cough, chest tightness and wheezing.   Cardiovascular: Negative for chest pain, palpitations and leg swelling.  Gastrointestinal: Negative for abdominal pain, diarrhea, nausea and vomiting.  Endocrine: Negative.   Genitourinary: Negative.     Musculoskeletal: Positive for arthralgias. Negative for myalgias and neck pain.  Skin: Negative.  Negative for color change and rash.  Allergic/Immunologic: Negative.   Neurological: Negative.  Negative for dizziness, weakness and light-headedness.  Hematological: Negative for adenopathy. Does not bruise/bleed easily.  Psychiatric/Behavioral: Negative.     Objective:  BP 140/80 (BP Location: Left Arm, Patient Position: Sitting, Cuff Size: Large)   Pulse 98   Temp 98.2 F (36.8 C) (Oral)   Ht 5' 4"  (1.626 m)   Wt 211 lb (95.7 kg)   SpO2 96%   BMI 36.22 kg/m   BP Readings from Last 3 Encounters:  02/01/18 140/80  01/08/18 140/70  12/27/17 (!) 162/105    Wt Readings from Last 3 Encounters:  02/01/18 211 lb (95.7 kg)  01/06/18 216 lb (98 kg)  12/27/17 216 lb (98 kg)    Physical Exam  Constitutional: She is oriented to person, place, and time. No distress.  HENT:  Mouth/Throat: Oropharynx is clear and moist. No oropharyngeal exudate.  Eyes: Conjunctivae are normal. No scleral icterus.  Neck: Normal range of motion. Neck supple. No JVD present. No thyromegaly present.  Cardiovascular: Normal rate, regular rhythm and normal heart sounds. Exam reveals no gallop and no friction rub.  No murmur heard. Pulmonary/Chest: Effort normal and breath sounds normal. No stridor. No respiratory distress. She has no wheezes. She has no rales.  Abdominal: Soft. Bowel sounds are normal. She exhibits no distension and no mass. There is no tenderness.  Musculoskeletal: Normal range of motion. She exhibits no edema, tenderness or deformity.  Neurological: She is alert and oriented to person, place, and time.  Skin: Skin is warm and dry. She is not diaphoretic. No pallor.  Vitals reviewed.   Lab Results  Component Value Date   WBC 9.4 01/06/2018   HGB 12.0 01/06/2018   HCT 37.9 01/06/2018   PLT 276 01/06/2018   GLUCOSE 136 (H) 01/08/2018   CHOL 174 05/26/2017   TRIG 339.0 (H) 05/26/2017    HDL 51.20 05/26/2017   LDLDIRECT 77.0 05/26/2017   LDLCALC UNABLE TO CALCULATE IF TRIGLYCERIDE OVER 400 mg/dL 04/06/2012   ALT 26 01/08/2018   AST 30 01/08/2018   NA 139 01/08/2018   K 3.7 01/08/2018   CL 106 01/08/2018   CREATININE 0.75 01/08/2018   BUN 20 01/08/2018   CO2 26 01/08/2018   TSH 2.38 05/26/2017   INR 0.88 09/03/2012   HGBA1C 6.2 (H) 01/06/2018   MICROALBUR 5.3 (H) 05/26/2017    Dg Shoulder Right  Result Date: 01/06/2018 CLINICAL DATA:  Fracture fixation EXAM: DG C-ARM 61-120 MIN; RIGHT SHOULDER - 2+ VIEW COMPARISON:  12/27/2017 FINDINGS: Right humeral neck fracture is been fixed with a plate and multiple screws. Satisfactory fracture alignment. Subluxation of the shoulder joint. IMPRESSION: Fixation of right humeral neck fracture. Electronically Signed   By: Juanda Crumble  Carlis Abbott M.D.   On: 01/06/2018 14:04   Dg C-arm 1-60 Min  Result Date: 01/06/2018 CLINICAL DATA:  Fracture fixation EXAM: DG C-ARM 61-120 MIN; RIGHT SHOULDER - 2+ VIEW COMPARISON:  12/27/2017 FINDINGS: Right humeral neck fracture is been fixed with a plate and multiple screws. Satisfactory fracture alignment. Subluxation of the shoulder joint. IMPRESSION: Fixation of right humeral neck fracture. Electronically Signed   By: Franchot Gallo M.D.   On: 01/06/2018 14:04    Assessment & Plan:   Abigail Collins was seen today for hypertension and diabetes.  Diagnoses and all orders for this visit:  Diabetes mellitus with neurological manifestations, uncontrolled ([place])- Her recent A1c was down to 6.2%.  She has had a great response to the GLP-1 agonist.  We will advance the dose to 1.0 mg a day.  Since her A1c is down to 6.2% I have asked her to stop using the basal insulin.  Will continue metformin at the current dose. -     Ambulatory referral to Ophthalmology -     Semaglutide (OZEMPIC) 0.25 or 0.5 MG/DOSE SOPN; Inject 1 mg into the skin once a week.  Age-related osteoporosis with current pathological fracture,  initial encounter- I think she would benefit from using the new drug evenity to help her rapidly increase her bone formation and decrease her risk of another fracture.  We do not yet have this in stock but will soon.  When it arrives we will notify her and start her monthly injections. -     DG Bone Density; Future  HTN (hypertension), benign- Her blood pressure is adequately well controlled.   I have discontinued Abigail Collins. Ridge's Insulin Glargine, aspirin-sod bicarb-citric acid, senna-docusate, promethazine, ondansetron, methocarbamol, sulfamethoxazole-trimethoprim, and HYDROcodone-acetaminophen. I have also changed her Semaglutide. Additionally, I am having her maintain her ONETOUCH VERIO IQ SYSTEM, albuterol, budesonide-formoterol, Insulin Pen Needle, ONETOUCH VERIO, chlorthalidone, omega-3 acid ethyl esters, levocetirizine, atorvastatin, irbesartan, metFORMIN, calcium-vitamin D, and zinc sulfate.  Meds ordered this encounter  Medications  . Semaglutide (OZEMPIC) 0.25 or 0.5 MG/DOSE SOPN    Sig: Inject 1 mg into the skin once a week.    Dispense:  1.5 mL    Refill:  5     Follow-up: Return in about 6 months (around 08/03/2018).  Scarlette Calico, MD

## 2018-02-01 NOTE — Patient Instructions (Signed)

## 2018-02-02 DIAGNOSIS — M8000XA Age-related osteoporosis with current pathological fracture, unspecified site, initial encounter for fracture: Secondary | ICD-10-CM | POA: Insufficient documentation

## 2018-02-02 MED ORDER — SEMAGLUTIDE(0.25 OR 0.5MG/DOS) 2 MG/1.5ML ~~LOC~~ SOPN: 1.0000 mg | PEN_INJECTOR | SUBCUTANEOUS | 5 refills | Status: DC

## 2018-02-04 DIAGNOSIS — M25511 Pain in right shoulder: Secondary | ICD-10-CM | POA: Diagnosis not present

## 2018-02-04 DIAGNOSIS — S42201A Unspecified fracture of upper end of right humerus, initial encounter for closed fracture: Secondary | ICD-10-CM | POA: Diagnosis not present

## 2018-02-04 DIAGNOSIS — M6281 Muscle weakness (generalized): Secondary | ICD-10-CM | POA: Diagnosis not present

## 2018-02-04 DIAGNOSIS — M25611 Stiffness of right shoulder, not elsewhere classified: Secondary | ICD-10-CM | POA: Diagnosis not present

## 2018-02-08 DIAGNOSIS — M6281 Muscle weakness (generalized): Secondary | ICD-10-CM | POA: Diagnosis not present

## 2018-02-08 DIAGNOSIS — M25511 Pain in right shoulder: Secondary | ICD-10-CM | POA: Diagnosis not present

## 2018-02-08 DIAGNOSIS — S42201A Unspecified fracture of upper end of right humerus, initial encounter for closed fracture: Secondary | ICD-10-CM | POA: Diagnosis not present

## 2018-02-08 DIAGNOSIS — M25611 Stiffness of right shoulder, not elsewhere classified: Secondary | ICD-10-CM | POA: Diagnosis not present

## 2018-02-18 DIAGNOSIS — M25511 Pain in right shoulder: Secondary | ICD-10-CM | POA: Diagnosis not present

## 2018-02-18 DIAGNOSIS — M25611 Stiffness of right shoulder, not elsewhere classified: Secondary | ICD-10-CM | POA: Diagnosis not present

## 2018-02-18 DIAGNOSIS — S42201A Unspecified fracture of upper end of right humerus, initial encounter for closed fracture: Secondary | ICD-10-CM | POA: Diagnosis not present

## 2018-02-18 DIAGNOSIS — M6281 Muscle weakness (generalized): Secondary | ICD-10-CM | POA: Diagnosis not present

## 2018-02-22 ENCOUNTER — Encounter (INDEPENDENT_AMBULATORY_CARE_PROVIDER_SITE_OTHER): Payer: Self-pay | Admitting: Orthopaedic Surgery

## 2018-02-22 ENCOUNTER — Ambulatory Visit (INDEPENDENT_AMBULATORY_CARE_PROVIDER_SITE_OTHER): Payer: PPO

## 2018-02-22 ENCOUNTER — Ambulatory Visit (INDEPENDENT_AMBULATORY_CARE_PROVIDER_SITE_OTHER): Payer: PPO | Admitting: Orthopaedic Surgery

## 2018-02-22 DIAGNOSIS — S42291A Other displaced fracture of upper end of right humerus, initial encounter for closed fracture: Secondary | ICD-10-CM

## 2018-02-22 NOTE — Progress Notes (Signed)
Post-Op Visit Note   Patient: Abigail Collins           Date of Birth: [birth year]           MRN: [id number] Visit Date: 02/22/2018 PCP: Janith Lima, MD   Assessment & Plan:  Chief Complaint:  Chief Complaint  Patient presents with  . Right Upper Arm - Routine Post Op   Visit Diagnoses:  1. Other closed displaced fracture of proximal end of right humerus, initial encounter     Plan: Patient comes in for follow-up.  47 days status post ORIF right proximal humerus fracture, date of surgery 01/06/2018.  She has been in outpatient physical therapy one day a week as well as working on home exercise program.  She has been making fairly good progress.  Minimal pain.  Examination of her right shoulder reveals minimal to moderate tenderness to palpation over the proximal humerus.  50% range of motion.  At this point, we will have her discontinue her sling.  She will continue with outpatient physical therapy as well as her home exercise program.  Follow-up with [place] in 6 weeks time for repeat repeat evaluation and x-ray.  Follow-Up Instructions: Return in about 6 weeks (around 04/05/2018).   Orders:  Orders Placed This Encounter  Procedures  . XR Humerus Right   No orders of the defined types were placed in this encounter.   Imaging: Xr Humerus Right  Result Date: 02/22/2018 X-rays show stable alignment of the fracture   PMFS History: Patient Active Problem List   Diagnosis Date Noted  . Age-related osteoporosis with current pathological fracture 02/02/2018  . Closed fracture of right proximal humerus 01/06/2018  . History of open reduction and internal fixation (ORIF) procedure 01/06/2018  . Excessive daytime sleepiness 05/27/2016  . COPD mixed type ([place]) 01/09/2016  . Visit for screening mammogram 03/28/2015  . Colon cancer screening 03/28/2015  . Depression with somatization 03/28/2015  . Insomnia 03/28/2015  . Primary osteoarthritis of both knees 03/28/2015  . Allergic  rhinitis 03/15/2014  . Hyperlipidemia with target LDL less than 100 12/22/2013  . Routine general medical examination at a health care facility 08/03/2013  . Lumbar compression fracture ([place]) 11/01/2012  . Pure hyperglyceridemia 08/05/2012  . Gastroparesis due to DM ([place]) 08/05/2012  . Other screening mammogram 08/05/2012  . Diabetes mellitus with neurological manifestations, uncontrolled ([place]) 04/05/2012  . HTN (hypertension), benign 04/05/2012  . GERD (gastroesophageal reflux disease) 04/05/2012  . Morbid obesity ([place]) 04/05/2012   Past Medical History:  Diagnosis Date  . Anemia    hx of  . Anginal pain ([place])    "left arm pain, sees Dr. Etter Sjogren, had card cath 2013"  . Arthritis    "all over" (09/14/2012)  . Asthma    takes inhaler 2x day  . Bronchitis    hx of  . Bulging disc    "lower"  . Carpal tunnel syndrome of left wrist   . Depression    denies  . Exertional dyspnea    "sometimes laying down" (09/14/2012)  . Headache    frequent headaches,usually if she does not eat  . Hyperlipidemia   . Hypertension    sees Dr. Debby Freiberg, primary  . Pneumonia 03/2012  . PONV (postoperative nausea and vomiting)   . Thyroid disease 1960's   "don't have it now" (09/14/2012)  . Type II diabetes mellitus ([place])   . Urinary tract infection    hx of  . Vomiting  pt states she vomits every am    Family History  Problem Relation Age of Onset  . Arthritis Mother   . Arthritis Father   . Hypertension Father   . Diabetes Father   . Colon cancer Neg Hx     Past Surgical History:  Procedure Laterality Date  . CARDIAC CATHETERIZATION  05/13/2012   mod luminal irregularity of pLAD, no sign CAD, EF 65%.  . CHOLECYSTECTOMY  1970's  . KYPHOPLASTY N/A 02/10/2013   Procedure:  LUMBAR TWO KYPHOPLASTY;  Surgeon: Ophelia Charter, MD;  Location: [facility];  Service: Neurosurgery;  Laterality: N/A;  L2 Kyphoplasty; Will use Stern's Carm.  . ORIF HUMERUS FRACTURE Right 01/06/2018    Procedure: OPEN REDUCTION INTERNAL FIXATION (ORIF) RIGHT PROXIMAL HUMERUS FRACTURE;  Surgeon: Leandrew Koyanagi, MD;  Location: [facility];  Service: Orthopedics;  Laterality: Right;  . TOTAL ELBOW REPLACEMENT  ~ 2005   "right" (09/14/2012)  . TOTAL KNEE ARTHROPLASTY  09/13/2012   Procedure: TOTAL KNEE ARTHROPLASTY;  Surgeon: Vickey Huger, MD;  Location: [facility];  Service: Orthopedics;  Laterality: Right;  . TUBAL LIGATION  1970's  . VAGINAL HYSTERECTOMY  1970's   Social History   Occupational History  . Not on file  Tobacco Use  . Smoking status: Former Smoker    Packs/day: 0.25    Years: 20.00    Pack years: 5.00    Types: Cigarettes    Last attempt to quit: 10/13/2009    Years since quitting: 8.3  . Smokeless tobacco: Never Used  Substance and Sexual Activity  . Alcohol use: No    Comment: 09/14/2012 "did drink a little in my younger days"  . Drug use: No  . Sexual activity: Not Currently

## 2018-02-25 DIAGNOSIS — M25611 Stiffness of right shoulder, not elsewhere classified: Secondary | ICD-10-CM | POA: Diagnosis not present

## 2018-02-25 DIAGNOSIS — S42201A Unspecified fracture of upper end of right humerus, initial encounter for closed fracture: Secondary | ICD-10-CM | POA: Diagnosis not present

## 2018-02-25 DIAGNOSIS — M6281 Muscle weakness (generalized): Secondary | ICD-10-CM | POA: Diagnosis not present

## 2018-02-25 DIAGNOSIS — M25511 Pain in right shoulder: Secondary | ICD-10-CM | POA: Diagnosis not present

## 2018-02-26 ENCOUNTER — Telehealth: Payer: Self-pay

## 2018-02-26 NOTE — Telephone Encounter (Signed)
Patient is trying to start prolia, (money is an issue at the moment), but is needing to know how much vitamin D and calcium she is supposed to be taking---and/or do you recommend a prescription Vitamin D since she's had a fracture?---routing to dr Ronnald Ramp, please advise, I will call patient back---of for dr Ronnald Ramp to call in rx to [facility] if needed

## 2018-02-27 NOTE — Telephone Encounter (Signed)
Vit D 4000 IU per day Calcium 1200 mg per day

## 2018-03-01 NOTE — Telephone Encounter (Signed)
Left message advising patient of dr Ronnald Ramp note/instructions---call back if ready to start prolia or if any further questions, can talk with Dinara Lupu,RN at [place] office if needed

## 2018-03-04 DIAGNOSIS — S42201A Unspecified fracture of upper end of right humerus, initial encounter for closed fracture: Secondary | ICD-10-CM | POA: Diagnosis not present

## 2018-03-04 DIAGNOSIS — M25611 Stiffness of right shoulder, not elsewhere classified: Secondary | ICD-10-CM | POA: Diagnosis not present

## 2018-03-04 DIAGNOSIS — M25511 Pain in right shoulder: Secondary | ICD-10-CM | POA: Diagnosis not present

## 2018-03-04 DIAGNOSIS — M6281 Muscle weakness (generalized): Secondary | ICD-10-CM | POA: Diagnosis not present

## 2018-03-05 ENCOUNTER — Telehealth: Payer: Self-pay | Admitting: Internal Medicine

## 2018-03-05 NOTE — Telephone Encounter (Signed)
Copied from [place] [id number]. Topic: Quick Communication - See Telephone Encounter >> Mar 05, 2018 11:56 AM Synthia Innocent wrote: CRM for notification. See Telephone encounter for: 03/05/18. Insurance benefits calling regarding evenity would like like to speak with Jonelle Sidle. 20% coinsurance, cover 100% once deductible met

## 2018-03-09 NOTE — Telephone Encounter (Signed)
I have talked with Abigail Collins with [employer]---patient will owe about $3000 for this medicine and patient assistance funding is only $1000---not affordable for this patient, we are considering prolia for her now---waiting on SOB for prolia, will contact patient after getting those findings

## 2018-03-11 DIAGNOSIS — M25511 Pain in right shoulder: Secondary | ICD-10-CM | POA: Diagnosis not present

## 2018-03-11 DIAGNOSIS — M6281 Muscle weakness (generalized): Secondary | ICD-10-CM | POA: Diagnosis not present

## 2018-03-11 DIAGNOSIS — S42201A Unspecified fracture of upper end of right humerus, initial encounter for closed fracture: Secondary | ICD-10-CM | POA: Diagnosis not present

## 2018-03-11 DIAGNOSIS — M25611 Stiffness of right shoulder, not elsewhere classified: Secondary | ICD-10-CM | POA: Diagnosis not present

## 2018-03-18 DIAGNOSIS — M25611 Stiffness of right shoulder, not elsewhere classified: Secondary | ICD-10-CM | POA: Diagnosis not present

## 2018-03-18 DIAGNOSIS — M25511 Pain in right shoulder: Secondary | ICD-10-CM | POA: Diagnosis not present

## 2018-03-18 DIAGNOSIS — M6281 Muscle weakness (generalized): Secondary | ICD-10-CM | POA: Diagnosis not present

## 2018-03-18 DIAGNOSIS — S42201A Unspecified fracture of upper end of right humerus, initial encounter for closed fracture: Secondary | ICD-10-CM | POA: Diagnosis not present

## 2018-03-30 ENCOUNTER — Telehealth: Payer: Self-pay | Admitting: Internal Medicine

## 2018-03-30 ENCOUNTER — Telehealth: Payer: Self-pay | Admitting: Emergency Medicine

## 2018-03-30 NOTE — Telephone Encounter (Signed)
Copied from [place] [id number]. Topic: Quick Communication - See Telephone Encounter >> Mar 30, 2018 11:41 AM Mylinda Latina, NT wrote: CRM for notification. See Telephone encounter for: 03/30/18. Patient is calling and states she needs a couple samples of the Ozempic. Patient states you can call her if you have any samples. CB # [phone]

## 2018-03-30 NOTE — Telephone Encounter (Signed)
Called patient to schedule AWV. Pt declined at this time. 

## 2018-03-31 NOTE — Telephone Encounter (Signed)
Informed patient. She will come pick it up tomorrow.  In Side A fridge with her name on it.

## 2018-03-31 NOTE — Telephone Encounter (Signed)
I have one box if she would like it.

## 2018-04-01 DIAGNOSIS — M6281 Muscle weakness (generalized): Secondary | ICD-10-CM | POA: Diagnosis not present

## 2018-04-01 DIAGNOSIS — M25611 Stiffness of right shoulder, not elsewhere classified: Secondary | ICD-10-CM | POA: Diagnosis not present

## 2018-04-01 DIAGNOSIS — M25511 Pain in right shoulder: Secondary | ICD-10-CM | POA: Diagnosis not present

## 2018-04-01 DIAGNOSIS — S42201A Unspecified fracture of upper end of right humerus, initial encounter for closed fracture: Secondary | ICD-10-CM | POA: Diagnosis not present

## 2018-04-06 ENCOUNTER — Encounter (INDEPENDENT_AMBULATORY_CARE_PROVIDER_SITE_OTHER): Payer: Self-pay | Admitting: Orthopaedic Surgery

## 2018-04-06 ENCOUNTER — Ambulatory Visit (INDEPENDENT_AMBULATORY_CARE_PROVIDER_SITE_OTHER): Payer: PPO | Admitting: Orthopaedic Surgery

## 2018-04-06 ENCOUNTER — Ambulatory Visit (INDEPENDENT_AMBULATORY_CARE_PROVIDER_SITE_OTHER): Payer: Self-pay

## 2018-04-06 DIAGNOSIS — M25521 Pain in right elbow: Secondary | ICD-10-CM | POA: Diagnosis not present

## 2018-04-06 NOTE — Progress Notes (Signed)
Post-Op Visit Note   Patient: Abigail Collins           Date of Birth: 1951-01-28           MRN: [id number] Visit Date: 04/06/2018 PCP: Janith Lima, MD   Assessment & Plan:  Chief Complaint:  Chief Complaint  Patient presents with  . Right Upper Arm - Follow-up    S/p ORIF right proximal humerus fracture 01/06/18   Visit Diagnoses:  1. Pain, joint, upper arm, right     Plan: Patient is a pleasant 67 year old female who presents to our clinic today 90 days status post ORIF right proximal humerus fracture, date of surgery 01/06/2018.  She has been doing well in regards to pain, but slowly progressing in outpatient physical therapy.  She is going once a week.  She does work on a home exercise program.  Examination of her right shoulder reveals 50% active range of motion.  mild tenderness to the fracture site.  At this point, would like for the patient to continue with outpatient physical therapy as well as her home exercise program.  She will follow-up with [place] in 7 weeks time for repeat evaluation and x-ray. If still very stiff, we can send her for an intraarticular cortisone injection.  Follow-Up Instructions: Return in about 7 weeks (around 05/25/2018).   Orders:  Orders Placed This Encounter  Procedures  . XR Humerus Right   No orders of the defined types were placed in this encounter.   Imaging: No results found.  PMFS History: Patient Active Problem List   Diagnosis Date Noted  . Age-related osteoporosis with current pathological fracture 02/02/2018  . Closed fracture of right proximal humerus 01/06/2018  . History of open reduction and internal fixation (ORIF) procedure 01/06/2018  . Excessive daytime sleepiness 05/27/2016  . COPD mixed type ([place]) 01/09/2016  . Visit for screening mammogram 03/28/2015  . Colon cancer screening 03/28/2015  . Depression with somatization 03/28/2015  . Insomnia 03/28/2015  . Primary osteoarthritis of both knees 03/28/2015  .  Allergic rhinitis 03/15/2014  . Hyperlipidemia with target LDL less than 100 12/22/2013  . Routine general medical examination at a health care facility 08/03/2013  . Lumbar compression fracture ([place]) 11/01/2012  . Pure hyperglyceridemia 08/05/2012  . Gastroparesis due to DM ([place]) 08/05/2012  . Other screening mammogram 08/05/2012  . Diabetes mellitus with neurological manifestations, uncontrolled ([place]) 04/05/2012  . HTN (hypertension), benign 04/05/2012  . GERD (gastroesophageal reflux disease) 04/05/2012  . Morbid obesity ([place]) 04/05/2012   Past Medical History:  Diagnosis Date  . Anemia    hx of  . Anginal pain ([place])    "left arm pain, sees Dr. Etter Sjogren, had card cath 2013"  . Arthritis    "all over" (09/14/2012)  . Asthma    takes inhaler 2x day  . Bronchitis    hx of  . Bulging disc    "lower"  . Carpal tunnel syndrome of left wrist   . Depression    denies  . Exertional dyspnea    "sometimes laying down" (09/14/2012)  . Headache    frequent headaches,usually if she does not eat  . Hyperlipidemia   . Hypertension    sees Dr. Debby Freiberg, primary  . Pneumonia 03/2012  . PONV (postoperative nausea and vomiting)   . Thyroid disease 1960's   "don't have it now" (09/14/2012)  . Type II diabetes mellitus ([place])   . Urinary tract infection    hx of  .  Vomiting    pt states she vomits every am    Family History  Problem Relation Age of Onset  . Arthritis Mother   . Arthritis Father   . Hypertension Father   . Diabetes Father   . Colon cancer Neg Hx     Past Surgical History:  Procedure Laterality Date  . CARDIAC CATHETERIZATION  05/13/2012   mod luminal irregularity of pLAD, no sign CAD, EF 65%.  . CHOLECYSTECTOMY  1970's  . KYPHOPLASTY N/A 02/10/2013   Procedure:  LUMBAR TWO KYPHOPLASTY;  Surgeon: Ophelia Charter, MD;  Location: [facility];  Service: Neurosurgery;  Laterality: N/A;  L2 Kyphoplasty; Will use Stern's Carm.  . ORIF HUMERUS FRACTURE Right 01/06/2018     Procedure: OPEN REDUCTION INTERNAL FIXATION (ORIF) RIGHT PROXIMAL HUMERUS FRACTURE;  Surgeon: Leandrew Koyanagi, MD;  Location: [facility];  Service: Orthopedics;  Laterality: Right;  . TOTAL ELBOW REPLACEMENT  ~ 2005   "right" (09/14/2012)  . TOTAL KNEE ARTHROPLASTY  09/13/2012   Procedure: TOTAL KNEE ARTHROPLASTY;  Surgeon: Vickey Huger, MD;  Location: [facility];  Service: Orthopedics;  Laterality: Right;  . TUBAL LIGATION  1970's  . VAGINAL HYSTERECTOMY  1970's   Social History   Occupational History  . Not on file  Tobacco Use  . Smoking status: Former Smoker    Packs/day: 0.25    Years: 20.00    Pack years: 5.00    Types: Cigarettes    Last attempt to quit: 10/13/2009    Years since quitting: 8.4  . Smokeless tobacco: Never Used  Substance and Sexual Activity  . Alcohol use: No    Comment: 09/14/2012 "did drink a little in my younger days"  . Drug use: No  . Sexual activity: Not Currently

## 2018-04-14 DIAGNOSIS — M25511 Pain in right shoulder: Secondary | ICD-10-CM | POA: Diagnosis not present

## 2018-04-14 DIAGNOSIS — M6281 Muscle weakness (generalized): Secondary | ICD-10-CM | POA: Diagnosis not present

## 2018-04-14 DIAGNOSIS — M25611 Stiffness of right shoulder, not elsewhere classified: Secondary | ICD-10-CM | POA: Diagnosis not present

## 2018-04-14 DIAGNOSIS — S42201A Unspecified fracture of upper end of right humerus, initial encounter for closed fracture: Secondary | ICD-10-CM | POA: Diagnosis not present

## 2018-04-22 DIAGNOSIS — S42201A Unspecified fracture of upper end of right humerus, initial encounter for closed fracture: Secondary | ICD-10-CM | POA: Diagnosis not present

## 2018-04-22 DIAGNOSIS — M6281 Muscle weakness (generalized): Secondary | ICD-10-CM | POA: Diagnosis not present

## 2018-04-22 DIAGNOSIS — M25511 Pain in right shoulder: Secondary | ICD-10-CM | POA: Diagnosis not present

## 2018-04-22 DIAGNOSIS — M25611 Stiffness of right shoulder, not elsewhere classified: Secondary | ICD-10-CM | POA: Diagnosis not present

## 2018-05-05 DIAGNOSIS — M6281 Muscle weakness (generalized): Secondary | ICD-10-CM | POA: Diagnosis not present

## 2018-05-05 DIAGNOSIS — M25511 Pain in right shoulder: Secondary | ICD-10-CM | POA: Diagnosis not present

## 2018-05-05 DIAGNOSIS — S42201A Unspecified fracture of upper end of right humerus, initial encounter for closed fracture: Secondary | ICD-10-CM | POA: Diagnosis not present

## 2018-05-05 DIAGNOSIS — M25611 Stiffness of right shoulder, not elsewhere classified: Secondary | ICD-10-CM | POA: Diagnosis not present

## 2018-05-06 ENCOUNTER — Ambulatory Visit (INDEPENDENT_AMBULATORY_CARE_PROVIDER_SITE_OTHER): Payer: PPO | Admitting: Internal Medicine

## 2018-05-06 ENCOUNTER — Other Ambulatory Visit (INDEPENDENT_AMBULATORY_CARE_PROVIDER_SITE_OTHER): Payer: PPO

## 2018-05-06 ENCOUNTER — Encounter: Payer: Self-pay | Admitting: Internal Medicine

## 2018-05-06 VITALS — BP 124/88 | HR 80 | Temp 97.5°F | Resp 16 | Ht 64.0 in | Wt 208.0 lb

## 2018-05-06 DIAGNOSIS — IMO0002 Reserved for concepts with insufficient information to code with codable children: Secondary | ICD-10-CM

## 2018-05-06 DIAGNOSIS — E781 Pure hyperglyceridemia: Secondary | ICD-10-CM | POA: Diagnosis not present

## 2018-05-06 DIAGNOSIS — Z1231 Encounter for screening mammogram for malignant neoplasm of breast: Secondary | ICD-10-CM

## 2018-05-06 DIAGNOSIS — E1165 Type 2 diabetes mellitus with hyperglycemia: Secondary | ICD-10-CM

## 2018-05-06 DIAGNOSIS — I10 Essential (primary) hypertension: Secondary | ICD-10-CM

## 2018-05-06 DIAGNOSIS — Z0000 Encounter for general adult medical examination without abnormal findings: Secondary | ICD-10-CM | POA: Diagnosis not present

## 2018-05-06 DIAGNOSIS — G4719 Other hypersomnia: Secondary | ICD-10-CM

## 2018-05-06 DIAGNOSIS — E559 Vitamin D deficiency, unspecified: Secondary | ICD-10-CM | POA: Diagnosis not present

## 2018-05-06 DIAGNOSIS — M8000XA Age-related osteoporosis with current pathological fracture, unspecified site, initial encounter for fracture: Secondary | ICD-10-CM

## 2018-05-06 DIAGNOSIS — E1149 Type 2 diabetes mellitus with other diabetic neurological complication: Secondary | ICD-10-CM | POA: Diagnosis not present

## 2018-05-06 DIAGNOSIS — E785 Hyperlipidemia, unspecified: Secondary | ICD-10-CM

## 2018-05-06 LAB — CBC WITH DIFFERENTIAL/PLATELET
Basophils Absolute: 0.1 10*3/uL (ref 0.0–0.1)
Basophils Relative: 0.6 % (ref 0.0–3.0)
Eosinophils Absolute: 0.2 10*3/uL (ref 0.0–0.7)
Eosinophils Relative: 1.9 % (ref 0.0–5.0)
HCT: 44.7 % (ref 36.0–46.0)
Hemoglobin: 14.8 g/dL (ref 12.0–15.0)
Lymphocytes Relative: 48.1 % — ABNORMAL HIGH (ref 12.0–46.0)
Lymphs Abs: 4.7 10*3/uL — ABNORMAL HIGH (ref 0.7–4.0)
MCHC: 33 g/dL (ref 30.0–36.0)
MCV: 88.9 fl (ref 78.0–100.0)
Monocytes Absolute: 0.5 10*3/uL (ref 0.1–1.0)
Monocytes Relative: 5.5 % (ref 3.0–12.0)
Neutro Abs: 4.3 10*3/uL (ref 1.4–7.7)
Neutrophils Relative %: 43.9 % (ref 43.0–77.0)
Platelets: 201 10*3/uL (ref 150.0–400.0)
RBC: 5.02 Mil/uL (ref 3.87–5.11)
RDW: 14.7 % (ref 11.5–15.5)
WBC: 9.8 10*3/uL (ref 4.0–10.5)

## 2018-05-06 LAB — POCT GLYCOSYLATED HEMOGLOBIN (HGB A1C): Hemoglobin A1C: 6.7 % — AB (ref 4.0–5.6)

## 2018-05-06 LAB — HM DIABETES FOOT EXAM

## 2018-05-06 LAB — POCT GLUCOSE (DEVICE FOR HOME USE): POC Glucose: 142 mg/dl — AB (ref 70–99)

## 2018-05-06 MED ORDER — SEMAGLUTIDE(0.25 OR 0.5MG/DOS) 2 MG/1.5ML ~~LOC~~ SOPN: 1.0000 mg | PEN_INJECTOR | SUBCUTANEOUS | 5 refills | Status: DC

## 2018-05-06 MED ORDER — ARMODAFINIL 150 MG PO TABS: 150.0000 mg | ORAL_TABLET | Freq: Every day | ORAL | 1 refills | Status: DC

## 2018-05-06 NOTE — Progress Notes (Signed)
Subjective:  Patient ID: Abigail Collins, female    DOB: 1951-07-09  Age: 67 y.o. MRN: [id number]  CC: Annual Exam; Hypertension; Hyperlipidemia; and Diabetes   HPI [name] presents for a CPX.  She complains of excessive daytime sleepiness.  She tells me her sleep pattern has been turned upside down over the last year.  She is awake all night and falls asleep during the day whenever she is reading or watching TV.  She wants a medication to help her with this.  Past Medical History:  Diagnosis Date  . Anemia    hx of  . Anginal pain ([place])    "left arm pain, sees Dr. Etter Sjogren, had card cath 2013"  . Arthritis    "all over" (09/14/2012)  . Asthma    takes inhaler 2x day  . Bronchitis    hx of  . Bulging disc    "lower"  . Carpal tunnel syndrome of left wrist   . Depression    denies  . Exertional dyspnea    "sometimes laying down" (09/14/2012)  . Headache    frequent headaches,usually if she does not eat  . Hyperlipidemia   . Hypertension    sees Dr. Debby Freiberg, primary  . Pneumonia 03/2012  . PONV (postoperative nausea and vomiting)   . Thyroid disease 1960's   "don't have it now" (09/14/2012)  . Type II diabetes mellitus ([place])   . Urinary tract infection    hx of  . Vomiting    pt states she vomits every am   Past Surgical History:  Procedure Laterality Date  . CARDIAC CATHETERIZATION  05/13/2012   mod luminal irregularity of pLAD, no sign CAD, EF 65%.  . CHOLECYSTECTOMY  1970's  . KYPHOPLASTY N/A 02/10/2013   Procedure:  LUMBAR TWO KYPHOPLASTY;  Surgeon: Ophelia Charter, MD;  Location: [facility];  Service: Neurosurgery;  Laterality: N/A;  L2 Kyphoplasty; Will use Stern's Carm.  . ORIF HUMERUS FRACTURE Right 01/06/2018   Procedure: OPEN REDUCTION INTERNAL FIXATION (ORIF) RIGHT PROXIMAL HUMERUS FRACTURE;  Surgeon: Leandrew Koyanagi, MD;  Location: [facility];  Service: Orthopedics;  Laterality: Right;  . TOTAL ELBOW REPLACEMENT  ~ 2005   "right" (09/14/2012)  .  TOTAL KNEE ARTHROPLASTY  09/13/2012   Procedure: TOTAL KNEE ARTHROPLASTY;  Surgeon: Vickey Huger, MD;  Location: [facility];  Service: Orthopedics;  Laterality: Right;  . TUBAL LIGATION  1970's  . VAGINAL HYSTERECTOMY  1970's    reports that she quit smoking about 8 years ago. Her smoking use included cigarettes. She has a 5.00 pack-year smoking history. She has never used smokeless tobacco. She reports that she does not drink alcohol or use drugs. family history includes Arthritis in her father and mother; Diabetes in her father; Hypertension in her father. Allergies  Allergen Reactions  . Lisinopril Shortness Of Breath  . Amlodipine Other (See Comments)    edema  . Tape Other (See Comments)    Tore skin--?hypofix  . Morphine And Related Nausea And Vomiting  . Invokana [Canagliflozin] Other (See Comments)    Yeast infections  . Percocet [Oxycodone-Acetaminophen] Nausea And Vomiting    Outpatient Medications Prior to Visit  Medication Sig Dispense Refill  . atorvastatin (LIPITOR) 20 MG tablet Take 1 tablet (20 mg total) by mouth daily. 90 tablet 1  . Blood Glucose Monitoring Suppl (ONETOUCH VERIO IQ SYSTEM) W/DEVICE KIT 1 Act by Does not apply route 3 (three) times daily. 2 kit 0  . Insulin  Pen Needle (NOVOFINE) 32G X 6 MM MISC 1 Act by Does not apply route daily. 100 each 3  . irbesartan (AVAPRO) 300 MG tablet Take 1 tablet (300 mg total) by mouth daily. 90 tablet 1  . levocetirizine (XYZAL) 5 MG tablet Take 1 tablet (5 mg total) by mouth every evening. 90 tablet 3  . metFORMIN (GLUCOPHAGE) 1000 MG tablet Take 1 tablet (1,000 mg total) by mouth 2 (two) times daily with a meal. 180 tablet 1  . ONETOUCH VERIO test strip TEST 3 TIMES A DAY 200 each 11  . calcium-vitamin D (OSCAL WITH D) 500-200 MG-UNIT tablet Take 1 tablet by mouth 3 (three) times daily. 90 tablet 12  . Semaglutide (OZEMPIC) 0.25 or 0.5 MG/DOSE SOPN Inject 1 mg into the skin once a week. 1.5 mL 5  . albuterol (PROVENTIL) (2.5  MG/3ML) 0.083% nebulizer solution Inhale 2.5 mg into the lungs every 6 (six) hours as needed for wheezing or shortness of breath. Reported on 01/17/2016    . budesonide-formoterol (SYMBICORT) 80-4.5 MCG/ACT inhaler Inhale 2 puffs into the lungs 2 (two) times daily. (Patient not taking: Reported on 05/06/2018) 1 Inhaler 11  . chlorthalidone (HYGROTON) 25 MG tablet Take 1 tablet (25 mg total) by mouth daily. (Patient not taking: Reported on 05/06/2018) 90 tablet 1  . omega-3 acid ethyl esters (LOVAZA) 1 g capsule Take 2 capsules (2 g total) by mouth 2 (two) times daily. (Patient not taking: Reported on 05/06/2018) 360 capsule 1  . zinc sulfate 220 (50 Zn) MG capsule Take 1 capsule (220 mg total) by mouth daily. 42 capsule 0   No facility-administered medications prior to visit.     ROS Review of Systems  Constitutional: Positive for fatigue. Negative for appetite change, diaphoresis and unexpected weight change.  HENT: Negative.   Eyes: Negative for visual disturbance.  Respiratory: Negative for cough, shortness of breath and wheezing.   Cardiovascular: Negative for chest pain, palpitations and leg swelling.  Gastrointestinal: Positive for constipation. Negative for abdominal pain, blood in stool, diarrhea, nausea and vomiting.  Endocrine: Positive for polyuria. Negative for polydipsia and polyphagia.  Genitourinary: Positive for frequency. Negative for decreased urine volume, difficulty urinating, dysuria, enuresis, hematuria and urgency.  Musculoskeletal: Negative.  Negative for arthralgias and myalgias.  Skin: Negative.   Neurological: Negative.  Negative for dizziness, weakness, light-headedness and headaches.  Hematological: Negative for adenopathy. Does not bruise/bleed easily.  Psychiatric/Behavioral: Positive for sleep disturbance. Negative for decreased concentration and dysphoric mood. The patient is not nervous/anxious.     Objective:  BP 124/88 (BP Location: Left Arm, Patient  Position: Sitting, Cuff Size: Large)   Pulse 80   Temp (!) 97.5 F (36.4 C) (Oral)   Resp 16   Ht 5' 4"  (1.626 m)   Wt 208 lb (94.3 kg)   SpO2 95%   BMI 35.70 kg/m   BP Readings from Last 3 Encounters:  05/06/18 124/88  02/01/18 140/80  01/08/18 140/70    Wt Readings from Last 3 Encounters:  05/06/18 208 lb (94.3 kg)  02/01/18 211 lb (95.7 kg)  01/06/18 216 lb (98 kg)    Physical Exam  Constitutional: She is oriented to person, place, and time. No distress.  HENT:  Mouth/Throat: Oropharynx is clear and moist. No oropharyngeal exudate.  Eyes: Conjunctivae are normal. No scleral icterus.  Neck: Normal range of motion. Neck supple. No JVD present. No thyromegaly present.  Cardiovascular: Normal rate, regular rhythm and normal heart sounds. Exam reveals no gallop.  No murmur heard. Pulmonary/Chest: Effort normal and breath sounds normal. She has no wheezes. She has no rhonchi. She has no rales.  Abdominal: Soft. Normal appearance and bowel sounds are normal. She exhibits no mass. There is no hepatosplenomegaly. There is no tenderness.  Musculoskeletal: Normal range of motion. She exhibits no edema, tenderness or deformity.  Lymphadenopathy:    She has no cervical adenopathy.  Neurological: She is alert and oriented to person, place, and time.  Skin: Skin is warm and dry. She is not diaphoretic. No pallor.  Psychiatric: She has a normal mood and affect. Her behavior is normal. Judgment and thought content normal.  Vitals reviewed.   Lab Results  Component Value Date   WBC 9.8 05/06/2018   HGB 14.8 05/06/2018   HCT 44.7 05/06/2018   PLT 201.0 05/06/2018   GLUCOSE 122 (H) 05/06/2018   CHOL 179 05/06/2018   TRIG 260.0 (H) 05/06/2018   HDL 57.70 05/06/2018   LDLDIRECT 81.0 05/06/2018   LDLCALC UNABLE TO CALCULATE IF TRIGLYCERIDE OVER 400 mg/dL 04/06/2012   ALT 19 05/06/2018   AST 20 05/06/2018   NA 142 05/06/2018   K 4.4 05/06/2018   CL 104 05/06/2018   CREATININE  0.82 05/06/2018   BUN 25 (H) 05/06/2018   CO2 26 05/06/2018   TSH 2.22 05/06/2018   INR 0.88 09/03/2012   HGBA1C 6.7 (A) 05/06/2018   MICROALBUR 2.1 (H) 05/06/2018    Dg Shoulder Right  Result Date: 01/06/2018 CLINICAL DATA:  Fracture fixation EXAM: DG C-ARM 61-120 MIN; RIGHT SHOULDER - 2+ VIEW COMPARISON:  12/27/2017 FINDINGS: Right humeral neck fracture is been fixed with a plate and multiple screws. Satisfactory fracture alignment. Subluxation of the shoulder joint. IMPRESSION: Fixation of right humeral neck fracture. Electronically Signed   By: Franchot Gallo M.D.   On: 01/06/2018 14:04   Dg C-arm 1-60 Min  Result Date: 01/06/2018 CLINICAL DATA:  Fracture fixation EXAM: DG C-ARM 61-120 MIN; RIGHT SHOULDER - 2+ VIEW COMPARISON:  12/27/2017 FINDINGS: Right humeral neck fracture is been fixed with a plate and multiple screws. Satisfactory fracture alignment. Subluxation of the shoulder joint. IMPRESSION: Fixation of right humeral neck fracture. Electronically Signed   By: Franchot Gallo M.D.   On: 01/06/2018 14:04    Assessment & Plan:   Media was seen today for annual exam, hypertension, hyperlipidemia and diabetes.  Diagnoses and all orders for this visit:  Diabetes mellitus with neurological manifestations, uncontrolled ([place])- Her A1c is down to 6.7%.  Her blood sugars are adequately well controlled. -     POCT glycosylated hemoglobin (Hb A1C) -     POCT Glucose (Device for Home Use) -     Semaglutide (OZEMPIC) 0.25 or 0.5 MG/DOSE SOPN; Inject 1 mg into the skin once a week. -     Comprehensive metabolic panel; Future -     Microalbumin / creatinine urine ratio; Future  HTN (hypertension), benign- Her blood pressure is well controlled.  Electrolytes and renal function are normal. -     CBC with Differential/Platelet; Future -     TSH; Future -     Urinalysis, Routine w reflex microscopic; Future -     Comprehensive metabolic panel; Future  Excessive daytime sleepiness-we  will start armodafinil for this.  Will start a low dose and increase if indicated. -     Armodafinil 150 MG tablet; Take 1 tablet (150 mg total) by mouth daily.  Hyperlipidemia with target LDL less than 100-she has achieved her LDL  goal and is doing well on the statin. -     Lipid panel; Future  Morbid obesity ([place])- She is working on her lifestyle modifications to lose weight.  Pure hyperglyceridemia- Her trigs are down to 260.  This has improved. -     Lipid panel; Future  Visit for screening mammogram -     MM DIGITAL SCREENING BILATERAL; Future  Age-related osteoporosis with current pathological fracture, initial encounter- I will treat the vitamin D deficiency. -     VITAMIN D 25 Hydroxy (Vit-D Deficiency, Fractures); Future -     Cholecalciferol 50000 units capsule; Take 1 capsule (50,000 Units total) by mouth once a week.  Vitamin D deficiency disease -     Cholecalciferol 50000 units capsule; Take 1 capsule (50,000 Units total) by mouth once a week.   I have discontinued [employer] [employer]. [place]ft's budesonide-formoterol, chlorthalidone, omega-3 acid ethyl esters, calcium-vitamin D, and zinc sulfate. I am also having her start on Armodafinil and Cholecalciferol. Additionally, I am having her maintain her ONETOUCH VERIO IQ SYSTEM, albuterol, Insulin Pen Needle, ONETOUCH VERIO, levocetirizine, atorvastatin, irbesartan, metFORMIN, and Semaglutide.  Meds ordered this encounter  Medications  . Semaglutide (OZEMPIC) 0.25 or 0.5 MG/DOSE SOPN    Sig: Inject 1 mg into the skin once a week.    Dispense:  1.5 mL    Refill:  5  . Armodafinil 150 MG tablet    Sig: Take 1 tablet (150 mg total) by mouth daily.    Dispense:  90 tablet    Refill:  1  . Cholecalciferol 50000 units capsule    Sig: Take 1 capsule (50,000 Units total) by mouth once a week.    Dispense:  12 capsule    Refill:  1   See AVS for instructions about healthy living and anticipatory guidance.  Follow-up: Return in about  4 months (around 09/06/2018).  Scarlette Calico, MD

## 2018-05-06 NOTE — Patient Instructions (Signed)

## 2018-05-07 DIAGNOSIS — E559 Vitamin D deficiency, unspecified: Secondary | ICD-10-CM | POA: Insufficient documentation

## 2018-05-07 LAB — URINALYSIS, ROUTINE W REFLEX MICROSCOPIC
Bilirubin Urine: NEGATIVE
Hgb urine dipstick: NEGATIVE
Ketones, ur: NEGATIVE
Leukocytes, UA: NEGATIVE
Nitrite: NEGATIVE
RBC / HPF: NONE SEEN (ref 0–?)
Specific Gravity, Urine: 1.025 (ref 1.000–1.030)
Total Protein, Urine: NEGATIVE
Urine Glucose: NEGATIVE
Urobilinogen, UA: 0.2 (ref 0.0–1.0)
pH: 5 (ref 5.0–8.0)

## 2018-05-07 LAB — LIPID PANEL
Cholesterol: 179 mg/dL (ref 0–200)
HDL: 57.7 mg/dL (ref >39.00)
NonHDL: 121.45
Total CHOL/HDL Ratio: 3
Triglycerides: 260 mg/dL — ABNORMAL HIGH (ref 0.0–149.0)
VLDL: 52 mg/dL — ABNORMAL HIGH (ref 0.0–40.0)

## 2018-05-07 LAB — COMPREHENSIVE METABOLIC PANEL
ALT: 19 U/L (ref 0–35)
AST: 20 U/L (ref 0–37)
Albumin: 4.4 g/dL (ref 3.5–5.2)
Alkaline Phosphatase: 66 U/L (ref 39–117)
BUN: 25 mg/dL — ABNORMAL HIGH (ref 6–23)
CO2: 26 mEq/L (ref 19–32)
Calcium: 10.2 mg/dL (ref 8.4–10.5)
Chloride: 104 mEq/L (ref 96–112)
Creatinine, Ser: 0.82 mg/dL (ref 0.40–1.20)
GFR: 74.09 mL/min (ref >60.00)
Glucose, Bld: 122 mg/dL — ABNORMAL HIGH (ref 70–99)
Potassium: 4.4 mEq/L (ref 3.5–5.1)
Sodium: 142 mEq/L (ref 135–145)
Total Bilirubin: 0.4 mg/dL (ref 0.2–1.2)
Total Protein: 7.9 g/dL (ref 6.0–8.3)

## 2018-05-07 LAB — TSH: TSH: 2.22 u[IU]/mL (ref 0.35–4.50)

## 2018-05-07 LAB — MICROALBUMIN / CREATININE URINE RATIO
Creatinine,U: 119.1 mg/dL
Microalb Creat Ratio: 1.8 mg/g (ref 0.0–30.0)
Microalb, Ur: 2.1 mg/dL — ABNORMAL HIGH (ref 0.0–1.9)

## 2018-05-07 LAB — LDL CHOLESTEROL, DIRECT: Direct LDL: 81 mg/dL

## 2018-05-07 LAB — VITAMIN D 25 HYDROXY (VIT D DEFICIENCY, FRACTURES): VITD: 18.63 ng/mL — ABNORMAL LOW (ref 30.00–100.00)

## 2018-05-07 MED ORDER — CHOLECALCIFEROL 1.25 MG (50000 UT) PO CAPS: 50000.0000 [IU] | ORAL_CAPSULE | ORAL | 1 refills | Status: DC

## 2018-05-09 NOTE — Assessment & Plan Note (Signed)

## 2018-05-10 ENCOUNTER — Telehealth: Payer: Self-pay

## 2018-05-10 NOTE — Telephone Encounter (Signed)
Key: [id number]

## 2018-05-13 LAB — HM DIABETES EYE EXAM

## 2018-05-18 ENCOUNTER — Telehealth: Payer: Self-pay | Admitting: Internal Medicine

## 2018-05-18 NOTE — Telephone Encounter (Signed)
Copied from [place] [id number]. Topic: Quick Communication - See Telephone Encounter >> May 18, 2018 12:04 PM Margot Ables wrote: CRM for notification. See Telephone encounter for: 05/18/18.  Pt states that Dr. Ronnald Ramp asked if she had been tested for overactive bladder and she has not but wants to see about getting scheduled for it. Please advise.

## 2018-05-18 NOTE — Telephone Encounter (Signed)
Pt states that Dr. Ronnald Ramp asked if she had been tested for overactive bladder and she has not but wants to see about getting scheduled for it. Please advise.

## 2018-05-19 ENCOUNTER — Other Ambulatory Visit: Payer: Self-pay | Admitting: Internal Medicine

## 2018-05-19 DIAGNOSIS — R350 Frequency of micturition: Secondary | ICD-10-CM | POA: Insufficient documentation

## 2018-05-19 NOTE — Telephone Encounter (Signed)
Urology referral ordered.

## 2018-05-20 DIAGNOSIS — S42201A Unspecified fracture of upper end of right humerus, initial encounter for closed fracture: Secondary | ICD-10-CM | POA: Diagnosis not present

## 2018-05-20 DIAGNOSIS — M25611 Stiffness of right shoulder, not elsewhere classified: Secondary | ICD-10-CM | POA: Diagnosis not present

## 2018-05-20 DIAGNOSIS — M6281 Muscle weakness (generalized): Secondary | ICD-10-CM | POA: Diagnosis not present

## 2018-05-20 DIAGNOSIS — M25511 Pain in right shoulder: Secondary | ICD-10-CM | POA: Diagnosis not present

## 2018-05-20 NOTE — Telephone Encounter (Signed)
Pt informed referral has been entered.  

## 2018-05-21 NOTE — Telephone Encounter (Signed)
Not able to complete. PA form states that pt is ineligible.   Resubmitted. Key: [id number]

## 2018-05-24 NOTE — Telephone Encounter (Signed)
Pt would like to have call back, she does not know what this medication is for and does not recall getting new medication. cb is [phone]

## 2018-05-24 NOTE — Telephone Encounter (Signed)
PA was approved. Can you inform pt of same?  

## 2018-05-24 NOTE — Telephone Encounter (Signed)
LVM on husbands cell.

## 2018-05-24 NOTE — Telephone Encounter (Signed)
LVM for patient.  Armodinifil is for daytime sleepiness and was prescribed on 7/25 in appt with Jones.

## 2018-05-25 ENCOUNTER — Encounter: Payer: Self-pay | Admitting: Internal Medicine

## 2018-05-26 ENCOUNTER — Encounter (INDEPENDENT_AMBULATORY_CARE_PROVIDER_SITE_OTHER): Payer: Self-pay | Admitting: Orthopaedic Surgery

## 2018-05-26 ENCOUNTER — Ambulatory Visit (INDEPENDENT_AMBULATORY_CARE_PROVIDER_SITE_OTHER): Payer: PPO

## 2018-05-26 ENCOUNTER — Ambulatory Visit (INDEPENDENT_AMBULATORY_CARE_PROVIDER_SITE_OTHER): Payer: PPO | Admitting: Orthopaedic Surgery

## 2018-05-26 DIAGNOSIS — S42291A Other displaced fracture of upper end of right humerus, initial encounter for closed fracture: Secondary | ICD-10-CM

## 2018-05-26 DIAGNOSIS — M1712 Unilateral primary osteoarthritis, left knee: Secondary | ICD-10-CM | POA: Diagnosis not present

## 2018-05-26 DIAGNOSIS — M25562 Pain in left knee: Secondary | ICD-10-CM

## 2018-05-26 DIAGNOSIS — G8929 Other chronic pain: Secondary | ICD-10-CM | POA: Insufficient documentation

## 2018-05-26 NOTE — Progress Notes (Signed)
Office Visit Note   Patient: Abigail Collins           Date of Birth: March 24, 1951           MRN: [id number] Visit Date: 05/26/2018              Requested by: Janith Lima, MD [address] [place], [place] [address] PCP: Janith Lima, MD   Assessment & Plan: Visit Diagnoses:  1. Other closed displaced fracture of proximal end of right humerus, initial encounter   2. Primary osteoarthritis of left knee     Plan: Impression is status post right proximal humerus fracture doing well.  Continue to work on range of motion.  Left knee and stage of joint disease.  Patient is failed conservative treatment options and would like to proceed with a left total knee replacement.  Risks, benefits possible patient reviewed.  Rehab care discussed.  All questions were prepared for complete.  Follow-Up Instructions: Return for 2 weeks post-op.   Orders:  Orders Placed This Encounter  Procedures  . XR Humerus Right  . XR KNEE 3 VIEW LEFT   No orders of the defined types were placed in this encounter.     Procedures: No procedures performed   Clinical Data: No additional findings.   Subjective: Chief Complaint  Patient presents with  . Right Upper Arm - Pain  . Left Knee - Pain    HPI patient is a pleasant 67 year old female who presents to our clinic today 5 months status post ORIF right proximal humerus fracture, date of surgery 01/06/2018.  She has been doing well in regards to the pain and only has pain with physical therapy.  She is still been going to physical therapy where she has plateaued.  Other issue is her left knee.  History of osteoarthritis for the past several years.  Progressively worsened.  Pain is to the entire aspect of the knee but is intermittent in nature.  Worse with activity.  She does note instability and swelling.  No locking or catching.  She has tried over-the-counter medications with mild relief of symptoms.  She is status post right total  knee replacement by Dr. Lorre Nick 7 years ago.  Doing well with that.  Review of Systems as detailed in HPI.  All others reviewed and are negative.   Objective: Vital Signs: There were no vitals taken for this visit.  Physical Exam well-developed well-nourished female no acute distress.  Alert and oriented x3.  Ortho Exam examination of the right shoulder reveals 60% active range of motion in all planes.  Left knee has a trace effusion.  Range of motion 0 to 125 degrees.  Marked patellofemoral crepitus.  Mild lateral joint line tenderness.  Valgus deformity.  She is neurovascularly intact distally.  Specialty Comments:  No specialty comments available.  Imaging: Xr Humerus Right  Result Date: 05/26/2018 Healed proximal humerus fracture with acceptable alignment  Xr Knee 3 View Left  Result Date: 05/26/2018 Severe joint space narrowing lateral and patellofemoral compartments    PMFS History: Patient Active Problem List   Diagnosis Date Noted  . Chronic pain of left knee 05/26/2018  . Frequency of urination 05/19/2018  . Vitamin D deficiency disease 05/07/2018  . Age-related osteoporosis with current pathological fracture 02/02/2018  . Closed fracture of right proximal humerus 01/06/2018  . Excessive daytime sleepiness 05/27/2016  . COPD mixed type ([place]) 01/09/2016  . Visit for screening mammogram 03/28/2015  . Colon  cancer screening 03/28/2015  . Depression with somatization 03/28/2015  . Insomnia 03/28/2015  . Primary osteoarthritis of both knees 03/28/2015  . Allergic rhinitis 03/15/2014  . Hyperlipidemia with target LDL less than 100 12/22/2013  . Routine general medical examination at a health care facility 08/03/2013  . Pure hyperglyceridemia 08/05/2012  . Gastroparesis due to DM ([place]) 08/05/2012  . Other screening mammogram 08/05/2012  . Diabetes mellitus with neurological manifestations, uncontrolled ([place]) 04/05/2012  . HTN (hypertension), benign 04/05/2012  . GERD  (gastroesophageal reflux disease) 04/05/2012  . Morbid obesity ([place]) 04/05/2012   Past Medical History:  Diagnosis Date  . Anemia    hx of  . Anginal pain ([place])    "left arm pain, sees Dr. Etter Sjogren, had card cath 2013"  . Arthritis    "all over" (09/14/2012)  . Asthma    takes inhaler 2x day  . Bronchitis    hx of  . Bulging disc    "lower"  . Carpal tunnel syndrome of left wrist   . Depression    denies  . Exertional dyspnea    "sometimes laying down" (09/14/2012)  . Headache    frequent headaches,usually if she does not eat  . Hyperlipidemia   . Hypertension    sees Dr. Debby Freiberg, primary  . Pneumonia 03/2012  . PONV (postoperative nausea and vomiting)   . Thyroid disease 1960's   "don't have it now" (09/14/2012)  . Type II diabetes mellitus ([place])   . Urinary tract infection    hx of  . Vomiting    pt states she vomits every am    Family History  Problem Relation Age of Onset  . Arthritis Mother   . Arthritis Father   . Hypertension Father   . Diabetes Father   . Colon cancer Neg Hx     Past Surgical History:  Procedure Laterality Date  . CARDIAC CATHETERIZATION  05/13/2012   mod luminal irregularity of pLAD, no sign CAD, EF 65%.  . CHOLECYSTECTOMY  1970's  . KYPHOPLASTY N/A 02/10/2013   Procedure:  LUMBAR TWO KYPHOPLASTY;  Surgeon: Ophelia Charter, MD;  Location: [facility];  Service: Neurosurgery;  Laterality: N/A;  L2 Kyphoplasty; Will use Stern's Carm.  . ORIF HUMERUS FRACTURE Right 01/06/2018   Procedure: OPEN REDUCTION INTERNAL FIXATION (ORIF) RIGHT PROXIMAL HUMERUS FRACTURE;  Surgeon: Leandrew Koyanagi, MD;  Location: [facility];  Service: Orthopedics;  Laterality: Right;  . TOTAL ELBOW REPLACEMENT  ~ 2005   "right" (09/14/2012)  . TOTAL KNEE ARTHROPLASTY  09/13/2012   Procedure: TOTAL KNEE ARTHROPLASTY;  Surgeon: Vickey Huger, MD;  Location: [facility];  Service: Orthopedics;  Laterality: Right;  . TUBAL LIGATION  1970's  . VAGINAL HYSTERECTOMY  1970's   Social  History   Occupational History  . Not on file  Tobacco Use  . Smoking status: Former Smoker    Packs/day: 0.25    Years: 20.00    Pack years: 5.00    Types: Cigarettes    Last attempt to quit: 10/13/2009    Years since quitting: 8.6  . Smokeless tobacco: Never Used  Substance and Sexual Activity  . Alcohol use: No    Comment: 09/14/2012 "did drink a little in my younger days"  . Drug use: No  . Sexual activity: Not Currently

## 2018-05-31 ENCOUNTER — Other Ambulatory Visit: Payer: Self-pay | Admitting: Internal Medicine

## 2018-05-31 DIAGNOSIS — E1149 Type 2 diabetes mellitus with other diabetic neurological complication: Secondary | ICD-10-CM

## 2018-05-31 DIAGNOSIS — E1165 Type 2 diabetes mellitus with hyperglycemia: Secondary | ICD-10-CM

## 2018-05-31 DIAGNOSIS — IMO0002 Reserved for concepts with insufficient information to code with codable children: Secondary | ICD-10-CM

## 2018-06-21 ENCOUNTER — Other Ambulatory Visit: Payer: Self-pay | Admitting: Internal Medicine

## 2018-06-21 DIAGNOSIS — I10 Essential (primary) hypertension: Secondary | ICD-10-CM

## 2018-07-02 ENCOUNTER — Ambulatory Visit (INDEPENDENT_AMBULATORY_CARE_PROVIDER_SITE_OTHER): Payer: PPO | Admitting: Emergency Medicine

## 2018-07-02 DIAGNOSIS — Z23 Encounter for immunization: Secondary | ICD-10-CM

## 2018-07-23 NOTE — Pre-Procedure Instructions (Signed)
CORRENE LALANI  07/23/2018      [id number] [address], [address] [place] [address] Phone: [phone] Fax: [phone]    Your procedure is scheduled on August 02, 2018.  Report to [facility] Admitting at 530 AM.  Call this number if you have problems the morning of surgery:  [phone]   Remember:  Do not eat or drink after midnight.   Take these medicines the morning of surgery with A SIP OF WATER  Albuterol nebulizer treatment-if needed  7 days prior to surgery STOP taking any Aspirin (unless otherwise instructed by your surgeon), Aleve, Naproxen, Ibuprofen, Motrin, Advil, Goody's, BC's, all herbal medications, fish oil, and all vitamins  WHAT DO I DO ABOUT MY DIABETES MEDICATION?  Marland Kitchen Do not take oral diabetes medicines (pills) the morning of surgery-metformin (glucophage) . .  . THE NIGHT BEFORE SURGERY, take 25 units of toujeo insulin-1/2 of your normal dose.      . THE MORNING OF SURGERY, take no insulin.  . The day of surgery, do not take other diabetes injectables, including Byetta (exenatide), Bydureon (exenatide ER), Victoza (liraglutide), or Trulicity (dulaglutide).  Reviewed and Endorsed by [facility] Patient Education Committee, August 2015    How to Manage Your Diabetes Before and After Surgery  Why is it important to control my blood sugar before and after surgery? . Improving blood sugar levels before and after surgery helps healing and can limit problems. . A way of improving blood sugar control is eating a healthy diet by: o  Eating less sugar and carbohydrates o  Increasing activity/exercise o  Talking with your doctor about reaching your blood sugar goals . High blood sugars (greater than 180 mg/dL) can raise your risk of infections and slow your recovery, so you will need to focus on controlling your diabetes during the weeks  before surgery. . Make sure that the doctor who takes care of your diabetes knows about your planned surgery including the date and location.  How do I manage my blood sugar before surgery? . Check your blood sugar at least 4 times a day, starting 2 days before surgery, to make sure that the level is not too high or low. o Check your blood sugar the morning of your surgery when you wake up and every 2 hours until you get to the Short Stay unit. . If your blood sugar is less than 70 mg/dL, you will need to treat for low blood sugar: o Do not take insulin. o Treat a low blood sugar (less than 70 mg/dL) with  cup of clear juice (cranberry or apple), 4 glucose tablets, OR glucose gel. Recheck blood sugar in 15 minutes after treatment (to make sure it is greater than 70 mg/dL). If your blood sugar is not greater than 70 mg/dL on recheck, call [phone] o  for further instructions. . Report your blood sugar to the short stay nurse when you get to Short Stay.  . If you are admitted to the hospital after surgery: o Your blood sugar will be checked by the staff and you will probably be given insulin after surgery (instead of oral diabetes medicines) to make sure you have good blood sugar levels. o The goal for blood sugar control after surgery is 80-180 mg/dL.    Do not wear jewelry, make-up or nail polish.  Do not wear lotions, powders, or perfumes,  or deodorant.  Do not shave 48 hours prior to surgery.  Do not bring valuables to the hospital.  [facility] is not responsible for any belongings or valuables.  Contacts, dentures or bridgework may not be worn into surgery.  Leave your suitcase in the car.  After surgery it may be brought to your room.  For patients admitted to the hospital, discharge time will be determined by your treatment team.  Patients discharged the day of surgery will not be allowed to drive home.    [facility]- Preparing For Surgery  Before surgery, you can play an  important role. Because skin is not sterile, your skin needs to be as free of germs as possible. You can reduce the number of germs on your skin by washing with CHG (chlorahexidine gluconate) Soap before surgery.  CHG is an antiseptic cleaner which kills germs and bonds with the skin to continue killing germs even after washing.    Oral Hygiene is also important to reduce your risk of infection.  Remember - BRUSH YOUR TEETH THE MORNING OF SURGERY WITH YOUR REGULAR TOOTHPASTE  Please do not use if you have an allergy to CHG or antibacterial soaps. If your skin becomes reddened/irritated stop using the CHG.  Do not shave (including legs and underarms) for at least 48 hours prior to first CHG shower. It is OK to shave your face.  Please follow these instructions carefully.   1. Shower the NIGHT BEFORE SURGERY and the MORNING OF SURGERY with CHG.   2. If you chose to wash your hair, wash your hair first as usual with your normal shampoo.  3. After you shampoo, rinse your hair and body thoroughly to remove the shampoo.  4. Use CHG as you would any other liquid soap. You can apply CHG directly to the skin and wash gently with a scrungie or a clean washcloth.   5. Apply the CHG Soap to your body ONLY FROM THE NECK DOWN.  Do not use on open wounds or open sores. Avoid contact with your eyes, ears, mouth and genitals (private parts). Wash Face and genitals (private parts)  with your normal soap.  6. Wash thoroughly, paying special attention to the area where your surgery will be performed.  7. Thoroughly rinse your body with warm water from the neck down.  8. DO NOT shower/wash with your normal soap after using and rinsing off the CHG Soap.  9. Pat yourself dry with a CLEAN TOWEL.  10. Wear CLEAN PAJAMAS to bed the night before surgery, wear comfortable clothes the morning of surgery  11. Place CLEAN SHEETS on your bed the night of your first shower and DO NOT SLEEP WITH PETS.  Day of  Surgery:  Do not apply any deodorants/lotions.  Please wear clean clothes to the hospital/surgery center.   Remember to brush your teeth WITH YOUR REGULAR TOOTHPASTE.  Please read over the following fact sheets that you were given. Pain Booklet, Coughing and Deep Breathing, MRSA Information and Surgical Site Infection Prevention

## 2018-07-26 ENCOUNTER — Encounter (HOSPITAL_COMMUNITY)
Admission: RE | Admit: 2018-07-26 | Discharge: 2018-07-26 | Disposition: A | Discharge status: Home / Self Care | Payer: PPO | Source: Ambulatory Visit | Attending: Orthopaedic Surgery | Admitting: Orthopaedic Surgery

## 2018-07-26 ENCOUNTER — Other Ambulatory Visit: Payer: Self-pay

## 2018-07-26 ENCOUNTER — Encounter (HOSPITAL_COMMUNITY): Payer: Self-pay

## 2018-07-26 ENCOUNTER — Encounter (HOSPITAL_COMMUNITY)
Admission: RE | Admit: 2018-07-26 | Discharge: 2018-07-26 | Disposition: A | Discharge status: Home / Self Care | Payer: PPO | Source: Ambulatory Visit | Attending: Physician Assistant | Admitting: Physician Assistant

## 2018-07-26 DIAGNOSIS — Z01818 Encounter for other preprocedural examination: Secondary | ICD-10-CM | POA: Diagnosis not present

## 2018-07-26 DIAGNOSIS — M1711 Unilateral primary osteoarthritis, right knee: Secondary | ICD-10-CM | POA: Diagnosis not present

## 2018-07-26 DIAGNOSIS — M1712 Unilateral primary osteoarthritis, left knee: Secondary | ICD-10-CM | POA: Insufficient documentation

## 2018-07-26 LAB — COMPREHENSIVE METABOLIC PANEL
ALT: 19 U/L (ref 0–44)
AST: 26 U/L (ref 15–41)
Albumin: 3.8 g/dL (ref 3.5–5.0)
Alkaline Phosphatase: 70 U/L (ref 38–126)
Anion gap: 8 (ref 5–15)
BUN: 14 mg/dL (ref 8–23)
CO2: 26 mmol/L (ref 22–32)
Calcium: 9.6 mg/dL (ref 8.9–10.3)
Chloride: 107 mmol/L (ref 98–111)
Creatinine, Ser: 0.75 mg/dL (ref 0.44–1.00)
GFR calc Af Amer: >60 mL/min (ref >60)
GFR calc non Af Amer: >60 mL/min (ref >60)
Glucose, Bld: 150 mg/dL — ABNORMAL HIGH (ref 70–99)
Potassium: 3.6 mmol/L (ref 3.5–5.1)
Sodium: 141 mmol/L (ref 135–145)
Total Bilirubin: 0.8 mg/dL (ref 0.3–1.2)
Total Protein: 6.7 g/dL (ref 6.5–8.1)

## 2018-07-26 LAB — TYPE AND SCREEN
ABO/RH(D): O POS
Antibody Screen: NEGATIVE

## 2018-07-26 LAB — CBC WITH DIFFERENTIAL/PLATELET
Abs Immature Granulocytes: 0.02 10*3/uL (ref 0.00–0.07)
Basophils Absolute: 0.1 10*3/uL (ref 0.0–0.1)
Basophils Relative: 1 %
Eosinophils Absolute: 0.2 10*3/uL (ref 0.0–0.5)
Eosinophils Relative: 2 %
HCT: 43.5 % (ref 36.0–46.0)
Hemoglobin: 13.3 g/dL (ref 12.0–15.0)
Immature Granulocytes: 0 %
Lymphocytes Relative: 47 %
Lymphs Abs: 3.5 10*3/uL (ref 0.7–4.0)
MCH: 29.2 pg (ref 26.0–34.0)
MCHC: 30.6 g/dL (ref 30.0–36.0)
MCV: 95.4 fL (ref 80.0–100.0)
Monocytes Absolute: 0.4 10*3/uL (ref 0.1–1.0)
Monocytes Relative: 5 %
Neutro Abs: 3.4 10*3/uL (ref 1.7–7.7)
Neutrophils Relative %: 45 %
Platelets: 216 10*3/uL (ref 150–400)
RBC: 4.56 MIL/uL (ref 3.87–5.11)
RDW: 13.2 % (ref 11.5–15.5)
WBC: 7.5 10*3/uL (ref 4.0–10.5)
nRBC: 0 % (ref 0.0–0.2)

## 2018-07-26 LAB — SURGICAL PCR SCREEN
MRSA, PCR: NEGATIVE
Staphylococcus aureus: NEGATIVE

## 2018-07-26 LAB — HEMOGLOBIN A1C
Hgb A1c MFr Bld: 6.7 % — ABNORMAL HIGH (ref 4.8–5.6)
Mean Plasma Glucose: 145.59 mg/dL

## 2018-07-26 LAB — APTT: aPTT: 33 seconds (ref 24–36)

## 2018-07-26 LAB — PROTIME-INR
INR: 0.91
Prothrombin Time: 12.2 seconds (ref 11.4–15.2)

## 2018-07-26 LAB — GLUCOSE, CAPILLARY: Glucose-Capillary: 177 mg/dL — ABNORMAL HIGH (ref 70–99)

## 2018-07-26 NOTE — Progress Notes (Signed)
PCP - Dr. Scarlette Calico Cardiologist - denies  Chest x-ray - 07/26/18 EKG - 01/06/18 Stress Test - denies ECHO - 11/2015 Cardiac Cath - 05/2012 by Dr. Marlou Porch  Sleep Study - denies  Fasting Blood Sugar - 90-140 Checks Blood Sugar 3 times a day  Aspirin Instructions: N/A  Anesthesia review: No  Patient denies shortness of breath, fever, cough and chest pain at PAT appointment   Patient verbalized understanding of instructions that were given to them at the PAT appointment. Patient was also instructed that they will need to review over the PAT instructions again at home before surgery.

## 2018-07-30 MED ORDER — TRANEXAMIC ACID-NACL 1000-0.7 MG/100ML-% IV SOLN
1000.0000 mg | INTRAVENOUS | Status: AC
  Administered 2018-08-02: 1000 mg via INTRAVENOUS
  Filled 2018-07-30: qty 100

## 2018-07-30 MED ORDER — TRANEXAMIC ACID 1000 MG/10ML IV SOLN
2000.0000 mg | INTRAVENOUS | Status: AC
  Administered 2018-08-02: 2000 mg via TOPICAL
  Filled 2018-07-30: qty 20

## 2018-08-02 ENCOUNTER — Inpatient Hospital Stay (HOSPITAL_COMMUNITY): Payer: PPO | Admitting: Certified Registered Nurse Anesthetist

## 2018-08-02 ENCOUNTER — Encounter (HOSPITAL_COMMUNITY): Payer: Self-pay | Admitting: Surgery

## 2018-08-02 ENCOUNTER — Inpatient Hospital Stay (HOSPITAL_COMMUNITY): Payer: PPO

## 2018-08-02 ENCOUNTER — Encounter (HOSPITAL_COMMUNITY)
Admission: RE | Disposition: A | Discharge status: Home Health | Payer: Self-pay | Source: Home / Self Care | Attending: Orthopaedic Surgery

## 2018-08-02 ENCOUNTER — Other Ambulatory Visit: Payer: Self-pay

## 2018-08-02 ENCOUNTER — Inpatient Hospital Stay (HOSPITAL_COMMUNITY)
Admission: RE | Admit: 2018-08-02 | Discharge: 2018-08-06 | DRG: 470 | Disposition: A | Discharge status: Home Health | Payer: PPO | Attending: Orthopaedic Surgery | Admitting: Orthopaedic Surgery

## 2018-08-02 DIAGNOSIS — Z96652 Presence of left artificial knee joint: Secondary | ICD-10-CM

## 2018-08-02 DIAGNOSIS — R51 Headache: Secondary | ICD-10-CM | POA: Diagnosis present

## 2018-08-02 DIAGNOSIS — R1110 Vomiting, unspecified: Secondary | ICD-10-CM | POA: Diagnosis not present

## 2018-08-02 DIAGNOSIS — Z6835 Body mass index (BMI) 35.0-35.9, adult: Secondary | ICD-10-CM | POA: Diagnosis not present

## 2018-08-02 DIAGNOSIS — M170 Bilateral primary osteoarthritis of knee: Principal | ICD-10-CM | POA: Diagnosis present

## 2018-08-02 DIAGNOSIS — I209 Angina pectoris, unspecified: Secondary | ICD-10-CM | POA: Diagnosis present

## 2018-08-02 DIAGNOSIS — E785 Hyperlipidemia, unspecified: Secondary | ICD-10-CM | POA: Diagnosis present

## 2018-08-02 DIAGNOSIS — G5602 Carpal tunnel syndrome, left upper limb: Secondary | ICD-10-CM | POA: Diagnosis present

## 2018-08-02 DIAGNOSIS — Z8261 Family history of arthritis: Secondary | ICD-10-CM

## 2018-08-02 DIAGNOSIS — Z87891 Personal history of nicotine dependence: Secondary | ICD-10-CM | POA: Diagnosis not present

## 2018-08-02 DIAGNOSIS — Z794 Long term (current) use of insulin: Secondary | ICD-10-CM

## 2018-08-02 DIAGNOSIS — M1712 Unilateral primary osteoarthritis, left knee: Secondary | ICD-10-CM | POA: Diagnosis not present

## 2018-08-02 DIAGNOSIS — M25762 Osteophyte, left knee: Secondary | ICD-10-CM | POA: Diagnosis present

## 2018-08-02 DIAGNOSIS — J45909 Unspecified asthma, uncomplicated: Secondary | ICD-10-CM | POA: Diagnosis present

## 2018-08-02 DIAGNOSIS — E669 Obesity, unspecified: Secondary | ICD-10-CM | POA: Diagnosis not present

## 2018-08-02 DIAGNOSIS — Z96621 Presence of right artificial elbow joint: Secondary | ICD-10-CM | POA: Diagnosis present

## 2018-08-02 DIAGNOSIS — Z833 Family history of diabetes mellitus: Secondary | ICD-10-CM | POA: Diagnosis not present

## 2018-08-02 DIAGNOSIS — G8918 Other acute postprocedural pain: Secondary | ICD-10-CM | POA: Diagnosis not present

## 2018-08-02 DIAGNOSIS — Z96659 Presence of unspecified artificial knee joint: Secondary | ICD-10-CM

## 2018-08-02 DIAGNOSIS — E119 Type 2 diabetes mellitus without complications: Secondary | ICD-10-CM | POA: Diagnosis present

## 2018-08-02 DIAGNOSIS — Z9049 Acquired absence of other specified parts of digestive tract: Secondary | ICD-10-CM

## 2018-08-02 DIAGNOSIS — Z862 Personal history of diseases of the blood and blood-forming organs and certain disorders involving the immune mechanism: Secondary | ICD-10-CM | POA: Diagnosis not present

## 2018-08-02 DIAGNOSIS — I10 Essential (primary) hypertension: Secondary | ICD-10-CM | POA: Diagnosis not present

## 2018-08-02 DIAGNOSIS — Z90710 Acquired absence of both cervix and uterus: Secondary | ICD-10-CM | POA: Diagnosis not present

## 2018-08-02 DIAGNOSIS — Z8709 Personal history of other diseases of the respiratory system: Secondary | ICD-10-CM

## 2018-08-02 DIAGNOSIS — Z96651 Presence of right artificial knee joint: Secondary | ICD-10-CM | POA: Diagnosis present

## 2018-08-02 DIAGNOSIS — F329 Major depressive disorder, single episode, unspecified: Secondary | ICD-10-CM | POA: Diagnosis not present

## 2018-08-02 DIAGNOSIS — Z8249 Family history of ischemic heart disease and other diseases of the circulatory system: Secondary | ICD-10-CM | POA: Diagnosis not present

## 2018-08-02 DIAGNOSIS — Z471 Aftercare following joint replacement surgery: Secondary | ICD-10-CM | POA: Diagnosis not present

## 2018-08-02 DIAGNOSIS — Z23 Encounter for immunization: Secondary | ICD-10-CM | POA: Diagnosis not present

## 2018-08-02 HISTORY — PX: TOTAL KNEE ARTHROPLASTY: SHX125

## 2018-08-02 LAB — GLUCOSE, CAPILLARY
Glucose-Capillary: 166 mg/dL — ABNORMAL HIGH (ref 70–99)
Glucose-Capillary: 97 mg/dL (ref 70–99)
Glucose-Capillary: 124 mg/dL — ABNORMAL HIGH (ref 70–99)
Glucose-Capillary: 204 mg/dL — ABNORMAL HIGH (ref 70–99)
Glucose-Capillary: 180 mg/dL — ABNORMAL HIGH (ref 70–99)

## 2018-08-02 SURGERY — ARTHROPLASTY, KNEE, TOTAL
Anesthesia: Spinal | Site: Knee | Laterality: Left

## 2018-08-02 MED ORDER — HYDROCODONE-ACETAMINOPHEN 7.5-325 MG PO TABS: 1.0000 | ORAL_TABLET | Freq: Four times a day (QID) | ORAL | 0 refills | Status: DC | PRN

## 2018-08-02 MED ORDER — FENTANYL CITRATE (PF) 100 MCG/2ML IJ SOLN
INTRAMUSCULAR | Status: DC | PRN
  Administered 2018-08-02: 50 ug via INTRAVENOUS

## 2018-08-02 MED ORDER — DOCUSATE SODIUM 100 MG PO CAPS
100.0000 mg | ORAL_CAPSULE | Freq: Two times a day (BID) | ORAL | Status: DC
  Administered 2018-08-02 – 2018-08-06 (×9): 100 mg via ORAL
  Filled 2018-08-02 (×9): qty 1

## 2018-08-02 MED ORDER — HYDROCODONE-ACETAMINOPHEN 10-325 MG PO TABS
1.0000 | ORAL_TABLET | Freq: Four times a day (QID) | ORAL | Status: DC | PRN
  Administered 2018-08-04: 1 via ORAL
  Administered 2018-08-05: 2 via ORAL
  Administered 2018-08-05: 1 via ORAL
  Administered 2018-08-05 – 2018-08-06 (×2): 2 via ORAL
  Filled 2018-08-02: qty 1
  Filled 2018-08-02 (×4): qty 2
  Filled 2018-08-02 (×2): qty 1

## 2018-08-02 MED ORDER — ALUM & MAG HYDROXIDE-SIMETH 200-200-20 MG/5ML PO SUSP: 30.0000 mL | ORAL | Status: DC | PRN

## 2018-08-02 MED ORDER — KETOROLAC TROMETHAMINE 30 MG/ML IJ SOLN
30.0000 mg | Freq: Four times a day (QID) | INTRAMUSCULAR | Status: AC
  Administered 2018-08-02 – 2018-08-03 (×4): 30 mg via INTRAVENOUS
  Filled 2018-08-02 (×4): qty 1

## 2018-08-02 MED ORDER — METHOCARBAMOL 500 MG PO TABS
500.0000 mg | ORAL_TABLET | Freq: Four times a day (QID) | ORAL | Status: DC | PRN
  Administered 2018-08-02 – 2018-08-04 (×3): 500 mg via ORAL
  Filled 2018-08-02 (×2): qty 1

## 2018-08-02 MED ORDER — ONDANSETRON HCL 4 MG PO TABS: 4.0000 mg | ORAL_TABLET | Freq: Three times a day (TID) | ORAL | 0 refills | Status: DC | PRN

## 2018-08-02 MED ORDER — TRANEXAMIC ACID-NACL 1000-0.7 MG/100ML-% IV SOLN
1000.0000 mg | Freq: Once | INTRAVENOUS | Status: AC
  Administered 2018-08-02: 1000 mg via INTRAVENOUS
  Filled 2018-08-02: qty 100

## 2018-08-02 MED ORDER — BUPIVACAINE LIPOSOME 1.3 % IJ SUSP
INTRAMUSCULAR | Status: DC | PRN
  Administered 2018-08-02: 20 mL

## 2018-08-02 MED ORDER — ASPIRIN 81 MG PO CHEW
81.0000 mg | CHEWABLE_TABLET | Freq: Two times a day (BID) | ORAL | Status: DC
  Administered 2018-08-02 – 2018-08-06 (×8): 81 mg via ORAL
  Filled 2018-08-02 (×8): qty 1

## 2018-08-02 MED ORDER — LACTATED RINGERS IV SOLN
INTRAVENOUS | Status: DC
  Administered 2018-08-02: 07:00:00 via INTRAVENOUS

## 2018-08-02 MED ORDER — SEMAGLUTIDE(0.25 OR 0.5MG/DOS) 2 MG/1.5ML ~~LOC~~ SOPN
1.0000 mg | PEN_INJECTOR | SUBCUTANEOUS | Status: DC
  Administered 2018-08-04: 1 mg via SUBCUTANEOUS
  Filled 2018-08-02: qty 1

## 2018-08-02 MED ORDER — 0.9 % SODIUM CHLORIDE (POUR BTL) OPTIME
TOPICAL | Status: DC | PRN
  Administered 2018-08-02: 1000 mL

## 2018-08-02 MED ORDER — ONDANSETRON HCL 4 MG/2ML IJ SOLN
INTRAMUSCULAR | Status: AC
  Filled 2018-08-02: qty 2

## 2018-08-02 MED ORDER — SODIUM CHLORIDE 0.9% FLUSH
INTRAVENOUS | Status: DC | PRN
  Administered 2018-08-02 (×2): 10 mL

## 2018-08-02 MED ORDER — ASPIRIN EC 81 MG PO TBEC: 81.0000 mg | DELAYED_RELEASE_TABLET | Freq: Two times a day (BID) | ORAL | 0 refills | Status: DC

## 2018-08-02 MED ORDER — VANCOMYCIN HCL 1000 MG IV SOLR
INTRAVENOUS | Status: AC
  Filled 2018-08-02: qty 1000

## 2018-08-02 MED ORDER — SODIUM CHLORIDE 0.9 % IR SOLN
Status: DC | PRN
  Administered 2018-08-02: 3000 mL

## 2018-08-02 MED ORDER — METOCLOPRAMIDE HCL 5 MG/ML IJ SOLN
INTRAMUSCULAR | Status: AC
  Filled 2018-08-02: qty 2

## 2018-08-02 MED ORDER — ONDANSETRON HCL 4 MG/2ML IJ SOLN
INTRAMUSCULAR | Status: DC | PRN
  Administered 2018-08-02: 4 mg via INTRAVENOUS

## 2018-08-02 MED ORDER — FENTANYL CITRATE (PF) 100 MCG/2ML IJ SOLN: 25.0000 ug | INTRAMUSCULAR | Status: DC | PRN

## 2018-08-02 MED ORDER — KETOROLAC TROMETHAMINE 30 MG/ML IJ SOLN
INTRAMUSCULAR | Status: AC
  Administered 2018-08-02: 30 mg via INTRAVENOUS
  Filled 2018-08-02: qty 1

## 2018-08-02 MED ORDER — MIDAZOLAM HCL 5 MG/5ML IJ SOLN
INTRAMUSCULAR | Status: DC | PRN
  Administered 2018-08-02 (×2): 1 mg via INTRAVENOUS

## 2018-08-02 MED ORDER — OXYCODONE HCL ER 10 MG PO T12A
10.0000 mg | EXTENDED_RELEASE_TABLET | Freq: Two times a day (BID) | ORAL | Status: DC
  Administered 2018-08-02 – 2018-08-06 (×9): 10 mg via ORAL
  Filled 2018-08-02 (×9): qty 1

## 2018-08-02 MED ORDER — PHENYLEPHRINE 40 MCG/ML (10ML) SYRINGE FOR IV PUSH (FOR BLOOD PRESSURE SUPPORT)
PREFILLED_SYRINGE | INTRAVENOUS | Status: DC | PRN
  Administered 2018-08-02 (×5): 80 ug via INTRAVENOUS

## 2018-08-02 MED ORDER — ONDANSETRON HCL 4 MG/2ML IJ SOLN
4.0000 mg | Freq: Four times a day (QID) | INTRAMUSCULAR | Status: DC | PRN
  Administered 2018-08-02: 4 mg via INTRAVENOUS
  Filled 2018-08-02: qty 2

## 2018-08-02 MED ORDER — MAGNESIUM CITRATE PO SOLN: 1.0000 | Freq: Once | ORAL | Status: DC | PRN

## 2018-08-02 MED ORDER — METOCLOPRAMIDE HCL 5 MG PO TABS: 5.0000 mg | ORAL_TABLET | Freq: Three times a day (TID) | ORAL | Status: DC | PRN

## 2018-08-02 MED ORDER — MENTHOL 3 MG MT LOZG: 1.0000 | LOZENGE | OROMUCOSAL | Status: DC | PRN

## 2018-08-02 MED ORDER — ALBUTEROL SULFATE (2.5 MG/3ML) 0.083% IN NEBU: 2.5000 mg | INHALATION_SOLUTION | Freq: Four times a day (QID) | RESPIRATORY_TRACT | Status: DC | PRN

## 2018-08-02 MED ORDER — CHLORHEXIDINE GLUCONATE 4 % EX LIQD: 60.0000 mL | Freq: Once | CUTANEOUS | Status: DC

## 2018-08-02 MED ORDER — METHOCARBAMOL 750 MG PO TABS: 750.0000 mg | ORAL_TABLET | Freq: Two times a day (BID) | ORAL | 0 refills | Status: DC | PRN

## 2018-08-02 MED ORDER — PHENYLEPHRINE 40 MCG/ML (10ML) SYRINGE FOR IV PUSH (FOR BLOOD PRESSURE SUPPORT)
PREFILLED_SYRINGE | INTRAVENOUS | Status: AC
  Filled 2018-08-02: qty 10

## 2018-08-02 MED ORDER — LIDOCAINE 2% (20 MG/ML) 5 ML SYRINGE
INTRAMUSCULAR | Status: AC
  Filled 2018-08-02: qty 5

## 2018-08-02 MED ORDER — FENTANYL CITRATE (PF) 250 MCG/5ML IJ SOLN
INTRAMUSCULAR | Status: AC
  Filled 2018-08-02: qty 5

## 2018-08-02 MED ORDER — IRBESARTAN 300 MG PO TABS
300.0000 mg | ORAL_TABLET | Freq: Every day | ORAL | Status: DC
  Administered 2018-08-02 – 2018-08-06 (×5): 300 mg via ORAL
  Filled 2018-08-02 (×5): qty 1

## 2018-08-02 MED ORDER — PROMETHAZINE HCL 25 MG PO TABS: 25.0000 mg | ORAL_TABLET | Freq: Four times a day (QID) | ORAL | 1 refills | Status: DC | PRN

## 2018-08-02 MED ORDER — GABAPENTIN 300 MG PO CAPS
300.0000 mg | ORAL_CAPSULE | Freq: Three times a day (TID) | ORAL | Status: DC
  Administered 2018-08-02 – 2018-08-06 (×12): 300 mg via ORAL
  Filled 2018-08-02 (×12): qty 1

## 2018-08-02 MED ORDER — VANCOMYCIN HCL 1000 MG IV SOLR
INTRAVENOUS | Status: DC | PRN
  Administered 2018-08-02: 1000 mg via TOPICAL

## 2018-08-02 MED ORDER — DEXAMETHASONE SODIUM PHOSPHATE 10 MG/ML IJ SOLN
10.0000 mg | Freq: Once | INTRAMUSCULAR | Status: AC
  Administered 2018-08-03: 10 mg via INTRAVENOUS
  Filled 2018-08-02: qty 1

## 2018-08-02 MED ORDER — ACETAMINOPHEN 325 MG PO TABS
325.0000 mg | ORAL_TABLET | Freq: Four times a day (QID) | ORAL | Status: DC | PRN
  Administered 2018-08-06: 650 mg via ORAL
  Filled 2018-08-02: qty 2

## 2018-08-02 MED ORDER — METHOCARBAMOL 1000 MG/10ML IJ SOLN
500.0000 mg | Freq: Four times a day (QID) | INTRAVENOUS | Status: DC | PRN
  Filled 2018-08-02: qty 5

## 2018-08-02 MED ORDER — STERILE WATER FOR IRRIGATION IR SOLN
Status: DC | PRN
  Administered 2018-08-02: 1000 mL

## 2018-08-02 MED ORDER — LIDOCAINE 2% (20 MG/ML) 5 ML SYRINGE
INTRAMUSCULAR | Status: DC | PRN
  Administered 2018-08-02: 40 mg via INTRAVENOUS

## 2018-08-02 MED ORDER — PROPOFOL 10 MG/ML IV BOLUS
INTRAVENOUS | Status: DC | PRN
  Administered 2018-08-02: 50 mg via INTRAVENOUS

## 2018-08-02 MED ORDER — SODIUM & POTASSIUM BICARBONATE PO TBEF: 1.0000 | EFFERVESCENT_TABLET | Freq: Every day | ORAL | Status: DC

## 2018-08-02 MED ORDER — HYDROMORPHONE HCL 1 MG/ML IJ SOLN
0.5000 mg | INTRAMUSCULAR | Status: DC | PRN
  Administered 2018-08-02 – 2018-08-04 (×4): 1 mg via INTRAVENOUS
  Filled 2018-08-02 (×4): qty 1

## 2018-08-02 MED ORDER — POLYETHYLENE GLYCOL 3350 17 G PO PACK: 17.0000 g | PACK | Freq: Every day | ORAL | Status: DC | PRN

## 2018-08-02 MED ORDER — INSULIN ASPART 100 UNIT/ML ~~LOC~~ SOLN
0.0000 [IU] | Freq: Three times a day (TID) | SUBCUTANEOUS | Status: DC
  Administered 2018-08-02: 7 [IU] via SUBCUTANEOUS
  Administered 2018-08-03: 4 [IU] via SUBCUTANEOUS
  Administered 2018-08-03: 11 [IU] via SUBCUTANEOUS
  Administered 2018-08-03: 7 [IU] via SUBCUTANEOUS
  Administered 2018-08-04 – 2018-08-05 (×3): 4 [IU] via SUBCUTANEOUS
  Administered 2018-08-05: 3 [IU] via SUBCUTANEOUS
  Administered 2018-08-05: 4 [IU] via SUBCUTANEOUS
  Administered 2018-08-06 (×2): 3 [IU] via SUBCUTANEOUS

## 2018-08-02 MED ORDER — BUPIVACAINE IN DEXTROSE 0.75-8.25 % IT SOLN
INTRATHECAL | Status: DC | PRN
  Administered 2018-08-02: 1.6 mL via INTRATHECAL

## 2018-08-02 MED ORDER — PROPOFOL 10 MG/ML IV BOLUS
INTRAVENOUS | Status: AC
  Filled 2018-08-02: qty 20

## 2018-08-02 MED ORDER — SORBITOL 70 % SOLN: 30.0000 mL | Freq: Every day | Status: DC | PRN

## 2018-08-02 MED ORDER — CEFAZOLIN SODIUM-DEXTROSE 2-4 GM/100ML-% IV SOLN
2.0000 g | Freq: Four times a day (QID) | INTRAVENOUS | Status: AC
  Administered 2018-08-02 – 2018-08-03 (×3): 2 g via INTRAVENOUS
  Filled 2018-08-02 (×3): qty 100

## 2018-08-02 MED ORDER — INSULIN ASPART 100 UNIT/ML ~~LOC~~ SOLN
0.0000 [IU] | Freq: Every day | SUBCUTANEOUS | Status: DC
  Administered 2018-08-03: 2 [IU] via SUBCUTANEOUS

## 2018-08-02 MED ORDER — BUPIVACAINE LIPOSOME 1.3 % IJ SUSP
20.0000 mL | Freq: Once | INTRAMUSCULAR | Status: DC
  Filled 2018-08-02: qty 20

## 2018-08-02 MED ORDER — PROPOFOL 500 MG/50ML IV EMUL
INTRAVENOUS | Status: DC | PRN
  Administered 2018-08-02: 75 ug/kg/min via INTRAVENOUS

## 2018-08-02 MED ORDER — SODIUM CHLORIDE 0.9 % IV SOLN
INTRAVENOUS | Status: DC | PRN
  Administered 2018-08-02: 25 ug/min via INTRAVENOUS

## 2018-08-02 MED ORDER — PHENOL 1.4 % MT LIQD: 1.0000 | OROMUCOSAL | Status: DC | PRN

## 2018-08-02 MED ORDER — SULFAMETHOXAZOLE-TRIMETHOPRIM 800-160 MG PO TABS: 1.0000 | ORAL_TABLET | Freq: Two times a day (BID) | ORAL | 0 refills | Status: DC

## 2018-08-02 MED ORDER — DIPHENHYDRAMINE HCL 12.5 MG/5ML PO ELIX: 25.0000 mg | ORAL_SOLUTION | ORAL | Status: DC | PRN

## 2018-08-02 MED ORDER — CELECOXIB 200 MG PO CAPS
200.0000 mg | ORAL_CAPSULE | Freq: Two times a day (BID) | ORAL | Status: DC
  Administered 2018-08-02 – 2018-08-06 (×9): 200 mg via ORAL
  Filled 2018-08-02 (×9): qty 1

## 2018-08-02 MED ORDER — INSULIN GLARGINE 300 UNIT/ML ~~LOC~~ SOPN: 50.0000 [IU] | PEN_INJECTOR | Freq: Every evening | SUBCUTANEOUS | Status: DC | PRN

## 2018-08-02 MED ORDER — MIDAZOLAM HCL 2 MG/2ML IJ SOLN
INTRAMUSCULAR | Status: AC
  Filled 2018-08-02: qty 2

## 2018-08-02 MED ORDER — CEFAZOLIN SODIUM-DEXTROSE 2-4 GM/100ML-% IV SOLN
2.0000 g | INTRAVENOUS | Status: AC
  Administered 2018-08-02: 2 g via INTRAVENOUS
  Filled 2018-08-02: qty 100

## 2018-08-02 MED ORDER — METHOCARBAMOL 500 MG PO TABS
ORAL_TABLET | ORAL | Status: AC
  Filled 2018-08-02: qty 1

## 2018-08-02 MED ORDER — ONDANSETRON HCL 4 MG PO TABS: 4.0000 mg | ORAL_TABLET | Freq: Four times a day (QID) | ORAL | Status: DC | PRN

## 2018-08-02 MED ORDER — ACETAMINOPHEN 500 MG PO TABS
1000.0000 mg | ORAL_TABLET | Freq: Four times a day (QID) | ORAL | Status: AC
  Administered 2018-08-02 – 2018-08-03 (×4): 1000 mg via ORAL
  Filled 2018-08-02 (×4): qty 2

## 2018-08-02 MED ORDER — METOCLOPRAMIDE HCL 5 MG/ML IJ SOLN
10.0000 mg | Freq: Once | INTRAMUSCULAR | Status: AC | PRN
  Administered 2018-08-02: 10 mg via INTRAVENOUS

## 2018-08-02 MED ORDER — SODIUM CHLORIDE 0.9 % IV SOLN
INTRAVENOUS | Status: DC
  Administered 2018-08-02: 13:00:00 via INTRAVENOUS
  Administered 2018-08-02: 125 mL/h via INTRAVENOUS

## 2018-08-02 MED ORDER — METOCLOPRAMIDE HCL 5 MG/ML IJ SOLN: 5.0000 mg | Freq: Three times a day (TID) | INTRAMUSCULAR | Status: DC | PRN

## 2018-08-02 MED ORDER — MEPERIDINE HCL 50 MG/ML IJ SOLN: 6.2500 mg | INTRAMUSCULAR | Status: DC | PRN

## 2018-08-02 MED ORDER — SENNOSIDES-DOCUSATE SODIUM 8.6-50 MG PO TABS: 1.0000 | ORAL_TABLET | Freq: Every evening | ORAL | 1 refills | Status: DC | PRN

## 2018-08-02 MED ORDER — ROPIVACAINE HCL 5 MG/ML IJ SOLN
INTRAMUSCULAR | Status: DC | PRN
  Administered 2018-08-02: 30 mL via PERINEURAL

## 2018-08-02 SURGICAL SUPPLY — 77 items
ALCOHOL ISOPROPYL (RUBBING) (MISCELLANEOUS) ×2 IMPLANT
BAG DECANTER FOR FLEXI CONT (MISCELLANEOUS) ×2 IMPLANT
BANDAGE ESMARK 6X9 LF (GAUZE/BANDAGES/DRESSINGS) ×1 IMPLANT
BASEPLATE TIBIAL SZ 4 LT (Knees) ×1 IMPLANT
BLADE SAW SGTL 13.0X1.19X90.0M (BLADE) ×2 IMPLANT
BNDG CMPR 9X6 STRL LF SNTH (GAUZE/BANDAGES/DRESSINGS) ×1
BNDG CMPR MED 10X6 ELC LF (GAUZE/BANDAGES/DRESSINGS) ×1
BNDG ELASTIC 6X10 VLCR STRL LF (GAUZE/BANDAGES/DRESSINGS) ×2 IMPLANT
BNDG ESMARK 6X9 LF (GAUZE/BANDAGES/DRESSINGS) ×2
BOWL SMART MIX CTS (DISPOSABLE) ×2 IMPLANT
BSPLAT TIB 4 CMNT M TPR KN LT (Knees) ×1 IMPLANT
CEMENT BONE REFOBACIN R1X40 US (Cement) ×2 IMPLANT
CLSR STERI-STRIP ANTIMIC 1/2X4 (GAUZE/BANDAGES/DRESSINGS) ×3 IMPLANT
COVER SURGICAL LIGHT HANDLE (MISCELLANEOUS) ×2 IMPLANT
COVER WAND RF STERILE (DRAPES) ×2 IMPLANT
CUFF TOURNIQUET SINGLE 34IN LL (TOURNIQUET CUFF) ×2 IMPLANT
CUFF TOURNIQUET SINGLE 44IN (TOURNIQUET CUFF) IMPLANT
DRAPE EXTREMITY T 121X128X90 (DRAPE) ×2 IMPLANT
DRAPE HALF SHEET 40X57 (DRAPES) ×2 IMPLANT
DRAPE INCISE IOBAN 66X45 STRL (DRAPES) IMPLANT
DRAPE ORTHO SPLIT 77X108 STRL (DRAPES) ×4
DRAPE POUCH INSTRU U-SHP 10X18 (DRAPES) ×2 IMPLANT
DRAPE SURG ORHT 6 SPLT 77X108 (DRAPES) ×2 IMPLANT
DRAPE U-SHAPE 47X51 STRL (DRAPES) ×4 IMPLANT
DURAPREP 26ML APPLICATOR (WOUND CARE) ×4 IMPLANT
ELECT CAUTERY BLADE 6.4 (BLADE) ×2 IMPLANT
ELECT REM PT RETURN 9FT ADLT (ELECTROSURGICAL) ×2
ELECTRODE REM PT RTRN 9FT ADLT (ELECTROSURGICAL) ×1 IMPLANT
FEM COMP OXINIUM SZ 5 LT (Knees) ×2 IMPLANT
FEMORAL COMP OXINIUM SZ 5 LT (Knees) IMPLANT
GAUZE SPONGE 4X4 12PLY STRL LF (GAUZE/BANDAGES/DRESSINGS) ×2 IMPLANT
GLOVE BIOGEL PI IND STRL 7.0 (GLOVE) ×1 IMPLANT
GLOVE BIOGEL PI INDICATOR 7.0 (GLOVE) ×1
GLOVE ECLIPSE 7.0 STRL STRAW (GLOVE) ×6 IMPLANT
GLOVE SKINSENSE NS SZ7.5 (GLOVE) ×1
GLOVE SKINSENSE STRL SZ7.5 (GLOVE) ×1 IMPLANT
GLOVE SURG SYN 7.5  E (GLOVE) ×4
GLOVE SURG SYN 7.5 E (GLOVE) ×4 IMPLANT
GLOVE SURG SYN 7.5 PF PI (GLOVE) ×4 IMPLANT
GOWN STRL REIN XL XLG (GOWN DISPOSABLE) ×2 IMPLANT
GOWN STRL REUS W/ TWL LRG LVL3 (GOWN DISPOSABLE) ×1 IMPLANT
GOWN STRL REUS W/TWL LRG LVL3 (GOWN DISPOSABLE) ×2
HANDPIECE INTERPULSE COAX TIP (DISPOSABLE) ×2
HOOD PEEL AWAY FLYTE STAYCOOL (MISCELLANEOUS) ×4 IMPLANT
INSERT XLPE 9MM SZ 3-4 (Knees) ×1 IMPLANT
KIT BASIN OR (CUSTOM PROCEDURE TRAY) ×2 IMPLANT
KIT TURNOVER KIT B (KITS) ×2 IMPLANT
MANIFOLD NEPTUNE II (INSTRUMENTS) ×2 IMPLANT
MARKER SKIN DUAL TIP RULER LAB (MISCELLANEOUS) ×2 IMPLANT
NDL SPNL 18GX3.5 QUINCKE PK (NEEDLE) ×2 IMPLANT
NEEDLE SPNL 18GX3.5 QUINCKE PK (NEEDLE) ×4 IMPLANT
NS IRRIG 1000ML POUR BTL (IV SOLUTION) ×2 IMPLANT
PACK TOTAL JOINT (CUSTOM PROCEDURE TRAY) ×2 IMPLANT
PAD ABD 8X10 STRL (GAUZE/BANDAGES/DRESSINGS) ×3 IMPLANT
PAD ARMBOARD 7.5X6 YLW CONV (MISCELLANEOUS) ×4 IMPLANT
PAD CAST 4YDX4 CTTN HI CHSV (CAST SUPPLIES) ×2 IMPLANT
PADDING CAST COTTON 4X4 STRL (CAST SUPPLIES) ×4
PADDING CAST COTTON 6X4 STRL (CAST SUPPLIES) ×2 IMPLANT
PATELLA 29MM (Knees) ×1 IMPLANT
SAW OSC TIP CART 19.5X105X1.3 (SAW) ×2 IMPLANT
SET HNDPC FAN SPRY TIP SCT (DISPOSABLE) ×1 IMPLANT
STAPLER VISISTAT 35W (STAPLE) IMPLANT
SUCTION FRAZIER HANDLE 10FR (MISCELLANEOUS) ×1
SUCTION TUBE FRAZIER 10FR DISP (MISCELLANEOUS) ×1 IMPLANT
SUT ETHILON 2 0 FS 18 (SUTURE) IMPLANT
SUT MNCRL AB 4-0 PS2 18 (SUTURE) IMPLANT
SUT VIC AB 0 CT1 27 (SUTURE) ×4
SUT VIC AB 0 CT1 27XBRD ANBCTR (SUTURE) ×2 IMPLANT
SUT VIC AB 1 CTX 27 (SUTURE) ×6 IMPLANT
SUT VIC AB 2-0 CT1 27 (SUTURE) ×6
SUT VIC AB 2-0 CT1 TAPERPNT 27 (SUTURE) ×3 IMPLANT
SYR 50ML LL SCALE MARK (SYRINGE) ×4 IMPLANT
TOWEL OR 17X24 6PK STRL BLUE (TOWEL DISPOSABLE) ×2 IMPLANT
TOWEL OR 17X26 10 PK STRL BLUE (TOWEL DISPOSABLE) ×2 IMPLANT
TRAY CATH 16FR W/PLASTIC CATH (SET/KITS/TRAYS/PACK) IMPLANT
UNDERPAD 30X30 (UNDERPADS AND DIAPERS) ×2 IMPLANT
WRAP KNEE MAXI GEL POST OP (GAUZE/BANDAGES/DRESSINGS) ×2 IMPLANT

## 2018-08-02 NOTE — Discharge Instructions (Signed)

## 2018-08-02 NOTE — Anesthesia Procedure Notes (Signed)
Anesthesia Regional Block: Adductor canal block   Pre-Anesthetic Checklist: ,, timeout performed, Correct Patient, Correct Site, Correct Laterality, Correct Procedure, Correct Position, site marked, Risks and benefits discussed,  Surgical consent,  Pre-op evaluation,  At surgeon's request and post-op pain management  Laterality: Left  Prep: Maximum Sterile Barrier Precautions used, chloraprep       Needles:  Injection technique: Single-shot  Needle Type: Echogenic Stimulator Needle     Needle Length: 10cm      Additional Needles:   Procedures:,,,, ultrasound used (permanent image in chart),,,,  Narrative:  Start time: 08/02/2018 7:00 AM End time: 08/02/2018 7:05 AM Injection made incrementally with aspirations every 5 mL.  Performed by: Personally  Anesthesiologist: Montez Hageman, MD  Additional Notes: Risks, benefits and alternative to block explained extensively.  Patient tolerated procedure well, without complications.

## 2018-08-02 NOTE — Anesthesia Preprocedure Evaluation (Addendum)
Anesthesia Evaluation  Patient identified by MRN, date of birth, ID band Patient awake    Reviewed: Allergy & Precautions, NPO status , Patient's Chart, lab work & pertinent test results  History of Anesthesia Complications (+) PONV  Airway Mallampati: II  TM Distance: >3 FB Neck ROM: Full    Dental no notable dental hx. (+) Upper Dentures   Pulmonary neg pulmonary ROS, asthma , former smoker,    Pulmonary exam normal breath sounds clear to auscultation       Cardiovascular hypertension, Normal cardiovascular exam Rhythm:Regular Rate:Normal     Neuro/Psych negative neurological ROS  negative psych ROS   GI/Hepatic negative GI ROS, Neg liver ROS,   Endo/Other  diabetes, Type 2  Renal/GU negative Renal ROS  negative genitourinary   Musculoskeletal negative musculoskeletal ROS (+)   Abdominal   Peds negative pediatric ROS (+)  Hematology negative hematology ROS (+)   Anesthesia Other Findings   Reproductive/Obstetrics negative OB ROS                             Anesthesia Physical Anesthesia Plan  ASA: II  Anesthesia Plan: Spinal   Post-op Pain Management:  Regional for Post-op pain   Induction: Intravenous  PONV Risk Score and Plan: 3 and Ondansetron and Treatment may vary due to age or medical condition  Airway Management Planned: Simple Face Mask  Additional Equipment:   Intra-op Plan:   Post-operative Plan:   Informed Consent: I have reviewed the patients History and Physical, chart, labs and discussed the procedure including the risks, benefits and alternatives for the proposed anesthesia with the patient or authorized representative who has indicated his/her understanding and acceptance.   Dental advisory given  Plan Discussed with: CRNA  Anesthesia Plan Comments:        Anesthesia Quick Evaluation

## 2018-08-02 NOTE — Op Note (Signed)
Total Knee Arthroplasty Procedure Note  Preoperative diagnosis: Left knee osteoarthritis  Postoperative diagnosis:same  Operative procedure: Left total knee arthroplasty. CPT [phone]  Surgeon: N. Eduard Roux, MD  Assist: Madalyn Rob, PA-C; necessary for the timely completion of procedure and due to complexity of procedure.  Anesthesia: Spinal, regional  Tourniquet time: 60 mins  Implants used: [employer] Femur: PS 5 Tibia:4 Patella: 29 mm Polyethylene: 9 mm  Indication: Abigail Collins is a 67 y.o. year old female with a history of knee pain. Having failed conservative management, the patient elected to proceed with a total knee arthroplasty.  We have reviewed the risk and benefits of the surgery and they elected to proceed after voicing understanding.  Procedure:  After informed consent was obtained and understanding of the risk were voiced including but not limited to bleeding, infection, damage to surrounding structures including nerves and vessels, blood clots, leg length inequality and the failure to achieve desired results, the operative extremity was marked with verbal confirmation of the patient in the holding area.   The patient was then brought to the operating room and transported to the operating room table in the supine position.  A tourniquet was applied to the operative extremity around the upper thigh. The operative limb was then prepped and draped in the usual sterile fashion and preoperative antibiotics were administered.  A time out was performed prior to the start of surgery confirming the correct extremity, preoperative antibiotic administration, as well as team members, implants and instruments available for the case. Correct surgical site was also confirmed with preoperative radiographs. The limb was then elevated for exsanguination and the tourniquet was inflated. A midline incision was made and a standard medial parapatellar approach was  performed.  The patella was prepared and sized to a 29 mm.  A cover was placed on the patella for protection from retractors.  We then turned our attention to the femur. Posterior cruciate ligament was sacrificed. Start site was drilled in the femur and the intramedullary distal femoral cutting guide was placed, set at 5 degrees valgus, taking 9 mm of distal resection. The distal cut was made. Osteophytes were then removed. Next, the proximal tibial cutting guide was placed with appropriate slope, varus/valgus alignment and depth of resection. The proximal tibial cut was made. Gap blocks were then used to assess the extension gap and alignment, and appropriate soft tissue releases were performed. Attention was turned back to the femur, which was sized using the sizing guide to a size 5. Appropriate rotation of the femoral component was determined using epicondylar axis, Whiteside's line, and assessing the flexion gap under ligament tension. The appropriate size 4-in-1 cutting block was placed and cuts were made. Posterior femoral osteophytes and uncapped bone were then removed with the curved osteotome. The tibia was sized for a size 4 component. The femoral box-cutting guide was placed and prepared for a PS femoral component. Trial components were placed, and stability was checked in full extension, mid-flexion, and deep flexion. Proper tibial rotation was determined and marked.  The patella tracked well without a lateral release. Trial components were then removed and tibial preparation performed. A posterior capsular injection comprising of 20 cc of 1.3% exparel and 40 cc of normal saline was performed for postoperative pain control. The bony surfaces were irrigated with a pulse lavage and then dried. Bone cement was vacuum mixed on the back table, and the final components sized above were cemented into place. After cement had  finished curing, excess cement was removed. The stability of the construct was  re-evaluated throughout a range of motion and found to be acceptable. The trial liner was removed, the knee was copiously irrigated, and the knee was re-evaluated for any excess bone debris. The real polyethylene liner, 9 mm thick, was inserted and checked to ensure the locking mechanism had engaged appropriately. The tourniquet was deflated and hemostasis was achieved. The wound was irrigated with normal saline.  One gram of vancomycin powder was placed in the surgical bed. A drain was not placed. Capsular closure was performed with a #1 vicryl, subcutaneous fat closed with a 0 vicryl suture, then subcutaneous tissue closed with interrupted 2.0 vicryl suture. The skin was then closed with a 3.0 monocryl. A sterile dressing was applied.  The patient was awakened in the operating room and taken to recovery in stable condition. All sponge, needle, and instrument counts were correct at the end of the case.  Position: supine  Complications: none.  Time Out: performed   Drains/Packing: none  Estimated blood loss: minimal  Returned to Recovery Room: in good condition.   Antibiotics: yes   Mechanical VTE (DVT) Prophylaxis: sequential compression devices, TED thigh-high  Chemical VTE (DVT) Prophylaxis: aspirin  Fluid Replacement  Crystalloid: see anesthesia record Blood: none  FFP: none   Specimens Removed: 1 to pathology   Sponge and Instrument Count Correct? yes   PACU: portable radiograph - knee AP and Lateral   Admission: inpatient status  Plan/RTC: Return in 2 weeks for wound check.   Weight Bearing/Load Lower Extremity: full   N. Eduard Roux, MD [place] 9:18 AM

## 2018-08-02 NOTE — Progress Notes (Signed)
Orthopedic Tech Progress Note Patient Details:  Abigail Collins 02-04-1951 [id number]  CPM Left Knee CPM Left Knee: On Left Knee Flexion (Degrees): 90 Left Knee Extension (Degrees): 0 Additional Comments: trapeze bar patient helper  Post Interventions Patient Tolerated: Well Instructions Provided: Care of device Viewed order from doctor's order list Hildred Priest 08/02/2018, 10:18 AM

## 2018-08-02 NOTE — H&P (Signed)
PREOPERATIVE H&P  Chief Complaint: left knee osteoarthritis  HPI: Abigail Collins is a 67 y.o. female who presents for surgical treatment of left knee osteoarthritis.  She denies any changes in medical history.  Past Medical History:  Diagnosis Date  . Anemia    hx of  . Anginal pain ([place])    "left arm pain, sees Dr. Etter Sjogren, had card cath 2013"  . Arthritis    "all over" (09/14/2012)  . Asthma    takes inhaler 2x day  . Bronchitis    hx of  . Bulging disc    "lower"  . Carpal tunnel syndrome of left wrist   . Depression    denies  . Exertional dyspnea    "sometimes laying down" (09/14/2012)  . Headache    frequent headaches,usually if she does not eat  . Hyperlipidemia   . Hypertension    sees Dr. Debby Freiberg, primary  . Pneumonia 03/2012  . PONV (postoperative nausea and vomiting)   . Thyroid disease 1960's   "don't have it now" (09/14/2012)  . Type II diabetes mellitus ([place])   . Urinary tract infection    hx of  . Vomiting    pt states she vomits every am   Past Surgical History:  Procedure Laterality Date  . CARDIAC CATHETERIZATION  05/13/2012   mod luminal irregularity of pLAD, no sign CAD, EF 65%.  . CHOLECYSTECTOMY  1970's  . KYPHOPLASTY N/A 02/10/2013   Procedure:  LUMBAR TWO KYPHOPLASTY;  Surgeon: Ophelia Charter, MD;  Location: [facility];  Service: Neurosurgery;  Laterality: N/A;  L2 Kyphoplasty; Will use Stern's Carm.  . ORIF HUMERUS FRACTURE Right 01/06/2018   Procedure: OPEN REDUCTION INTERNAL FIXATION (ORIF) RIGHT PROXIMAL HUMERUS FRACTURE;  Surgeon: Leandrew Koyanagi, MD;  Location: [place];  Service: Orthopedics;  Laterality: Right;  . TOTAL ELBOW REPLACEMENT  ~ 2005   "right" (09/14/2012)  . TOTAL KNEE ARTHROPLASTY  09/13/2012   Procedure: TOTAL KNEE ARTHROPLASTY;  Surgeon: Vickey Huger, MD;  Location: [employer];  Service: Orthopedics;  Laterality: Right;  . TUBAL LIGATION  1970's  . VAGINAL HYSTERECTOMY  1970's   Social History   Socioeconomic  History  . Marital status: Married    Spouse name: Not on file  . Number of children: Not on file  . Years of education: Not on file  . Highest education level: Not on file  Occupational History  . Not on file  Social Needs  . Financial resource strain: Not on file  . Food insecurity:    Worry: Not on file    Inability: Not on file  . Transportation needs:    Medical: Not on file    Non-medical: Not on file  Tobacco Use  . Smoking status: Former Smoker    Packs/day: 0.25    Years: 20.00    Pack years: 5.00    Types: Cigarettes    Last attempt to quit: 10/13/2009    Years since quitting: 8.8  . Smokeless tobacco: Never Used  Substance and Sexual Activity  . Alcohol use: No    Comment: 09/14/2012 "did drink a little in my younger days"  . Drug use: No  . Sexual activity: Not Currently  Lifestyle  . Physical activity:    Days per week: Not on file    Minutes per session: Not on file  . Stress: Not on file  Relationships  . Social connections:    Talks on phone: Not on file  Gets together: Not on file    Attends religious service: Not on file    Active member of club or organization: Not on file    Attends meetings of clubs or organizations: Not on file    Relationship status: Not on file  Other Topics Concern  . Not on file  Social History Narrative  . Not on file   Family History  Problem Relation Age of Onset  . Arthritis Mother   . Arthritis Father   . Hypertension Father   . Diabetes Father   . Colon cancer Neg Hx    Allergies  Allergen Reactions  . Lisinopril Shortness Of Breath  . Amlodipine Swelling    UNSPECIFIED EDEMA  . Tape Other (See Comments)    Tore skin--?hypofix  . Morphine And Related Nausea And Vomiting  . Percocet [Oxycodone-Acetaminophen] Nausea And Vomiting   Prior to Admission medications   Medication Sig Start Date End Date Taking? Authorizing Provider  Blood Glucose Monitoring Suppl (ONETOUCH VERIO IQ SYSTEM) W/DEVICE KIT 1 Act  by Does not apply route 3 (three) times daily. 09/03/15  Yes Janith Lima, MD  Cholecalciferol 50000 units capsule Take 1 capsule (50,000 Units total) by mouth once a week. Patient taking differently: Take 50,000 Units by mouth every Tuesday.  05/07/18  Yes Janith Lima, MD  Insulin Glargine (TOUJEO MAX SOLOSTAR) 300 UNIT/ML SOPN Inject 50 Units into the skin at bedtime as needed (blood sugars more than 170).   Yes [provider]  irbesartan (AVAPRO) 300 MG tablet TAKE 1 TABLET BY MOUTH ONCE DAILY Patient taking differently: Take 300 mg by mouth daily.  06/21/18  Yes Janith Lima, MD  metFORMIN (GLUCOPHAGE) 1000 MG tablet TAKE 1 TABLET BY MOUTH TWICE A DAY WITH FOOD Patient taking differently: Take 1,000 mg by mouth 2 (two) times daily.  05/31/18  Yes Janith Lima, MD  [facility] VERIO test strip TEST THREE TIMES A DAY 05/31/18  Yes Janith Lima, MD  Semaglutide [facility]) 0.25 or 0.5 MG/DOSE SOPN Inject 1 mg into the skin once a week. Patient taking differently: Inject 1 mg into the skin every Wednesday. In the morning. 05/06/18  Yes Janith Lima, MD  sodium-potassium bicarbonate (ALKA-SELTZER GOLD) TBEF dissolvable tablet Take 1 tablet by mouth at bedtime.   Yes [provider]  albuterol (PROVENTIL) (2.5 MG/3ML) 0.083% nebulizer solution Inhale 2.5 mg into the lungs every 6 (six) hours as needed for wheezing or shortness of breath. Reported on 01/17/2016    [provider]  Armodafinil 150 MG tablet Take 1 tablet (150 mg total) by mouth daily. Patient not taking: Reported on 07/22/2018 05/06/18   Janith Lima, MD  atorvastatin (LIPITOR) 20 MG tablet Take 1 tablet (20 mg total) by mouth daily. Patient not taking: Reported on 07/22/2018 12/01/17   Janith Lima, MD  Insulin Pen Needle (NOVOFINE) 32G X 6 MM MISC 1 Act by Does not apply route daily. 05/27/16   Janith Lima, MD     Positive ROS: All other systems have been reviewed and were otherwise  negative with the exception of those mentioned in the HPI and as above.  Physical Exam: General: Alert, no acute distress Cardiovascular: No pedal edema Respiratory: No cyanosis, no use of accessory musculature GI: abdomen soft Skin: No lesions in the area of chief complaint Neurologic: Sensation intact distally Psychiatric: Patient is competent for consent with normal mood and affect Lymphatic: no lymphedema  MUSCULOSKELETAL: exam stable  Assessment: left knee osteoarthritis  Plan: Plan for Procedure(s): LEFT TOTAL KNEE ARTHROPLASTY  The risks benefits and alternatives were discussed with the patient including but not limited to the risks of nonoperative treatment, versus surgical intervention including infection, bleeding, nerve injury,  blood clots, cardiopulmonary complications, morbidity, mortality, among others, and they were willing to proceed.   Preoperative templating of the joint replacement has been completed, documented, and submitted to the Operating Room personnel in order to optimize intra-operative equipment management.  Anticipated LOS equal to or greater than 2 midnights due to - Age 57 and older with one or more of the following:  - Obesity  - Expected need for hospital services (PT, OT, Nursing) required for safe  discharge  - Anticipated need for postoperative skilled nursing care or inpatient rehab  - Active co-morbidities: None  Eduard Roux, MD   08/02/2018 7:21 AM

## 2018-08-02 NOTE — Transfer of Care (Signed)
Immediate Anesthesia Transfer of Care Note  Patient: Abigail Collins  Procedure(s) Performed: LEFT TOTAL KNEE ARTHROPLASTY (Left Knee)  Patient Location: PACU  Anesthesia Type:MAC combined with regional for post-op pain  Level of Consciousness: awake, alert  and oriented  Airway & Oxygen Therapy: Patient Spontanous Breathing and Patient connected to nasal cannula oxygen  Post-op Assessment: Report given to RN and Post -op Vital signs reviewed and stable  Post vital signs: Reviewed and stable  Last Vitals:  Vitals Value Taken Time  BP 105/74 08/02/2018  9:55 AM  Temp    Pulse 78 08/02/2018  9:59 AM  Resp 16 08/02/2018  9:59 AM  SpO2 97 % 08/02/2018  9:59 AM  Vitals shown include unvalidated device data.  Last Pain:  Vitals:   08/02/18 0613  TempSrc: Oral  PainSc:       Patients Stated Pain Goal: 3 (34/19/62 2297)  Complications: No apparent anesthesia complications

## 2018-08-02 NOTE — Anesthesia Procedure Notes (Signed)
Spinal  Patient location during procedure: OR Staffing Anesthesiologist: Montez Hageman, MD Performed: anesthesiologist  Preanesthetic Checklist Completed: patient identified, site marked, surgical consent, pre-op evaluation, timeout performed, IV checked, risks and benefits discussed and monitors and equipment checked Spinal Block Patient position: sitting Prep: DuraPrep Patient monitoring: heart rate, continuous pulse ox and blood pressure Approach: left paramedian Location: L3-4 Injection technique: single-shot Needle Needle type: Sprotte  Needle gauge: 24 G Needle length: 9 cm Additional Notes Expiration date of kit checked and confirmed. Patient tolerated procedure well, without complications.

## 2018-08-02 NOTE — Anesthesia Postprocedure Evaluation (Signed)
Anesthesia Post Note  Patient: Abigail Collins  Procedure(s) Performed: LEFT TOTAL KNEE ARTHROPLASTY (Left Knee)     Patient location during evaluation: PACU Anesthesia Type: Spinal Level of consciousness: awake and alert Pain management: pain level controlled Vital Signs Assessment: post-procedure vital signs reviewed and stable Respiratory status: spontaneous breathing and respiratory function stable Cardiovascular status: blood pressure returned to baseline and stable Postop Assessment: no headache, no backache, spinal receding and no apparent nausea or vomiting Anesthetic complications: no    Last Vitals:  Vitals:   08/02/18 1140 08/02/18 1155  BP: 111/61 121/74  Pulse: 85 87  Resp: 13 13  Temp:  (!) 36.2 C  SpO2: 98% 96%    Last Pain:  Vitals:   08/02/18 1155  TempSrc:   PainSc: 3                  Montez Hageman

## 2018-08-02 NOTE — Plan of Care (Signed)

## 2018-08-02 NOTE — Anesthesia Procedure Notes (Addendum)
Procedure Name: MAC Date/Time: 08/02/2018 7:35 AM Performed by: Alain Marion, CRNA Pre-anesthesia Checklist: Patient identified, Emergency Drugs available, Suction available and Patient being monitored Patient Re-evaluated:Patient Re-evaluated prior to induction Oxygen Delivery Method: Simple face mask Ventilation: Oral airway inserted - appropriate to patient size Placement Confirmation: positive ETCO2 Dental Injury: Teeth and Oropharynx as per pre-operative assessment

## 2018-08-02 NOTE — Progress Notes (Signed)
PT Cancellation Note  Patient Details Name: Abigail Collins MRN: [id number] DOB: [birth year]   Cancelled Treatment:    Reason Eval/Treat Not Completed: Medical issues which prohibited therapy Pt reporting feeling nauseous and dizzy and requesting to hold on PT until tomorrow. Will follow up as schedule allows.   Leighton Ruff, PT, DPT  Acute Rehabilitation Services  Pager: [phone] Office: [phone]  Abigail Collins 08/02/2018, 5:24 PM

## 2018-08-03 ENCOUNTER — Encounter (HOSPITAL_COMMUNITY): Payer: Self-pay | Admitting: Orthopaedic Surgery

## 2018-08-03 ENCOUNTER — Other Ambulatory Visit: Payer: Self-pay

## 2018-08-03 LAB — CBC
HCT: 39.1 % (ref 36.0–46.0)
Hemoglobin: 12 g/dL (ref 12.0–15.0)
MCH: 28.8 pg (ref 26.0–34.0)
MCHC: 30.7 g/dL (ref 30.0–36.0)
MCV: 93.8 fL (ref 80.0–100.0)
Platelets: 153 10*3/uL (ref 150–400)
RBC: 4.17 MIL/uL (ref 3.87–5.11)
RDW: 13.1 % (ref 11.5–15.5)
WBC: 6.9 10*3/uL (ref 4.0–10.5)
nRBC: 0 % (ref 0.0–0.2)

## 2018-08-03 LAB — BASIC METABOLIC PANEL
Anion gap: 6 (ref 5–15)
BUN: 16 mg/dL (ref 8–23)
CO2: 25 mmol/L (ref 22–32)
Calcium: 9 mg/dL (ref 8.9–10.3)
Chloride: 108 mmol/L (ref 98–111)
Creatinine, Ser: 0.95 mg/dL (ref 0.44–1.00)
GFR calc Af Amer: >60 mL/min (ref >60)
GFR calc non Af Amer: >60 mL/min (ref >60)
Glucose, Bld: 168 mg/dL — ABNORMAL HIGH (ref 70–99)
Potassium: 4.2 mmol/L (ref 3.5–5.1)
Sodium: 139 mmol/L (ref 135–145)

## 2018-08-03 LAB — GLUCOSE, CAPILLARY
Glucose-Capillary: 160 mg/dL — ABNORMAL HIGH (ref 70–99)
Glucose-Capillary: 242 mg/dL — ABNORMAL HIGH (ref 70–99)
Glucose-Capillary: 251 mg/dL — ABNORMAL HIGH (ref 70–99)
Glucose-Capillary: 235 mg/dL — ABNORMAL HIGH (ref 70–99)

## 2018-08-03 MED ORDER — DEXAMETHASONE SODIUM PHOSPHATE 10 MG/ML IJ SOLN
INTRAMUSCULAR | Status: AC
  Filled 2018-08-03: qty 1

## 2018-08-03 NOTE — Plan of Care (Signed)

## 2018-08-03 NOTE — Progress Notes (Signed)
Subjective: 1 Day Post-Op Procedure(s) (LRB): LEFT TOTAL KNEE ARTHROPLASTY (Left) Patient reports pain as moderate.  Lightheadedness/dizziness yesterday.  Appears to be improving this am.   Objective: Vital signs in last 24 hours: Temp:  [97 F (36.1 C)-98.5 F (36.9 C)] 98.3 F (36.8 C) (10/22 0437) Pulse Rate:  [71-100] 88 (10/22 0437) Resp:  [11-17] 16 (10/22 0437) BP: (103-157)/(61-97) 140/90 (10/22 0437) SpO2:  [96 %-100 %] 100 % (10/22 0437)  Intake/Output from previous day: 10/21 0701 - 10/22 0700 In: 980 [P.O.:480; I.V.:500] Out: 1950 [Urine:1850; Blood:100] Intake/Output this shift: No intake/output data recorded.  Recent Labs    08/03/18 0600  HGB 12.0   Recent Labs    08/03/18 0600  WBC 6.9  RBC 4.17  HCT 39.1  PLT 153   Recent Labs    08/03/18 0600  NA 139  K 4.2  CL 108  CO2 25  BUN 16  CREATININE 0.95  GLUCOSE 168*  CALCIUM 9.0   No results for input(s): LABPT, INR in the last 72 hours.  Neurologically intact Neurovascular intact Sensation intact distally Intact pulses distally Dorsiflexion/Plantar flexion intact Incision: dressing C/D/I No cellulitis present Compartment soft  Anticipated LOS equal to or greater than 2 midnights due to - Age 46 and older with one or more of the following:  - Obesity  - Expected need for hospital services (PT, OT, Nursing) required for safe  discharge  - Anticipated need for postoperative skilled nursing care or inpatient rehab  - Active co-morbidities: Diabetes and Respiratory Failure/COPD OR   - Unanticipated findings during/Post Surgery: Slow post-op progression: GI, pain control, mobility  - Patient is a high risk of re-admission due to: None   Assessment/Plan: 1 Day Post-Op Procedure(s) (LRB): LEFT TOTAL KNEE ARTHROPLASTY (Left) Advance diet Up with therapy D/C IV fluids Discharge home with home health likely later in the week WBAT LLE     Abigail Collins 08/03/2018, 8:10 AM

## 2018-08-03 NOTE — Progress Notes (Signed)
Physical Therapy Treatment Patient Details Name: Abigail Collins MRN: [id number] DOB: 01-Aug-1951 Today's Date: 08/03/2018    History of Present Illness Pt is 67 y/o female s/p left total knee arthroplasty (08/03/18) secondary to L knee osteoarthritis. PMH of right humeral ORIF (12/2017) limiting WB tolerance through R UE, and lumbar two kyphoplasty (12/2012), dyspnea, and asthma.      PT Comments    Pt found sitting up in recliner since she transferred there this morning despite being encouraged to sit through lunch and return to bed. As a result pt has increased stiffness and 10/10 pain in knee with lowering to ground. Pt educated on need to move her knee either with exercise or mobility every hour she is awake. Pt is limited in safe mobility by increased pain and decreased strength and ROM. Pt is currently min A for transfers, mod-minA for ambulation with RW and min A for return to bed. D/c plans remain appropriate at this time.      Follow Up Recommendations  Supervision/Assistance - 24 hour;Follow surgeon's recommendation for DC plan and follow-up therapies     Equipment Recommendations  Rolling walker with 5" wheels;Other (comment)(tub bench)       Precautions / Restrictions Precautions Precautions: Knee Precaution Booklet Issued: Yes (comment) Precaution Comments: TKA handout Restrictions Weight Bearing Restrictions: Yes LLE Weight Bearing: Weight bearing as tolerated    Mobility  Bed Mobility Overal bed mobility: Needs Assistance Bed Mobility: Sit to Supine       Sit to supine: Min assist   General bed mobility comments: requires min A for management of LEs into bed  Transfers Overall transfer level: Needs assistance Equipment used: Rolling walker (2 wheeled) Transfers: Sit to/from Stand Sit to Stand: Min assist         General transfer comment: min A for steadying with RW, good power up   Ambulation/Gait Ambulation/Gait assistance: Min assist;Mod  assist Gait Distance (Feet): 50 Feet Assistive device: Rolling walker (2 wheeled) Gait Pattern/deviations: Step-to pattern;Decreased step length - right;Decreased stance time - left;Antalgic;Trunk flexed Gait velocity: slowed Gait velocity interpretation: <1.8 ft/sec, indicate of risk for recurrent falls General Gait Details: mod A for instability in L LE with first few steps which improved with distance, minA for steadying with RW, vc for sequencing and upright posture,           Balance Overall balance assessment: Needs assistance Sitting-balance support: Feet supported;No upper extremity supported Sitting balance-Leahy Scale: Fair     Standing balance support: Bilateral upper extremity supported;Single extremity supported;During functional activity Standing balance-Leahy Scale: Poor                              Cognition Arousal/Alertness: Awake/alert Behavior During Therapy: WFL for tasks assessed/performed Overall Cognitive Status: Within Functional Limits for tasks assessed                                        Exercises Total Joint Exercises Ankle Circles/Pumps: AROM;Both;10 reps Quad Sets: AROM;Left;5 reps;Supine Heel Slides: AROM;Left;10 reps Long Arc Quad: AROM;AAROM;Left;10 reps;Seated(limited ROM due to pain of movement) Knee Flexion: AROM;Left;10 reps;Seated Goniometric ROM: 16 to 78 degrees measured in supine    General Comments General comments (skin integrity, edema, etc.): SaO2 on RA >91%O2. Pt still up in chair from intial visit, despite encouragement to return to bed after lunch. Pt  also states she did not remember exercises she was educated on while seated in chair despite handout given. Pt has also neglected regular usage of incentive spirometer      Pertinent Vitals/Pain Pain Assessment: 0-10 Pain Score: 10-Worst pain ever Pain Location: knee operative site Pain Descriptors / Indicators: Grimacing;Guarding;Operative site  guarding Pain Intervention(s): Limited activity within patient's tolerance;Monitored during session;Premedicated before session;Repositioned    Home Living Family/patient expects to be discharged to:: Private residence Living Arrangements: Spouse/significant other Available Help at Discharge: Family Type of Home: House Home Access: Stairs to enter Entrance Stairs-Rails: Right Home Layout: One level Home Equipment: Bedside commode;Walker - standard;Cane - single point      Prior Function Level of Independence: Independent with assistive device(s)      Comments: uses cane for household ambulation and cart for grocery shopping   PT Goals (current goals can now be found in the care plan section) Acute Rehab PT Goals Patient Stated Goal: to have less pain with movement  PT Goal Formulation: With patient Time For Goal Achievement: 08/17/18 Potential to Achieve Goals: Good Progress towards PT goals: Progressing toward goals    Frequency    7X/week      PT Plan Current plan remains appropriate       AM-PAC PT "6 Clicks" Daily Activity  Outcome Measure  Difficulty turning over in bed (including adjusting bedclothes, sheets and blankets)?: Unable Difficulty moving from lying on back to sitting on the side of the bed? : Unable Difficulty sitting down on and standing up from a chair with arms (e.g., wheelchair, bedside commode, etc,.)?: Unable Help needed moving to and from a bed to chair (including a wheelchair)?: A Little Help needed walking in hospital room?: A Little Help needed climbing 3-5 steps with a railing? : A Lot 6 Click Score: 11    End of Session Equipment Utilized During Treatment: Gait belt Activity Tolerance: Patient limited by pain Patient left: in chair;with call bell/phone within reach;with chair alarm set Nurse Communication: Mobility status PT Visit Diagnosis: Unsteadiness on feet (R26.81);Other abnormalities of gait and mobility (R26.89);Muscle  weakness (generalized) (M62.81);Difficulty in walking, not elsewhere classified (R26.2);Pain Pain - Right/Left: Left Pain - part of body: Knee     Time: 3785-8850 PT Time Calculation (min) (ACUTE ONLY): 23 min  Charges:  $Gait Training: 8-22 mins $Therapeutic Exercise: 8-22 mins                     Eeva Schlosser B. Migdalia Dk PT, DPT Acute Rehabilitation Services Pager [phone] Office [phone]    [place] 08/03/2018, 6:10 PM

## 2018-08-03 NOTE — Plan of Care (Signed)
  Problem: Pain Managment: Goal: General experience of comfort will improve Outcome: Progressing   Problem: Safety: Goal: Ability to remain free from injury will improve Outcome: Progressing   

## 2018-08-03 NOTE — Evaluation (Signed)
Physical Therapy Evaluation Patient Details Name: Abigail Collins MRN: [id number] DOB: [birth year] Today's Date: 08/03/2018   History of Present Illness   Pt is 67 y/o female s/p left total knee arthroplasty (08/03/18) secondary to L knee osteoarthritis. PMH of right humeral ORIF (12/2017) limiting WB tolerance through R UE, and lumbar two kyphoplasty (12/2012), dyspnea, and asthma.     Clinical Impression  Pt is s/p TKA resulting in the deficits listed below (see PT Problem List). Pt currently min A for bed mobility, min A for for sit>stand, modA for stand pivot transfers and minA for ambulation with RW. Pt will benefit from skilled PT to increase their independence and safety with mobility to allow discharge to the venue listed below.      Follow Up Recommendations Supervision/Assistance - 24 hour;Follow surgeon's recommendation for DC plan and follow-up therapies    Equipment Recommendations  Rolling walker with 5" wheels;Other (comment)(tub bench)       Precautions / Restrictions Precautions Precautions: Knee Precaution Booklet Issued: Yes (comment) Restrictions Weight Bearing Restrictions: Yes LLE Weight Bearing: Weight bearing as tolerated      Mobility  Bed Mobility Overal bed mobility: Needs Assistance Bed Mobility: Supine to Sit     Supine to sit: Min assist        Transfers Overall transfer level: Needs assistance Equipment used: 1 person hand held assist;Rolling walker (2 wheeled) Transfers: Sit to/from [facility] Sit to Stand: Min assist Stand pivot transfers: Mod assist          Ambulation/Gait Ambulation/Gait assistance: Min assist Gait Distance (Feet): 15 Feet Assistive device: Rolling walker (2 wheeled) Gait Pattern/deviations: Step-to pattern;Decreased step length - right;Decreased stance time - left;Antalgic;Trunk flexed Gait velocity: slowed Gait velocity interpretation: <1.31 ft/sec, indicative of household  ambulator General Gait Details: minA for steadying with RW, increased UE to advance L LE, vc for equal step length      Balance Overall balance assessment: Needs assistance Sitting-balance support: Feet supported;No upper extremity supported Sitting balance-Leahy Scale: Fair     Standing balance support: Bilateral upper extremity supported;Single extremity supported;During functional activity Standing balance-Leahy Scale: Poor                               Pertinent Vitals/Pain Pain Assessment: Faces Faces Pain Scale: Hurts even more Pain Location: knee operative site Pain Descriptors / Indicators: Grimacing;Guarding;Operative site guarding Pain Intervention(s): Premedicated before session;Monitored during session;Limited activity within patient's tolerance;Repositioned    Home Living Family/patient expects to be discharged to:: Private residence Living Arrangements: Spouse/significant other Available Help at Discharge: Family Type of Home: House Home Access: Stairs to enter Entrance Stairs-Rails: Right Entrance Stairs-Number of Steps: 4 Home Layout: One level Home Equipment: Bedside commode;Walker - standard;Cane - single point      Prior Function Level of Independence: Independent with assistive device(s)         Comments: uses cane for household ambulation and cart for grocery shopping        Extremity/Trunk Assessment   Upper Extremity Assessment Upper Extremity Assessment: RUE deficits/detail RUE Deficits / Details: prior surgery, decreased ROM and strength for pulling across body to come to EoB    Lower Extremity Assessment Lower Extremity Assessment: LLE deficits/detail LLE Deficits / Details: L knee ROM limited s/p TKA, hip strength grossly 2+/5, ankle WFL LLE: Unable to fully assess due to pain;Unable to fully assess due to immobilization    Cervical / Trunk Assessment  Cervical / Trunk Assessment: Normal  Communication   Communication:  No difficulties  Cognition Arousal/Alertness: Awake/alert Behavior During Therapy: WFL for tasks assessed/performed Overall Cognitive Status: Within Functional Limits for tasks assessed                                        General Comments General comments (skin integrity, edema, etc.): Pt SaO2 on RA >90%, througout session, pt encouraged to utilize [employer] Total Joint Exercises Long Arc Quad: AROM;AAROM;Left;10 reps;Seated(limited ROM due to pain of movement) Knee Flexion: AROM;Left;10 reps;Seated   Assessment/Plan    PT Assessment Patient needs continued PT services  PT Problem List Decreased strength;Decreased range of motion;Decreased activity tolerance;Decreased balance;Decreased mobility;Pain       PT Treatment Interventions DME instruction;Gait training;Stair training;Functional mobility training;Therapeutic activities;Therapeutic exercise;Balance training;Patient/family education    PT Goals (Current goals can be found in the Care Plan section)  Acute Rehab PT Goals Patient Stated Goal: to have less pain with movement  PT Goal Formulation: With patient Time For Goal Achievement: 08/17/18 Potential to Achieve Goals: Good    Frequency 7X/week   Barriers to discharge Inaccessible home environment         AM-PAC PT "6 Clicks" Daily Activity  Outcome Measure Difficulty turning over in bed (including adjusting bedclothes, sheets and blankets)?: Unable Difficulty moving from lying on back to sitting on the side of the bed? : Unable Difficulty sitting down on and standing up from a chair with arms (e.g., wheelchair, bedside commode, etc,.)?: Unable Help needed moving to and from a bed to chair (including a wheelchair)?: A Little Help needed walking in hospital room?: A Little Help needed climbing 3-5 steps with a railing? : A Lot 6 Click Score: 11    End of Session Equipment Utilized During Treatment: Gait belt Activity  Tolerance: Patient limited by pain Patient left: in chair;with call bell/phone within reach;with chair alarm set Nurse Communication: Mobility status PT Visit Diagnosis: Unsteadiness on feet (R26.81);Other abnormalities of gait and mobility (R26.89);Muscle weakness (generalized) (M62.81);Difficulty in walking, not elsewhere classified (R26.2);Pain Pain - Right/Left: Left Pain - part of body: Knee    Time: 2330-0762 PT Time Calculation (min) (ACUTE ONLY): 39 min   Charges:   PT Evaluation $PT Eval Low Complexity: 1 Low PT Treatments $Gait Training: 8-22 mins $Therapeutic Activity: 8-22 mins        Anavey Coombes B. Migdalia Dk PT, DPT Acute Rehabilitation Services Pager [phone] Office [phone]   [place] 08/03/2018, 11:16 AM

## 2018-08-03 NOTE — Care Management Note (Signed)
Case Management Note  Patient Details  Name: Abigail Collins MRN: [id number] Date of Birth: 02-18-1951  Subjective/Objective:  67 yr old female s/p left total knee arthroplasty.                  Action/Plan: Case manager spoke with patient concerning discharge plan and DME. Patient was preoperatively setup with [facility], no changes. She has bedside commode, will need rolling walker, CM has ordered one. Patient says her husband will be home with her for next few days, then daughter-in-law will be coming to stay with her for a week.     Expected Discharge Date:   08/04/18               Expected Discharge Plan:  [place]  In-House Referral:  NA  Discharge planning Services  CM Consult  Post Acute Care Choice:  Durable Medical Equipment, Home Health Choice offered to:  Patient  DME Arranged:  Walker rolling DME Agency:  [place]:  PT [place] Agency:  [facility] (formerly [facility])  Status of Service:  Completed, signed off  If discussed at [employer] of Stay Meetings, dates discussed:    Additional Comments:  Ninfa Meeker, RN 08/03/2018, 10:17 AM

## 2018-08-04 LAB — GLUCOSE, CAPILLARY
Glucose-Capillary: 199 mg/dL — ABNORMAL HIGH (ref 70–99)
Glucose-Capillary: 219 mg/dL — ABNORMAL HIGH (ref 70–99)
Glucose-Capillary: 161 mg/dL — ABNORMAL HIGH (ref 70–99)
Glucose-Capillary: 186 mg/dL — ABNORMAL HIGH (ref 70–99)
Glucose-Capillary: 157 mg/dL — ABNORMAL HIGH (ref 70–99)

## 2018-08-04 MED ORDER — PNEUMOCOCCAL VAC POLYVALENT 25 MCG/0.5ML IJ INJ
0.5000 mL | INJECTION | INTRAMUSCULAR | Status: AC
  Administered 2018-08-05: 0.5 mL via INTRAMUSCULAR
  Filled 2018-08-04: qty 0.5

## 2018-08-04 MED ORDER — SEMAGLUTIDE(0.25 OR 0.5MG/DOS) 2 MG/1.5ML ~~LOC~~ SOPN
1.0000 mg | PEN_INJECTOR | SUBCUTANEOUS | Status: DC
  Filled 2018-08-04: qty 1

## 2018-08-04 NOTE — Progress Notes (Signed)
Subjective: 2 Days Post-Op Procedure(s) (LRB): LEFT TOTAL KNEE ARTHROPLASTY (Left) Patient reports pain as moderate.  No lightheadedness/dizziness, chest pain/sob.  Feeling well this am.   Objective: Vital signs in last 24 hours: Temp:  [97.5 F (36.4 C)-98.1 F (36.7 C)] 97.5 F (36.4 C) (10/23 0538) Pulse Rate:  [97-118] 97 (10/23 0538) Resp:  [20] 20 (10/23 0538) BP: (133-146)/(86-88) 133/86 (10/23 0538) SpO2:  [94 %] 94 % (10/23 0538)  Intake/Output from previous day: No intake/output data recorded. Intake/Output this shift: No intake/output data recorded.  Recent Labs    08/03/18 0600  HGB 12.0   Recent Labs    08/03/18 0600  WBC 6.9  RBC 4.17  HCT 39.1  PLT 153   Recent Labs    08/03/18 0600  NA 139  K 4.2  CL 108  CO2 25  BUN 16  CREATININE 0.95  GLUCOSE 168*  CALCIUM 9.0   No results for input(s): LABPT, INR in the last 72 hours.  Neurologically intact Neurovascular intact Sensation intact distally Intact pulses distally Dorsiflexion/Plantar flexion intact Incision: dressing C/D/I No cellulitis present Compartment soft  Anticipated LOS equal to or greater than 2 midnights due to - Age 66 and older with one or more of the following:  - Obesity  - Expected need for hospital services (PT, OT, Nursing) required for safe  discharge  - Anticipated need for postoperative skilled nursing care or inpatient rehab  - Active co-morbidities: Diabetes OR   - Unanticipated findings during/Post Surgery: Slow post-op progression: GI, pain control, mobility  - Patient is a high risk of re-admission due to: None   Assessment/Plan: 2 Days Post-Op Procedure(s) (LRB): LEFT TOTAL KNEE ARTHROPLASTY (Left) Advance diet Up with therapy D/C IV fluids Plan for discharge tomorrow to home with hhpt WBAT LLE PLEASE APPLY THIGH HIGH TED HOSE TO RLE    Aundra Dubin 08/04/2018, 7:12 AM

## 2018-08-04 NOTE — Progress Notes (Signed)
Physical Therapy Treatment Patient Details Name: Abigail Collins MRN: [id number] DOB: 1951/02/29 Today's Date: 08/04/2018    History of Present Illness Pt is 67 y/o female s/p left total knee arthroplasty (08/03/18) secondary to L knee osteoarthritis. PMH of right humeral ORIF (12/2017) limiting WB tolerance through R UE, and lumbar two kyphoplasty (12/2012), dyspnea, and asthma.      PT Comments    Pt making progress towards her goals, however continues to be limited in safe mobility by pain, and decreased strength and endurance. Pt is currently min guard for bed mobility and minA for transfers and ambulation of 150 feet with RW. Pt reluctant to increase weightbearing in her L LE secondary to pain, which requires increased UE support with ambulation. Will continue to work on ambulation and stair training this afternoon.   Follow Up Recommendations  Supervision/Assistance - 24 hour;Follow surgeon's recommendation for DC plan and follow-up therapies     Equipment Recommendations  Rolling walker with 5" wheels;Other (comment)(tub bench)       Precautions / Restrictions Precautions Precautions: Knee Precaution Booklet Issued: Yes (comment) Precaution Comments: TKA handout Restrictions Weight Bearing Restrictions: (P) Yes LLE Weight Bearing: (P) Weight bearing as tolerated    Mobility  Bed Mobility Overal bed mobility: Needs Assistance Bed Mobility: Sit to Supine       Sit to supine: Min guard   General bed mobility comments: min guard for safety coming to EoB  Transfers Overall transfer level: Needs assistance Equipment used: Rolling walker (2 wheeled) Transfers: Sit to/from Stand Sit to Stand: Min assist         General transfer comment: min A for steadying with RW, good power up   Ambulation/Gait Ambulation/Gait assistance: Min assist Gait Distance (Feet): 150 Feet Assistive device: Rolling walker (2 wheeled) Gait Pattern/deviations: Step-to pattern;Decreased  step length - right;Decreased stance time - left;Antalgic;Trunk flexed;Decreased weight shift to left Gait velocity: slowed Gait velocity interpretation: <1.8 ft/sec, indicate of risk for recurrent falls General Gait Details: min A for stability with RW, vc for increased weightbearing through L LE and decreased UE support to insure swing through of R LE as she slides it along to floor to advance       Balance Overall balance assessment: Needs assistance Sitting-balance support: Feet supported;No upper extremity supported Sitting balance-Leahy Scale: Fair     Standing balance support: Single extremity supported;During functional activity Standing balance-Leahy Scale: Fair                              Cognition Arousal/Alertness: Awake/alert Behavior During Therapy: WFL for tasks assessed/performed Overall Cognitive Status: Within Functional Limits for tasks assessed                                        Exercises Total Joint Exercises Ankle Circles/Pumps: AROM;Both;10 reps Quad Sets: AROM;Left;5 reps;Seated Heel Slides: AROM;Left;5 reps;Seated Long Arc Quad: AROM;AAROM;Left;10 reps;Seated(limited ROM due to pain of movement) Knee Flexion: AROM;Left;10 reps;Seated    General Comments General comments (skin integrity, edema, etc.): Pt with c/o of pain and knee mobility stating that she has "done everything you told me". despite not getting out of bed for breakfast when she was told to eat all meals in the recliner. Instruction sheet posted in room to remind pt of mobility expectations      Pertinent Vitals/Pain Pain Assessment: 0-10  Pain Score: 7  Pain Location: knee operative site Pain Descriptors / Indicators: Grimacing;Guarding;Operative site guarding           PT Goals (current goals can now be found in the care plan section) Acute Rehab PT Goals Patient Stated Goal: to have less pain with movement  PT Goal Formulation: With patient Time  For Goal Achievement: 08/17/18 Potential to Achieve Goals: Good Progress towards PT goals: Progressing toward goals    Frequency    7X/week      PT Plan Current plan remains appropriate       AM-PAC PT "6 Clicks" Daily Activity  Outcome Measure  Difficulty turning over in bed (including adjusting bedclothes, sheets and blankets)?: Unable Difficulty moving from lying on back to sitting on the side of the bed? : Unable Difficulty sitting down on and standing up from a chair with arms (e.g., wheelchair, bedside commode, etc,.)?: Unable Help needed moving to and from a bed to chair (including a wheelchair)?: A Little Help needed walking in hospital room?: A Little Help needed climbing 3-5 steps with a railing? : A Lot 6 Click Score: 11    End of Session Equipment Utilized During Treatment: Gait belt Activity Tolerance: Patient limited by pain Patient left: in chair;with call bell/phone within reach;with chair alarm set Nurse Communication: Mobility status PT Visit Diagnosis: Unsteadiness on feet (R26.81);Other abnormalities of gait and mobility (R26.89);Muscle weakness (generalized) (M62.81);Difficulty in walking, not elsewhere classified (R26.2);Pain Pain - Right/Left: Left Pain - part of body: Knee     Time: 0916-0950 PT Time Calculation (min) (ACUTE ONLY): 34 min  Charges:  $Gait Training: 8-22 mins $Therapeutic Exercise: 8-22 mins                     Yenty Bloch B. Migdalia Dk PT, DPT Acute Rehabilitation Services Pager [phone] Office [phone]    [place] 08/04/2018, 10:55 AM

## 2018-08-04 NOTE — Plan of Care (Signed)
  Problem: Pain Managment: Goal: General experience of comfort will improve Outcome: Progressing   Problem: Safety: Goal: Ability to remain free from injury will improve Outcome: Progressing   

## 2018-08-04 NOTE — Progress Notes (Signed)
Physical Therapy Treatment Patient Details Name: Abigail Collins MRN: [id number] DOB: 15-Nov-1950 Today's Date: 08/04/2018    History of Present Illness Pt is 66 y/o female s/p left total knee arthroplasty (08/03/18) secondary to L knee osteoarthritis. PMH of right humeral ORIF (12/2017) limiting WB tolerance through R UE, and lumbar two kyphoplasty (12/2012), dyspnea, and asthma.      PT Comments    Patient seen for mobility progression. Pt tolerated session well and able to increase gait distance. Pt is making progress with weight bearing and able to ascend/descend steps simulating home entrance with min A. Continue to progress as tolerated.   Follow Up Recommendations  Supervision/Assistance - 24 hour;Follow surgeon's recommendation for DC plan and follow-up therapies     Equipment Recommendations  Rolling walker with 5" wheels;Other (comment)(tub bench)    Recommendations for Other Services       Precautions / Restrictions Precautions Precautions: Knee Precaution Comments: precautions/positioning reviewed with pt Restrictions Weight Bearing Restrictions: Yes LLE Weight Bearing: Weight bearing as tolerated    Mobility  Bed Mobility Overal bed mobility: Needs Assistance Bed Mobility: Sit to Supine;Supine to Sit     Supine to sit: Min guard Sit to supine: Min assist   General bed mobility comments: assist to bring L LE into bed  Transfers Overall transfer level: Needs assistance Equipment used: Rolling walker (2 wheeled) Transfers: Sit to/from Stand Sit to Stand: Min guard         General transfer comment: min guard for safety; increased time and effort needed to stand; pt with poor eccentric loading   Ambulation/Gait Ambulation/Gait assistance: Min assist Gait Distance (Feet): 120 Feet Assistive device: Rolling walker (2 wheeled) Gait Pattern/deviations: Decreased step length - right;Decreased stance time - left;Antalgic;Decreased weight shift to  left;Step-through pattern;Trunk flexed Gait velocity: slowed   General Gait Details: cues for posture, proximity to RW, step length symmetry, and increased L knee flexion during swing phase; pt is improving with step through pattern and weight bearing    Stairs Stairs: Yes Stairs assistance: Min assist Stair Management: One rail Left;Step to pattern;Sideways;Forwards;With cane Number of Stairs: (2 steps X2 with L rail and 2 steps with L rail and cane) General stair comments: cues for sequencing and technique; 2 steps X2 trials sideways with L rail and 2 steps with L rail and cane   Wheelchair Mobility    Modified Rankin (Stroke Patients Only)       Balance Overall balance assessment: Needs assistance Sitting-balance support: Feet supported;No upper extremity supported Sitting balance-Leahy Scale: Fair     Standing balance support: Single extremity supported;During functional activity Standing balance-Leahy Scale: Fair                              Cognition Arousal/Alertness: Awake/alert Behavior During Therapy: WFL for tasks assessed/performed Overall Cognitive Status: Within Functional Limits for tasks assessed                                        Exercises      General Comments        Pertinent Vitals/Pain Pain Assessment: Faces Faces Pain Scale: Hurts little more Pain Location: knee operative site Pain Descriptors / Indicators: Guarding;Sore Pain Intervention(s): Monitored during session;Repositioned    Home Living  Prior Function            PT Goals (current goals can now be found in the care plan section) Acute Rehab PT Goals PT Goal Formulation: With patient Time For Goal Achievement: 08/17/18 Potential to Achieve Goals: Good Progress towards PT goals: Progressing toward goals    Frequency    7X/week      PT Plan Current plan remains appropriate    Co-evaluation               AM-PAC PT "6 Clicks" Daily Activity  Outcome Measure  Difficulty turning over in bed (including adjusting bedclothes, sheets and blankets)?: Unable Difficulty moving from lying on back to sitting on the side of the bed? : Unable Difficulty sitting down on and standing up from a chair with arms (e.g., wheelchair, bedside commode, etc,.)?: Unable Help needed moving to and from a bed to chair (including a wheelchair)?: A Little Help needed walking in hospital room?: A Little Help needed climbing 3-5 steps with a railing? : A Little 6 Click Score: 12    End of Session Equipment Utilized During Treatment: Gait belt Activity Tolerance: Patient tolerated treatment well Patient left: with call bell/phone within reach;in bed Nurse Communication: Mobility status PT Visit Diagnosis: Unsteadiness on feet (R26.81);Other abnormalities of gait and mobility (R26.89);Muscle weakness (generalized) (M62.81);Difficulty in walking, not elsewhere classified (R26.2);Pain Pain - Right/Left: Left Pain - part of body: Knee     Time: 4081-4481 PT Time Calculation (min) (ACUTE ONLY): 48 min  Charges:  $Gait Training: 23-37 mins $Therapeutic Activity: 8-22 mins                     Abigail Collins, PTA Acute Rehabilitation Services Pager: [phone] Office: [phone]     Abigail Collins 08/04/2018, 4:28 PM

## 2018-08-04 NOTE — Progress Notes (Signed)
Inpatient Diabetes Program Recommendations  AACE/ADA: New Consensus Statement on Inpatient Glycemic Control (2015)  Target Ranges:  Prepandial:   less than 140 mg/dL      Peak postprandial:   less than 180 mg/dL (1-2 hours)      Critically ill patients:  140 - 180 mg/dL   Lab Results  Component Value Date   GLUCAP 161 (H) 08/04/2018   HGBA1C 6.7 (H) 07/26/2018    Results for [name], [name] (MRN [id number]) as of 08/04/2018 14:40  Ref. Range 08/03/2018 16:17 08/03/2018 21:22 08/04/2018 06:12 08/04/2018 08:13 08/04/2018 11:26  Glucose-Capillary Latest Ref Range: 70 - 99 mg/dL 251 (H) 235 (H) 199 (H) 219 (H) 161 (H)    DM2  Home DM meds: Toujeo 50 units if CBG>170                              Glucophage 1000mg  BID                              Ozempic 1 mg Q week  Current inpatient DM meds: Novolog resistant scale (0-20 units) tid and (0-5 units) hs                                              Ozempic 1 mg / week (not on hospital formulary)   CBG still elevated.  MD please consider the following inpatient insulin adjustments.   1. Lantus 25 units (this is 1/2 of home basal dose)  2. Decrease to MODERATE correction scale of Novolog (0-15 units) tid and (0-5 units) hs   Thank you.  -- Will follow during hospitalization.--  Jonna Clark RN, MSN Diabetes Coordinator Inpatient Glycemic Control Team Team Pager: [phone] (8am-5pm)

## 2018-08-05 ENCOUNTER — Other Ambulatory Visit (INDEPENDENT_AMBULATORY_CARE_PROVIDER_SITE_OTHER): Payer: Self-pay | Admitting: Physician Assistant

## 2018-08-05 LAB — GLUCOSE, CAPILLARY
Glucose-Capillary: 176 mg/dL — ABNORMAL HIGH (ref 70–99)
Glucose-Capillary: 158 mg/dL — ABNORMAL HIGH (ref 70–99)
Glucose-Capillary: 139 mg/dL — ABNORMAL HIGH (ref 70–99)
Glucose-Capillary: 150 mg/dL — ABNORMAL HIGH (ref 70–99)

## 2018-08-05 MED ORDER — FLUCONAZOLE 100 MG PO TABS: ORAL_TABLET | ORAL | 0 refills | Status: DC

## 2018-08-05 NOTE — Progress Notes (Signed)
Physical Therapy Treatment Patient Details Name: Abigail Collins MRN: [id number] DOB: Nov 18, 1950 Today's Date: 08/05/2018    History of Present Illness Pt is 67 y/o female s/p left total knee arthroplasty (08/03/18) secondary to L knee osteoarthritis. PMH of right humeral ORIF (12/2017) limiting WB tolerance through R UE, and lumbar two kyphoplasty (12/2012), dyspnea, and asthma.      PT Comments    Patient seen for mobility progression. Pt is making gradual progress toward PT goals. Pt requires min guard/min A for functional transfers and gait/stair training. Pt continues to rely heavily on RW and is mildly unsteady at times when ambulating. Pt c/o L knee/hip and back pain this session. Continue to progress as tolerated.    Follow Up Recommendations  Supervision/Assistance - 24 hour;Follow surgeon's recommendation for DC plan and follow-up therapies     Equipment Recommendations  Rolling walker with 5" wheels;Other (comment);3in1 (PT)(tub bench)    Recommendations for Other Services       Precautions / Restrictions Precautions Precautions: Knee Precaution Comments: precautions/positioning reviewed with pt Restrictions Weight Bearing Restrictions: Yes LLE Weight Bearing: Weight bearing as tolerated    Mobility  Bed Mobility               General bed mobility comments: pt OOB in chair upon arrival  Transfers Overall transfer level: Needs assistance Equipment used: Rolling walker (2 wheeled) Transfers: Sit to/from Stand Sit to Stand: Min guard         General transfer comment: pt stood from recliner and BSC with min guard for safety; safe hand placement demonstrated  Ambulation/Gait Ambulation/Gait assistance: Min assist;Min guard Gait Distance (Feet): 120 Feet Assistive device: Rolling walker (2 wheeled) Gait Pattern/deviations: Decreased step length - right;Decreased stance time - left;Antalgic;Decreased weight shift to left;Step-through pattern;Trunk  flexed Gait velocity: slowed   General Gait Details: cues for increased R step length/height, posture, and safe use of AD; pt is a little unsteady and with heavy reliance on RW   Stairs   Stairs assistance: Min assist;Min guard Stair Management: One rail Left;Step to pattern;Sideways;Forwards;With walker Number of Stairs: 4 General stair comments: cues for sequencing and technique; practiced with L hand rail until top of steps and then used RW to step up last step to simulate home entrance   Wheelchair Mobility    Modified Rankin (Stroke Patients Only)       Balance Overall balance assessment: Needs assistance Sitting-balance support: Feet supported;No upper extremity supported Sitting balance-Leahy Scale: Fair     Standing balance support: During functional activity;Bilateral upper extremity supported Standing balance-Leahy Scale: Poor                              Cognition Arousal/Alertness: Awake/alert Behavior During Therapy: WFL for tasks assessed/performed Overall Cognitive Status: Within Functional Limits for tasks assessed                                        Exercises      General Comments        Pertinent Vitals/Pain Pain Assessment: Faces Faces Pain Scale: Hurts little more Pain Location: knee operative site, L hip, and back Pain Descriptors / Indicators: Guarding;Sore Pain Intervention(s): Limited activity within patient's tolerance;Monitored during session;Repositioned    Home Living  Prior Function            PT Goals (current goals can now be found in the care plan section) Progress towards PT goals: Progressing toward goals    Frequency    7X/week      PT Plan Current plan remains appropriate    Co-evaluation              AM-PAC PT "6 Clicks" Daily Activity  Outcome Measure  Difficulty turning over in bed (including adjusting bedclothes, sheets and blankets)?:  Unable Difficulty moving from lying on back to sitting on the side of the bed? : Unable Difficulty sitting down on and standing up from a chair with arms (e.g., wheelchair, bedside commode, etc,.)?: Unable Help needed moving to and from a bed to chair (including a wheelchair)?: A Little Help needed walking in hospital room?: A Little Help needed climbing 3-5 steps with a railing? : A Little 6 Click Score: 12    End of Session Equipment Utilized During Treatment: Gait belt Activity Tolerance: Patient tolerated treatment well Patient left: with call bell/phone within reach;in chair Nurse Communication: Mobility status PT Visit Diagnosis: Unsteadiness on feet (R26.81);Other abnormalities of gait and mobility (R26.89);Muscle weakness (generalized) (M62.81);Difficulty in walking, not elsewhere classified (R26.2);Pain Pain - Right/Left: Left Pain - part of body: Knee     Time: 2725-3664 PT Time Calculation (min) (ACUTE ONLY): 28 min  Charges:  $Gait Training: 8-22 mins $Therapeutic Activity: 8-22 mins                     Abigail Collins, PTA Acute Rehabilitation Services Pager: [phone] Office: [phone]     Abigail Collins 08/05/2018, 9:41 AM

## 2018-08-05 NOTE — Plan of Care (Signed)

## 2018-08-05 NOTE — Progress Notes (Signed)
Subjective: 3 Days Post-Op Procedure(s) (LRB): LEFT TOTAL KNEE ARTHROPLASTY (Left) Patient reports pain as moderate.  Doing ok otherwise  Objective: Vital signs in last 24 hours: Temp:  [97.6 F (36.4 C)] 97.6 F (36.4 C) (10/23 2124) Pulse Rate:  [85] 85 (10/23 2124) Resp:  [17] 17 (10/23 2124) BP: (124)/(79) 124/79 (10/23 2124) SpO2:  [95 %] 95 % (10/23 2124)  Intake/Output from previous day: 10/23 0701 - 10/24 0700 In: 3118.3 [P.O.:720; I.V.:2398.3] Out: 1100 [Urine:1100] Intake/Output this shift: No intake/output data recorded.  Recent Labs    08/03/18 0600  HGB 12.0   Recent Labs    08/03/18 0600  WBC 6.9  RBC 4.17  HCT 39.1  PLT 153   Recent Labs    08/03/18 0600  NA 139  K 4.2  CL 108  CO2 25  BUN 16  CREATININE 0.95  GLUCOSE 168*  CALCIUM 9.0   No results for input(s): LABPT, INR in the last 72 hours.  Neurologically intact Neurovascular intact Sensation intact distally Intact pulses distally Dorsiflexion/Plantar flexion intact Incision: dressing C/D/I No cellulitis present Compartment soft  Anticipated LOS equal to or greater than 2 midnights due to - Age 30 and older with one or more of the following:  - Obesity  - Expected need for hospital services (PT, OT, Nursing) required for safe  discharge  - Anticipated need for postoperative skilled nursing care or inpatient rehab  - Active co-morbidities: Diabetes OR   - Unanticipated findings during/Post Surgery: Slow post-op progression: GI, pain control, mobility  - Patient is a high risk of re-admission due to: None   Assessment/Plan: 3 Days Post-Op Procedure(s) (LRB): LEFT TOTAL KNEE ARTHROPLASTY (Left) Advance diet Up with therapy D/C IV fluids Discharge home with home health as long as she mobilizes well, progresses with stair training and feels comfortable leaving.   WBAT LLE Patient notes that she WILL need 3-in-1 chair when she goes home.  The night nurse will pass this info  to day nurse     Aundra Dubin 08/05/2018, 7:50 AM

## 2018-08-05 NOTE — Care Management Important Message (Signed)
Important Message  Patient Details  Name: Abigail Collins MRN: [id number] Date of Birth: 01/23/1951   Medicare Important Message Given:  Yes    Arva Slaugh 08/05/2018, 11:56 AM

## 2018-08-05 NOTE — Progress Notes (Signed)
Physical Therapy Treatment Patient Details Name: Abigail Collins MRN: [id number] DOB: 10-01-1951 Today's Date: 08/05/2018    History of Present Illness Pt is 67 y/o female s/p left total knee arthroplasty (08/03/18) secondary to L knee osteoarthritis. PMH of right humeral ORIF (12/2017) limiting WB tolerance through R UE, and lumbar two kyphoplasty (12/2012), dyspnea, and asthma.      PT Comments    Patient seen for mobility progression. This session focused on LE therex and HEP handout review. Continue to progress as tolerated.    Follow Up Recommendations  Supervision/Assistance - 24 hour;Follow surgeon's recommendation for DC plan and follow-up therapies     Equipment Recommendations  Rolling walker with 5" wheels;Other (comment);3in1 (PT)(tub bench)    Recommendations for Other Services       Precautions / Restrictions Precautions Precautions: Knee Precaution Comments: precautions/positioning reviewed with pt Restrictions Weight Bearing Restrictions: Yes LLE Weight Bearing: Weight bearing as tolerated    Mobility  Bed Mobility Overal bed mobility: Needs Assistance Bed Mobility: Sit to Supine;Supine to Sit     Supine to sit: Min guard Sit to supine: Min assist   General bed mobility comments: pt OOB in chair upon arrival  Transfers Overall transfer level: Needs assistance Equipment used: Rolling walker (2 wheeled) Transfers: Sit to/from Stand Sit to Stand: Min guard         General transfer comment: from EOB and BSC  Ambulation/Gait Ambulation/Gait assistance: Min guard Gait Distance (Feet): 20 Feet Assistive device: Rolling walker (2 wheeled) Gait Pattern/deviations: Decreased step length - right;Decreased stance time - left;Antalgic;Decreased weight shift to left;Step-through pattern;Trunk flexed     General Gait Details: cues for posture and increased R step length/height   Stairs             Wheelchair Mobility    Modified Rankin  (Stroke Patients Only)       Balance Overall balance assessment: Needs assistance Sitting-balance support: Feet supported;No upper extremity supported Sitting balance-Leahy Scale: Fair     Standing balance support: During functional activity;Bilateral upper extremity supported Standing balance-Leahy Scale: Poor                              Cognition Arousal/Alertness: Awake/alert Behavior During Therapy: WFL for tasks assessed/performed Overall Cognitive Status: Within Functional Limits for tasks assessed                                        Exercises Total Joint Exercises Ankle Circles/Pumps: AROM;Both;10 reps Quad Sets: AROM;Both Short Arc Quad: AAROM;Left;10 reps Heel Slides: Left;AAROM;10 reps Hip ABduction/ADduction: AROM;Left;10 reps Straight Leg Raises: AAROM;Left;10 reps Long Arc Quad: Left;10 reps;Seated;AROM Knee Flexion: AROM;Left;10 reps;Seated(10 second holds)    General Comments        Pertinent Vitals/Pain Pain Assessment: Faces Faces Pain Scale: Hurts little more Pain Location: knee operative site, L hip, and back Pain Descriptors / Indicators: Guarding;Sore Pain Intervention(s): Monitored during session;Premedicated before session;Repositioned    Home Living                      Prior Function            PT Goals (current goals can now be found in the care plan section) Progress towards PT goals: Progressing toward goals    Frequency    7X/week  PT Plan Current plan remains appropriate    Co-evaluation              AM-PAC PT "6 Clicks" Daily Activity  Outcome Measure  Difficulty turning over in bed (including adjusting bedclothes, sheets and blankets)?: Unable Difficulty moving from lying on back to sitting on the side of the bed? : Unable Difficulty sitting down on and standing up from a chair with arms (e.g., wheelchair, bedside commode, etc,.)?: Unable Help needed moving to and  from a bed to chair (including a wheelchair)?: A Little Help needed walking in hospital room?: A Little Help needed climbing 3-5 steps with a railing? : A Little 6 Click Score: 12    End of Session Equipment Utilized During Treatment: Gait belt Activity Tolerance: Patient tolerated treatment well Patient left: with call bell/phone within reach;in bed Nurse Communication: Mobility status PT Visit Diagnosis: Unsteadiness on feet (R26.81);Other abnormalities of gait and mobility (R26.89);Muscle weakness (generalized) (M62.81);Difficulty in walking, not elsewhere classified (R26.2);Pain Pain - Right/Left: Left Pain - part of body: Knee     Time: 3295-1884 PT Time Calculation (min) (ACUTE ONLY): 34 min  Charges:  $Therapeutic Exercise: 23-37 mins                     Abigail Collins, PTA Acute Rehabilitation Services Pager: [phone] Office: [phone]     Abigail Collins 08/05/2018, 5:05 PM

## 2018-08-05 NOTE — Discharge Summary (Addendum)
Patient ID: Abigail Collins MRN: [id number] DOB/AGE: 1951/06/19 67 y.o.  Admit date: 08/02/2018 Discharge date: 08/06/2018  Admission Diagnoses:  Principal Problem:   Primary osteoarthritis of left knee Active Problems:   Total knee replacement status   Discharge Diagnoses:  Same  Past Medical History:  Diagnosis Date  . Anemia    hx of  . Anginal pain ([place])    "left arm pain, sees Dr. Etter Sjogren, had card cath 2013"  . Arthritis    "all over" (09/14/2012)  . Asthma    takes inhaler 2x day  . Bronchitis    hx of  . Bulging disc    "lower"  . Carpal tunnel syndrome of left wrist   . Depression    denies  . Exertional dyspnea    "sometimes laying down" (09/14/2012)  . Headache    frequent headaches,usually if she does not eat  . Hyperlipidemia   . Hypertension    sees Dr. Debby Freiberg, primary  . Pneumonia 03/2012  . PONV (postoperative nausea and vomiting)   . Thyroid disease 1960's   "don't have it now" (09/14/2012)  . Type II diabetes mellitus ([place])   . Urinary tract infection    hx of  . Vomiting    pt states she vomits every am    Surgeries: Procedure(s): LEFT TOTAL KNEE ARTHROPLASTY on 08/02/2018   Consultants:   Discharged Condition: Improved  Hospital Course: Abigail Collins is an 67 y.o. female who was admitted 08/02/2018 for operative treatment ofPrimary osteoarthritis of left knee. Patient has severe unremitting pain that affects sleep, daily activities, and work/hobbies. After pre-op clearance the patient was taken to the operating room on 08/02/2018 and underwent  Procedure(s): LEFT TOTAL KNEE ARTHROPLASTY.    Patient was given perioperative antibiotics:  Anti-infectives (From admission, onward)   Start     Dose/Rate Route Frequency Ordered Stop   08/06/18 0000  fluconazole (DIFLUCAN) 150 MG tablet     150 mg Oral  Once 08/06/18 0748 08/06/18 2359   08/02/18 1400  ceFAZolin (ANCEF) IVPB 2g/100 mL premix     2 g 200 mL/hr over 30 Minutes  Intravenous Every 6 hours 08/02/18 1223 08/03/18 0221   08/02/18 0825  vancomycin (VANCOCIN) powder  Status:  Discontinued       As needed 08/02/18 0826 08/02/18 0950   08/02/18 0600  ceFAZolin (ANCEF) IVPB 2g/100 mL premix     2 g 200 mL/hr over 30 Minutes Intravenous On call to O.R. 08/02/18 0543 08/02/18 0742   08/02/18 0000  sulfamethoxazole-trimethoprim (BACTRIM DS,SEPTRA DS) 800-160 MG tablet     1 tablet Oral 2 times daily 08/02/18 4098         Patient was given sequential compression devices, early ambulation, and chemoprophylaxis to prevent DVT.  Patient benefited maximally from hospital stay and there were no complications.    Recent vital signs:  Patient Vitals for the past 24 hrs:  BP Temp Temp src Pulse Resp SpO2  08/05/18 2100 115/65 98.2 F (36.8 C) Oral 81 16 95 %  08/05/18 1357 94/68 98.2 F (36.8 C) Oral 97 16 94 %     Recent laboratory studies:  No results for input(s): WBC, HGB, HCT, PLT, NA, K, CL, CO2, BUN, CREATININE, GLUCOSE, INR, CALCIUM in the last 72 hours.  Invalid input(s): PT, 2   Discharge Medications:   Allergies as of 08/06/2018      Reactions   Lisinopril Shortness Of Breath   Amlodipine Swelling   UNSPECIFIED  EDEMA   Tape Other (See Comments)   Tore skin--?hypofix   Morphine And Related Nausea And Vomiting   Percocet [oxycodone-acetaminophen] Nausea And Vomiting      Medication List    TAKE these medications   albuterol (2.5 MG/3ML) 0.083% nebulizer solution Commonly known as:  PROVENTIL Inhale 2.5 mg into the lungs every 6 (six) hours as needed for wheezing or shortness of breath. Reported on 01/17/2016   Armodafinil 150 MG tablet Take 1 tablet (150 mg total) by mouth daily.   aspirin EC 81 MG tablet Take 1 tablet (81 mg total) by mouth 2 (two) times daily.   atorvastatin 20 MG tablet Commonly known as:  LIPITOR Take 1 tablet (20 mg total) by mouth daily.   Cholecalciferol 50000 units capsule Take 1 capsule (50,000 Units  total) by mouth once a week. What changed:  when to take this   fluconazole 100 MG tablet Commonly known as:  DIFLUCAN Take one tablet once for yeast infection   fluconazole 150 MG tablet Commonly known as:  DIFLUCAN Take 1 tablet (150 mg total) by mouth once for 1 dose.   HYDROcodone-acetaminophen 7.5-325 MG tablet Commonly known as:  NORCO Take 1-2 tablets by mouth every 6 (six) hours as needed for moderate pain.   Insulin Pen Needle 32G X 6 MM Misc 1 Act by Does not apply route daily.   irbesartan 300 MG tablet Commonly known as:  AVAPRO TAKE 1 TABLET BY MOUTH ONCE DAILY   metFORMIN 1000 MG tablet Commonly known as:  GLUCOPHAGE TAKE 1 TABLET BY MOUTH TWICE A DAY WITH FOOD What changed:  when to take this   methocarbamol 750 MG tablet Commonly known as:  ROBAXIN Take 1 tablet (750 mg total) by mouth 2 (two) times daily as needed for muscle spasms.   ondansetron 4 MG tablet Commonly known as:  ZOFRAN Take 1-2 tablets (4-8 mg total) by mouth every 8 (eight) hours as needed for nausea or vomiting.   ONETOUCH VERIO IQ SYSTEM w/Device Kit 1 Act by Does not apply route 3 (three) times daily.   ONETOUCH VERIO test strip Generic drug:  glucose blood TEST THREE TIMES A DAY   promethazine 25 MG tablet Commonly known as:  PHENERGAN Take 1 tablet (25 mg total) by mouth every 6 (six) hours as needed for nausea.   Semaglutide(0.25 or 0.5MG/DOS) 2 MG/1.5ML Sopn Inject 1 mg into the skin once a week. What changed:    when to take this  additional instructions   senna-docusate 8.6-50 MG tablet Commonly known as:  Senokot-S Take 1 tablet by mouth at bedtime as needed.   sodium-potassium bicarbonate Tbef dissolvable tablet Commonly known as:  ALKA-SELTZER GOLD Take 1 tablet by mouth at bedtime.   sulfamethoxazole-trimethoprim 800-160 MG tablet Commonly known as:  BACTRIM DS,SEPTRA DS Take 1 tablet by mouth 2 (two) times daily.   TOUJEO MAX SOLOSTAR 300 UNIT/ML  Sopn Generic drug:  Insulin Glargine Inject 50 Units into the skin at bedtime as needed (blood sugars more than 170).            Durable Medical Equipment  (From admission, onward)         Start     Ordered   08/06/18 0745  For home use only DME 3 n 1  Once     08/06/18 0744   08/02/18 1224  DME Walker rolling  Once    Question:  Patient needs a walker to treat with the following condition  Answer:  Total knee replacement status   08/02/18 1223   08/02/18 1224  DME 3 n 1  Once     08/02/18 1223   08/02/18 1224  DME Bedside commode  Once    Question:  Patient needs a bedside commode to treat with the following condition  Answer:  Total knee replacement status   08/02/18 1223          Diagnostic Studies: Dg Chest 2 View  Result Date: 07/27/2018 CLINICAL DATA:  Pre-op respiratory exam for knee joint replacement. Right knee osteoarthritis. EXAM: CHEST - 2 VIEW COMPARISON:  06/19/2016 FINDINGS: The heart size and mediastinal contours are within normal limits. Aortic atherosclerosis. Both lungs are clear. Fixation plate and screws are seen in the right humerus and previous upper lumbar vertebroplasty is also noted. IMPRESSION: No active cardiopulmonary disease. Electronically Signed   By: Earle Gell M.D.   On: 07/27/2018 07:03   Dg Knee Left Port  Result Date: 08/02/2018 CLINICAL DATA:  Postop left total knee arthroplasty. EXAM: PORTABLE LEFT KNEE - 1-2 VIEW COMPARISON:  Preoperative radiographs 10/29/2016 and 05/26/2018. FINDINGS: Status post interval left total knee arthroplasty. The hardware is well positioned. There is gas within the joint and anterior soft tissues. The distal femur and proximal patella are intact. There is linear lucency projecting over the proximal fibula on the lateral view which could reflect a vascular channel or overlying soft tissue finding, although a nondisplaced fracture is difficult to exclude. IMPRESSION: Interval left total knee arthroplasty.  Possible nondisplaced fracture of the fibular neck. Electronically Signed   By: Richardean Sale M.D.   On: 08/02/2018 11:13    Disposition: Discharge disposition: 01-Home or Self Care       Discharge Instructions    Call MD / Call 911   Complete by:  As directed    If you experience chest pain or shortness of breath, CALL 911 and be transported to the hospital emergency room.  If you develope a fever above 101.5 F, pus (white drainage) or increased drainage or redness at the wound, or calf pain, call your surgeon's office.   Constipation Prevention   Complete by:  As directed    Drink plenty of fluids.  Prune juice may be helpful.  You may use a stool softener, such as Colace (over the counter) 100 mg twice a day.  Use MiraLax (over the counter) for constipation as needed.   Driving restrictions   Complete by:  As directed    No driving while taking narcotic pain meds.   Increase activity slowly as tolerated   Complete by:  As directed       Follow-up Information    Leandrew Koyanagi, MD In 2 weeks.   Specialty:  Orthopedic Surgery Why:  For suture removal, For wound re-check Contact information: [place] [phone]        [facility] Follow up.   Specialty:  [place] Why:  A representative from [facility] will contact you to arrange start date and time for your therapy. Contact information: [address] [place] [address] [phone]            Signed: Eduard Roux 08/06/2018, 7:48 AM

## 2018-08-06 LAB — GLUCOSE, CAPILLARY
Glucose-Capillary: 142 mg/dL — ABNORMAL HIGH (ref 70–99)
Glucose-Capillary: 150 mg/dL — ABNORMAL HIGH (ref 70–99)

## 2018-08-06 MED ORDER — FLUCONAZOLE 150 MG PO TABS: 150.0000 mg | ORAL_TABLET | Freq: Once | ORAL | 3 refills | Status: AC

## 2018-08-06 NOTE — Progress Notes (Signed)
Reviewed AVS discharge instructions with patient/caregiver. Patient/caregiver verbalizes understanding of instructions received. AVS and prescriptions received by patient/caregiver. If present, telemetry box removed and central cardiac monitoring department notified of discharge. Peripheral IV removed, site benign with tip intact. Patient awaiting DME delivery and awaiting husband to arrive to transport home.

## 2018-08-06 NOTE — Progress Notes (Signed)
Retrieved home med from inpatient pharmacy and returned to patient. Patient received home med and signed appropriate paper work.

## 2018-08-06 NOTE — Progress Notes (Signed)
Physical Therapy Treatment Patient Details Name: Abigail Collins MRN: [id number] DOB: 11-Jun-1951 Today's Date: 08/06/2018    History of Present Illness Pt is 67 y/o female s/p left total knee arthroplasty (08/03/18) secondary to L knee osteoarthritis. PMH of right humeral ORIF (12/2017) limiting WB tolerance through R UE, and lumbar two kyphoplasty (12/2012), dyspnea, and asthma.      PT Comments    Patient continues to make progress toward PT goals. Pt demonstrates improved gait mechanics and L LE weight bearing this session.  Current plan remains appropriate.   Follow Up Recommendations  Supervision/Assistance - 24 hour;Follow surgeon's recommendation for DC plan and follow-up therapies     Equipment Recommendations  Rolling walker with 5" wheels;Other (comment);3in1 (PT)(tub bench)    Recommendations for Other Services       Precautions / Restrictions Precautions Precautions: Knee Precaution Comments: precautions/positioning reviewed with pt Restrictions Weight Bearing Restrictions: Yes LLE Weight Bearing: Weight bearing as tolerated    Mobility  Bed Mobility Overal bed mobility: Needs Assistance Bed Mobility: Sit to Supine;Supine to Sit     Supine to sit: Min guard Sit to supine: Min assist   General bed mobility comments: assist to bring L LE into bed  Transfers Overall transfer level: Needs assistance Equipment used: Rolling walker (2 wheeled) Transfers: Sit to/from Stand Sit to Stand: Min guard         General transfer comment: min guard for safety; safe hand placement demonstrated  Ambulation/Gait Ambulation/Gait assistance: Min guard; 176ft   Assistive device: Rolling walker (2 wheeled) Gait Pattern/deviations: Decreased step length - right;Decreased stance time - left;Antalgic;Decreased weight shift to left;Step-through pattern;Trunk flexed Gait velocity: slowed   General Gait Details: improving step length symmetry and R foot clearance today;  cues for posture and proximity to [employer]         General stair comments: verbally reviewed stair training for entrance to home and to get onto step to get into high bed   Wheelchair Mobility    Modified Rankin (Stroke Patients Only)       Balance Overall balance assessment: Needs assistance Sitting-balance support: Feet supported;No upper extremity supported Sitting balance-Leahy Scale: Fair     Standing balance support: During functional activity;Bilateral upper extremity supported Standing balance-Leahy Scale: Poor                              Cognition Arousal/Alertness: Awake/alert Behavior During Therapy: WFL for tasks assessed/performed Overall Cognitive Status: Within Functional Limits for tasks assessed                                        Exercises      General Comments General comments (skin integrity, edema, etc.): ambulation schedule and HEP frequency reviewed with pt      Pertinent Vitals/Pain Pain Assessment: Faces Faces Pain Scale: Hurts little more Pain Location: knee operative site, L hip, and back Pain Descriptors / Indicators: Guarding;Sore Pain Intervention(s): Monitored during session;Premedicated before session;Repositioned    Home Living                      Prior Function            PT Goals (current goals can now be found in the care plan section) Progress towards PT goals: Progressing toward goals  Frequency    7X/week      PT Plan Current plan remains appropriate    Co-evaluation              AM-PAC PT "6 Clicks" Daily Activity  Outcome Measure  Difficulty turning over in bed (including adjusting bedclothes, sheets and blankets)?: A Lot Difficulty moving from lying on back to sitting on the side of the bed? : A Lot Difficulty sitting down on and standing up from a chair with arms (e.g., wheelchair, bedside commode, etc,.)?: Unable Help needed moving to and from a  bed to chair (including a wheelchair)?: A Little Help needed walking in hospital room?: A Little Help needed climbing 3-5 steps with a railing? : A Little 6 Click Score: 14    End of Session Equipment Utilized During Treatment: Gait belt Activity Tolerance: Patient tolerated treatment well Patient left: with call bell/phone within reach;in bed Nurse Communication: Mobility status PT Visit Diagnosis: Unsteadiness on feet (R26.81);Other abnormalities of gait and mobility (R26.89);Muscle weakness (generalized) (M62.81);Difficulty in walking, not elsewhere classified (R26.2);Pain Pain - Right/Left: Left Pain - part of body: Knee     Time: 9373-4287 PT Time Calculation (min) (ACUTE ONLY): 30 min  Charges:  $Gait Training: 8-22 mins $Therapeutic Activity: 8-22 mins                     Earney Navy, PTA Acute Rehabilitation Services Pager: [phone] Office: [phone]     Darliss Cheney 08/06/2018, 9:20 AM

## 2018-08-09 ENCOUNTER — Telehealth (INDEPENDENT_AMBULATORY_CARE_PROVIDER_SITE_OTHER): Payer: Self-pay | Admitting: Orthopaedic Surgery

## 2018-08-09 NOTE — Telephone Encounter (Signed)
Any kind of stool softeners can be done over-the-counter.  She should either take Senokot or Dulcolax.  She can even try suppository or sorbitol if needed.  Worse case scenario would be an enema.  But again all these can be done over-the-counter.

## 2018-08-09 NOTE — Telephone Encounter (Signed)
Called patient to advise  °

## 2018-08-09 NOTE — Telephone Encounter (Signed)
Can you send in something for patient please.

## 2018-08-09 NOTE — Telephone Encounter (Signed)
Patient left a message stating that she has not been able to have a BM since surgery and wants to know if Dr. Erlinda Hong or Mendel Ryder could send something in for her.  VK#[phone].  Thank you.

## 2018-08-10 ENCOUNTER — Telehealth (INDEPENDENT_AMBULATORY_CARE_PROVIDER_SITE_OTHER): Payer: Self-pay | Admitting: Orthopaedic Surgery

## 2018-08-10 DIAGNOSIS — K3184 Gastroparesis: Secondary | ICD-10-CM | POA: Diagnosis not present

## 2018-08-10 DIAGNOSIS — K219 Gastro-esophageal reflux disease without esophagitis: Secondary | ICD-10-CM | POA: Diagnosis not present

## 2018-08-10 DIAGNOSIS — Z96653 Presence of artificial knee joint, bilateral: Secondary | ICD-10-CM | POA: Diagnosis not present

## 2018-08-10 DIAGNOSIS — I10 Essential (primary) hypertension: Secondary | ICD-10-CM | POA: Diagnosis not present

## 2018-08-10 DIAGNOSIS — Z7984 Long term (current) use of oral hypoglycemic drugs: Secondary | ICD-10-CM | POA: Diagnosis not present

## 2018-08-10 DIAGNOSIS — G5602 Carpal tunnel syndrome, left upper limb: Secondary | ICD-10-CM | POA: Diagnosis not present

## 2018-08-10 DIAGNOSIS — Z6836 Body mass index (BMI) 36.0-36.9, adult: Secondary | ICD-10-CM | POA: Diagnosis not present

## 2018-08-10 DIAGNOSIS — Z8701 Personal history of pneumonia (recurrent): Secondary | ICD-10-CM | POA: Diagnosis not present

## 2018-08-10 DIAGNOSIS — Z87440 Personal history of urinary (tract) infections: Secondary | ICD-10-CM | POA: Diagnosis not present

## 2018-08-10 DIAGNOSIS — D649 Anemia, unspecified: Secondary | ICD-10-CM | POA: Diagnosis not present

## 2018-08-10 DIAGNOSIS — S42492D Other displaced fracture of lower end of left humerus, subsequent encounter for fracture with routine healing: Secondary | ICD-10-CM | POA: Diagnosis not present

## 2018-08-10 DIAGNOSIS — J45901 Unspecified asthma with (acute) exacerbation: Secondary | ICD-10-CM | POA: Diagnosis not present

## 2018-08-10 DIAGNOSIS — Z87891 Personal history of nicotine dependence: Secondary | ICD-10-CM | POA: Diagnosis not present

## 2018-08-10 DIAGNOSIS — F329 Major depressive disorder, single episode, unspecified: Secondary | ICD-10-CM | POA: Diagnosis not present

## 2018-08-10 DIAGNOSIS — Z471 Aftercare following joint replacement surgery: Secondary | ICD-10-CM | POA: Diagnosis not present

## 2018-08-10 DIAGNOSIS — Z7982 Long term (current) use of aspirin: Secondary | ICD-10-CM | POA: Diagnosis not present

## 2018-08-10 DIAGNOSIS — E1143 Type 2 diabetes mellitus with diabetic autonomic (poly)neuropathy: Secondary | ICD-10-CM | POA: Diagnosis not present

## 2018-08-10 DIAGNOSIS — Z9181 History of falling: Secondary | ICD-10-CM | POA: Diagnosis not present

## 2018-08-10 DIAGNOSIS — J449 Chronic obstructive pulmonary disease, unspecified: Secondary | ICD-10-CM | POA: Diagnosis not present

## 2018-08-10 DIAGNOSIS — M810 Age-related osteoporosis without current pathological fracture: Secondary | ICD-10-CM | POA: Diagnosis not present

## 2018-08-10 NOTE — Telephone Encounter (Signed)
Gennaro Africa with [facility] called requesting VO for PT for 4x a week for 1 week and 1x a week for 1 week.  Patient is also requesting a bedside commode and a lift chair.  She wanted Dr. Erlinda Hong to know that the patient is being non-compliant and keep a pillow under her knee.  CB#[phone].  Thank you.

## 2018-08-11 ENCOUNTER — Other Ambulatory Visit (INDEPENDENT_AMBULATORY_CARE_PROVIDER_SITE_OTHER): Payer: Self-pay | Admitting: Family

## 2018-08-11 ENCOUNTER — Telehealth (INDEPENDENT_AMBULATORY_CARE_PROVIDER_SITE_OTHER): Payer: Self-pay | Admitting: Orthopaedic Surgery

## 2018-08-11 ENCOUNTER — Other Ambulatory Visit: Payer: Self-pay

## 2018-08-11 MED ORDER — HYDROCODONE-ACETAMINOPHEN 7.5-325 MG PO TABS: 1.0000 | ORAL_TABLET | Freq: Four times a day (QID) | ORAL | 0 refills | Status: DC | PRN

## 2018-08-11 NOTE — Patient Outreach (Signed)
[facility]) Care Management  [place]  08/11/2018  Abigail Collins 09/16/1951 [id number]  67 year old female outreached by [place] services for a 30 day post discharge medication review.  PMHx includes, but not limited to, hypertension, COPD, GERD, gastroparesis, diabetes mellitus, osteoporosis and hyperlipidemia.  Successful telephone call attempt # 1 to Abigail Collins.  HIPAA identifiers verified.  Patient requests that I call her back next week because she is sleeping.  Plan:  I will make another outreach attempt to patient within 3-4 business days.  [name], PharmD Clinical Pharmacist [place] [phone]

## 2018-08-11 NOTE — Telephone Encounter (Signed)
Abigail Collins    Medication refill  Hydrocodone    Patient wanted to let Mendel Ryder know she is currently still constipated. Patient is currently taking colace and its not working her last bowel movement was the 21st of October.

## 2018-08-11 NOTE — Telephone Encounter (Signed)
Can you advise since Xu/Lindsey are out please.

## 2018-08-11 NOTE — Telephone Encounter (Signed)
See message below °

## 2018-08-11 NOTE — Telephone Encounter (Signed)
Make sure she is drinking 8 glasses of water daily, try to wean off pain meds as this makes it worse  Can try magnesium citrate or miralax - cap full a day

## 2018-08-11 NOTE — Telephone Encounter (Signed)
Called patient to advise she is aware. rx ready for pick up

## 2018-08-12 ENCOUNTER — Telehealth (INDEPENDENT_AMBULATORY_CARE_PROVIDER_SITE_OTHER): Payer: Self-pay

## 2018-08-12 NOTE — Telephone Encounter (Signed)
Called kate and advised on message.   Called patient no answer. Line sounds busy will try again later.

## 2018-08-12 NOTE — Telephone Encounter (Signed)
Ok thank you.  Will you call patient and stress importance of not having pillow under knee

## 2018-08-12 NOTE — Telephone Encounter (Signed)
Called patient back but she is with the therapist right now. I will call her back.

## 2018-08-12 NOTE — Telephone Encounter (Signed)
Patient was returning your call.  Please advise.  Thank You

## 2018-08-13 NOTE — Telephone Encounter (Signed)
Called patient to advise  °

## 2018-08-16 ENCOUNTER — Other Ambulatory Visit: Payer: Self-pay

## 2018-08-16 ENCOUNTER — Telehealth: Payer: Self-pay | Admitting: Internal Medicine

## 2018-08-16 ENCOUNTER — Ambulatory Visit: Payer: Self-pay

## 2018-08-16 NOTE — Telephone Encounter (Signed)
Copied from [place] [id number]. Topic: Quick Communication - See Telephone Encounter >> Aug 16, 2018  2:57 PM Bea Graff, NT wrote: CRM for notification. See Telephone encounter for: 08/16/18. Anderson Malta with [facility] calling to ask for a refill of Semaglutide (OZEMPIC) 0.25 or 0.5 MG/DOSE SOPN for this pt and to see if the pt is suppose to be taking the Cholecalciferol 50000 units capsule as the pt never had the medicine refilled after the first 12 capsules were gone? Please advise. CB#: [phone].

## 2018-08-16 NOTE — Patient Outreach (Signed)
[facility]) Care Management  [place]   08/16/2018  Abigail Collins Mar 24, 1951 [id number]  67 year old female outreached by [place] services for a 30 day post discharge medication review.  PMHx includes, but not limited to, hypertension, COPD, GERD, gastroparesis, diabetes mellitus, osteoporosis and hyperlipidemia.  Successful outreach attempt to Abigail Collins.  HIPAA identifiers verified.  Subjective: Abigail Collins reports that she has been in a lot of pain over the weekend because she is out of her pain medication.  She is s/p total knee replacement.  She states that her husband is going to pick up the prescription today and take it to their pharmacy to get it filled.  She reports that she is no longer constipated and continues to take docusate.  She reports that she checks her CBGs 3-5 times day, with values between 100-160 mg/dL.  [name] reports that her HgA1c has decreased due to weight loss and Ozempic.  She states that she usually receives samples of Ozempic and Toujeo from her PCP.  Objective:  HgA1c 6.7% 07/26/18 SCr 0.95 mg/dL 08/03/18 Total Cholesterol 179 mg/dL, direct LDL 81 mg/dL and HDL 58 mg/dL on 05/06/18  Current Medications: Current Outpatient Medications  Medication Sig Dispense Refill  . aspirin EC 81 MG tablet Take 1 tablet (81 mg total) by mouth 2 (two) times daily. 84 tablet 0  . Blood Glucose Monitoring Suppl (ONETOUCH VERIO IQ SYSTEM) W/DEVICE KIT 1 Act by Does not apply route 3 (three) times daily. 2 kit 0  . docusate sodium (COLACE) 100 MG capsule Take 100 mg by mouth daily.    . Insulin Glargine (TOUJEO MAX SOLOSTAR) 300 UNIT/ML SOPN Inject 50 Units into the skin at bedtime as needed (blood sugars more than 170).    . Insulin Pen Needle (NOVOFINE) 32G X 6 MM MISC 1 Act by Does not apply route daily. 100 each 3  . irbesartan (AVAPRO) 300 MG tablet TAKE 1 TABLET BY MOUTH ONCE DAILY (Patient taking differently: Take 300 mg by  mouth daily. ) 90 tablet 1  . metFORMIN (GLUCOPHAGE) 1000 MG tablet TAKE 1 TABLET BY MOUTH TWICE A DAY WITH FOOD (Patient taking differently: Take 1,000 mg by mouth 2 (two) times daily. ) 180 tablet 0  . methocarbamol (ROBAXIN) 750 MG tablet Take 1 tablet (750 mg total) by mouth 2 (two) times daily as needed for muscle spasms. 60 tablet 0  . ONETOUCH VERIO test strip TEST THREE TIMES A DAY 200 each 2  . Semaglutide (OZEMPIC) 0.25 or 0.5 MG/DOSE SOPN Inject 1 mg into the skin once a week. (Patient taking differently: Inject 0.5 mg into the skin every Wednesday. In the morning.) 1.5 mL 5  . sodium-potassium bicarbonate (ALKA-SELTZER GOLD) TBEF dissolvable tablet Take 1 tablet by mouth at bedtime.    Marland Kitchen albuterol (PROVENTIL) (2.5 MG/3ML) 0.083% nebulizer solution Inhale 2.5 mg into the lungs every 6 (six) hours as needed for wheezing or shortness of breath. Reported on 01/17/2016    . atorvastatin (LIPITOR) 20 MG tablet Take 1 tablet (20 mg total) by mouth daily. (Patient not taking: Reported on 08/16/2018) 90 tablet 1  . Cholecalciferol 50000 units capsule Take 1 capsule (50,000 Units total) by mouth once a week. (Patient not taking: Reported on 08/16/2018) 12 capsule 1  . ondansetron (ZOFRAN) 4 MG tablet Take 1-2 tablets (4-8 mg total) by mouth every 8 (eight) hours as needed for nausea or vomiting. (Patient not taking: Reported on 08/16/2018) 40 tablet 0  .  promethazine (PHENERGAN) 25 MG tablet Take 1 tablet (25 mg total) by mouth every 6 (six) hours as needed for nausea. (Patient not taking: Reported on 08/16/2018) 30 tablet 1  . senna-docusate (SENOKOT S) 8.6-50 MG tablet Take 1 tablet by mouth at bedtime as needed. (Patient not taking: Reported on 08/16/2018) 30 tablet 1   No current facility-administered medications for this visit.     Functional Status: In your present state of health, do you have any difficulty performing the following activities: 08/02/2018 07/26/2018  Hearing? Y Y  Comment left  ear impaired left ear impaired.   Vision? N N  Difficulty concentrating or making decisions? N N  Walking or climbing stairs? Y Y  Dressing or bathing? N N  Doing errands, shopping? Y Y  Comment - pt does not drive.   Some recent data might be hidden    Fall/Depression Screening: Fall Risk  05/09/2018 05/27/2017 03/29/2015  Falls in the past year? No No No   PHQ 2/9 Scores 05/09/2018 05/27/2017 03/29/2015 08/04/2013  PHQ - 2 Score 0 1 4 0  PHQ- 9 Score - - 12 -   ASSESSMENT: Date Discharged from Hospital:08/05/18 Date Medication Reconciliation Performed: 08/16/2018  New Medications at Discharge:  Hydrocodone/APAP -out  Methocarbamol  Ondansetron  Promethazine  Senna Docusate  Patient was recently discharged from hospital and all medications have been reviewed  Drugs sorted by system:  Cardiovascular: aspirin, atorvastatin, irbesartan  Pulmonary/Allergy: albuterol MDI  Gastrointestinal: docusate, ondansetron, promethazine, senna/docustate  Endocrine: insulin glargine, metformin, semaglutide  Pain: methocarbamol  Vitamins/Minerals: cholecalciferol  Medications to avoid in the elderly: Per the Beers List, methacarbamol may be poorly tolerated by older adults.  Muscle relaxants may have anticholinergic adverse effects, sedation, increased risk of fractures and effectiveness at dosages tolerated by older adults is questionable.  There is strong evidence to avoid use in the elderly   Per the Beers List, promethazine is highly anticholinergic and clearance is reduced with advanced age. Risk of confusion, dry mouth, constipation and other anticholinergic effects or toxicity may occur.  There is strong evidence to avoid use in the elderly.   Other issues noted:  Atorvastatin- patient reports noncomplinace.  Counseled her about the importance of statins for diabetic patients.  She verbalized understanding and said she would start taking her atorvastatin.  Vitamin D 50,000  units- Patient says she was only given 12 capsules and is unsure if she was to continue.  Toujeo- patient uses prn CBG >170, reports using 1-2 times per week.  Ozempic- patient reports she needs more samples.  Will notify PCP.  Plan: Route note to PCP and clarify if patient is to continue Vitamin D and see if Ozempic samples available.  [name], PharmD Clinical Pharmacist [place] [phone]

## 2018-08-17 ENCOUNTER — Encounter (INDEPENDENT_AMBULATORY_CARE_PROVIDER_SITE_OTHER): Payer: Self-pay | Admitting: Physician Assistant

## 2018-08-17 ENCOUNTER — Ambulatory Visit: Payer: Self-pay

## 2018-08-17 ENCOUNTER — Ambulatory Visit: Payer: PPO

## 2018-08-17 ENCOUNTER — Ambulatory Visit (INDEPENDENT_AMBULATORY_CARE_PROVIDER_SITE_OTHER): Payer: PPO

## 2018-08-17 ENCOUNTER — Ambulatory Visit (INDEPENDENT_AMBULATORY_CARE_PROVIDER_SITE_OTHER): Payer: PPO | Admitting: Physician Assistant

## 2018-08-17 ENCOUNTER — Other Ambulatory Visit: Payer: Self-pay | Admitting: Internal Medicine

## 2018-08-17 DIAGNOSIS — Z96652 Presence of left artificial knee joint: Secondary | ICD-10-CM

## 2018-08-17 DIAGNOSIS — IMO0002 Reserved for concepts with insufficient information to code with codable children: Secondary | ICD-10-CM

## 2018-08-17 DIAGNOSIS — E1165 Type 2 diabetes mellitus with hyperglycemia: Secondary | ICD-10-CM

## 2018-08-17 DIAGNOSIS — E1149 Type 2 diabetes mellitus with other diabetic neurological complication: Secondary | ICD-10-CM

## 2018-08-17 MED ORDER — SEMAGLUTIDE(0.25 OR 0.5MG/DOS) 2 MG/1.5ML ~~LOC~~ SOPN: 1.0000 mg | PEN_INJECTOR | SUBCUTANEOUS | 5 refills | Status: DC

## 2018-08-17 NOTE — Telephone Encounter (Signed)
Yes, continue taking the VIt D supplement as directed RX for ozempic sent

## 2018-08-17 NOTE — Progress Notes (Signed)
Post-Op Visit Note   Patient: Abigail Collins           Date of Birth: May 24, 1951           MRN: [id number] Visit Date: 08/17/2018 PCP: Janith Lima, MD   Assessment & Plan:  Chief Complaint:  Chief Complaint  Patient presents with  . Left Knee - Routine Post Op   Visit Diagnoses:  1. Status post left knee replacement     Plan: Patient is a pleasant 67 year old female who presents to our clinic today 2 weeks status post left total knee replacement, date of surgery 08/02/2018.  She has been doing okay since surgery.  Her main complaint has been constipation.  She did start having normal bowel movements about 2 to 3 days ago.  She has been taking a stool softener and laxative.  She has backed off on her pain medicine which does appear to have helped.  She has been getting home health physical therapy where she has made great progress.  No fevers or chills.  Examination of the left knee reveals a well healing surgical incision without evidence of infection.  Calf is soft and nontender.  She is neurovascular intact distally.  Today, her incision was cleaned and Steri-Strips applied.  I have provided the patient with an outpatient physical therapy prescription.  She will follow-up with [place] in 4 weeks time for repeat evaluation and x-ray.  Follow-Up Instructions: Return in about 4 weeks (around 09/14/2018).   Orders:  Orders Placed This Encounter  Procedures  . XR Knee 1-2 Views Left   No orders of the defined types were placed in this encounter.   Imaging: Xr Knee 1-2 Views Left  Result Date: 08/17/2018 Well-seated prosthesis without evidence of subsidence or other acute findings   PMFS History: Patient Active Problem List   Diagnosis Date Noted  . Status post left knee replacement 08/02/2018  . Chronic pain of left knee 05/26/2018  . Frequency of urination 05/19/2018  . Vitamin D deficiency disease 05/07/2018  . Age-related osteoporosis with current pathological fracture  02/02/2018  . Closed fracture of right proximal humerus 01/06/2018  . Excessive daytime sleepiness 05/27/2016  . COPD mixed type ([place]) 01/09/2016  . Visit for screening mammogram 03/28/2015  . Colon cancer screening 03/28/2015  . Depression with somatization 03/28/2015  . Insomnia 03/28/2015  . Primary osteoarthritis of both knees 03/28/2015  . Allergic rhinitis 03/15/2014  . Hyperlipidemia with target LDL less than 100 12/22/2013  . Routine general medical examination at a health care facility 08/03/2013  . Presence of unspecified artificial knee joint 10/28/2012  . Pure hyperglyceridemia 08/05/2012  . Gastroparesis due to DM ([place]) 08/05/2012  . Other screening mammogram 08/05/2012  . Diabetes mellitus with neurological manifestations, uncontrolled ([place]) 04/05/2012  . HTN (hypertension), benign 04/05/2012  . GERD (gastroesophageal reflux disease) 04/05/2012  . Morbid obesity ([place]) 04/05/2012  . Primary osteoarthritis of left knee 06/19/2011   Past Medical History:  Diagnosis Date  . Anemia    hx of  . Anginal pain ([place])    "left arm pain, sees Dr. Etter Sjogren, had card cath 2013"  . Arthritis    "all over" (09/14/2012)  . Asthma    takes inhaler 2x day  . Bronchitis    hx of  . Bulging disc    "lower"  . Carpal tunnel syndrome of left wrist   . Depression    denies  . Exertional dyspnea    "sometimes laying down" (09/14/2012)  .  Headache    frequent headaches,usually if she does not eat  . Hyperlipidemia   . Hypertension    sees Dr. Debby Freiberg, primary  . Pneumonia 03/2012  . PONV (postoperative nausea and vomiting)   . Thyroid disease 1960's   "don't have it now" (09/14/2012)  . Type II diabetes mellitus ([place])   . Urinary tract infection    hx of  . Vomiting    pt states she vomits every am    Family History  Problem Relation Age of Onset  . Arthritis Mother   . Arthritis Father   . Hypertension Father   . Diabetes Father   . Colon cancer Neg Hx     Past  Surgical History:  Procedure Laterality Date  . CARDIAC CATHETERIZATION  05/13/2012   mod luminal irregularity of pLAD, no sign CAD, EF 65%.  . CHOLECYSTECTOMY  1970's  . KYPHOPLASTY N/A 02/10/2013   Procedure:  LUMBAR TWO KYPHOPLASTY;  Surgeon: Ophelia Charter, MD;  Location: [place] [facility];  Service: Neurosurgery;  Laterality: N/A;  L2 Kyphoplasty; Will use Stern's Carm.  . ORIF HUMERUS FRACTURE Right 01/06/2018   Procedure: OPEN REDUCTION INTERNAL FIXATION (ORIF) RIGHT PROXIMAL HUMERUS FRACTURE;  Surgeon: Leandrew Koyanagi, MD;  Location: [place];  Service: Orthopedics;  Laterality: Right;  . TOTAL ELBOW REPLACEMENT  ~ 2005   "right" (09/14/2012)  . TOTAL KNEE ARTHROPLASTY  09/13/2012   Procedure: TOTAL KNEE ARTHROPLASTY;  Surgeon: Vickey Huger, MD;  Location: [place];  Service: Orthopedics;  Laterality: Right;  . TOTAL KNEE ARTHROPLASTY Left 08/02/2018   Procedure: LEFT TOTAL KNEE ARTHROPLASTY;  Surgeon: Leandrew Koyanagi, MD;  Location: [place];  Service: Orthopedics;  Laterality: Left;  . TUBAL LIGATION  1970's  . VAGINAL HYSTERECTOMY  1970's   Social History   Occupational History  . Not on file  Tobacco Use  . Smoking status: Former Smoker    Packs/day: 0.25    Years: 20.00    Pack years: 5.00    Types: Cigarettes    Last attempt to quit: 10/13/2009    Years since quitting: 8.8  . Smokeless tobacco: Never Used  Substance and Sexual Activity  . Alcohol use: No    Comment: 09/14/2012 "did drink a little in my younger days"  . Drug use: No  . Sexual activity: Not Currently

## 2018-08-17 NOTE — Telephone Encounter (Signed)
Pt informed of MD response and recommendation and that rx for ozempic has been sent.

## 2018-08-18 ENCOUNTER — Ambulatory Visit: Payer: Self-pay

## 2018-08-18 ENCOUNTER — Other Ambulatory Visit: Payer: Self-pay

## 2018-08-18 NOTE — Patient Outreach (Signed)
[facility]) Care Management  08/18/2018  Abigail Collins October 26, 1950 [id number]   In-basket message received from Dr Ronnald Ramp' office.  They are able to provide the patient with six weeks of Ozempic samples.  Doctor also wants her to continue with Vitamin D 50,000 units/weekly.  I called and updated the patient to make her aware that she needs to pick up her Ozempic samples from the office and to get her Vitamin D refilled at her pharmacy (she had previously reported that she wasn't taking it.)  She states she will also refill her atorvastatin this week when she gets her check.    [name], PharmD Clinical Pharmacist [place] [phone]

## 2018-08-25 DIAGNOSIS — M25662 Stiffness of left knee, not elsewhere classified: Secondary | ICD-10-CM | POA: Diagnosis not present

## 2018-08-25 DIAGNOSIS — M62552 Muscle wasting and atrophy, not elsewhere classified, left thigh: Secondary | ICD-10-CM | POA: Diagnosis not present

## 2018-08-25 DIAGNOSIS — R262 Difficulty in walking, not elsewhere classified: Secondary | ICD-10-CM | POA: Diagnosis not present

## 2018-08-25 DIAGNOSIS — M25562 Pain in left knee: Secondary | ICD-10-CM | POA: Diagnosis not present

## 2018-09-01 DIAGNOSIS — M25562 Pain in left knee: Secondary | ICD-10-CM | POA: Diagnosis not present

## 2018-09-01 DIAGNOSIS — R262 Difficulty in walking, not elsewhere classified: Secondary | ICD-10-CM | POA: Diagnosis not present

## 2018-09-01 DIAGNOSIS — M25662 Stiffness of left knee, not elsewhere classified: Secondary | ICD-10-CM | POA: Diagnosis not present

## 2018-09-01 DIAGNOSIS — M62552 Muscle wasting and atrophy, not elsewhere classified, left thigh: Secondary | ICD-10-CM | POA: Diagnosis not present

## 2018-09-02 DIAGNOSIS — M62552 Muscle wasting and atrophy, not elsewhere classified, left thigh: Secondary | ICD-10-CM | POA: Diagnosis not present

## 2018-09-02 DIAGNOSIS — M25662 Stiffness of left knee, not elsewhere classified: Secondary | ICD-10-CM | POA: Diagnosis not present

## 2018-09-02 DIAGNOSIS — R262 Difficulty in walking, not elsewhere classified: Secondary | ICD-10-CM | POA: Diagnosis not present

## 2018-09-02 DIAGNOSIS — M25562 Pain in left knee: Secondary | ICD-10-CM | POA: Diagnosis not present

## 2018-09-08 DIAGNOSIS — M62552 Muscle wasting and atrophy, not elsewhere classified, left thigh: Secondary | ICD-10-CM | POA: Diagnosis not present

## 2018-09-08 DIAGNOSIS — M25662 Stiffness of left knee, not elsewhere classified: Secondary | ICD-10-CM | POA: Diagnosis not present

## 2018-09-08 DIAGNOSIS — M25562 Pain in left knee: Secondary | ICD-10-CM | POA: Diagnosis not present

## 2018-09-08 DIAGNOSIS — R262 Difficulty in walking, not elsewhere classified: Secondary | ICD-10-CM | POA: Diagnosis not present

## 2018-09-14 ENCOUNTER — Encounter (INDEPENDENT_AMBULATORY_CARE_PROVIDER_SITE_OTHER): Payer: Self-pay | Admitting: Orthopaedic Surgery

## 2018-09-14 ENCOUNTER — Ambulatory Visit (INDEPENDENT_AMBULATORY_CARE_PROVIDER_SITE_OTHER): Payer: PPO | Admitting: Orthopaedic Surgery

## 2018-09-14 ENCOUNTER — Telehealth: Payer: Self-pay | Admitting: Internal Medicine

## 2018-09-14 ENCOUNTER — Ambulatory Visit (INDEPENDENT_AMBULATORY_CARE_PROVIDER_SITE_OTHER): Payer: PPO

## 2018-09-14 DIAGNOSIS — Z96652 Presence of left artificial knee joint: Secondary | ICD-10-CM

## 2018-09-14 DIAGNOSIS — E1165 Type 2 diabetes mellitus with hyperglycemia: Secondary | ICD-10-CM

## 2018-09-14 DIAGNOSIS — E1149 Type 2 diabetes mellitus with other diabetic neurological complication: Secondary | ICD-10-CM

## 2018-09-14 DIAGNOSIS — IMO0002 Reserved for concepts with insufficient information to code with codable children: Secondary | ICD-10-CM

## 2018-09-14 MED ORDER — METFORMIN HCL 1000 MG PO TABS: 1000.0000 mg | ORAL_TABLET | Freq: Two times a day (BID) | ORAL | 0 refills | Status: DC

## 2018-09-14 MED ORDER — HYDROCODONE-ACETAMINOPHEN 5-325 MG PO TABS: 1.0000 | ORAL_TABLET | Freq: Every day | ORAL | 0 refills | Status: DC | PRN

## 2018-09-14 NOTE — Telephone Encounter (Signed)
Copied from [place] [phone]. Topic: Quick Communication - Rx Refill/Question >> Sep 14, 2018  7:32 AM Scherrie Gerlach wrote: Medication: metFORMIN (GLUCOPHAGE) 1000 MG tablet  Has the patient contacted their pharmacy? Yes Pt states she was told by the pharmacy she had to call the doctor.  [id number] [address], [place] [phone] (Phone) [phone] (Fax)

## 2018-09-14 NOTE — Telephone Encounter (Signed)
Insurance has been submitted and verified for Prolia. Patient is responsible for a $250 copay. Due anytime. Left message for patient to call back to schedule.  Okay to schedule... Visit Note: Prolia ($250 copay - okay to give per Gareth Eagle) Visit Type: Nurse Provider: Nurse  Can also be added to Dr Ronnald Ramp visit.

## 2018-09-14 NOTE — Telephone Encounter (Signed)
erx has been sent as requested.  

## 2018-09-14 NOTE — Progress Notes (Signed)
Post-Op Visit Note   Patient: Abigail Collins           Date of Birth: 18-May-1951           MRN: [id number] Visit Date: 09/14/2018 PCP: Janith Lima, MD   Assessment & Plan:  Chief Complaint:  Chief Complaint  Patient presents with  . Left Knee - Pain   Visit Diagnoses:  1. Status post left knee replacement     Plan: Abigail Collins is 6 weeks status post left total knee replacement.  She is overall doing well.  She is doing physical therapy twice a week.  She just takes over-the-counter medicines for pain.  She continues to ambulate with a walker due to weakness but this is likely due to her recent right proximal humerus fracture from which she is still recovering from.  I think overall she is doing fine.  Her range of motion is 0-110 and 120 degrees.  Her surgical scar is fully healed.  Precautions were reviewed with the patient today.  Recheck in 6 weeks.  She may discontinue DVT prophylaxis at this point.  Follow-Up Instructions: Return in about 6 weeks (around 10/26/2018).   Orders:  Orders Placed This Encounter  Procedures  . XR KNEE 3 VIEW LEFT   No orders of the defined types were placed in this encounter.   Imaging: Xr Knee 3 View Left  Result Date: 09/14/2018 Stable left total knee replacement.   PMFS History: Patient Active Problem List   Diagnosis Date Noted  . Status post left knee replacement 08/02/2018  . Chronic pain of left knee 05/26/2018  . Frequency of urination 05/19/2018  . Vitamin D deficiency disease 05/07/2018  . Age-related osteoporosis with current pathological fracture 02/02/2018  . Closed fracture of right proximal humerus 01/06/2018  . Excessive daytime sleepiness 05/27/2016  . COPD mixed type ([place]) 01/09/2016  . Visit for screening mammogram 03/28/2015  . Colon cancer screening 03/28/2015  . Depression with somatization 03/28/2015  . Insomnia 03/28/2015  . Primary osteoarthritis of both knees 03/28/2015  . Allergic rhinitis 03/15/2014   . Hyperlipidemia with target LDL less than 100 12/22/2013  . Routine general medical examination at a health care facility 08/03/2013  . Presence of unspecified artificial knee joint 10/28/2012  . Pure hyperglyceridemia 08/05/2012  . Gastroparesis due to DM ([place]) 08/05/2012  . Other screening mammogram 08/05/2012  . Diabetes mellitus with neurological manifestations, uncontrolled ([place]) 04/05/2012  . HTN (hypertension), benign 04/05/2012  . GERD (gastroesophageal reflux disease) 04/05/2012  . Morbid obesity ([place]) 04/05/2012  . Primary osteoarthritis of left knee 06/19/2011   Past Medical History:  Diagnosis Date  . Anemia    hx of  . Anginal pain ([place])    "left arm pain, sees Dr. Etter Sjogren, had card cath 2013"  . Arthritis    "all over" (09/14/2012)  . Asthma    takes inhaler 2x day  . Bronchitis    hx of  . Bulging disc    "lower"  . Carpal tunnel syndrome of left wrist   . Depression    denies  . Exertional dyspnea    "sometimes laying down" (09/14/2012)  . Headache    frequent headaches,usually if she does not eat  . Hyperlipidemia   . Hypertension    sees Dr. Debby Freiberg, primary  . Pneumonia 03/2012  . PONV (postoperative nausea and vomiting)   . Thyroid disease 1960's   "don't have it now" (09/14/2012)  . Type II diabetes mellitus ([place])   .  Urinary tract infection    hx of  . Vomiting    pt states she vomits every am    Family History  Problem Relation Age of Onset  . Arthritis Mother   . Arthritis Father   . Hypertension Father   . Diabetes Father   . Colon cancer Neg Hx     Past Surgical History:  Procedure Laterality Date  . CARDIAC CATHETERIZATION  05/13/2012   mod luminal irregularity of pLAD, no sign CAD, EF 65%.  . CHOLECYSTECTOMY  1970's  . KYPHOPLASTY N/A 02/10/2013   Procedure:  LUMBAR TWO KYPHOPLASTY;  Surgeon: Ophelia Charter, MD;  Location: [facility];  Service: Neurosurgery;  Laterality: N/A;  L2 Kyphoplasty; Will use Stern's Carm.  . ORIF  HUMERUS FRACTURE Right 01/06/2018   Procedure: OPEN REDUCTION INTERNAL FIXATION (ORIF) RIGHT PROXIMAL HUMERUS FRACTURE;  Surgeon: Leandrew Koyanagi, MD;  Location: [facility];  Service: Orthopedics;  Laterality: Right;  . TOTAL ELBOW REPLACEMENT  ~ 2005   "right" (09/14/2012)  . TOTAL KNEE ARTHROPLASTY  09/13/2012   Procedure: TOTAL KNEE ARTHROPLASTY;  Surgeon: Vickey Huger, MD;  Location: [facility];  Service: Orthopedics;  Laterality: Right;  . TOTAL KNEE ARTHROPLASTY Left 08/02/2018   Procedure: LEFT TOTAL KNEE ARTHROPLASTY;  Surgeon: Leandrew Koyanagi, MD;  Location: [facility];  Service: Orthopedics;  Laterality: Left;  . TUBAL LIGATION  1970's  . VAGINAL HYSTERECTOMY  1970's   Social History   Occupational History  . Not on file  Tobacco Use  . Smoking status: Former Smoker    Packs/day: 0.25    Years: 20.00    Pack years: 5.00    Types: Cigarettes    Last attempt to quit: 10/13/2009    Years since quitting: 8.9  . Smokeless tobacco: Never Used  Substance and Sexual Activity  . Alcohol use: No    Comment: 09/14/2012 "did drink a little in my younger days"  . Drug use: No  . Sexual activity: Not Currently

## 2018-09-15 ENCOUNTER — Telehealth (INDEPENDENT_AMBULATORY_CARE_PROVIDER_SITE_OTHER): Payer: Self-pay | Admitting: Orthopaedic Surgery

## 2018-09-15 NOTE — Telephone Encounter (Signed)
Patient called asked when can she take a sit down bath? The number to contact patient is [phone]

## 2018-09-15 NOTE — Telephone Encounter (Signed)
See message below °

## 2018-09-15 NOTE — Telephone Encounter (Signed)
She can do that now.

## 2018-09-16 DIAGNOSIS — M25662 Stiffness of left knee, not elsewhere classified: Secondary | ICD-10-CM | POA: Diagnosis not present

## 2018-09-16 DIAGNOSIS — R262 Difficulty in walking, not elsewhere classified: Secondary | ICD-10-CM | POA: Diagnosis not present

## 2018-09-16 DIAGNOSIS — M62552 Muscle wasting and atrophy, not elsewhere classified, left thigh: Secondary | ICD-10-CM | POA: Diagnosis not present

## 2018-09-16 DIAGNOSIS — M25562 Pain in left knee: Secondary | ICD-10-CM | POA: Diagnosis not present

## 2018-09-16 NOTE — Telephone Encounter (Signed)
Called patient

## 2018-09-23 DIAGNOSIS — M25662 Stiffness of left knee, not elsewhere classified: Secondary | ICD-10-CM | POA: Diagnosis not present

## 2018-09-23 DIAGNOSIS — R262 Difficulty in walking, not elsewhere classified: Secondary | ICD-10-CM | POA: Diagnosis not present

## 2018-09-23 DIAGNOSIS — M25562 Pain in left knee: Secondary | ICD-10-CM | POA: Diagnosis not present

## 2018-09-23 DIAGNOSIS — M62552 Muscle wasting and atrophy, not elsewhere classified, left thigh: Secondary | ICD-10-CM | POA: Diagnosis not present

## 2018-09-27 DIAGNOSIS — M25562 Pain in left knee: Secondary | ICD-10-CM | POA: Diagnosis not present

## 2018-09-27 DIAGNOSIS — M62552 Muscle wasting and atrophy, not elsewhere classified, left thigh: Secondary | ICD-10-CM | POA: Diagnosis not present

## 2018-09-27 DIAGNOSIS — R262 Difficulty in walking, not elsewhere classified: Secondary | ICD-10-CM | POA: Diagnosis not present

## 2018-09-27 DIAGNOSIS — M25662 Stiffness of left knee, not elsewhere classified: Secondary | ICD-10-CM | POA: Diagnosis not present

## 2018-09-29 ENCOUNTER — Encounter: Payer: Self-pay | Admitting: Internal Medicine

## 2018-09-29 ENCOUNTER — Ambulatory Visit (INDEPENDENT_AMBULATORY_CARE_PROVIDER_SITE_OTHER): Payer: PPO | Admitting: Internal Medicine

## 2018-09-29 VITALS — BP 138/82 | HR 97 | Temp 97.8°F | Resp 16 | Ht 63.5 in | Wt 201.5 lb

## 2018-09-29 DIAGNOSIS — IMO0002 Reserved for concepts with insufficient information to code with codable children: Secondary | ICD-10-CM

## 2018-09-29 DIAGNOSIS — I10 Essential (primary) hypertension: Secondary | ICD-10-CM | POA: Diagnosis not present

## 2018-09-29 DIAGNOSIS — E1149 Type 2 diabetes mellitus with other diabetic neurological complication: Secondary | ICD-10-CM

## 2018-09-29 DIAGNOSIS — E1165 Type 2 diabetes mellitus with hyperglycemia: Secondary | ICD-10-CM

## 2018-09-29 DIAGNOSIS — Z1159 Encounter for screening for other viral diseases: Secondary | ICD-10-CM | POA: Insufficient documentation

## 2018-09-29 MED ORDER — METFORMIN HCL 1000 MG PO TABS: 1000.0000 mg | ORAL_TABLET | Freq: Two times a day (BID) | ORAL | 0 refills | Status: DC

## 2018-09-29 MED ORDER — SEMAGLUTIDE(0.25 OR 0.5MG/DOS) 2 MG/1.5ML ~~LOC~~ SOPN: 1.0000 mg | PEN_INJECTOR | SUBCUTANEOUS | 5 refills | Status: DC

## 2018-09-29 MED ORDER — IRBESARTAN 300 MG PO TABS: 300.0000 mg | ORAL_TABLET | Freq: Every day | ORAL | 1 refills | Status: DC

## 2018-09-29 NOTE — Patient Instructions (Signed)
Type 2 Diabetes Mellitus, Diagnosis, Adult Type 2 diabetes (type 2 diabetes mellitus) is a long-term (chronic) disease. In type 2 diabetes, one or both of these problems may be present:  The pancreas does not make enough of a hormone called insulin.  Cells in the body do not respond properly to insulin that the body makes (insulin resistance). Normally, insulin allows blood sugar (glucose) to enter cells in the body. The cells use glucose for energy. Insulin resistance or lack of insulin causes excess glucose to build up in the blood instead of going into cells. As a result, high blood glucose (hyperglycemia) develops. What increases the risk? The following factors may make you more likely to develop type 2 diabetes:  Having a family member with type 2 diabetes.  Being overweight or obese.  Having an inactive (sedentary) lifestyle.  Having been diagnosed with insulin resistance.  Having a history of prediabetes, gestational diabetes, or polycystic ovary syndrome (PCOS).  Being of American-Indian, African-American, Hispanic/Latino, or Asian/Pacific Islander descent. What are the signs or symptoms? In the early stage of this condition, you may not have symptoms. Symptoms develop slowly and may include:  Increased thirst (polydipsia).  Increased hunger(polyphagia).  Increased urination (polyuria).  Increased urination during the night (nocturia).  Unexplained weight loss.  Frequent infections that keep coming back (recurring).  Fatigue.  Weakness.  Vision changes, such as blurry vision.  Cuts or bruises that are slow to heal.  Tingling or numbness in the hands or feet.  Dark patches on the skin (acanthosis nigricans). How is this diagnosed? This condition is diagnosed based on your symptoms, your medical history, a physical exam, and your blood glucose level. Your blood glucose may be checked with one or more of the following blood tests:  A fasting blood glucose (FBG)  test. You will not be allowed to eat (you will fast) for 8 hours or longer before a blood sample is taken.  A random blood glucose test. This test checks blood glucose at any time of day regardless of when you ate.  An A1c (hemoglobin A1c) blood test. This test provides information about blood glucose control over the previous 2-3 months.  An oral glucose tolerance test (OGTT). This test measures your blood glucose at two times: ? After fasting. This is your baseline blood glucose level. ? Two hours after drinking a beverage that contains glucose. You may be diagnosed with type 2 diabetes if:  Your FBG level is 126 mg/dL (7.0 mmol/L) or higher.  Your random blood glucose level is 200 mg/dL (11.1 mmol/L) or higher.  Your A1c level is 6.5% or higher.  Your OGTT result is higher than 200 mg/dL (11.1 mmol/L). These blood tests may be repeated to confirm your diagnosis. How is this treated? Your treatment may be managed by a specialist called an endocrinologist. Type 2 diabetes may be treated by following instructions from your health care provider about:  Making diet and lifestyle changes. This may include: ? Following an individualized nutrition plan that is developed by a diet and nutrition specialist (registered dietitian). ? Exercising regularly. ? Finding ways to manage stress.  Checking your blood glucose level as often as told.  Taking diabetes medicines or insulin daily. This helps to keep your blood glucose levels in the healthy range. ? If you use insulin, you may need to adjust the dosage depending on how physically active you are and what foods you eat. Your health care provider will tell you how to adjust your dosage.    Taking medicines to help prevent complications from diabetes, such as: ? Aspirin. ? Medicine to lower cholesterol. ? Medicine to control blood pressure. Your health care provider will set individualized treatment goals for you. Your goals will be based on  your age, other medical conditions you have, and how you respond to diabetes treatment. Generally, the goal of treatment is to maintain the following blood glucose levels:  Before meals (preprandial): 80-130 mg/dL (4.4-7.2 mmol/L).  After meals (postprandial): below 180 mg/dL (10 mmol/L).  A1c level: less than 7%. Follow these instructions at home: Questions to ask your health care provider  Consider asking the following questions: ? Do I need to meet with a diabetes educator? ? Where can I find a support group for people with diabetes? ? What equipment will I need to manage my diabetes at home? ? What diabetes medicines do I need, and when should I take them? ? How often do I need to check my blood glucose? ? What number can I call if I have questions? ? When is my next appointment? General instructions  Take over-the-counter and prescription medicines only as told by your health care provider.  Keep all follow-up visits as told by your health care provider. This is important.  For more information about diabetes, visit: ? American Diabetes Association (ADA): www.diabetes.org ? American Association of Diabetes Educators (AADE): www.diabeteseducator.org Contact a health care provider if:  Your blood glucose is at or above 240 mg/dL (13.3 mmol/L) for 2 days in a row.  You have been sick or have had a fever for 2 days or longer, and you are not getting better.  You have any of the following problems for more than 6 hours: ? You cannot eat or drink. ? You have nausea and vomiting. ? You have diarrhea. Get help right away if:  Your blood glucose is lower than 54 mg/dL (3.0 mmol/L).  You become confused or you have trouble thinking clearly.  You have difficulty breathing.  You have moderate or large ketone levels in your urine. Summary  Type 2 diabetes (type 2 diabetes mellitus) is a long-term (chronic) disease. In type 2 diabetes, the pancreas does not make enough of a  hormone called insulin, or cells in the body do not respond properly to insulin that the body makes (insulin resistance).  This condition is treated by making diet and lifestyle changes and taking diabetes medicines or insulin.  Your health care provider will set individualized treatment goals for you. Your goals will be based on your age, other medical conditions you have, and how you respond to diabetes treatment.  Keep all follow-up visits as told by your health care provider. This is important. This information is not intended to replace advice given to you by your health care provider. Make sure you discuss any questions you have with your health care provider. Document Released: 09/29/2005 Document Revised: 04/30/2017 Document Reviewed: 11/02/2015 Elsevier Interactive Patient Education  2019 Elsevier Inc.  

## 2018-09-29 NOTE — Progress Notes (Signed)
Subjective:  Patient ID: Abigail Collins, female    DOB: [birth year]  Age: 67 y.o. MRN: [id number]  CC: Hypertension and Diabetes   HPI Abigail Collins presents for f/up - Her blood pressure and blood sugars have been well controlled.  She is having a good response to Ozempic and wants to continue using it.  Outpatient Medications Prior to Visit  Medication Sig Dispense Refill  . albuterol (PROVENTIL) (2.5 MG/3ML) 0.083% nebulizer solution Inhale 2.5 mg into the lungs every 6 (six) hours as needed for wheezing or shortness of breath. Reported on 01/17/2016    . aspirin EC 81 MG tablet Take 1 tablet (81 mg total) by mouth 2 (two) times daily. 84 tablet 0  . atorvastatin (LIPITOR) 20 MG tablet Take 1 tablet (20 mg total) by mouth daily. 90 tablet 1  . Blood Glucose Monitoring Suppl (ONETOUCH VERIO IQ SYSTEM) W/DEVICE KIT 1 Act by Does not apply route 3 (three) times daily. 2 kit 0  . Insulin Glargine (TOUJEO MAX SOLOSTAR) 300 UNIT/ML SOPN Inject 50 Units into the skin at bedtime as needed (blood sugars more than 170).    . Insulin Pen Needle (NOVOFINE) 32G X 6 MM MISC 1 Act by Does not apply route daily. 100 each 3  . ONETOUCH VERIO test strip TEST THREE TIMES A DAY 200 each 2  . irbesartan (AVAPRO) 300 MG tablet TAKE 1 TABLET BY MOUTH ONCE DAILY (Patient taking differently: Take 300 mg by mouth daily. ) 90 tablet 1  . metFORMIN (GLUCOPHAGE) 1000 MG tablet Take 1 tablet (1,000 mg total) by mouth 2 (two) times daily with a meal. 180 tablet 0  . Semaglutide,0.25 or 0.5MG/DOS, (OZEMPIC, 0.25 OR 0.5 MG/DOSE,) 2 MG/1.5ML SOPN Inject 1 mg into the skin once a week. 1.5 mL 5  . Cholecalciferol 50000 units capsule Take 1 capsule (50,000 Units total) by mouth once a week. 12 capsule 1  . docusate sodium (COLACE) 100 MG capsule Take 100 mg by mouth daily.    Marland Kitchen HYDROcodone-acetaminophen (NORCO) 5-325 MG tablet Take 1-2 tablets by mouth daily as needed. 14 tablet 0  . methocarbamol (ROBAXIN) 750 MG  tablet Take 1 tablet (750 mg total) by mouth 2 (two) times daily as needed for muscle spasms. 60 tablet 0   No facility-administered medications prior to visit.     ROS Review of Systems  Constitutional: Negative for diaphoresis, fatigue and unexpected weight change.  HENT: Negative.   Eyes: Negative for visual disturbance.  Respiratory: Negative for cough, chest tightness and wheezing.   Cardiovascular: Negative for chest pain, palpitations and leg swelling.  Gastrointestinal: Negative for abdominal pain, constipation, diarrhea and nausea.  Endocrine: Negative for polyuria.  Genitourinary: Negative.  Negative for difficulty urinating.  Musculoskeletal: Negative.  Negative for arthralgias and myalgias.  Skin: Negative.  Negative for color change.  Neurological: Negative.  Negative for dizziness, weakness and light-headedness.  Hematological: Negative for adenopathy. Does not bruise/bleed easily.  Psychiatric/Behavioral: Negative.     Objective:  BP 138/82 (BP Location: Left Arm, Patient Position: Sitting, Cuff Size: Normal)   Pulse 97   Temp 97.8 F (36.6 C) (Oral)   Resp 16   Ht 5' 3.5" (1.613 m)   Wt 201 lb 8 oz (91.4 kg)   SpO2 97%   BMI 35.13 kg/m   BP Readings from Last 3 Encounters:  09/29/18 138/82  08/05/18 115/65  07/26/18 139/73    Wt Readings from Last 3 Encounters:  09/29/18 201 lb 8  oz (91.4 kg)  07/26/18 210 lb 11.2 oz (95.6 kg)  05/06/18 208 lb (94.3 kg)    Physical Exam Vitals signs reviewed.  Constitutional:      Appearance: She is obese. She is not ill-appearing.  HENT:     Nose: No congestion.     Mouth/Throat:     Mouth: Mucous membranes are moist.  Eyes:     Conjunctiva/sclera: Conjunctivae normal.  Neck:     Musculoskeletal: Normal range of motion and neck supple.  Cardiovascular:     Rate and Rhythm: Normal rate and regular rhythm.     Pulses: Normal pulses.     Heart sounds: No murmur. No gallop.   Pulmonary:     Effort: Pulmonary  effort is normal.     Breath sounds: Normal breath sounds. No stridor. No wheezing, rhonchi or rales.  Abdominal:     General: Bowel sounds are normal.     Palpations: There is no hepatomegaly, splenomegaly or mass.     Tenderness: There is no abdominal tenderness.  Musculoskeletal: Normal range of motion.     Right lower leg: No edema.     Left lower leg: No edema.  Skin:    General: Skin is warm and dry.     Coloration: Skin is not pale.  Neurological:     General: No focal deficit present.     Mental Status: She is oriented to person, place, and time. Mental status is at baseline.  Psychiatric:        Mood and Affect: Mood normal.     Lab Results  Component Value Date   WBC 6.9 08/03/2018   HGB 12.0 08/03/2018   HCT 39.1 08/03/2018   PLT 153 08/03/2018   GLUCOSE 168 (H) 08/03/2018   CHOL 179 05/06/2018   TRIG 260.0 (H) 05/06/2018   HDL 57.70 05/06/2018   LDLDIRECT 81.0 05/06/2018   LDLCALC UNABLE TO CALCULATE IF TRIGLYCERIDE OVER 400 mg/dL 04/06/2012   ALT 19 07/26/2018   AST 26 07/26/2018   NA 139 08/03/2018   K 4.2 08/03/2018   CL 108 08/03/2018   CREATININE 0.95 08/03/2018   BUN 16 08/03/2018   CO2 25 08/03/2018   TSH 2.22 05/06/2018   INR 0.91 07/26/2018   HGBA1C 6.7 (H) 07/26/2018   MICROALBUR 2.1 (H) 05/06/2018    Dg Chest 2 View  Result Date: 07/27/2018 CLINICAL DATA:  Pre-op respiratory exam for knee joint replacement. Right knee osteoarthritis. EXAM: CHEST - 2 VIEW COMPARISON:  06/19/2016 FINDINGS: The heart size and mediastinal contours are within normal limits. Aortic atherosclerosis. Both lungs are clear. Fixation plate and screws are seen in the right humerus and previous upper lumbar vertebroplasty is also noted. IMPRESSION: No active cardiopulmonary disease. Electronically Signed   By: Earle Gell M.D.   On: 07/27/2018 07:03    Assessment & Plan:   Abigail Collins was seen today for hypertension and diabetes.  Diagnoses and all orders for this  visit:  Need for hepatitis C screening test -     Hepatitis C antibody; Future  Diabetes mellitus with neurological manifestations, uncontrolled ([place])- Her blood sugars are adequately well controlled.  Will continue the combination of metformin and a GLP-1 agonist. -     irbesartan (AVAPRO) 300 MG tablet; Take 1 tablet (300 mg total) by mouth daily. -     Semaglutide,0.25 or 0.5MG/DOS, (OZEMPIC, 0.25 OR 0.5 MG/DOSE,) 2 MG/1.5ML SOPN; Inject 1 mg into the skin once a week. -  metFORMIN (GLUCOPHAGE) 1000 MG tablet; Take 1 tablet (1,000 mg total) by mouth 2 (two) times daily with a meal.  HTN (hypertension), benign- Her BP is well controlled.  Will continue the ARB. -     irbesartan (AVAPRO) 300 MG tablet; Take 1 tablet (300 mg total) by mouth daily.   I have discontinued Abigail Collins Cholecalciferol, methocarbamol, docusate sodium, and HYDROcodone-acetaminophen. I have also changed her irbesartan. Additionally, I am having her maintain her ONETOUCH VERIO IQ SYSTEM, albuterol, Insulin Pen Needle, atorvastatin, ONETOUCH VERIO, Insulin Glargine, aspirin EC, Semaglutide(0.25 or 0.5MG/DOS), and metFORMIN.  Meds ordered this encounter  Medications  . irbesartan (AVAPRO) 300 MG tablet    Sig: Take 1 tablet (300 mg total) by mouth daily.    Dispense:  90 tablet    Refill:  1  . Semaglutide,0.25 or 0.5MG/DOS, (OZEMPIC, 0.25 OR 0.5 MG/DOSE,) 2 MG/1.5ML SOPN    Sig: Inject 1 mg into the skin once a week.    Dispense:  1.5 mL    Refill:  5  . metFORMIN (GLUCOPHAGE) 1000 MG tablet    Sig: Take 1 tablet (1,000 mg total) by mouth 2 (two) times daily with a meal.    Dispense:  180 tablet    Refill:  0     Follow-up: Return in about 3 months (around 12/29/2018).  Scarlette Calico, MD

## 2018-10-04 DIAGNOSIS — M62552 Muscle wasting and atrophy, not elsewhere classified, left thigh: Secondary | ICD-10-CM | POA: Diagnosis not present

## 2018-10-04 DIAGNOSIS — M25662 Stiffness of left knee, not elsewhere classified: Secondary | ICD-10-CM | POA: Diagnosis not present

## 2018-10-04 DIAGNOSIS — R262 Difficulty in walking, not elsewhere classified: Secondary | ICD-10-CM | POA: Diagnosis not present

## 2018-10-04 DIAGNOSIS — M25562 Pain in left knee: Secondary | ICD-10-CM | POA: Diagnosis not present

## 2018-10-14 DIAGNOSIS — R2681 Unsteadiness on feet: Secondary | ICD-10-CM | POA: Diagnosis not present

## 2018-10-14 DIAGNOSIS — M25662 Stiffness of left knee, not elsewhere classified: Secondary | ICD-10-CM | POA: Diagnosis not present

## 2018-10-14 DIAGNOSIS — M25562 Pain in left knee: Secondary | ICD-10-CM | POA: Diagnosis not present

## 2018-10-14 DIAGNOSIS — M7918 Myalgia, other site: Secondary | ICD-10-CM | POA: Diagnosis not present

## 2018-10-14 DIAGNOSIS — M25462 Effusion, left knee: Secondary | ICD-10-CM | POA: Diagnosis not present

## 2018-10-14 DIAGNOSIS — R262 Difficulty in walking, not elsewhere classified: Secondary | ICD-10-CM | POA: Diagnosis not present

## 2018-10-14 DIAGNOSIS — M62552 Muscle wasting and atrophy, not elsewhere classified, left thigh: Secondary | ICD-10-CM | POA: Diagnosis not present

## 2018-10-18 DIAGNOSIS — M62552 Muscle wasting and atrophy, not elsewhere classified, left thigh: Secondary | ICD-10-CM | POA: Diagnosis not present

## 2018-10-18 DIAGNOSIS — M7918 Myalgia, other site: Secondary | ICD-10-CM | POA: Diagnosis not present

## 2018-10-18 DIAGNOSIS — M25462 Effusion, left knee: Secondary | ICD-10-CM | POA: Diagnosis not present

## 2018-10-18 DIAGNOSIS — R2681 Unsteadiness on feet: Secondary | ICD-10-CM | POA: Diagnosis not present

## 2018-10-18 DIAGNOSIS — R262 Difficulty in walking, not elsewhere classified: Secondary | ICD-10-CM | POA: Diagnosis not present

## 2018-10-18 DIAGNOSIS — M25562 Pain in left knee: Secondary | ICD-10-CM | POA: Diagnosis not present

## 2018-10-18 DIAGNOSIS — M25662 Stiffness of left knee, not elsewhere classified: Secondary | ICD-10-CM | POA: Diagnosis not present

## 2018-10-26 ENCOUNTER — Ambulatory Visit (INDEPENDENT_AMBULATORY_CARE_PROVIDER_SITE_OTHER): Payer: PPO | Admitting: Physician Assistant

## 2018-10-26 ENCOUNTER — Encounter (INDEPENDENT_AMBULATORY_CARE_PROVIDER_SITE_OTHER): Payer: Self-pay | Admitting: Orthopaedic Surgery

## 2018-10-26 DIAGNOSIS — Z96652 Presence of left artificial knee joint: Secondary | ICD-10-CM

## 2018-10-26 NOTE — Progress Notes (Signed)
Post-Op Visit Note   Patient: Abigail Collins           Date of Birth: [birth year]           MRN: [id number] Visit Date: 10/26/2018 PCP: Janith Lima, MD   Assessment & Plan:  Chief Complaint:  Chief Complaint  Patient presents with  . Left Knee - Pain, Follow-up   Visit Diagnoses:  1. S/P TKR (total knee replacement), left     Plan: Patient is a pleasant 68 year old female who presents to our clinic today 85 days status post left total knee replacement, date of surgery 08/02/2018.  She has been doing very well overall.  She is still in formal physical therapy where she is continuing to regain strength.  She is ambulating with a cane.  She is taking Aleve as needed.  Overall doing well.  Examination of her left knee reveals well-healed surgical incision without evidence of infection or cellulitis.  She does have a small effusion.  Range of motion 0 to 115 degrees.  Stable valgus varus stress.  She is neurovascularly intact distally.  Dental prophylaxis reinforced today.  Follow-up with [place] in 3 months time for repeat evaluation.  Call with concerns or questions in the meantime.  Follow-Up Instructions: Return in about 3 months (around 01/25/2019).   Orders:  No orders of the defined types were placed in this encounter.  No orders of the defined types were placed in this encounter.   Imaging: No new imaging  PMFS History: Patient Active Problem List   Diagnosis Date Noted  . Need for hepatitis C screening test 09/29/2018  . S/P TKR (total knee replacement), left 08/02/2018  . Vitamin D deficiency disease 05/07/2018  . Age-related osteoporosis with current pathological fracture 02/02/2018  . Excessive daytime sleepiness 05/27/2016  . COPD mixed type ([place]) 01/09/2016  . Visit for screening mammogram 03/28/2015  . Colon cancer screening 03/28/2015  . Depression with somatization 03/28/2015  . Insomnia 03/28/2015  . Primary osteoarthritis of both knees 03/28/2015  .  Allergic rhinitis 03/15/2014  . Hyperlipidemia with target LDL less than 100 12/22/2013  . Routine general medical examination at a health care facility 08/03/2013  . Pure hyperglyceridemia 08/05/2012  . Gastroparesis due to DM ([place]) 08/05/2012  . Other screening mammogram 08/05/2012  . Diabetes mellitus with neurological manifestations, uncontrolled ([place]) 04/05/2012  . HTN (hypertension), benign 04/05/2012  . GERD (gastroesophageal reflux disease) 04/05/2012  . Morbid obesity ([place]) 04/05/2012  . Primary osteoarthritis of left knee 06/19/2011   Past Medical History:  Diagnosis Date  . Anemia    hx of  . Anginal pain ([place])    "left arm pain, sees Dr. Etter Sjogren, had card cath 2013"  . Arthritis    "all over" (09/14/2012)  . Asthma    takes inhaler 2x day  . Bronchitis    hx of  . Bulging disc    "lower"  . Carpal tunnel syndrome of left wrist   . Depression    denies  . Exertional dyspnea    "sometimes laying down" (09/14/2012)  . Headache    frequent headaches,usually if she does not eat  . Hyperlipidemia   . Hypertension    sees Dr. Debby Freiberg, primary  . Pneumonia 03/2012  . PONV (postoperative nausea and vomiting)   . Thyroid disease 1960's   "don't have it now" (09/14/2012)  . Type II diabetes mellitus ([place])   . Urinary tract infection    hx of  .  Vomiting    pt states she vomits every am    Family History  Problem Relation Age of Onset  . Arthritis Mother   . Arthritis Father   . Hypertension Father   . Diabetes Father   . Colon cancer Neg Hx     Past Surgical History:  Procedure Laterality Date  . CARDIAC CATHETERIZATION  05/13/2012   mod luminal irregularity of pLAD, no sign CAD, EF 65%.  . CHOLECYSTECTOMY  1970's  . KYPHOPLASTY N/A 02/10/2013   Procedure:  LUMBAR TWO KYPHOPLASTY;  Surgeon: Ophelia Charter, MD;  Location: [facility];  Service: Neurosurgery;  Laterality: N/A;  L2 Kyphoplasty; Will use Stern's Carm.  . ORIF HUMERUS FRACTURE Right 01/06/2018    Procedure: OPEN REDUCTION INTERNAL FIXATION (ORIF) RIGHT PROXIMAL HUMERUS FRACTURE;  Surgeon: Leandrew Koyanagi, MD;  Location: [facility];  Service: Orthopedics;  Laterality: Right;  . TOTAL ELBOW REPLACEMENT  ~ 2005   "right" (09/14/2012)  . TOTAL KNEE ARTHROPLASTY  09/13/2012   Procedure: TOTAL KNEE ARTHROPLASTY;  Surgeon: Vickey Huger, MD;  Location: [facility];  Service: Orthopedics;  Laterality: Right;  . TOTAL KNEE ARTHROPLASTY Left 08/02/2018   Procedure: LEFT TOTAL KNEE ARTHROPLASTY;  Surgeon: Leandrew Koyanagi, MD;  Location: [facility];  Service: Orthopedics;  Laterality: Left;  . TUBAL LIGATION  1970's  . VAGINAL HYSTERECTOMY  1970's   Social History   Occupational History  . Not on file  Tobacco Use  . Smoking status: Former Smoker    Packs/day: 0.25    Years: 20.00    Pack years: 5.00    Types: Cigarettes    Last attempt to quit: 10/13/2009    Years since quitting: 9.0  . Smokeless tobacco: Never Used  Substance and Sexual Activity  . Alcohol use: No    Comment: 09/14/2012 "did drink a little in my younger days"  . Drug use: No  . Sexual activity: Not Currently

## 2018-10-28 DIAGNOSIS — M25662 Stiffness of left knee, not elsewhere classified: Secondary | ICD-10-CM | POA: Diagnosis not present

## 2018-10-28 DIAGNOSIS — M25562 Pain in left knee: Secondary | ICD-10-CM | POA: Diagnosis not present

## 2018-10-28 DIAGNOSIS — M62552 Muscle wasting and atrophy, not elsewhere classified, left thigh: Secondary | ICD-10-CM | POA: Diagnosis not present

## 2018-10-28 DIAGNOSIS — R262 Difficulty in walking, not elsewhere classified: Secondary | ICD-10-CM | POA: Diagnosis not present

## 2018-11-11 DIAGNOSIS — M25462 Effusion, left knee: Secondary | ICD-10-CM | POA: Diagnosis not present

## 2018-11-11 DIAGNOSIS — M7918 Myalgia, other site: Secondary | ICD-10-CM | POA: Diagnosis not present

## 2018-11-11 DIAGNOSIS — R2681 Unsteadiness on feet: Secondary | ICD-10-CM | POA: Diagnosis not present

## 2018-11-11 DIAGNOSIS — M25662 Stiffness of left knee, not elsewhere classified: Secondary | ICD-10-CM | POA: Diagnosis not present

## 2018-11-11 DIAGNOSIS — M25562 Pain in left knee: Secondary | ICD-10-CM | POA: Diagnosis not present

## 2018-11-11 DIAGNOSIS — M62552 Muscle wasting and atrophy, not elsewhere classified, left thigh: Secondary | ICD-10-CM | POA: Diagnosis not present

## 2018-11-11 DIAGNOSIS — R262 Difficulty in walking, not elsewhere classified: Secondary | ICD-10-CM | POA: Diagnosis not present

## 2018-11-22 ENCOUNTER — Telehealth: Payer: Self-pay | Admitting: Internal Medicine

## 2018-11-22 NOTE — Telephone Encounter (Signed)
Insurance has been submitted and verified for Prolia. Patient is responsible for a $255 copay. Due anytime. Left message for patient to call back to schedule.  Okay to schedule... Visit Note: Prolia ($255 copay - okay to give per [place]) Visit Type: Nurse Provider: Nurse 

## 2018-11-25 ENCOUNTER — Ambulatory Visit (HOSPITAL_COMMUNITY)
Admission: RE | Admit: 2018-11-25 | Discharge: 2018-11-25 | Disposition: A | Discharge status: Home / Self Care | Payer: PPO | Source: Ambulatory Visit | Attending: Nurse Practitioner | Admitting: Nurse Practitioner

## 2018-11-25 ENCOUNTER — Ambulatory Visit: Payer: Self-pay | Admitting: *Deleted

## 2018-11-25 ENCOUNTER — Ambulatory Visit (INDEPENDENT_AMBULATORY_CARE_PROVIDER_SITE_OTHER): Payer: PPO | Admitting: Nurse Practitioner

## 2018-11-25 ENCOUNTER — Encounter: Payer: Self-pay | Admitting: Nurse Practitioner

## 2018-11-25 VITALS — BP 120/80 | HR 106 | Ht 63.5 in | Wt 204.0 lb

## 2018-11-25 DIAGNOSIS — M79605 Pain in left leg: Secondary | ICD-10-CM | POA: Diagnosis not present

## 2018-11-25 NOTE — Progress Notes (Addendum)
Abigail Collins is a 68 y.o. female with the following history as recorded in EpicCare:  Patient Active Problem List   Diagnosis Date Noted  . Need for hepatitis C screening test 09/29/2018  . S/P TKR (total knee replacement), left 08/02/2018  . Vitamin D deficiency disease 05/07/2018  . Age-related osteoporosis with current pathological fracture 02/02/2018  . Excessive daytime sleepiness 05/27/2016  . COPD mixed type ([place]) 01/09/2016  . Visit for screening mammogram 03/28/2015  . Colon cancer screening 03/28/2015  . Depression with somatization 03/28/2015  . Insomnia 03/28/2015  . Primary osteoarthritis of both knees 03/28/2015  . Allergic rhinitis 03/15/2014  . Hyperlipidemia with target LDL less than 100 12/22/2013  . Routine general medical examination at a health care facility 08/03/2013  . Pure hyperglyceridemia 08/05/2012  . Gastroparesis due to DM ([place]) 08/05/2012  . Other screening mammogram 08/05/2012  . Diabetes mellitus with neurological manifestations, uncontrolled ([place]) 04/05/2012  . HTN (hypertension), benign 04/05/2012  . GERD (gastroesophageal reflux disease) 04/05/2012  . Morbid obesity ([place]) 04/05/2012  . Primary osteoarthritis of left knee 06/19/2011    Current Outpatient Medications  Medication Sig Dispense Refill  . albuterol (PROVENTIL) (2.5 MG/3ML) 0.083% nebulizer solution Inhale 2.5 mg into the lungs every 6 (six) hours as needed for wheezing or shortness of breath. Reported on 01/17/2016    . aspirin EC 81 MG tablet Take 1 tablet (81 mg total) by mouth 2 (two) times daily. 84 tablet 0  . atorvastatin (LIPITOR) 20 MG tablet Take 1 tablet (20 mg total) by mouth daily. 90 tablet 1  . Blood Glucose Monitoring Suppl (ONETOUCH VERIO IQ SYSTEM) W/DEVICE KIT 1 Act by Does not apply route 3 (three) times daily. 2 kit 0  . Insulin Glargine (TOUJEO MAX SOLOSTAR) 300 UNIT/ML SOPN Inject 50 Units into the skin at bedtime as needed (blood sugars more than 170).    .  Insulin Pen Needle (NOVOFINE) 32G X 6 MM MISC 1 Act by Does not apply route daily. 100 each 3  . irbesartan (AVAPRO) 300 MG tablet Take 1 tablet (300 mg total) by mouth daily. 90 tablet 1  . metFORMIN (GLUCOPHAGE) 1000 MG tablet Take 1 tablet (1,000 mg total) by mouth 2 (two) times daily with a meal. 180 tablet 0  . ONETOUCH VERIO test strip TEST THREE TIMES A DAY 200 each 2  . Semaglutide,0.25 or 0.5MG/DOS, (OZEMPIC, 0.25 OR 0.5 MG/DOSE,) 2 MG/1.5ML SOPN Inject 1 mg into the skin once a week. 1.5 mL 5   No current facility-administered medications for this visit.     Allergies: Lisinopril; Amlodipine; Tape; Morphine and related; and Percocet [oxycodone-acetaminophen]  Past Medical History:  Diagnosis Date  . Anemia    hx of  . Anginal pain ([place])    "left arm pain, sees Dr. Etter Sjogren, had card cath 2013"  . Arthritis    "all over" (09/14/2012)  . Asthma    takes inhaler 2x day  . Bronchitis    hx of  . Bulging disc    "lower"  . Carpal tunnel syndrome of left wrist   . Depression    denies  . Exertional dyspnea    "sometimes laying down" (09/14/2012)  . Headache    frequent headaches,usually if she does not eat  . Hyperlipidemia   . Hypertension    sees Dr. Debby Freiberg, primary  . Pneumonia 03/2012  . PONV (postoperative nausea and vomiting)   . Thyroid disease 1960's   "don't have it now" (09/14/2012)  .  Type II diabetes mellitus ([place])   . Urinary tract infection    hx of  . Vomiting    pt states she vomits every am    Past Surgical History:  Procedure Laterality Date  . CARDIAC CATHETERIZATION  05/13/2012   mod luminal irregularity of pLAD, no sign CAD, EF 65%.  . CHOLECYSTECTOMY  1970's  . KYPHOPLASTY N/A 02/10/2013   Procedure:  LUMBAR TWO KYPHOPLASTY;  Surgeon: Ophelia Charter, MD;  Location: [facility];  Service: Neurosurgery;  Laterality: N/A;  L2 Kyphoplasty; Will use Stern's Carm.  . ORIF HUMERUS FRACTURE Right 01/06/2018   Procedure: OPEN REDUCTION INTERNAL  FIXATION (ORIF) RIGHT PROXIMAL HUMERUS FRACTURE;  Surgeon: Leandrew Koyanagi, MD;  Location: [facility];  Service: Orthopedics;  Laterality: Right;  . TOTAL ELBOW REPLACEMENT  ~ 2005   "right" (09/14/2012)  . TOTAL KNEE ARTHROPLASTY  09/13/2012   Procedure: TOTAL KNEE ARTHROPLASTY;  Surgeon: Vickey Huger, MD;  Location: [facility];  Service: Orthopedics;  Laterality: Right;  . TOTAL KNEE ARTHROPLASTY Left 08/02/2018   Procedure: LEFT TOTAL KNEE ARTHROPLASTY;  Surgeon: Leandrew Koyanagi, MD;  Location: [facility];  Service: Orthopedics;  Laterality: Left;  . TUBAL LIGATION  1970's  . VAGINAL HYSTERECTOMY  1970's    Family History  Problem Relation Age of Onset  . Arthritis Mother   . Arthritis Father   . Hypertension Father   . Diabetes Father   . Colon cancer Neg Hx     Social History   Tobacco Use  . Smoking status: Former Smoker    Packs/day: 0.25    Years: 20.00    Pack years: 5.00    Types: Cigarettes    Last attempt to quit: 10/13/2009    Years since quitting: 9.1  . Smokeless tobacco: Never Used  Substance Use Topics  . Alcohol use: No    Comment: 09/14/2012 "did drink a little in my younger days"     Subjective:  Abigail Collins Is here today requesting evaluation of acute complaint of LLE pain, redness. She reports history of dark coloration to BLE, along with chronic mild swelling to BLE with left greater than right since left knee replacement surgery this last fall. However, she woke this am with new redness and tenderness to left posterior calf, the redness has improved through the day but the leg is still tender to touch and continues to feel aching pain in her left calf. Denies confusion, syncope, fevers, chills, cp, sob, palpitations, numbness, tingling. Denies recent long travel, injuries, falls. No history of DVT or PE  ROS- See HPI  Objective:  Vitals:   11/25/18 1447  BP: 120/80  Pulse: (!) 106  SpO2: 96%  Weight: 204 lb (92.5 kg)  Height: 5' 3.5" (1.613 m)    General: Well  developed, well nourished, in no acute distress  Skin : Warm and dry. Brawny coloration to BLE. No rash, erythema,warmth. Head: Normocephalic and atraumatic  Eyes: Sclera and conjunctiva clear; pupils round and reactive to light; extraocular movements intact  Oropharynx: Pink, supple. No suspicious lesions  Neck: Supple Lungs: Respirations unlabored; clear to auscultation bilaterally  CVS exam: normal rate and regular rhythm.  Musculoskeletal: No deformities; no active joint inflammation, mild tenderness to left posterior calf. Extremities: No cyanosis, clubbing. Scant BLE edema. Vessels: Symmetric bilaterally. Varicose veins scattered to BLE. Neurologic: Alert and oriented; speech intact; face symmetrical; moves all extremities well; CNII-XII intact without focal deficit  Psychiatric: Normal mood and affect.  Assessment:  1. Pain of left lower extremity     Plan:   LE [place] ordered to r/o DVT- this appointment has been scheduled and she will go immediately to [place] from her appointment here today, directions given and she is agreeable to this plan Red flags and return precautions including when to seek immediate/emergency care discussed and printed on AVS F/U with further recommendations pending [place] results  No follow-ups on file.  No orders of the defined types were placed in this encounter.   Requested Prescriptions    No prescriptions requested or ordered in this encounter

## 2018-11-25 NOTE — Patient Instructions (Addendum)
Please head to [place] for an ultrasound of your lower leg. Arrive at 3:45 to [place] main entrance- appointment at 4:00.

## 2018-11-25 NOTE — Progress Notes (Signed)
Left lower extremity venous has been completed.   Preliminary results in CV Proc.   Abigail Collins 11/25/2018 4:04 PM

## 2018-11-25 NOTE — Telephone Encounter (Signed)
Patient is calling to report she has red area of concern between her ankle and calf. It is hot and she states she has a burning sensation with it. Appointment today.  Reason for Disposition . [1] Thigh, calf, or ankle swelling AND [2] only 1 side  Answer Assessment - Initial Assessment Questions 1. ONSET: "When did the pain start?"      2 days 2. LOCATION: "Where is the pain located?"      L - between ankle and calf- outside in back 3. PAIN: "How bad is the pain?"    (Scale 1-10; or mild, moderate, severe)   -  MILD (1-3): doesn't interfere with normal activities    -  MODERATE (4-7): interferes with normal activities (e.g., work or school) or awakens from sleep, limping    -  SEVERE (8-10): excruciating pain, unable to do any normal activities, unable to walk     Moderate- burning sensation 4. WORK OR EXERCISE: "Has there been any recent work or exercise that involved this part of the body?"      Patient was in therapy- ended 2 weeks ago 5. CAUSE: "What do you think is causing the leg pain?"     Post surgical- October 6. OTHER SYMPTOMS: "Do you have any other symptoms?" (e.g., chest pain, back pain, breathing difficulty, swelling, rash, fever, numbness, weakness)     no 7. PREGNANCY: "Is there any chance you are pregnant?" "When was your last menstrual period?"     n/a  Protocols used: LEG PAIN-A-AH

## 2018-12-07 ENCOUNTER — Encounter: Payer: Self-pay | Admitting: *Deleted

## 2018-12-28 ENCOUNTER — Ambulatory Visit: Payer: PPO | Admitting: Internal Medicine

## 2019-01-05 ENCOUNTER — Encounter: Payer: Self-pay | Admitting: Internal Medicine

## 2019-01-25 ENCOUNTER — Ambulatory Visit (INDEPENDENT_AMBULATORY_CARE_PROVIDER_SITE_OTHER): Payer: PPO | Admitting: Orthopaedic Surgery

## 2019-04-04 ENCOUNTER — Telehealth: Payer: Self-pay | Admitting: Internal Medicine

## 2019-04-04 NOTE — Telephone Encounter (Signed)
Patient is calling to request samples of ozempic Please advise Cb- [phone]

## 2019-04-04 NOTE — Telephone Encounter (Signed)
Medication: irbesartan (AVAPRO) 300 MG tablet [[id number]] , metFORMIN (GLUCOPHAGE) 1000 MG tablet [[id number]]   Has the patient contacted their pharmacy? Yes  (Agent: If no, request that the patient contact the pharmacy for the refill.) (Agent: If yes, when and what did the pharmacy advise?)  Preferred Pharmacy (with phone number or street name): [id number] [address], [place] [phone] (Phone) [phone] (Fax)    Agent: Please be advised that RX refills may take up to 3 business days. We ask that you follow-up with your pharmacy

## 2019-04-05 NOTE — Telephone Encounter (Signed)
She is overdue for an appointment

## 2019-04-05 NOTE — Telephone Encounter (Signed)
Pt informed that we do not have any samples of Ozempic.

## 2019-04-06 ENCOUNTER — Other Ambulatory Visit: Payer: Self-pay | Admitting: Internal Medicine

## 2019-04-06 DIAGNOSIS — E1165 Type 2 diabetes mellitus with hyperglycemia: Secondary | ICD-10-CM

## 2019-04-06 DIAGNOSIS — E1149 Type 2 diabetes mellitus with other diabetic neurological complication: Secondary | ICD-10-CM

## 2019-04-06 DIAGNOSIS — I10 Essential (primary) hypertension: Secondary | ICD-10-CM

## 2019-04-06 DIAGNOSIS — IMO0002 Reserved for concepts with insufficient information to code with codable children: Secondary | ICD-10-CM

## 2019-04-06 MED ORDER — IRBESARTAN 300 MG PO TABS: 300.0000 mg | ORAL_TABLET | Freq: Every day | ORAL | 0 refills | Status: DC

## 2019-04-06 MED ORDER — METFORMIN HCL 1000 MG PO TABS: 1000.0000 mg | ORAL_TABLET | Freq: Two times a day (BID) | ORAL | 0 refills | Status: DC

## 2019-04-06 NOTE — Telephone Encounter (Signed)
Pt contacted and scheduled for Tuesday June 30th at 1pm.

## 2019-04-06 NOTE — Telephone Encounter (Signed)
Pt has scheduled and is requesting rx of metformin to [facility] please.

## 2019-04-12 ENCOUNTER — Ambulatory Visit (INDEPENDENT_AMBULATORY_CARE_PROVIDER_SITE_OTHER): Payer: PPO | Admitting: Internal Medicine

## 2019-04-12 ENCOUNTER — Encounter: Payer: Self-pay | Admitting: Internal Medicine

## 2019-04-12 ENCOUNTER — Other Ambulatory Visit: Payer: Self-pay

## 2019-04-12 ENCOUNTER — Other Ambulatory Visit (INDEPENDENT_AMBULATORY_CARE_PROVIDER_SITE_OTHER): Payer: PPO

## 2019-04-12 VITALS — BP 144/86 | HR 96 | Temp 97.9°F | Resp 16 | Ht 63.5 in | Wt 207.0 lb

## 2019-04-12 DIAGNOSIS — I10 Essential (primary) hypertension: Secondary | ICD-10-CM

## 2019-04-12 DIAGNOSIS — Z1159 Encounter for screening for other viral diseases: Secondary | ICD-10-CM

## 2019-04-12 DIAGNOSIS — E785 Hyperlipidemia, unspecified: Secondary | ICD-10-CM

## 2019-04-12 DIAGNOSIS — E781 Pure hyperglyceridemia: Secondary | ICD-10-CM | POA: Diagnosis not present

## 2019-04-12 DIAGNOSIS — E1165 Type 2 diabetes mellitus with hyperglycemia: Secondary | ICD-10-CM

## 2019-04-12 DIAGNOSIS — E559 Vitamin D deficiency, unspecified: Secondary | ICD-10-CM

## 2019-04-12 DIAGNOSIS — M810 Age-related osteoporosis without current pathological fracture: Secondary | ICD-10-CM | POA: Diagnosis not present

## 2019-04-12 DIAGNOSIS — E1149 Type 2 diabetes mellitus with other diabetic neurological complication: Secondary | ICD-10-CM

## 2019-04-12 DIAGNOSIS — Z1231 Encounter for screening mammogram for malignant neoplasm of breast: Secondary | ICD-10-CM

## 2019-04-12 DIAGNOSIS — N3281 Overactive bladder: Secondary | ICD-10-CM | POA: Diagnosis not present

## 2019-04-12 DIAGNOSIS — IMO0002 Reserved for concepts with insufficient information to code with codable children: Secondary | ICD-10-CM

## 2019-04-12 LAB — LIPID PANEL
Cholesterol: 168 mg/dL (ref 0–200)
HDL: 62.1 mg/dL (ref >39.00)
NonHDL: 105.94
Total CHOL/HDL Ratio: 3
Triglycerides: 274 mg/dL — ABNORMAL HIGH (ref 0.0–149.0)
VLDL: 54.8 mg/dL — ABNORMAL HIGH (ref 0.0–40.0)

## 2019-04-12 LAB — POCT GLYCOSYLATED HEMOGLOBIN (HGB A1C): Hemoglobin A1C: 6.5 % — AB (ref 4.0–5.6)

## 2019-04-12 LAB — CBC WITH DIFFERENTIAL/PLATELET
Basophils Absolute: 0 K/uL (ref 0.0–0.1)
Basophils Relative: 0.6 % (ref 0.0–3.0)
Eosinophils Absolute: 0.2 K/uL (ref 0.0–0.7)
Eosinophils Relative: 1.9 % (ref 0.0–5.0)
HCT: 42.5 % (ref 36.0–46.0)
Hemoglobin: 14.3 g/dL (ref 12.0–15.0)
Lymphocytes Relative: 41.2 % (ref 12.0–46.0)
Lymphs Abs: 3.6 K/uL (ref 0.7–4.0)
MCHC: 33.6 g/dL (ref 30.0–36.0)
MCV: 91 fl (ref 78.0–100.0)
Monocytes Absolute: 0.5 K/uL (ref 0.1–1.0)
Monocytes Relative: 5.4 % (ref 3.0–12.0)
Neutro Abs: 4.5 K/uL (ref 1.4–7.7)
Neutrophils Relative %: 50.9 % (ref 43.0–77.0)
Platelets: 205 K/uL (ref 150.0–400.0)
RBC: 4.67 Mil/uL (ref 3.87–5.11)
RDW: 13.6 % (ref 11.5–15.5)
WBC: 8.8 K/uL (ref 4.0–10.5)

## 2019-04-12 LAB — COMPREHENSIVE METABOLIC PANEL
ALT: 18 U/L (ref 0–35)
AST: 21 U/L (ref 0–37)
Albumin: 4.3 g/dL (ref 3.5–5.2)
Alkaline Phosphatase: 83 U/L (ref 39–117)
BUN: 18 mg/dL (ref 6–23)
CO2: 29 mEq/L (ref 19–32)
Calcium: 9.8 mg/dL (ref 8.4–10.5)
Chloride: 104 mEq/L (ref 96–112)
Creatinine, Ser: 0.72 mg/dL (ref 0.40–1.20)
GFR: 80.77 mL/min (ref >60.00)
Glucose, Bld: 103 mg/dL — ABNORMAL HIGH (ref 70–99)
Potassium: 4 mEq/L (ref 3.5–5.1)
Sodium: 142 mEq/L (ref 135–145)
Total Bilirubin: 0.6 mg/dL (ref 0.2–1.2)
Total Protein: 7.4 g/dL (ref 6.0–8.3)

## 2019-04-12 LAB — URINALYSIS, ROUTINE W REFLEX MICROSCOPIC
Bilirubin Urine: NEGATIVE
Hgb urine dipstick: NEGATIVE
Ketones, ur: NEGATIVE
Leukocytes,Ua: NEGATIVE
Nitrite: NEGATIVE
Specific Gravity, Urine: 1.025 (ref 1.000–1.030)
Total Protein, Urine: NEGATIVE
Urine Glucose: NEGATIVE
Urobilinogen, UA: 0.2 (ref 0.0–1.0)
pH: 5.5 (ref 5.0–8.0)

## 2019-04-12 LAB — HEMOGLOBIN A1C: Hemoglobin A1C: 6.5

## 2019-04-12 LAB — MICROALBUMIN / CREATININE URINE RATIO
Creatinine,U: 104.8 mg/dL
Microalb Creat Ratio: 0.8 mg/g (ref 0.0–30.0)
Microalb, Ur: 0.9 mg/dL (ref 0.0–1.9)

## 2019-04-12 LAB — TSH: TSH: 1.79 u[IU]/mL (ref 0.35–4.50)

## 2019-04-12 LAB — VITAMIN D 25 HYDROXY (VIT D DEFICIENCY, FRACTURES): VITD: 42.15 ng/mL (ref 30.00–100.00)

## 2019-04-12 LAB — LDL CHOLESTEROL, DIRECT: Direct LDL: 73 mg/dL

## 2019-04-12 MED ORDER — VASCEPA 1 G PO CAPS: 2.0000 | ORAL_CAPSULE | Freq: Two times a day (BID) | ORAL | 1 refills | Status: DC

## 2019-04-12 MED ORDER — MIRABEGRON ER 50 MG PO TB24: 50.0000 mg | ORAL_TABLET | Freq: Every day | ORAL | 1 refills | Status: DC

## 2019-04-12 NOTE — Progress Notes (Signed)
Subjective:  Patient ID: [name], female    DOB: 03/28/1951  Age: 68 y.o. MRN: [id number]  CC: Hypertension, Hyperlipidemia, and Diabetes   HPI AMEN DARGIS presents for f/up - She complains of worsening urinary frequency, urgency, and incontinence.  She does not monitor her blood sugar or her blood pressure.  Based on her symptoms she feels like these have both been well controlled.  She has been working on her lifestyle modifications.  Outpatient Medications Prior to Visit  Medication Sig Dispense Refill  . albuterol (PROVENTIL) (2.5 MG/3ML) 0.083% nebulizer solution Inhale 2.5 mg into the lungs every 6 (six) hours as needed for wheezing or shortness of breath. Reported on 01/17/2016    . atorvastatin (LIPITOR) 20 MG tablet Take 1 tablet (20 mg total) by mouth daily. 90 tablet 1  . Blood Glucose Monitoring Suppl (ONETOUCH VERIO IQ SYSTEM) W/DEVICE KIT 1 Act by Does not apply route 3 (three) times daily. 2 kit 0  . Insulin Pen Needle (NOVOFINE) 32G X 6 MM MISC 1 Act by Does not apply route daily. 100 each 3  . metFORMIN (GLUCOPHAGE) 1000 MG tablet Take 1 tablet (1,000 mg total) by mouth 2 (two) times daily with a meal. 60 tablet 0  . ONETOUCH VERIO test strip TEST THREE TIMES A DAY 200 each 2  . Semaglutide,0.25 or 0.5MG/DOS, (OZEMPIC, 0.25 OR 0.5 MG/DOSE,) 2 MG/1.5ML SOPN Inject 1 mg into the skin once a week. 1.5 mL 5  . aspirin EC 81 MG tablet Take 1 tablet (81 mg total) by mouth 2 (two) times daily. 84 tablet 0  . irbesartan (AVAPRO) 300 MG tablet Take 1 tablet (300 mg total) by mouth daily. 30 tablet 0  . Insulin Glargine (TOUJEO MAX SOLOSTAR) 300 UNIT/ML SOPN Inject 50 Units into the skin at bedtime as needed (blood sugars more than 170).     No facility-administered medications prior to visit.     ROS Review of Systems  Constitutional: Negative.  Negative for diaphoresis, fatigue and unexpected weight change.  HENT: Negative.   Eyes: Negative for visual  disturbance.  Respiratory: Negative for cough, chest tightness, shortness of breath and wheezing.   Cardiovascular: Negative for chest pain, palpitations and leg swelling.  Gastrointestinal: Negative for abdominal pain, constipation, diarrhea, nausea and vomiting.  Endocrine: Positive for polyuria. Negative for cold intolerance, heat intolerance, polydipsia and polyphagia.  Genitourinary: Positive for enuresis, frequency and urgency. Negative for difficulty urinating, dysuria, flank pain, hematuria and pelvic pain.  Musculoskeletal: Positive for arthralgias. Negative for myalgias.  Skin: Negative.  Negative for color change and pallor.  Neurological: Negative.  Negative for dizziness, weakness and light-headedness.  Hematological: Negative for adenopathy. Does not bruise/bleed easily.  Psychiatric/Behavioral: Negative.     Objective:  BP (!) 144/86 (BP Location: Left Arm, Patient Position: Sitting, Cuff Size: Normal)   Pulse 96   Temp 97.9 F (36.6 C) (Oral)   Resp 16   Ht 5' 3.5" (1.613 m)   Wt 207 lb (93.9 kg)   SpO2 96%   BMI 36.09 kg/m   BP Readings from Last 3 Encounters:  04/12/19 (!) 144/86  11/25/18 120/80  09/29/18 138/82    Wt Readings from Last 3 Encounters:  04/12/19 207 lb (93.9 kg)  11/25/18 204 lb (92.5 kg)  09/29/18 201 lb 8 oz (91.4 kg)    Physical Exam Constitutional:      Appearance: She is obese. She is not ill-appearing or diaphoretic.  HENT:  Nose: Nose normal.     Mouth/Throat:     Mouth: Mucous membranes are moist.  Eyes:     General: No scleral icterus.    Conjunctiva/sclera: Conjunctivae normal.  Neck:     Musculoskeletal: Normal range of motion. No neck rigidity or muscular tenderness.  Cardiovascular:     Rate and Rhythm: Normal rate and regular rhythm.     Heart sounds: No murmur.  Pulmonary:     Effort: Pulmonary effort is normal.     Breath sounds: No stridor. No wheezing, rhonchi or rales.  Abdominal:     General: Abdomen is  protuberant. There is no distension.     Palpations: Abdomen is soft. There is no hepatomegaly or splenomegaly.     Tenderness: There is no abdominal tenderness.  Musculoskeletal: Normal range of motion.     Right lower leg: No edema.     Left lower leg: No edema.  Lymphadenopathy:     Cervical: No cervical adenopathy.  Skin:    General: Skin is warm.  Neurological:     General: No focal deficit present.     Mental Status: She is alert.  Psychiatric:        Mood and Affect: Mood normal.        Thought Content: Thought content normal.        Judgment: Judgment normal.     Lab Results  Component Value Date   WBC 8.8 04/12/2019   HGB 14.3 04/12/2019   HCT 42.5 04/12/2019   PLT 205.0 04/12/2019   GLUCOSE 103 (H) 04/12/2019   CHOL 168 04/12/2019   TRIG 274.0 (H) 04/12/2019   HDL 62.10 04/12/2019   LDLDIRECT 73.0 04/12/2019   LDLCALC UNABLE TO CALCULATE IF TRIGLYCERIDE OVER 400 mg/dL 04/06/2012   ALT 18 04/12/2019   AST 21 04/12/2019   NA 142 04/12/2019   K 4.0 04/12/2019   CL 104 04/12/2019   CREATININE 0.72 04/12/2019   BUN 18 04/12/2019   CO2 29 04/12/2019   TSH 1.79 04/12/2019   INR 0.91 07/26/2018   HGBA1C 6.5 04/12/2019   MICROALBUR 0.9 04/12/2019    Vas [place] Lower Extremity Venous (dvt)  Result Date: 11/30/2018  Lower Venous Study Indications: Pain.  Performing Technologist: Abram Sander RVS  Examination Guidelines: A complete evaluation includes B-mode imaging, spectral Doppler, color Doppler, and power Doppler as needed of all accessible portions of each vessel. Bilateral testing is considered an integral part of a complete examination. Limited examinations for reoccurring indications may be performed as noted.  Right Venous Findings: +---+---------------+---------+-----------+----------+-------+    CompressibilityPhasicitySpontaneityPropertiesSummary +---+---------------+---------+-----------+----------+-------+ CFVFull           Yes      Yes                           +---+---------------+---------+-----------+----------+-------+  Left Venous Findings: +---------+---------------+---------+-----------+----------+--------------+          CompressibilityPhasicitySpontaneityPropertiesSummary        +---------+---------------+---------+-----------+----------+--------------+ CFV      Full           Yes      Yes                                 +---------+---------------+---------+-----------+----------+--------------+ SFJ      Full                                                        +---------+---------------+---------+-----------+----------+--------------+  FV Prox  Full                                                        +---------+---------------+---------+-----------+----------+--------------+ FV Mid   Full                                                        +---------+---------------+---------+-----------+----------+--------------+ FV DistalFull                                                        +---------+---------------+---------+-----------+----------+--------------+ PFV      Full                                                        +---------+---------------+---------+-----------+----------+--------------+ POP      Full           Yes      Yes                                 +---------+---------------+---------+-----------+----------+--------------+ PTV      Full                                                        +---------+---------------+---------+-----------+----------+--------------+ PERO                                                  Not visualized +---------+---------------+---------+-----------+----------+--------------+    Summary: Right: No evidence of common femoral vein obstruction. Left: There is no evidence of deep vein thrombosis in the lower extremity. No cystic structure found in the popliteal fossa.  *See table(s) above for measurements and observations.  Electronically signed by Deitra Mayo MD on 11/30/2018 at 2:49:43 PM.    Final     Assessment & Plan:   Clarene was seen today for hypertension, hyperlipidemia and diabetes.  Diagnoses and all orders for this visit:  HTN (hypertension), benign- Her blood pressure is not quite adequately well controlled.  She will continue the ARB and will continue working on her lifestyle modifications. -     CBC with Differential/Platelet; Future -     Comprehensive metabolic panel; Future -     TSH; Future -     Urinalysis, Routine w reflex microscopic; Future -     irbesartan (AVAPRO) 300 MG tablet; Take 1 tablet (300 mg total) by mouth daily.  Diabetes mellitus with neurological manifestations, uncontrolled ([place])- Her A1c is at 6.5%.  She has achieved good glycemic control.  Will continue the current regimen. -     Microalbumin / creatinine urine ratio; Future -     Comprehensive metabolic panel; Future -     Cancel: Hemoglobin A1c; Future -     HM Diabetes Foot Exam -     irbesartan (AVAPRO) 300 MG tablet; Take 1 tablet (300 mg total) by mouth daily.  Need for hepatitis C screening test -     Hepatitis C antibody; Future  Vitamin D deficiency disease -     VITAMIN D 25 Hydroxy (Vit-D Deficiency, Fractures); Future  Hyperlipidemia with target LDL less than 100- She has achieved her LDL goal and is doing well on the statin. -     Lipid panel; Future -     TSH; Future  Age-related osteoporosis without current pathological fracture -     DG Bone Density; Future  Visit for screening mammogram -     MM DIGITAL SCREENING BILATERAL; Future  OAB (overactive bladder) -     mirabegron ER (MYRBETRIQ) 50 MG TB24 tablet; Take 1 tablet (50 mg total) by mouth daily.  Pure hypertriglyceridemia- Her triglycerides are elevated and she has significant risk for cardiovascular disease.  I have asked her to start taking Vascepa to reduce the risk of complications from high triglycerides and for CV risk  reduction. -     Icosapent Ethyl (VASCEPA) 1 g CAPS; Take 2 capsules (2 g total) by mouth 2 (two) times daily.   I have discontinued Sonoma D. Backlund's Insulin Glargine and aspirin EC. I am also having her start on mirabegron ER and Vascepa. Additionally, I am having her maintain her OneTouch Verio IQ System, albuterol, Insulin Pen Needle, atorvastatin, OneTouch Verio, Semaglutide(0.25 or 0.5MG/DOS), metFORMIN, and irbesartan.  Meds ordered this encounter  Medications  . mirabegron ER (MYRBETRIQ) 50 MG TB24 tablet    Sig: Take 1 tablet (50 mg total) by mouth daily.    Dispense:  90 tablet    Refill:  1  . Icosapent Ethyl (VASCEPA) 1 g CAPS    Sig: Take 2 capsules (2 g total) by mouth 2 (two) times daily.    Dispense:  360 capsule    Refill:  1  . irbesartan (AVAPRO) 300 MG tablet    Sig: Take 1 tablet (300 mg total) by mouth daily.    Dispense:  90 tablet    Refill:  1     Follow-up: Return in about 6 months (around 10/12/2019).  Scarlette Calico, MD

## 2019-04-12 NOTE — Patient Instructions (Signed)
Type 2 Diabetes Mellitus, Diagnosis, Adult Type 2 diabetes (type 2 diabetes mellitus) is a long-term (chronic) disease. In type 2 diabetes, one or both of these problems may be present:  The pancreas does not make enough of a hormone called insulin.  Cells in the body do not respond properly to insulin that the body makes (insulin resistance). Normally, insulin allows blood sugar (glucose) to enter cells in the body. The cells use glucose for energy. Insulin resistance or lack of insulin causes excess glucose to build up in the blood instead of going into cells. As a result, high blood glucose (hyperglycemia) develops. What increases the risk? The following factors may make you more likely to develop type 2 diabetes:  Having a family member with type 2 diabetes.  Being overweight or obese.  Having an inactive (sedentary) lifestyle.  Having been diagnosed with insulin resistance.  Having a history of prediabetes, gestational diabetes, or polycystic ovary syndrome (PCOS).  Being of American-Indian, African-American, Hispanic/Latino, or Asian/Pacific Islander descent. What are the signs or symptoms? In the early stage of this condition, you may not have symptoms. Symptoms develop slowly and may include:  Increased thirst (polydipsia).  Increased hunger(polyphagia).  Increased urination (polyuria).  Increased urination during the night (nocturia).  Unexplained weight loss.  Frequent infections that keep coming back (recurring).  Fatigue.  Weakness.  Vision changes, such as blurry vision.  Cuts or bruises that are slow to heal.  Tingling or numbness in the hands or feet.  Dark patches on the skin (acanthosis nigricans). How is this diagnosed? This condition is diagnosed based on your symptoms, your medical history, a physical exam, and your blood glucose level. Your blood glucose may be checked with one or more of the following blood tests:  A fasting blood glucose (FBG)  test. You will not be allowed to eat (you will fast) for 8 hours or longer before a blood sample is taken.  A random blood glucose test. This test checks blood glucose at any time of day regardless of when you ate.  An A1c (hemoglobin A1c) blood test. This test provides information about blood glucose control over the previous 2-3 months.  An oral glucose tolerance test (OGTT). This test measures your blood glucose at two times: ? After fasting. This is your baseline blood glucose level. ? Two hours after drinking a beverage that contains glucose. You may be diagnosed with type 2 diabetes if:  Your FBG level is 126 mg/dL (7.0 mmol/L) or higher.  Your random blood glucose level is 200 mg/dL (11.1 mmol/L) or higher.  Your A1c level is 6.5% or higher.  Your OGTT result is higher than 200 mg/dL (11.1 mmol/L). These blood tests may be repeated to confirm your diagnosis. How is this treated? Your treatment may be managed by a specialist called an endocrinologist. Type 2 diabetes may be treated by following instructions from your health care provider about:  Making diet and lifestyle changes. This may include: ? Following an individualized nutrition plan that is developed by a diet and nutrition specialist (registered dietitian). ? Exercising regularly. ? Finding ways to manage stress.  Checking your blood glucose level as often as told.  Taking diabetes medicines or insulin daily. This helps to keep your blood glucose levels in the healthy range. ? If you use insulin, you may need to adjust the dosage depending on how physically active you are and what foods you eat. Your health care provider will tell you how to adjust your dosage.    Taking medicines to help prevent complications from diabetes, such as: ? Aspirin. ? Medicine to lower cholesterol. ? Medicine to control blood pressure. Your health care provider will set individualized treatment goals for you. Your goals will be based on  your age, other medical conditions you have, and how you respond to diabetes treatment. Generally, the goal of treatment is to maintain the following blood glucose levels:  Before meals (preprandial): 80-130 mg/dL (4.4-7.2 mmol/L).  After meals (postprandial): below 180 mg/dL (10 mmol/L).  A1c level: less than 7%. Follow these instructions at home: Questions to ask your health care provider  Consider asking the following questions: ? Do I need to meet with a diabetes educator? ? Where can I find a support group for people with diabetes? ? What equipment will I need to manage my diabetes at home? ? What diabetes medicines do I need, and when should I take them? ? How often do I need to check my blood glucose? ? What number can I call if I have questions? ? When is my next appointment? General instructions  Take over-the-counter and prescription medicines only as told by your health care provider.  Keep all follow-up visits as told by your health care provider. This is important.  For more information about diabetes, visit: ? American Diabetes Association (ADA): www.diabetes.org ? American Association of Diabetes Educators (AADE): www.diabeteseducator.org Contact a health care provider if:  Your blood glucose is at or above 240 mg/dL (13.3 mmol/L) for 2 days in a row.  You have been sick or have had a fever for 2 days or longer, and you are not getting better.  You have any of the following problems for more than 6 hours: ? You cannot eat or drink. ? You have nausea and vomiting. ? You have diarrhea. Get help right away if:  Your blood glucose is lower than 54 mg/dL (3.0 mmol/L).  You become confused or you have trouble thinking clearly.  You have difficulty breathing.  You have moderate or large ketone levels in your urine. Summary  Type 2 diabetes (type 2 diabetes mellitus) is a long-term (chronic) disease. In type 2 diabetes, the pancreas does not make enough of a  hormone called insulin, or cells in the body do not respond properly to insulin that the body makes (insulin resistance).  This condition is treated by making diet and lifestyle changes and taking diabetes medicines or insulin.  Your health care provider will set individualized treatment goals for you. Your goals will be based on your age, other medical conditions you have, and how you respond to diabetes treatment.  Keep all follow-up visits as told by your health care provider. This is important. This information is not intended to replace advice given to you by your health care provider. Make sure you discuss any questions you have with your health care provider. Document Released: 09/29/2005 Document Revised: 11/27/2017 Document Reviewed: 11/02/2015 Elsevier Patient Education  2020 [employer]  

## 2019-04-13 LAB — HEPATITIS C ANTIBODY
Hepatitis C Ab: NONREACTIVE
SIGNAL TO CUT-OFF: 0.01 (ref <1.00)

## 2019-04-15 MED ORDER — IRBESARTAN 300 MG PO TABS: 300.0000 mg | ORAL_TABLET | Freq: Every day | ORAL | 1 refills | Status: DC

## 2019-04-18 NOTE — Addendum Note (Signed)
Addended by: Aviva Signs M on: 04/18/2019 09:05 AM   Modules accepted: Orders

## 2019-05-03 ENCOUNTER — Telehealth: Payer: Self-pay | Admitting: Internal Medicine

## 2019-05-03 ENCOUNTER — Other Ambulatory Visit: Payer: Self-pay | Admitting: Internal Medicine

## 2019-05-03 DIAGNOSIS — IMO0002 Reserved for concepts with insufficient information to code with codable children: Secondary | ICD-10-CM

## 2019-05-03 DIAGNOSIS — E1149 Type 2 diabetes mellitus with other diabetic neurological complication: Secondary | ICD-10-CM

## 2019-05-03 DIAGNOSIS — E1165 Type 2 diabetes mellitus with hyperglycemia: Secondary | ICD-10-CM

## 2019-05-03 MED ORDER — METFORMIN HCL 1000 MG PO TABS: 1000.0000 mg | ORAL_TABLET | Freq: Two times a day (BID) | ORAL | 1 refills | Status: DC

## 2019-05-03 NOTE — Telephone Encounter (Signed)
metFORMIN (GLUCOPHAGE) 1000 MG tablet  [id number] - [place], [place] AT [place] [phone] (Phone) [phone] (Fax)   Pt needs a 90 day supply

## 2019-06-11 ENCOUNTER — Other Ambulatory Visit: Payer: Self-pay | Admitting: Internal Medicine

## 2019-06-11 DIAGNOSIS — E1149 Type 2 diabetes mellitus with other diabetic neurological complication: Secondary | ICD-10-CM

## 2019-06-11 DIAGNOSIS — E1165 Type 2 diabetes mellitus with hyperglycemia: Secondary | ICD-10-CM

## 2019-06-11 DIAGNOSIS — IMO0002 Reserved for concepts with insufficient information to code with codable children: Secondary | ICD-10-CM

## 2019-07-08 ENCOUNTER — Other Ambulatory Visit: Payer: Self-pay

## 2019-07-08 ENCOUNTER — Ambulatory Visit (INDEPENDENT_AMBULATORY_CARE_PROVIDER_SITE_OTHER): Payer: PPO

## 2019-07-08 DIAGNOSIS — Z23 Encounter for immunization: Secondary | ICD-10-CM

## 2019-10-12 ENCOUNTER — Ambulatory Visit: Payer: PPO | Admitting: Internal Medicine

## 2019-10-31 ENCOUNTER — Ambulatory Visit (INDEPENDENT_AMBULATORY_CARE_PROVIDER_SITE_OTHER): Payer: PPO | Admitting: Internal Medicine

## 2019-10-31 ENCOUNTER — Encounter: Payer: Self-pay | Admitting: Internal Medicine

## 2019-10-31 ENCOUNTER — Other Ambulatory Visit: Payer: Self-pay

## 2019-10-31 VITALS — BP 148/92 | HR 89 | Temp 98.2°F | Resp 16 | Ht 63.5 in | Wt 210.0 lb

## 2019-10-31 DIAGNOSIS — E1165 Type 2 diabetes mellitus with hyperglycemia: Secondary | ICD-10-CM

## 2019-10-31 DIAGNOSIS — Z1231 Encounter for screening mammogram for malignant neoplasm of breast: Secondary | ICD-10-CM

## 2019-10-31 DIAGNOSIS — E559 Vitamin D deficiency, unspecified: Secondary | ICD-10-CM

## 2019-10-31 DIAGNOSIS — E785 Hyperlipidemia, unspecified: Secondary | ICD-10-CM

## 2019-10-31 DIAGNOSIS — IMO0002 Reserved for concepts with insufficient information to code with codable children: Secondary | ICD-10-CM

## 2019-10-31 DIAGNOSIS — I10 Essential (primary) hypertension: Secondary | ICD-10-CM

## 2019-10-31 DIAGNOSIS — Z23 Encounter for immunization: Secondary | ICD-10-CM

## 2019-10-31 DIAGNOSIS — E781 Pure hyperglyceridemia: Secondary | ICD-10-CM | POA: Diagnosis not present

## 2019-10-31 DIAGNOSIS — E1149 Type 2 diabetes mellitus with other diabetic neurological complication: Secondary | ICD-10-CM

## 2019-10-31 LAB — POCT GLYCOSYLATED HEMOGLOBIN (HGB A1C): Hemoglobin A1C: 6.5 % — AB (ref 4.0–5.6)

## 2019-10-31 MED ORDER — TRULICITY 3 MG/0.5ML ~~LOC~~ SOAJ: 1.0000 | SUBCUTANEOUS | 0 refills | Status: DC

## 2019-10-31 MED ORDER — METFORMIN HCL 1000 MG PO TABS: 1000.0000 mg | ORAL_TABLET | Freq: Two times a day (BID) | ORAL | 1 refills | Status: DC

## 2019-10-31 MED ORDER — TRULICITY 0.75 MG/0.5ML ~~LOC~~ SOAJ: 0.7500 mg | SUBCUTANEOUS | 0 refills | Status: DC

## 2019-10-31 MED ORDER — ICOSAPENT ETHYL 1 G PO CAPS: 2.0000 g | ORAL_CAPSULE | Freq: Two times a day (BID) | ORAL | 1 refills | Status: DC

## 2019-10-31 MED ORDER — IRBESARTAN 300 MG PO TABS: 300.0000 mg | ORAL_TABLET | Freq: Every day | ORAL | 1 refills | Status: DC

## 2019-10-31 MED ORDER — TRULICITY 1.5 MG/0.5ML ~~LOC~~ SOAJ: 1.5000 mg | SUBCUTANEOUS | 0 refills | Status: DC

## 2019-10-31 MED ORDER — ATORVASTATIN CALCIUM 20 MG PO TABS: 20.0000 mg | ORAL_TABLET | Freq: Every day | ORAL | 1 refills | Status: DC

## 2019-10-31 NOTE — Patient Instructions (Signed)

## 2019-10-31 NOTE — Progress Notes (Signed)
Subjective:  Patient ID: Abigail Collins, female    DOB: May 01, 1951  Age: 69 y.o. MRN: [id number]  CC: Hypertension and Diabetes  This visit occurred during the SARS-CoV-2 public health emergency.  Safety protocols were in place, including screening questions prior to the visit, additional usage of staff PPE, and extensive cleaning of exam room while observing appropriate contact time as indicated for disinfecting solutions.    HPI Abigail Collins presents for f/up - She stopped using Ozempic several weeks ago and since then complains of weight gain.  According to prescription refills she would have run out of her ARB several months ago.  She complains that her blood pressure is not well controlled and she has had a few headaches recently.  She denies blurred vision, chest pain, shortness of breath, edema, or fatigue.  Outpatient Medications Prior to Visit  Medication Sig Dispense Refill  . albuterol (PROVENTIL) (2.5 MG/3ML) 0.083% nebulizer solution Inhale 2.5 mg into the lungs every 6 (six) hours as needed for wheezing or shortness of breath. Reported on 01/17/2016    . Blood Glucose Monitoring Suppl (ONETOUCH VERIO IQ SYSTEM) W/DEVICE KIT 1 Act by Does not apply route 3 (three) times daily. 2 kit 0  . Insulin Pen Needle (NOVOFINE) 32G X 6 MM MISC 1 Act by Does not apply route daily. 100 each 3  . mirabegron ER (MYRBETRIQ) 50 MG TB24 tablet Take 1 tablet (50 mg total) by mouth daily. 90 tablet 1  . ONETOUCH VERIO test strip TEST THREE TIMES DAILY 200 strip 2  . atorvastatin (LIPITOR) 20 MG tablet Take 1 tablet (20 mg total) by mouth daily. 90 tablet 1  . Icosapent Ethyl (VASCEPA) 1 g CAPS Take 2 capsules (2 g total) by mouth 2 (two) times daily. 360 capsule 1  . irbesartan (AVAPRO) 300 MG tablet Take 1 tablet (300 mg total) by mouth daily. 90 tablet 1  . metFORMIN (GLUCOPHAGE) 1000 MG tablet Take 1 tablet (1,000 mg total) by mouth 2 (two) times daily with a meal. 180 tablet 1  .  Semaglutide,0.25 or 0.5MG/DOS, (OZEMPIC, 0.25 OR 0.5 MG/DOSE,) 2 MG/1.5ML SOPN Inject 1 mg into the skin once a week. 1.5 mL 5   No facility-administered medications prior to visit.    ROS Review of Systems  Constitutional: Positive for unexpected weight change (wt gain). Negative for diaphoresis and fatigue.  HENT: Negative.   Eyes: Negative for visual disturbance.  Respiratory: Negative.  Negative for cough, chest tightness, shortness of breath and wheezing.   Cardiovascular: Negative for chest pain, palpitations and leg swelling.  Gastrointestinal: Negative for abdominal pain, constipation, diarrhea, nausea and vomiting.  Endocrine: Negative for polydipsia, polyphagia and polyuria.  Genitourinary: Negative.  Negative for difficulty urinating, dysuria and hematuria.  Musculoskeletal: Positive for arthralgias. Negative for myalgias.  Skin: Negative.   Neurological: Positive for headaches. Negative for dizziness, weakness and light-headedness.  Hematological: Negative for adenopathy. Does not bruise/bleed easily.  Psychiatric/Behavioral: Negative.     Objective:  BP (!) 148/92 (BP Location: Left Arm, Patient Position: Sitting, Cuff Size: Large)   Pulse 89   Temp 98.2 F (36.8 C) (Oral)   Resp 16   Ht 5' 3.5" (1.613 m)   Wt 210 lb (95.3 kg)   SpO2 94%   BMI 36.62 kg/m   BP Readings from Last 3 Encounters:  10/31/19 (!) 148/92  04/12/19 (!) 144/86  11/25/18 120/80    Wt Readings from Last 3 Encounters:  10/31/19 210 lb (95.3 kg)  04/12/19 207 lb (93.9 kg)  11/25/18 204 lb (92.5 kg)    Physical Exam Vitals reviewed.  Constitutional:      Appearance: She is obese.  HENT:     Nose: Nose normal.     Mouth/Throat:     Mouth: Mucous membranes are moist.  Eyes:     General: No scleral icterus.    Conjunctiva/sclera: Conjunctivae normal.  Cardiovascular:     Rate and Rhythm: Normal rate and regular rhythm.     Heart sounds: No murmur.  Pulmonary:     Effort:  Pulmonary effort is normal.     Breath sounds: No stridor. No wheezing, rhonchi or rales.  Abdominal:     General: Abdomen is protuberant. Bowel sounds are normal. There is no distension.     Palpations: Abdomen is soft. There is no hepatomegaly or splenomegaly.     Tenderness: There is no abdominal tenderness.  Musculoskeletal:        General: Normal range of motion.     Cervical back: Neck supple.     Right lower leg: No edema.     Left lower leg: No edema.  Lymphadenopathy:     Cervical: No cervical adenopathy.  Skin:    General: Skin is warm and dry.  Neurological:     General: No focal deficit present.     Mental Status: She is alert.  Psychiatric:        Mood and Affect: Mood normal.        Behavior: Behavior normal.     Lab Results  Component Value Date   WBC 8.8 04/12/2019   HGB 14.3 04/12/2019   HCT 42.5 04/12/2019   PLT 205.0 04/12/2019   GLUCOSE 103 (H) 04/12/2019   CHOL 168 04/12/2019   TRIG 274.0 (H) 04/12/2019   HDL 62.10 04/12/2019   LDLDIRECT 73.0 04/12/2019   LDLCALC UNABLE TO CALCULATE IF TRIGLYCERIDE OVER 400 mg/dL 04/06/2012   ALT 18 04/12/2019   AST 21 04/12/2019   NA 142 04/12/2019   K 4.0 04/12/2019   CL 104 04/12/2019   CREATININE 0.72 04/12/2019   BUN 18 04/12/2019   CO2 29 04/12/2019   TSH 1.79 04/12/2019   INR 0.91 07/26/2018   HGBA1C 6.5 (A) 10/31/2019   MICROALBUR 0.9 04/12/2019    VAS [place] LOWER EXTREMITY VENOUS (DVT)  Result Date: 11/30/2018  Lower Venous Study Indications: Pain.  Performing Technologist: Abram Sander RVS  Examination Guidelines: A complete evaluation includes B-mode imaging, spectral Doppler, color Doppler, and power Doppler as needed of all accessible portions of each vessel. Bilateral testing is considered an integral part of a complete examination. Limited examinations for reoccurring indications may be performed as noted.  Right Venous Findings: +---+---------------+---------+-----------+----------+-------+     CompressibilityPhasicitySpontaneityPropertiesSummary +---+---------------+---------+-----------+----------+-------+ CFVFull           Yes      Yes                          +---+---------------+---------+-----------+----------+-------+  Left Venous Findings: +---------+---------------+---------+-----------+----------+--------------+          CompressibilityPhasicitySpontaneityPropertiesSummary        +---------+---------------+---------+-----------+----------+--------------+ CFV      Full           Yes      Yes                                 +---------+---------------+---------+-----------+----------+--------------+ SFJ  Full                                                        +---------+---------------+---------+-----------+----------+--------------+ FV Prox  Full                                                        +---------+---------------+---------+-----------+----------+--------------+ FV Mid   Full                                                        +---------+---------------+---------+-----------+----------+--------------+ FV DistalFull                                                        +---------+---------------+---------+-----------+----------+--------------+ PFV      Full                                                        +---------+---------------+---------+-----------+----------+--------------+ POP      Full           Yes      Yes                                 +---------+---------------+---------+-----------+----------+--------------+ PTV      Full                                                        +---------+---------------+---------+-----------+----------+--------------+ PERO                                                  Not visualized +---------+---------------+---------+-----------+----------+--------------+    Summary: Right: No evidence of common femoral vein obstruction. Left: There is  no evidence of deep vein thrombosis in the lower extremity. No cystic structure found in the popliteal fossa.  *See table(s) above for measurements and observations. Electronically signed by Deitra Mayo MD on 11/30/2018 at 2:49:43 PM.    Final     Assessment & Plan:   Prudie was seen today for hypertension and diabetes.  Diagnoses and all orders for this visit:  HTN (hypertension), benign- Her blood pressure is not adequately well controlled and she is symptomatic.  I have asked her to restart the ARB.  Will recheck her electrolytes and renal function. -     Basic metabolic panel -  irbesartan (AVAPRO) 300 MG tablet; Take 1 tablet (300 mg total) by mouth daily.  Diabetes mellitus with neurological manifestations, uncontrolled ([place])- Her blood sugar is adequately well controlled but she complains of weight gain.  I recommended that she restart a GLP-1 agonist. -     Cancel: Hemoglobin A1c -     POCT glycosylated hemoglobin (Hb A1C) -     Dulaglutide (TRULICITY) 3 IH/4.7QQ SOPN; Inject 1 Act into the skin once a week. -     Ambulatory referral to Ophthalmology -     Dulaglutide (TRULICITY) 5.95 GL/8.7FI SOPN; Inject 0.75 mg into the skin once a week. -     Dulaglutide (TRULICITY) 1.5 EP/3.2RJ SOPN; Inject 1.5 mg into the skin once a week. -     metFORMIN (GLUCOPHAGE) 1000 MG tablet; Take 1 tablet (1,000 mg total) by mouth 2 (two) times daily with a meal. -     irbesartan (AVAPRO) 300 MG tablet; Take 1 tablet (300 mg total) by mouth daily.  Pure hyperglyceridemia- I will monitor her triglyceride levels and will treat if indicated. -     Triglycerides  Vitamin D deficiency disease- I will monitor her vitamin D level and will address if indicated. -     VITAMIN D 25 Hydroxy (Vit-D Deficiency, Fractures)  Need for Tdap vaccination -     Tdap vaccine greater than or equal to 7yo IM  Need for pneumococcal vaccination -     Cancel: Pneumococcal polysaccharide vaccine 23-valent  greater than or equal to 2yo subcutaneous/IM -     Pneumococcal conjugate vaccine 13-valent  Visit for screening mammogram -     MM DIGITAL SCREENING BILATERAL; Future  Pure hypertriglyceridemia -     icosapent Ethyl (VASCEPA) 1 g capsule; Take 2 capsules (2 g total) by mouth 2 (two) times daily.  Hyperlipidemia with target LDL less than 100 -     atorvastatin (LIPITOR) 20 MG tablet; Take 1 tablet (20 mg total) by mouth daily.   I have discontinued [employer] [place]'s Semaglutide(0.25 or 0.5MG/DOS). I have also changed her Vascepa to icosapent Ethyl. Additionally, I am having her start on Trulicity, Trulicity, and Trulicity. Lastly, I am having her maintain her OneTouch Verio IQ System, albuterol, Insulin Pen Needle, mirabegron ER, OneTouch Verio, metFORMIN, irbesartan, and atorvastatin.  Meds ordered this encounter  Medications  . Dulaglutide (TRULICITY) 3 JO/8.4ZY SOPN    Sig: Inject 1 Act into the skin once a week.    Dispense:  8 pen    Refill:  0  . Dulaglutide (TRULICITY) 6.06 TK/1.6WF SOPN    Sig: Inject 0.75 mg into the skin once a week.    Dispense:  2 pen    Refill:  0  . Dulaglutide (TRULICITY) 1.5 UX/3.2TF SOPN    Sig: Inject 1.5 mg into the skin once a week.    Dispense:  4 pen    Refill:  0  . metFORMIN (GLUCOPHAGE) 1000 MG tablet    Sig: Take 1 tablet (1,000 mg total) by mouth 2 (two) times daily with a meal.    Dispense:  180 tablet    Refill:  1  . irbesartan (AVAPRO) 300 MG tablet    Sig: Take 1 tablet (300 mg total) by mouth daily.    Dispense:  90 tablet    Refill:  1  . icosapent Ethyl (VASCEPA) 1 g capsule    Sig: Take 2 capsules (2 g total) by mouth 2 (two) times daily.    Dispense:  360 capsule    Refill:  1  . atorvastatin (LIPITOR) 20 MG tablet    Sig: Take 1 tablet (20 mg total) by mouth daily.    Dispense:  90 tablet    Refill:  1     Follow-up: Return in about 6 months (around 04/29/2020).  Scarlette Calico, MD

## 2019-11-02 LAB — HM DIABETES EYE EXAM

## 2019-11-08 ENCOUNTER — Encounter: Payer: Self-pay | Admitting: Internal Medicine

## 2019-11-25 ENCOUNTER — Telehealth: Payer: Self-pay | Admitting: Internal Medicine

## 2019-11-25 DIAGNOSIS — E1149 Type 2 diabetes mellitus with other diabetic neurological complication: Secondary | ICD-10-CM

## 2019-11-25 DIAGNOSIS — IMO0002 Reserved for concepts with insufficient information to code with codable children: Secondary | ICD-10-CM

## 2019-11-25 DIAGNOSIS — E1165 Type 2 diabetes mellitus with hyperglycemia: Secondary | ICD-10-CM

## 2019-11-25 MED ORDER — METFORMIN HCL 1000 MG PO TABS: 1000.0000 mg | ORAL_TABLET | Freq: Two times a day (BID) | ORAL | 1 refills | Status: DC

## 2019-11-25 NOTE — Telephone Encounter (Signed)
Patient needs refill on the following medications : metFORMIN (GLUCOPHAGE) 1000 MG tablet  irbesartan (AVAPRO) 300 MG tablet ONETOUCH VERIO test strip   Send to pharmacy on file.

## 2019-11-25 NOTE — Telephone Encounter (Signed)
erx sent at requested.

## 2019-11-29 DIAGNOSIS — H35363 Drusen (degenerative) of macula, bilateral: Secondary | ICD-10-CM | POA: Diagnosis not present

## 2019-11-29 DIAGNOSIS — H35373 Puckering of macula, bilateral: Secondary | ICD-10-CM | POA: Diagnosis not present

## 2019-11-29 DIAGNOSIS — H35033 Hypertensive retinopathy, bilateral: Secondary | ICD-10-CM | POA: Diagnosis not present

## 2019-11-29 DIAGNOSIS — E113293 Type 2 diabetes mellitus with mild nonproliferative diabetic retinopathy without macular edema, bilateral: Secondary | ICD-10-CM | POA: Diagnosis not present

## 2019-12-02 ENCOUNTER — Other Ambulatory Visit: Payer: Self-pay | Admitting: Internal Medicine

## 2019-12-02 ENCOUNTER — Telehealth: Payer: Self-pay

## 2019-12-02 DIAGNOSIS — E1149 Type 2 diabetes mellitus with other diabetic neurological complication: Secondary | ICD-10-CM

## 2019-12-02 DIAGNOSIS — E1165 Type 2 diabetes mellitus with hyperglycemia: Secondary | ICD-10-CM

## 2019-12-02 DIAGNOSIS — I10 Essential (primary) hypertension: Secondary | ICD-10-CM

## 2019-12-02 DIAGNOSIS — IMO0002 Reserved for concepts with insufficient information to code with codable children: Secondary | ICD-10-CM

## 2019-12-02 MED ORDER — METFORMIN HCL 1000 MG PO TABS: 1000.0000 mg | ORAL_TABLET | Freq: Two times a day (BID) | ORAL | 1 refills | Status: DC

## 2019-12-02 MED ORDER — IRBESARTAN 300 MG PO TABS: 300.0000 mg | ORAL_TABLET | Freq: Every day | ORAL | 1 refills | Status: DC

## 2019-12-02 NOTE — Telephone Encounter (Signed)
Medication Requested:   irbesartan (AVAPRO) 300 MG tablet metFORMIN (GLUCOPHAGE) 1000 MG tablet   Is medication on med list: Yes  (if no, inform pt they may need an appointment)  Is medication a controled: Yes  (yes = last OV with PCP)  -Controlled Substances: Adderall, Ritalin, oxycodone, hydrocodone, methadone, alprazolam, etc  Last visit with PCP:  10/31/19 - Dr. Ronnald Ramp   Is the OV > than 4 months: (yes = schedule an appt if one is not already made)  Pharmacy (Name, [place]): [facility] on [address]

## 2019-12-02 NOTE — Telephone Encounter (Signed)
Erx sent as requested.  

## 2019-12-11 ENCOUNTER — Other Ambulatory Visit: Payer: Self-pay

## 2019-12-11 ENCOUNTER — Ambulatory Visit: Payer: PPO | Attending: Internal Medicine

## 2019-12-11 DIAGNOSIS — Z23 Encounter for immunization: Secondary | ICD-10-CM | POA: Insufficient documentation

## 2019-12-11 NOTE — Progress Notes (Signed)
   Covid-19 Vaccination Clinic  Name:  Abigail Collins    MRN: [id number] DOB: 1951-02-11  12/11/2019  Ms. Abigail Collins was observed post Covid-19 immunization for 15 minutes without incidence. She was provided with Vaccine Information Sheet and instruction to access the V-Safe system.   Ms. Abigail Collins was instructed to call 911 with any severe reactions post vaccine: Marland Kitchen Difficulty breathing  . Swelling of your face and throat  . A fast heartbeat  . A bad rash all over your body  . Dizziness and weakness    Immunizations Administered    Name Date Dose VIS Date Route   Pfizer COVID-19 Vaccine 12/11/2019  1:50 PM 0.3 mL 09/23/2019 Intramuscular   Manufacturer: [place]   Lot: [id number]   [place]: [id number]

## 2020-01-10 ENCOUNTER — Ambulatory Visit: Payer: PPO | Attending: Internal Medicine

## 2020-01-10 DIAGNOSIS — Z23 Encounter for immunization: Secondary | ICD-10-CM

## 2020-01-10 NOTE — Progress Notes (Signed)
   Covid-19 Vaccination Clinic  Name:  Abigail Collins    MRN: [id number] DOB: 1950-12-15  01/10/2020  [name] was observed post Covid-19 immunization for 15 minutes without incident. She was provided with Vaccine Information Sheet and instruction to access the V-Safe system.   [name] was instructed to call 911 with any severe reactions post vaccine: Marland Kitchen Difficulty breathing  . Swelling of face and throat  . A fast heartbeat  . A bad rash all over body  . Dizziness and weakness   Immunizations Administered    Name Date Dose VIS Date Route   Pfizer COVID-19 Vaccine 01/10/2020  1:53 PM 0.3 mL 09/23/2019 Intramuscular   Manufacturer: [employer]   Lot: [id number]   [place]: [id number]

## 2020-02-02 ENCOUNTER — Ambulatory Visit: Payer: PPO | Admitting: Internal Medicine

## 2020-02-08 ENCOUNTER — Encounter: Payer: Self-pay | Admitting: Internal Medicine

## 2020-02-08 ENCOUNTER — Ambulatory Visit (INDEPENDENT_AMBULATORY_CARE_PROVIDER_SITE_OTHER): Payer: PPO | Admitting: Internal Medicine

## 2020-02-08 ENCOUNTER — Other Ambulatory Visit: Payer: Self-pay

## 2020-02-08 VITALS — BP 138/78 | HR 97 | Temp 98.3°F | Resp 16 | Ht 63.5 in | Wt 218.0 lb

## 2020-02-08 DIAGNOSIS — I10 Essential (primary) hypertension: Secondary | ICD-10-CM | POA: Diagnosis not present

## 2020-02-08 DIAGNOSIS — E781 Pure hyperglyceridemia: Secondary | ICD-10-CM | POA: Diagnosis not present

## 2020-02-08 DIAGNOSIS — E1165 Type 2 diabetes mellitus with hyperglycemia: Secondary | ICD-10-CM | POA: Diagnosis not present

## 2020-02-08 DIAGNOSIS — Z0000 Encounter for general adult medical examination without abnormal findings: Secondary | ICD-10-CM

## 2020-02-08 DIAGNOSIS — E785 Hyperlipidemia, unspecified: Secondary | ICD-10-CM | POA: Diagnosis not present

## 2020-02-08 DIAGNOSIS — E1149 Type 2 diabetes mellitus with other diabetic neurological complication: Secondary | ICD-10-CM

## 2020-02-08 DIAGNOSIS — IMO0002 Reserved for concepts with insufficient information to code with codable children: Secondary | ICD-10-CM

## 2020-02-08 DIAGNOSIS — Z1231 Encounter for screening mammogram for malignant neoplasm of breast: Secondary | ICD-10-CM

## 2020-02-08 MED ORDER — TRULICITY 4.5 MG/0.5ML ~~LOC~~ SOAJ: 4.5000 mg | SUBCUTANEOUS | 0 refills | Status: DC

## 2020-02-08 MED ORDER — ATORVASTATIN CALCIUM 20 MG PO TABS: 20.0000 mg | ORAL_TABLET | Freq: Every day | ORAL | 1 refills | Status: DC

## 2020-02-08 NOTE — Progress Notes (Signed)
Subjective:  Patient ID: Abigail Collins, female    DOB: 1951-01-25  Age: 69 y.o. MRN: [id number]  CC: Hypertension, Hyperlipidemia, and Diabetes  This visit occurred during the SARS-CoV-2 public health emergency.  Safety protocols were in place, including screening questions prior to the visit, additional usage of staff PPE, and extensive cleaning of exam room while observing appropriate contact time as indicated for disinfecting solutions.    HPI Abigail Collins presents for a CPX.  She complains of weight gain and says her blood sugars have not been as well-controlled recently as they were before.  She denies polys.  She is active and denies any recent episodes of chest pain, shortness of breath, palpitations, edema, or fatigue.  Outpatient Medications Prior to Visit  Medication Sig Dispense Refill  . albuterol (PROVENTIL) (2.5 MG/3ML) 0.083% nebulizer solution Inhale 2.5 mg into the lungs every 6 (six) hours as needed for wheezing or shortness of breath. Reported on 01/17/2016    . Blood Glucose Monitoring Suppl (ONETOUCH VERIO IQ SYSTEM) W/DEVICE KIT 1 Act by Does not apply route 3 (three) times daily. 2 kit 0  . icosapent Ethyl (VASCEPA) 1 g capsule Take 2 capsules (2 g total) by mouth 2 (two) times daily. 360 capsule 1  . Insulin Pen Needle (NOVOFINE) 32G X 6 MM MISC 1 Act by Does not apply route daily. 100 each 3  . irbesartan (AVAPRO) 300 MG tablet Take 1 tablet (300 mg total) by mouth daily. 90 tablet 1  . metFORMIN (GLUCOPHAGE) 1000 MG tablet Take 1 tablet (1,000 mg total) by mouth 2 (two) times daily with a meal. 180 tablet 1  . mirabegron ER (MYRBETRIQ) 50 MG TB24 tablet Take 1 tablet (50 mg total) by mouth daily. 90 tablet 1  . ONETOUCH VERIO test strip TEST THREE TIMES DAILY 200 strip 2  . atorvastatin (LIPITOR) 20 MG tablet Take 1 tablet (20 mg total) by mouth daily. 90 tablet 1  . Dulaglutide (TRULICITY) 3 OZ/3.0QM SOPN Inject 1 Act into the skin once a week. 8 pen 0  .  Dulaglutide (TRULICITY) 5.78 IO/9.6EX SOPN Inject 0.75 mg into the skin once a week. 2 pen 0  . Dulaglutide (TRULICITY) 1.5 BM/8.4XL SOPN Inject 1.5 mg into the skin once a week. 4 pen 0   No facility-administered medications prior to visit.    ROS Review of Systems  Constitutional: Positive for unexpected weight change (wt gain). Negative for appetite change, chills, diaphoresis and fatigue.  Eyes: Negative for visual disturbance.  Respiratory: Negative for cough, chest tightness, shortness of breath and wheezing.   Cardiovascular: Negative for chest pain, palpitations and leg swelling.  Gastrointestinal: Negative for abdominal pain, constipation, diarrhea, nausea and vomiting.  Endocrine: Negative.  Negative for polydipsia, polyphagia and polyuria.  Genitourinary: Negative.  Negative for difficulty urinating.  Musculoskeletal: Positive for arthralgias. Negative for myalgias.  Skin: Negative.  Negative for color change and rash.  Neurological: Negative.  Negative for dizziness, weakness and light-headedness.  Hematological: Negative for adenopathy. Does not bruise/bleed easily.  Psychiatric/Behavioral: Negative.     Objective:  BP 138/78 (BP Location: Left Arm, Patient Position: Sitting, Cuff Size: Normal)   Pulse 97   Temp 98.3 F (36.8 C) (Oral)   Resp 16   Ht 5' 3.5" (1.613 m)   Wt 218 lb (98.9 kg)   SpO2 96%   BMI 38.01 kg/m   BP Readings from Last 3 Encounters:  02/08/20 138/78  10/31/19 (!) 148/92  04/12/19 (!) 144/86  Wt Readings from Last 3 Encounters:  02/08/20 218 lb (98.9 kg)  10/31/19 210 lb (95.3 kg)  04/12/19 207 lb (93.9 kg)    Physical Exam Vitals reviewed.  Constitutional:      Appearance: She is obese.  HENT:     Nose: Nose normal.     Mouth/Throat:     Mouth: Mucous membranes are moist.     Pharynx: No oropharyngeal exudate.  Eyes:     General: No scleral icterus.    Conjunctiva/sclera: Conjunctivae normal.  Cardiovascular:     Rate and  Rhythm: Normal rate and regular rhythm.     Heart sounds: No murmur.  Pulmonary:     Effort: Pulmonary effort is normal.     Breath sounds: No stridor. No wheezing, rhonchi or rales.  Abdominal:     General: Abdomen is protuberant. Bowel sounds are normal. There is no distension.     Palpations: Abdomen is soft. There is no hepatomegaly, splenomegaly or mass.     Tenderness: There is no abdominal tenderness.  Musculoskeletal:        General: Normal range of motion.     Cervical back: Neck supple.     Right lower leg: No edema.     Left lower leg: No edema.  Lymphadenopathy:     Cervical: No cervical adenopathy.  Skin:    General: Skin is warm and dry.     Coloration: Skin is not pale.  Neurological:     General: No focal deficit present.     Mental Status: She is alert.  Psychiatric:        Mood and Affect: Mood normal.        Behavior: Behavior normal.     Lab Results  Component Value Date   WBC 8.8 04/12/2019   HGB 14.3 04/12/2019   HCT 42.5 04/12/2019   PLT 205.0 04/12/2019   GLUCOSE 116 (H) 02/08/2020   CHOL 177 02/08/2020   TRIG 259.0 (H) 02/08/2020   HDL 58.30 02/08/2020   LDLDIRECT 82.0 02/08/2020   LDLCALC UNABLE TO CALCULATE IF TRIGLYCERIDE OVER 400 mg/dL 04/06/2012   ALT 18 04/12/2019   AST 21 04/12/2019   NA 140 02/08/2020   K 4.1 02/08/2020   CL 104 02/08/2020   CREATININE 0.82 02/08/2020   BUN 20 02/08/2020   CO2 24 02/08/2020   TSH 1.79 04/12/2019   INR 0.91 07/26/2018   HGBA1C 7.4 (H) 02/08/2020   MICROALBUR 0.9 04/12/2019    VAS [place] LOWER EXTREMITY VENOUS (DVT)  Result Date: 11/30/2018  Lower Venous Study Indications: Pain.  Performing Technologist: Abram Sander RVS  Examination Guidelines: A complete evaluation includes B-mode imaging, spectral Doppler, color Doppler, and power Doppler as needed of all accessible portions of each vessel. Bilateral testing is considered an integral part of a complete examination. Limited examinations for  reoccurring indications may be performed as noted.  Right Venous Findings: +---+---------------+---------+-----------+----------+-------+    CompressibilityPhasicitySpontaneityPropertiesSummary +---+---------------+---------+-----------+----------+-------+ CFVFull           Yes      Yes                          +---+---------------+---------+-----------+----------+-------+  Left Venous Findings: +---------+---------------+---------+-----------+----------+--------------+          CompressibilityPhasicitySpontaneityPropertiesSummary        +---------+---------------+---------+-----------+----------+--------------+ CFV      Full           Yes      Yes                                 +---------+---------------+---------+-----------+----------+--------------+  SFJ      Full                                                        +---------+---------------+---------+-----------+----------+--------------+ FV Prox  Full                                                        +---------+---------------+---------+-----------+----------+--------------+ FV Mid   Full                                                        +---------+---------------+---------+-----------+----------+--------------+ FV DistalFull                                                        +---------+---------------+---------+-----------+----------+--------------+ PFV      Full                                                        +---------+---------------+---------+-----------+----------+--------------+ POP      Full           Yes      Yes                                 +---------+---------------+---------+-----------+----------+--------------+ PTV      Full                                                        +---------+---------------+---------+-----------+----------+--------------+ PERO                                                  Not visualized  +---------+---------------+---------+-----------+----------+--------------+    Summary: Right: No evidence of common femoral vein obstruction. Left: There is no evidence of deep vein thrombosis in the lower extremity. No cystic structure found in the popliteal fossa.  *See table(s) above for measurements and observations. Electronically signed by Deitra Mayo MD on 11/30/2018 at 2:49:43 PM.    Final     Assessment & Plan:   Abigail Collins was seen today for hypertension, hyperlipidemia and diabetes.  Diagnoses and all orders for this visit:  HTN (hypertension), benign- Her blood pressure is adequately well controlled.  Electrolytes and renal function are normal. -     Basic metabolic panel; Future -     Basic metabolic panel  Diabetes mellitus with  neurological manifestations, uncontrolled ([place])- Her A1c is up to 7.4%.  She will improve her lifestyle modifications.  Will increase the dose of the GLP-1 agonist and will continue the current dose of Metformin. -     Basic metabolic panel; Future -     Hemoglobin A1c; Future -     Dulaglutide (TRULICITY) 4.5 YT/4.6IT SOPN; Inject 4.5 mg as directed once a week. -     Hemoglobin A1c -     Basic metabolic panel  Hyperlipidemia with target LDL less than 100- She has achieved her LDL goal and is doing well on the statin. -     Lipid panel; Future -     atorvastatin (LIPITOR) 20 MG tablet; Take 1 tablet (20 mg total) by mouth daily. -     Lipid panel  Pure hyperglyceridemia- Her triglycerides remain mildly elevated.  She was encouraged to improve compliance with icosapent ethyl and to improve her lifestyle modifications. -     Lipid panel; Future -     Lipid panel  Routine general medical examination at a health care facility- Exam completed, labs reviewed, vaccines reviewed and updated, cervical cancer screening is not indicated, colon cancer screening is up-to-date, she is referred for screening mammogram.  Visit for screening mammogram -      MM DIGITAL SCREENING BILATERAL; Future  Other orders -     LDL cholesterol, direct   I have discontinued Neoma Laming D. Shuffler's Trulicity, Trulicity, and Trulicity. I am also having her start on Trulicity. Additionally, I am having her maintain her OneTouch Verio IQ System, albuterol, Insulin Pen Needle, mirabegron ER, OneTouch Verio, icosapent Ethyl, irbesartan, metFORMIN, and atorvastatin.  Meds ordered this encounter  Medications  . Dulaglutide (TRULICITY) 4.5 VI/7.1GX SOPN    Sig: Inject 4.5 mg as directed once a week.    Dispense:  8 pen    Refill:  0  . atorvastatin (LIPITOR) 20 MG tablet    Sig: Take 1 tablet (20 mg total) by mouth daily.    Dispense:  90 tablet    Refill:  1     Follow-up: Return in about 6 months (around 08/09/2020).  Scarlette Calico, MD

## 2020-02-08 NOTE — Patient Instructions (Signed)
Health Maintenance, Female Adopting a healthy lifestyle and getting preventive care are important in promoting health and wellness. Ask your health care provider about:  The right schedule for you to have regular tests and exams.  Things you can do on your own to prevent diseases and keep yourself healthy. What should I know about diet, weight, and exercise? Eat a healthy diet   Eat a diet that includes plenty of vegetables, fruits, low-fat dairy products, and lean protein.  Do not eat a lot of foods that are high in solid fats, added sugars, or sodium. Maintain a healthy weight Body mass index (BMI) is used to identify weight problems. It estimates body fat based on height and weight. Your health care provider can help determine your BMI and help you achieve or maintain a healthy weight. Get regular exercise Get regular exercise. This is one of the most important things you can do for your health. Most adults should:  Exercise for at least 150 minutes each week. The exercise should increase your heart rate and make you sweat (moderate-intensity exercise).  Do strengthening exercises at least twice a week. This is in addition to the moderate-intensity exercise.  Spend less time sitting. Even light physical activity can be beneficial. Watch cholesterol and blood lipids Have your blood tested for lipids and cholesterol at 68 years of age, then have this test every 5 years. Have your cholesterol levels checked more often if:  Your lipid or cholesterol levels are high.  You are older than 68 years of age.  You are at high risk for heart disease. What should I know about cancer screening? Depending on your health history and family history, you may need to have cancer screening at various ages. This may include screening for:  Breast cancer.  Cervical cancer.  Colorectal cancer.  Skin cancer.  Lung cancer. What should I know about heart disease, diabetes, and high blood  pressure? Blood pressure and heart disease  High blood pressure causes heart disease and increases the risk of stroke. This is more likely to develop in people who have high blood pressure readings, are of African descent, or are overweight.  Have your blood pressure checked: ? Every 3-5 years if you are 18-39 years of age. ? Every year if you are 40 years old or older. Diabetes Have regular diabetes screenings. This checks your fasting blood sugar level. Have the screening done:  Once every three years after age 40 if you are at a normal weight and have a low risk for diabetes.  More often and at a younger age if you are overweight or have a high risk for diabetes. What should I know about preventing infection? Hepatitis B If you have a higher risk for hepatitis B, you should be screened for this virus. Talk with your health care provider to find out if you are at risk for hepatitis B infection. Hepatitis C Testing is recommended for:  Everyone born from 1945 through 1965.  Anyone with known risk factors for hepatitis C. Sexually transmitted infections (STIs)  Get screened for STIs, including gonorrhea and chlamydia, if: ? You are sexually active and are younger than 68 years of age. ? You are older than 68 years of age and your health care provider tells you that you are at risk for this type of infection. ? Your sexual activity has changed since you were last screened, and you are at increased risk for chlamydia or gonorrhea. Ask your health care provider if   you are at risk.  Ask your health care provider about whether you are at high risk for HIV. Your health care provider may recommend a prescription medicine to help prevent HIV infection. If you choose to take medicine to prevent HIV, you should first get tested for HIV. You should then be tested every 3 months for as long as you are taking the medicine. Pregnancy  If you are about to stop having your period (premenopausal) and  you may become pregnant, seek counseling before you get pregnant.  Take 400 to 800 micrograms (mcg) of folic acid every day if you become pregnant.  Ask for birth control (contraception) if you want to prevent pregnancy. Osteoporosis and menopause Osteoporosis is a disease in which the bones lose minerals and strength with aging. This can result in bone fractures. If you are 65 years old or older, or if you are at risk for osteoporosis and fractures, ask your health care provider if you should:  Be screened for bone loss.  Take a calcium or vitamin D supplement to lower your risk of fractures.  Be given hormone replacement therapy (HRT) to treat symptoms of menopause. Follow these instructions at home: Lifestyle  Do not use any products that contain nicotine or tobacco, such as cigarettes, e-cigarettes, and chewing tobacco. If you need help quitting, ask your health care provider.  Do not use street drugs.  Do not share needles.  Ask your health care provider for help if you need support or information about quitting drugs. Alcohol use  Do not drink alcohol if: ? Your health care provider tells you not to drink. ? You are pregnant, may be pregnant, or are planning to become pregnant.  If you drink alcohol: ? Limit how much you use to 0-1 drink a day. ? Limit intake if you are breastfeeding.  Be aware of how much alcohol is in your drink. In the [place] one drink equals one 12 oz bottle of beer (355 mL), one 5 oz glass of wine (148 mL), or one 1 oz glass of hard liquor (44 mL). General instructions  Schedule regular health, dental, and eye exams.  Stay current with your vaccines.  Tell your health care provider if: ? You often feel depressed. ? You have ever been abused or do not feel safe at home. Summary  Adopting a healthy lifestyle and getting preventive care are important in promoting health and wellness.  Follow your health care provider's instructions about healthy  diet, exercising, and getting tested or screened for diseases.  Follow your health care provider's instructions on monitoring your cholesterol and blood pressure. This information is not intended to replace advice given to you by your health care provider. Make sure you discuss any questions you have with your health care provider. Document Revised: 09/22/2018 Document Reviewed: 09/22/2018 [employer] Patient Education  2020 [employer]  

## 2020-02-09 ENCOUNTER — Encounter: Payer: Self-pay | Admitting: Internal Medicine

## 2020-02-09 LAB — HEMOGLOBIN A1C: Hgb A1c MFr Bld: 7.4 % — ABNORMAL HIGH (ref 4.6–6.5)

## 2020-02-09 LAB — LIPID PANEL
Cholesterol: 177 mg/dL (ref 0–200)
HDL: 58.3 mg/dL (ref >39.00)
NonHDL: 118.86
Total CHOL/HDL Ratio: 3
Triglycerides: 259 mg/dL — ABNORMAL HIGH (ref 0.0–149.0)
VLDL: 51.8 mg/dL — ABNORMAL HIGH (ref 0.0–40.0)

## 2020-02-09 LAB — BASIC METABOLIC PANEL
BUN: 20 mg/dL (ref 6–23)
CO2: 24 mEq/L (ref 19–32)
Calcium: 9.7 mg/dL (ref 8.4–10.5)
Chloride: 104 mEq/L (ref 96–112)
Creatinine, Ser: 0.82 mg/dL (ref 0.40–1.20)
GFR: 69.34 mL/min (ref >60.00)
Glucose, Bld: 116 mg/dL — ABNORMAL HIGH (ref 70–99)
Potassium: 4.1 mEq/L (ref 3.5–5.1)
Sodium: 140 mEq/L (ref 135–145)

## 2020-02-09 LAB — LDL CHOLESTEROL, DIRECT: Direct LDL: 82 mg/dL

## 2020-04-10 ENCOUNTER — Ambulatory Visit (INDEPENDENT_AMBULATORY_CARE_PROVIDER_SITE_OTHER): Payer: Medicare Other | Admitting: Internal Medicine

## 2020-04-10 ENCOUNTER — Other Ambulatory Visit: Payer: Self-pay

## 2020-04-10 ENCOUNTER — Encounter: Payer: Self-pay | Admitting: Internal Medicine

## 2020-04-10 VITALS — BP 146/92 | HR 90 | Temp 98.0°F | Resp 16 | Ht 63.5 in | Wt 217.0 lb

## 2020-04-10 DIAGNOSIS — E1165 Type 2 diabetes mellitus with hyperglycemia: Secondary | ICD-10-CM | POA: Diagnosis not present

## 2020-04-10 DIAGNOSIS — IMO0002 Reserved for concepts with insufficient information to code with codable children: Secondary | ICD-10-CM

## 2020-04-10 DIAGNOSIS — I10 Essential (primary) hypertension: Secondary | ICD-10-CM

## 2020-04-10 DIAGNOSIS — E1149 Type 2 diabetes mellitus with other diabetic neurological complication: Secondary | ICD-10-CM

## 2020-04-10 LAB — POCT GLYCOSYLATED HEMOGLOBIN (HGB A1C): Hemoglobin A1C: 6.9 % — AB (ref 4.0–5.6)

## 2020-04-10 MED ORDER — STEGLATRO 15 MG PO TABS: 15.0000 mg | ORAL_TABLET | Freq: Every day | ORAL | 0 refills | Status: AC

## 2020-04-10 MED ORDER — STEGLATRO 5 MG PO TABS: 5.0000 mg | ORAL_TABLET | Freq: Every day | ORAL | 0 refills | Status: AC

## 2020-04-10 MED ORDER — OZEMPIC (1 MG/DOSE) 2 MG/1.5ML ~~LOC~~ SOPN: 1.0000 mg | PEN_INJECTOR | SUBCUTANEOUS | 1 refills | Status: DC

## 2020-04-10 NOTE — Progress Notes (Signed)
Subjective:  Patient ID: Abigail Collins, female    DOB: 10/19/1950  Age: 69 y.o. MRN: [id number]  CC: Hypertension and Diabetes  This visit occurred during the SARS-CoV-2 public health emergency.  Safety protocols were in place, including screening questions prior to the visit, additional usage of staff PPE, and extensive cleaning of exam room while observing appropriate contact time as indicated for disinfecting solutions.    HPI Abigail Collins presents for f/up - She is frustrated with her glycemic agents.  She feels like she should be losing weight and her blood sugar should be better controlled.  She does not monitor her blood pressure.  She is active and denies any recent episodes of headache, blurred vision, chest pain, shortness of breath, palpitations, edema, or fatigue.  Outpatient Medications Prior to Visit  Medication Sig Dispense Refill  . albuterol (PROVENTIL) (2.5 MG/3ML) 0.083% nebulizer solution Inhale 2.5 mg into the lungs every 6 (six) hours as needed for wheezing or shortness of breath. Reported on 01/17/2016    . atorvastatin (LIPITOR) 20 MG tablet Take 1 tablet (20 mg total) by mouth daily. 90 tablet 1  . Blood Glucose Monitoring Suppl (ONETOUCH VERIO IQ SYSTEM) W/DEVICE KIT 1 Act by Does not apply route 3 (three) times daily. 2 kit 0  . icosapent Ethyl (VASCEPA) 1 g capsule Take 2 capsules (2 g total) by mouth 2 (two) times daily. 360 capsule 1  . Insulin Pen Needle (NOVOFINE) 32G X 6 MM MISC 1 Act by Does not apply route daily. 100 each 3  . irbesartan (AVAPRO) 300 MG tablet Take 1 tablet (300 mg total) by mouth daily. 90 tablet 1  . metFORMIN (GLUCOPHAGE) 1000 MG tablet Take 1 tablet (1,000 mg total) by mouth 2 (two) times daily with a meal. 180 tablet 1  . ONETOUCH VERIO test strip TEST THREE TIMES DAILY 200 strip 2  . Dulaglutide (TRULICITY) 4.5 YD/7.4JO SOPN Inject 4.5 mg as directed once a week. 8 pen 0  . mirabegron ER (MYRBETRIQ) 50 MG TB24 tablet Take 1  tablet (50 mg total) by mouth daily. 90 tablet 1   No facility-administered medications prior to visit.    ROS Review of Systems  Constitutional: Negative for diaphoresis, fatigue and unexpected weight change.  HENT: Negative.   Eyes: Negative.   Respiratory: Negative for chest tightness, shortness of breath and wheezing.   Cardiovascular: Negative for chest pain, palpitations and leg swelling.  Gastrointestinal: Negative for abdominal pain, diarrhea, nausea and vomiting.  Endocrine: Negative.  Negative for polydipsia, polyphagia and polyuria.  Genitourinary: Negative.  Negative for difficulty urinating.  Musculoskeletal: Negative.  Negative for arthralgias and myalgias.  Skin: Negative.   Neurological: Negative.  Negative for dizziness, weakness, light-headedness and numbness.  Hematological: Negative for adenopathy. Does not bruise/bleed easily.  Psychiatric/Behavioral: Negative.     Objective:  BP (!) 146/92 (BP Location: Left Arm, Patient Position: Sitting, Cuff Size: Normal)   Pulse 90   Temp 98 F (36.7 C) (Oral)   Resp 16   Ht 5' 3.5" (1.613 m)   Wt 217 lb (98.4 kg)   SpO2 96%   BMI 37.84 kg/m   BP Readings from Last 3 Encounters:  04/10/20 (!) 146/92  02/08/20 138/78  10/31/19 (!) 148/92    Wt Readings from Last 3 Encounters:  04/10/20 217 lb (98.4 kg)  02/08/20 218 lb (98.9 kg)  10/31/19 210 lb (95.3 kg)    Physical Exam Constitutional:      Appearance: She  is obese.  HENT:     Nose: Nose normal.     Mouth/Throat:     Mouth: Mucous membranes are moist.  Eyes:     General: No scleral icterus.    Conjunctiva/sclera: Conjunctivae normal.  Cardiovascular:     Rate and Rhythm: Normal rate and regular rhythm.     Heart sounds: No murmur heard.   Pulmonary:     Effort: Pulmonary effort is normal.     Breath sounds: No stridor. No wheezing, rhonchi or rales.  Abdominal:     General: Abdomen is protuberant. Bowel sounds are normal. There is no  distension.     Palpations: Abdomen is soft. There is no hepatomegaly, splenomegaly or mass.     Tenderness: There is no abdominal tenderness.  Musculoskeletal:        General: Normal range of motion.     Cervical back: Neck supple.     Right lower leg: No edema.     Left lower leg: No edema.  Lymphadenopathy:     Cervical: No cervical adenopathy.  Skin:    General: Skin is warm and dry.  Neurological:     General: No focal deficit present.     Mental Status: She is alert.  Psychiatric:        Mood and Affect: Mood normal.        Behavior: Behavior normal.     Lab Results  Component Value Date   WBC 8.8 04/12/2019   HGB 14.3 04/12/2019   HCT 42.5 04/12/2019   PLT 205.0 04/12/2019   GLUCOSE 116 (H) 04/10/2020   CHOL 177 02/08/2020   TRIG 259.0 (H) 02/08/2020   HDL 58.30 02/08/2020   LDLDIRECT 82.0 02/08/2020   LDLCALC UNABLE TO CALCULATE IF TRIGLYCERIDE OVER 400 mg/dL 04/06/2012   ALT 18 04/12/2019   AST 21 04/12/2019   NA 141 04/10/2020   K 3.9 04/10/2020   CL 102 04/10/2020   CREATININE 0.81 04/10/2020   BUN 20 04/10/2020   CO2 29 04/10/2020   TSH 1.79 04/12/2019   INR 0.91 07/26/2018   HGBA1C 6.9 (A) 04/10/2020   MICROALBUR 1.9 04/10/2020    VAS [place] LOWER EXTREMITY VENOUS (DVT)  Result Date: 11/30/2018  Lower Venous Study Indications: Pain.  Performing Technologist: Abram Sander RVS  Examination Guidelines: A complete evaluation includes B-mode imaging, spectral Doppler, color Doppler, and power Doppler as needed of all accessible portions of each vessel. Bilateral testing is considered an integral part of a complete examination. Limited examinations for reoccurring indications may be performed as noted.  Right Venous Findings: +---+---------------+---------+-----------+----------+-------+    CompressibilityPhasicitySpontaneityPropertiesSummary +---+---------------+---------+-----------+----------+-------+ CFVFull           Yes      Yes                           +---+---------------+---------+-----------+----------+-------+  Left Venous Findings: +---------+---------------+---------+-----------+----------+--------------+          CompressibilityPhasicitySpontaneityPropertiesSummary        +---------+---------------+---------+-----------+----------+--------------+ CFV      Full           Yes      Yes                                 +---------+---------------+---------+-----------+----------+--------------+ SFJ      Full                                                        +---------+---------------+---------+-----------+----------+--------------+  FV Prox  Full                                                        +---------+---------------+---------+-----------+----------+--------------+ FV Mid   Full                                                        +---------+---------------+---------+-----------+----------+--------------+ FV DistalFull                                                        +---------+---------------+---------+-----------+----------+--------------+ PFV      Full                                                        +---------+---------------+---------+-----------+----------+--------------+ POP      Full           Yes      Yes                                 +---------+---------------+---------+-----------+----------+--------------+ PTV      Full                                                        +---------+---------------+---------+-----------+----------+--------------+ PERO                                                  Not visualized +---------+---------------+---------+-----------+----------+--------------+    Summary: Right: No evidence of common femoral vein obstruction. Left: There is no evidence of deep vein thrombosis in the lower extremity. No cystic structure found in the popliteal fossa.  *See table(s) above for measurements and observations.  Electronically signed by Deitra Mayo MD on 11/30/2018 at 2:49:43 PM.    Final     Assessment & Plan:   Fredrika was seen today for hypertension and diabetes.  Diagnoses and all orders for this visit:  HTN (hypertension), benign- Herblood pressure is not adequately well controlled.  She agrees to improve her lifestyle modifications.  Will add an SGLT2 inhibitor - I anticipate this will help her achieve her blood pressure goal of 130/80. -     Basic metabolic panel; Future -     Urinalysis, Routine w reflex microscopic; Future -     Urinalysis, Routine w reflex microscopic -     Basic metabolic panel  Diabetes mellitus with neurological manifestations, uncontrolled ([place])- Her blood sugars are adequately well controlled.  Will upgrade her to a more potent GLP-1 agonist and  will add an SGLT2 inhibitor to help her lose weight and for cardiovascular risk reduction. -     Basic metabolic panel; Future -     Microalbumin / creatinine urine ratio; Future -     Urinalysis, Routine w reflex microscopic; Future -     Semaglutide, 1 MG/DOSE, (OZEMPIC, 1 MG/DOSE,) 2 MG/1.5ML SOPN; Inject 0.75 mLs (1 mg total) into the skin once a week. -     ertugliflozin L-PyroglutamicAc (STEGLATRO) 5 MG TABS tablet; Take 1 tablet (5 mg total) by mouth daily for 14 days. -     ertugliflozin L-PyroglutamicAc (STEGLATRO) 15 MG TABS tablet; Take 1 tablet (15 mg total) by mouth daily. -     HM Diabetes Foot Exam -     Urinalysis, Routine w reflex microscopic -     Microalbumin / creatinine urine ratio -     Basic metabolic panel -     POCT glycosylated hemoglobin (Hb A1C)   I have discontinued Neoma Laming D. Goldwasser's mirabegron ER and Trulicity. I am also having her start on Ozempic (1 MG/DOSE), Steglatro, and Steglatro. Additionally, I am having her maintain her OneTouch Verio IQ System, albuterol, Insulin Pen Needle, OneTouch Verio, icosapent Ethyl, irbesartan, metFORMIN, and atorvastatin.  Meds ordered this  encounter  Medications  . Semaglutide, 1 MG/DOSE, (OZEMPIC, 1 MG/DOSE,) 2 MG/1.5ML SOPN    Sig: Inject 0.75 mLs (1 mg total) into the skin once a week.    Dispense:  12 pen    Refill:  1  . ertugliflozin L-PyroglutamicAc (STEGLATRO) 5 MG TABS tablet    Sig: Take 1 tablet (5 mg total) by mouth daily for 14 days.    Dispense:  14 tablet    Refill:  0  . ertugliflozin L-PyroglutamicAc (STEGLATRO) 15 MG TABS tablet    Sig: Take 1 tablet (15 mg total) by mouth daily.    Dispense:  28 tablet    Refill:  0     Follow-up: Return in about 6 months (around 10/10/2020).  Scarlette Calico, MD

## 2020-04-10 NOTE — Patient Instructions (Signed)
Type 2 Diabetes Mellitus, Diagnosis, Adult Type 2 diabetes (type 2 diabetes mellitus) is a long-term (chronic) disease. In type 2 diabetes, one or both of these problems may be present:  The pancreas does not make enough of a hormone called insulin.  Cells in the body do not respond properly to insulin that the body makes (insulin resistance). Normally, insulin allows blood sugar (glucose) to enter cells in the body. The cells use glucose for energy. Insulin resistance or lack of insulin causes excess glucose to build up in the blood instead of going into cells. As a result, high blood glucose (hyperglycemia) develops. What increases the risk? The following factors may make you more likely to develop type 2 diabetes:  Having a family member with type 2 diabetes.  Being overweight or obese.  Having an inactive (sedentary) lifestyle.  Having been diagnosed with insulin resistance.  Having a history of prediabetes, gestational diabetes, or polycystic ovary syndrome (PCOS).  Being of American-Indian, African-American, Hispanic/Latino, or Asian/Pacific Islander descent. What are the signs or symptoms? In the early stage of this condition, you may not have symptoms. Symptoms develop slowly and may include:  Increased thirst (polydipsia).  Increased hunger(polyphagia).  Increased urination (polyuria).  Increased urination during the night (nocturia).  Unexplained weight loss.  Frequent infections that keep coming back (recurring).  Fatigue.  Weakness.  Vision changes, such as blurry vision.  Cuts or bruises that are slow to heal.  Tingling or numbness in the hands or feet.  Dark patches on the skin (acanthosis nigricans). How is this diagnosed? This condition is diagnosed based on your symptoms, your medical history, a physical exam, and your blood glucose level. Your blood glucose may be checked with one or more of the following blood tests:  A fasting blood glucose (FBG)  test. You will not be allowed to eat (you will fast) for 8 hours or longer before a blood sample is taken.  A random blood glucose test. This test checks blood glucose at any time of day regardless of when you ate.  An A1c (hemoglobin A1c) blood test. This test provides information about blood glucose control over the previous 2-3 months.  An oral glucose tolerance test (OGTT). This test measures your blood glucose at two times: ? After fasting. This is your baseline blood glucose level. ? Two hours after drinking a beverage that contains glucose. You may be diagnosed with type 2 diabetes if:  Your FBG level is 126 mg/dL (7.0 mmol/L) or higher.  Your random blood glucose level is 200 mg/dL (11.1 mmol/L) or higher.  Your A1c level is 6.5% or higher.  Your OGTT result is higher than 200 mg/dL (11.1 mmol/L). These blood tests may be repeated to confirm your diagnosis. How is this treated? Your treatment may be managed by a specialist called an endocrinologist. Type 2 diabetes may be treated by following instructions from your health care provider about:  Making diet and lifestyle changes. This may include: ? Following an individualized nutrition plan that is developed by a diet and nutrition specialist (registered dietitian). ? Exercising regularly. ? Finding ways to manage stress.  Checking your blood glucose level as often as told.  Taking diabetes medicines or insulin daily. This helps to keep your blood glucose levels in the healthy range. ? If you use insulin, you may need to adjust the dosage depending on how physically active you are and what foods you eat. Your health care provider will tell you how to adjust your dosage.    Taking medicines to help prevent complications from diabetes, such as: ? Aspirin. ? Medicine to lower cholesterol. ? Medicine to control blood pressure. Your health care provider will set individualized treatment goals for you. Your goals will be based on  your age, other medical conditions you have, and how you respond to diabetes treatment. Generally, the goal of treatment is to maintain the following blood glucose levels:  Before meals (preprandial): 80-130 mg/dL (4.4-7.2 mmol/L).  After meals (postprandial): below 180 mg/dL (10 mmol/L).  A1c level: less than 7%. Follow these instructions at home: Questions to ask your health care provider  Consider asking the following questions: ? Do I need to meet with a diabetes educator? ? Where can I find a support group for people with diabetes? ? What equipment will I need to manage my diabetes at home? ? What diabetes medicines do I need, and when should I take them? ? How often do I need to check my blood glucose? ? What number can I call if I have questions? ? When is my next appointment? General instructions  Take over-the-counter and prescription medicines only as told by your health care provider.  Keep all follow-up visits as told by your health care provider. This is important.  For more information about diabetes, visit: ? American Diabetes Association (ADA): www.diabetes.org ? American Association of Diabetes Educators (AADE): www.diabeteseducator.org Contact a health care provider if:  Your blood glucose is at or above 240 mg/dL (13.3 mmol/L) for 2 days in a row.  You have been sick or have had a fever for 2 days or longer, and you are not getting better.  You have any of the following problems for more than 6 hours: ? You cannot eat or drink. ? You have nausea and vomiting. ? You have diarrhea. Get help right away if:  Your blood glucose is lower than 54 mg/dL (3.0 mmol/L).  You become confused or you have trouble thinking clearly.  You have difficulty breathing.  You have moderate or large ketone levels in your urine. Summary  Type 2 diabetes (type 2 diabetes mellitus) is a long-term (chronic) disease. In type 2 diabetes, the pancreas does not make enough of a  hormone called insulin, or cells in the body do not respond properly to insulin that the body makes (insulin resistance).  This condition is treated by making diet and lifestyle changes and taking diabetes medicines or insulin.  Your health care provider will set individualized treatment goals for you. Your goals will be based on your age, other medical conditions you have, and how you respond to diabetes treatment.  Keep all follow-up visits as told by your health care provider. This is important. This information is not intended to replace advice given to you by your health care provider. Make sure you discuss any questions you have with your health care provider. Document Revised: 11/27/2017 Document Reviewed: 11/02/2015 Elsevier Patient Education  2020 Elsevier Inc.  

## 2020-04-11 LAB — BASIC METABOLIC PANEL
BUN: 20 mg/dL (ref 6–23)
CO2: 29 mEq/L (ref 19–32)
Calcium: 9.9 mg/dL (ref 8.4–10.5)
Chloride: 102 mEq/L (ref 96–112)
Creatinine, Ser: 0.81 mg/dL (ref 0.40–1.20)
GFR: 70.29 mL/min (ref >60.00)
Glucose, Bld: 116 mg/dL — ABNORMAL HIGH (ref 70–99)
Potassium: 3.9 mEq/L (ref 3.5–5.1)
Sodium: 141 mEq/L (ref 135–145)

## 2020-04-11 LAB — MICROALBUMIN / CREATININE URINE RATIO
Creatinine,U: 163.7 mg/dL
Microalb Creat Ratio: 1.2 mg/g (ref 0.0–30.0)
Microalb, Ur: 1.9 mg/dL (ref 0.0–1.9)

## 2020-04-11 LAB — URINALYSIS, ROUTINE W REFLEX MICROSCOPIC
Bilirubin Urine: NEGATIVE
Hgb urine dipstick: NEGATIVE
Leukocytes,Ua: NEGATIVE
Nitrite: NEGATIVE
RBC / HPF: NONE SEEN (ref 0–?)
Specific Gravity, Urine: >=1.03 — AB (ref 1.000–1.030)
Total Protein, Urine: NEGATIVE
Urine Glucose: NEGATIVE
Urobilinogen, UA: 0.2 (ref 0.0–1.0)
pH: 5.5 (ref 5.0–8.0)

## 2020-04-18 ENCOUNTER — Other Ambulatory Visit: Payer: Self-pay | Admitting: Internal Medicine

## 2020-04-30 ENCOUNTER — Ambulatory Visit: Payer: PPO | Admitting: Internal Medicine

## 2020-06-21 ENCOUNTER — Telehealth: Payer: Self-pay | Admitting: Internal Medicine

## 2020-06-21 NOTE — Telephone Encounter (Signed)
    Patient calling the office for samples of medication:   1.  What medication and dosage are you requesting samples for? OZEMPIC, 1 MG/DOSE, 4 MG/3ML SOPN  2.  Are you currently out of this medication?  YES

## 2020-06-22 ENCOUNTER — Other Ambulatory Visit: Payer: Self-pay | Admitting: Internal Medicine

## 2020-06-22 DIAGNOSIS — IMO0002 Reserved for concepts with insufficient information to code with codable children: Secondary | ICD-10-CM

## 2020-06-22 DIAGNOSIS — E1149 Type 2 diabetes mellitus with other diabetic neurological complication: Secondary | ICD-10-CM

## 2020-06-22 DIAGNOSIS — E1165 Type 2 diabetes mellitus with hyperglycemia: Secondary | ICD-10-CM

## 2020-06-22 MED ORDER — OZEMPIC (1 MG/DOSE) 4 MG/3ML ~~LOC~~ SOPN: PEN_INJECTOR | SUBCUTANEOUS | 1 refills | Status: DC

## 2020-06-23 ENCOUNTER — Other Ambulatory Visit: Payer: Self-pay | Admitting: Internal Medicine

## 2020-06-23 DIAGNOSIS — I10 Essential (primary) hypertension: Secondary | ICD-10-CM

## 2020-06-23 DIAGNOSIS — E1149 Type 2 diabetes mellitus with other diabetic neurological complication: Secondary | ICD-10-CM

## 2020-06-23 DIAGNOSIS — IMO0002 Reserved for concepts with insufficient information to code with codable children: Secondary | ICD-10-CM

## 2020-06-23 DIAGNOSIS — E1165 Type 2 diabetes mellitus with hyperglycemia: Secondary | ICD-10-CM

## 2020-06-28 NOTE — Telephone Encounter (Signed)
   Patient states she can not afford Semaglutide, 1 MG/DOSE, (OZEMPIC, 1 MG/DOSE,) 4 MG/3ML SOPN Do we have samples?

## 2020-07-06 NOTE — Progress Notes (Signed)
Patient has [employer] insurance and reports copay for Ozempic is cost prohibitive at this time.  Reviewed application process for [employer] patient assistance program. Patient meets income/out of pocket spend criteria for the program. Patient will provide proof of income, out of pocket spend report, and will sign application. Will collaborate with prescriber Dr Ronnald Ramp for the provider portion of application. Once completed, application will be submitted via Fax   Patient assistance program Fax number: [phone]  Abigail Collins, [facility]

## 2020-07-13 ENCOUNTER — Other Ambulatory Visit: Payer: Self-pay | Admitting: Internal Medicine

## 2020-07-13 DIAGNOSIS — E1165 Type 2 diabetes mellitus with hyperglycemia: Secondary | ICD-10-CM

## 2020-07-13 DIAGNOSIS — IMO0002 Reserved for concepts with insufficient information to code with codable children: Secondary | ICD-10-CM

## 2020-07-13 DIAGNOSIS — E1149 Type 2 diabetes mellitus with other diabetic neurological complication: Secondary | ICD-10-CM

## 2020-07-30 ENCOUNTER — Telehealth: Payer: Self-pay | Admitting: Internal Medicine

## 2020-07-30 NOTE — Telephone Encounter (Signed)
   Patient requesting samples of Ozempic

## 2020-07-30 NOTE — Telephone Encounter (Signed)
    Patient wants to know if irbesartan (AVAPRO) 300 MG tablet has been recalled

## 2020-07-31 ENCOUNTER — Other Ambulatory Visit: Payer: Self-pay | Admitting: Internal Medicine

## 2020-07-31 DIAGNOSIS — E1149 Type 2 diabetes mellitus with other diabetic neurological complication: Secondary | ICD-10-CM

## 2020-07-31 DIAGNOSIS — IMO0002 Reserved for concepts with insufficient information to code with codable children: Secondary | ICD-10-CM

## 2020-07-31 DIAGNOSIS — I10 Essential (primary) hypertension: Secondary | ICD-10-CM

## 2020-07-31 DIAGNOSIS — E1165 Type 2 diabetes mellitus with hyperglycemia: Secondary | ICD-10-CM

## 2020-07-31 MED ORDER — AZILSARTAN MEDOXOMIL 40 MG PO TABS: 1.0000 | ORAL_TABLET | Freq: Every day | ORAL | 1 refills | Status: DC

## 2020-08-13 ENCOUNTER — Telehealth: Payer: Self-pay | Admitting: Internal Medicine

## 2020-08-13 NOTE — Telephone Encounter (Signed)
Patient needs prior authorization for all of her medications, patient states [employer] was sending [place] paperwork for [place] to fill out and send back and she wanted to talk to someone about this.  Patient # [phone]

## 2020-08-13 NOTE — Telephone Encounter (Signed)
Called pt, LVM.   I believe pt means she needs refills of medications not prior authorizations. I have LVM to get clarification on the message below.

## 2020-08-15 NOTE — Telephone Encounter (Signed)
Called pt, LVM, stating samples arrived today. Apologized for delayed response.   Located in nurse room fridge.

## 2020-08-17 ENCOUNTER — Other Ambulatory Visit: Payer: Self-pay | Admitting: Internal Medicine

## 2020-08-17 ENCOUNTER — Telehealth: Payer: Self-pay | Admitting: Internal Medicine

## 2020-08-17 DIAGNOSIS — E1149 Type 2 diabetes mellitus with other diabetic neurological complication: Secondary | ICD-10-CM

## 2020-08-17 DIAGNOSIS — I10 Essential (primary) hypertension: Secondary | ICD-10-CM

## 2020-08-17 DIAGNOSIS — E1165 Type 2 diabetes mellitus with hyperglycemia: Secondary | ICD-10-CM

## 2020-08-17 DIAGNOSIS — IMO0002 Reserved for concepts with insufficient information to code with codable children: Secondary | ICD-10-CM

## 2020-08-17 MED ORDER — ONETOUCH VERIO VI STRP: ORAL_STRIP | 2 refills | Status: DC

## 2020-08-17 MED ORDER — IRBESARTAN 150 MG PO TABS: 150.0000 mg | ORAL_TABLET | Freq: Every day | ORAL | 1 refills | Status: DC

## 2020-08-17 NOTE — Telephone Encounter (Signed)
    Patient calling to report Azilsartan Medoxomil 40 MG TABS is too costly ($300 a month), wants to be prescribed Irbesartan.  Patient also requesting refill for [facility] VERIO test strip  Pharmacy:[facility] [phone] - [place], [place]

## 2020-08-29 ENCOUNTER — Encounter: Payer: Self-pay | Admitting: *Deleted

## 2020-08-29 ENCOUNTER — Other Ambulatory Visit: Payer: Self-pay | Admitting: *Deleted

## 2020-08-29 NOTE — Patient Outreach (Signed)
[facility]) Care Management Chronic Special Needs Program  08/29/2020  Name: Abigail Collins DOB: [birth year]  MRN: [id number]  Abigail Collins is enrolled in a chronic special needs plan for Diabetes. Chronic Care Management Coordinator telephoned client to review health risk assessment and to develop individualized care plan.  Introduced the chronic care management program, importance of client participation, and taking their care plan to all provider appointments and inpatient facilities.  Reviewed the transition of care process and possible referral to community care management.  Subjective: Client reports she lives with spouse and is mostly independent with her care but does need "some help from my husband for getting in and out of shower and housekeeping"  Client does not drive, her spouse takes her to all appointments.  Client reports she has lost 50 pounds and "would like to lose 40 more"  Client declines health coaching services.  Client states she plans to call the HTA conciegre about "maybe joining a gym"  Client states she is not taking statin drug because she cannot afford and would like assistance of HTA pharmacist.    Goals Addressed              This Visit's Progress   .   Acknowledge receipt of [employer] mailed client Advanced Directives packet. RN care manager reviewed importance of having Advanced Directives forms completed. Plan to have Advanced Directives (Living Will and POA) forms notarized and witnessed. RN care manager mailed EMMI education article " Advanced Directives"     .  "lose 40 pounds" (pt-stated)        Please talk with your doctor about weight loss RN care manager provided EMMI education article "Weight Loss Tips"    .  Client understands the importance of follow-up with providers by attending scheduled visits        Continue to follow up with primary care provider    .  Client will use  Assistive Devices as needed and verbalize understanding of device use        Continue to use cane as needed Keep pathways clear    .  Client will verbalize knowledge of self management of Hypertension as evidences by BP reading of 140/90 or less; or as defined by provider        Plan to check blood pressure regularly.  If you do not have a B/P monitor (cuff), one can be provided to you.  Write results in your [employer] calendar (in the back section). Reviewed blood pressure medication from EMR. Take B/P medications as ordered.  Some may cause you to use the bathroom more. Plan to eat low salt and heart healthy meals full of fruits, vegetables, whole grains, lean protein and limit fat and sugars. Increase activity as tolerated. Reviewed lifestyle modification- smoking cessation, weight control and reducing stress. EMMI education article provided "Controlling your blood pressure through lifestyle"  Review and plan to discuss with RN during next telephonic assessment.     Marland Kitchen  HEMOGLOBIN A1C < 7        Congratulations, your AIC is below goal at 6.9 Your last documented AIC is on 04/10/20.  Have your [facility] checked every 6 months if you are at goal or every 3 months if you are not at goal. Check blood sugars daily before eating with goal of 80-130.  You can also check 1 1/2 hours after eating with goal of  180 or less. Plan to eat low carbohydrate and low salt meals, watch portion sizes and avoid sugar sweetened drinks.  Discussed carbohydrate control meals. Reviewed signs and symptoms of hyperglycemia (high blood sugar) and hypoglycemia (low blood sugar) and actions to take. Review [employer] calendar (sent in the mail) for diabetes action plan in the back. Reviewed nutrition counseling benefit provided by [employer].   Increase activity only if you are able to do it.  Follow doctor recommendations. EMMI education provided on "Diabetes and Diet".  Review and plan to  discuss with RN during next telephonic assessment.  Use 24 hour nurse advice line as needed at [phone]      .  Maintain timely refills of diabetic medication as prescribed within the year .        Contact your RN care manager if you have questions about medicines. Medication review completed from EMR information. It is important to take your medications as prescribed. Reviewed use and possible side effects of diabetes medications. RN care manager referred client to pharmacist for information and assistance (client reports she cannot afford statin medication)     .  Obtain annual  Lipid Profile, LDL-C   On track     Per medical record review, Lipid profile completed on 02/08/20 LDL= 82 The goal for LDL is less than 70mg /dl as you are at high risk for complications. Try to avoid saturated fats, trans-fats and eat more fiber. Plan to take statin (cholesterol) medicine as ordered.     .  Obtain Annual Eye (retinal)  Exam    On track     Your last documented eye exam was on 11/02/19. Diabetes can affect your vision.  Plan to have a dilated eye exam every year. Advised client to keep and/ or schedule appointment with eye doctor.      .  Obtain Annual Foot Exam   On track     Your doctor should check your bare feet at each visit. Diabetes can affect the nerves in your feet, causing decreased feeling or numbness. Check your feet and in-between toes daily for cuts, bruises, redness, blisters or sores.  If you cannot reach them, use a mirror. Wash feet with soap and water, dry feet well especially between toes.  Don't use too much lotion. Wear shoes that are not too tight and don't walk barefoot.      .  Obtain annual screen for micro albuminuria (urine) , nephropathy (kidney problems)   On track     Diabetes can affect your kidneys. It is important for your doctor to check your urine at least once a year  These tests show how your kidneys are working.     .  Obtain Hemoglobin  A1C at least 2 times per year        Continue to follow up with your doctor and have Hgb AIC checked at least twice yearly    .  Visit Primary Care Provider or Endocrinologist at least 2 times per year         Client saw primary care provider 02/08/20 and 04/10/20.       Plan:    RN care manager faxed today's note with individualized care plan to primary care provider, mailed individualized care plan to client along with successful outreach letter, education materials, consent form, 24 hour nurse line magnet, advanced directives packet, HTA calendar.  Chronic care management coordination will outreach in:  9-12 months  Will refer client to:  Pharmacist for assistance with medication costs   Kassie Mends Nursing/RN Coord [facility] Case Manager, C-SNP  [phone]

## 2020-08-29 NOTE — Patient Outreach (Signed)
Received a Pharmacy referral for a [employer] patient. The Primary Care Physician is using [facility] services.   I have sent an email and an inbasket message referral to: Charlene Brooke with [facility].

## 2020-09-11 ENCOUNTER — Telehealth: Payer: Self-pay | Admitting: Pharmacist

## 2020-09-11 NOTE — Telephone Encounter (Signed)
Received referral from [place] for medication management, patient has reported trouble with affording medication. Left message for patient to return my call.

## 2020-09-13 NOTE — Telephone Encounter (Signed)
LVM inquiring if pt will be able to get samples or if we needed to put back in stock.

## 2020-09-17 NOTE — Telephone Encounter (Signed)
Spoke with patient, informed her Ozempic samples are available for her to pick up -they are located in nurse room fridge.   We will also pursue patient assistance for Ozempic for 2022.   Reviewed application process for [employer] patient assistance program. Patient meets income/out of pocket spend criteria for the program. Patient will provide proof of income, out of pocket spend report, and will sign application. Will collaborate with prescriber Dr Ronnald Ramp for the provider portion of application. Once completed, application will be submitted via Fax   Patient will bring proof of income and sign application when she picks up Ozempic samples.  Charlton Haws, [facility]

## 2020-10-01 ENCOUNTER — Other Ambulatory Visit: Payer: Self-pay | Admitting: *Deleted

## 2020-10-01 NOTE — Patient Outreach (Signed)
  [facility]) Care Management Chronic Special Needs Program    10/01/2020  Name: Abigail Collins, DOB: 18-Oct-1950  MRN: [id number]   Ms. Abigail Collins is enrolled in a chronic special needs plan for Diabetes.  [facility] care management will continue to provide services for this member through 10/12/20.  The [employer] care management team will assume care 10/13/20.   Jacqlyn Larsen [facility], BSN [place], [place]

## 2020-10-11 ENCOUNTER — Ambulatory Visit: Payer: PPO | Admitting: Internal Medicine

## 2020-10-16 ENCOUNTER — Ambulatory Visit: Payer: PPO | Admitting: Internal Medicine

## 2020-10-18 ENCOUNTER — Other Ambulatory Visit: Payer: Self-pay | Admitting: *Deleted

## 2020-12-31 ENCOUNTER — Other Ambulatory Visit: Payer: Self-pay | Admitting: Internal Medicine

## 2020-12-31 ENCOUNTER — Telehealth: Payer: Self-pay | Admitting: Internal Medicine

## 2020-12-31 DIAGNOSIS — I10 Essential (primary) hypertension: Secondary | ICD-10-CM

## 2020-12-31 DIAGNOSIS — E1149 Type 2 diabetes mellitus with other diabetic neurological complication: Secondary | ICD-10-CM

## 2020-12-31 DIAGNOSIS — IMO0002 Reserved for concepts with insufficient information to code with codable children: Secondary | ICD-10-CM

## 2020-12-31 MED ORDER — IRBESARTAN 300 MG PO TABS: 300.0000 mg | ORAL_TABLET | Freq: Every day | ORAL | 0 refills | Status: DC

## 2020-12-31 MED ORDER — IRBESARTAN 150 MG PO TABS: 150.0000 mg | ORAL_TABLET | Freq: Every day | ORAL | 1 refills | Status: DC

## 2020-12-31 MED ORDER — METFORMIN HCL 1000 MG PO TABS: 1000.0000 mg | ORAL_TABLET | Freq: Two times a day (BID) | ORAL | 0 refills | Status: DC

## 2020-12-31 NOTE — Telephone Encounter (Signed)
Patient wondering why the irbesartan (AVAPRO) 150 MG tablet 150 was sent in because she has been taking the 300  She also needs metFORMIN (GLUCOPHAGE) 1000 MG tablet

## 2021-01-01 ENCOUNTER — Other Ambulatory Visit: Payer: Self-pay | Admitting: Internal Medicine

## 2021-01-01 DIAGNOSIS — I10 Essential (primary) hypertension: Secondary | ICD-10-CM

## 2021-01-01 DIAGNOSIS — E1149 Type 2 diabetes mellitus with other diabetic neurological complication: Secondary | ICD-10-CM

## 2021-01-01 DIAGNOSIS — IMO0002 Reserved for concepts with insufficient information to code with codable children: Secondary | ICD-10-CM

## 2021-01-09 ENCOUNTER — Telehealth (INDEPENDENT_AMBULATORY_CARE_PROVIDER_SITE_OTHER): Payer: PPO | Admitting: Family

## 2021-01-09 DIAGNOSIS — J019 Acute sinusitis, unspecified: Secondary | ICD-10-CM | POA: Diagnosis not present

## 2021-01-09 DIAGNOSIS — J209 Acute bronchitis, unspecified: Secondary | ICD-10-CM

## 2021-01-09 MED ORDER — BENZONATATE 100 MG PO CAPS: 100.0000 mg | ORAL_CAPSULE | Freq: Three times a day (TID) | ORAL | 0 refills | Status: DC | PRN

## 2021-01-09 MED ORDER — AMOXICILLIN-POT CLAVULANATE 875-125 MG PO TABS: 1.0000 | ORAL_TABLET | Freq: Two times a day (BID) | ORAL | 0 refills | Status: AC

## 2021-01-09 NOTE — Progress Notes (Signed)
Abigail Collins is a 70 y.o. female with the following history as recorded in EpicCare:  Patient Active Problem List   Diagnosis Date Noted  . Age-related osteoporosis without current pathological fracture 04/12/2019  . OAB (overactive bladder) 04/12/2019  . Vitamin D deficiency disease 05/07/2018  . Excessive daytime sleepiness 05/27/2016  . COPD mixed type ([place]) 01/09/2016  . Visit for screening mammogram 03/28/2015  . Depression with somatization 03/28/2015  . Insomnia 03/28/2015  . Primary osteoarthritis of both knees 03/28/2015  . Allergic rhinitis 03/15/2014  . Hyperlipidemia with target LDL less than 100 12/22/2013  . Routine general medical examination at a health care facility 08/03/2013  . Pure hyperglyceridemia 08/05/2012  . Gastroparesis due to DM ([place]) 08/05/2012  . Diabetes mellitus with neurological manifestations, uncontrolled ([place]) 04/05/2012  . HTN (hypertension), benign 04/05/2012  . GERD (gastroesophageal reflux disease) 04/05/2012  . Morbid obesity ([place]) 04/05/2012  . Primary osteoarthritis of left knee 06/19/2011    Current Outpatient Medications  Medication Sig Dispense Refill  . amoxicillin-clavulanate (AUGMENTIN) 875-125 MG tablet Take 1 tablet by mouth 2 (two) times daily for 10 days. 20 tablet 0  . benzonatate (TESSALON) 100 MG capsule Take 1 capsule (100 mg total) by mouth 3 (three) times daily as needed. 20 capsule 0  . albuterol (PROVENTIL) (2.5 MG/3ML) 0.083% nebulizer solution Inhale 2.5 mg into the lungs every 6 (six) hours as needed for wheezing or shortness of breath. Reported on 01/17/2016 (Patient not taking: Reported on 08/29/2020)    . atorvastatin (LIPITOR) 20 MG tablet Take 1 tablet (20 mg total) by mouth daily. (Patient not taking: Reported on 08/29/2020) 90 tablet 1  . Blood Glucose Monitoring Suppl (ONETOUCH VERIO IQ SYSTEM) W/DEVICE KIT 1 Act by Does not apply route 3 (three) times daily. 2 kit 0  . glucose blood (ONETOUCH VERIO) test strip  TEST THREE TIMES DAILY 200 strip 2  . icosapent Ethyl (VASCEPA) 1 g capsule Take 2 capsules (2 g total) by mouth 2 (two) times daily. (Patient not taking: Reported on 08/29/2020) 360 capsule 1  . Insulin Pen Needle (NOVOFINE) 32G X 6 MM MISC 1 Act by Does not apply route daily. 100 each 3  . irbesartan (AVAPRO) 300 MG tablet TAKE 1 TABLET(300 MG) BY MOUTH DAILY 90 tablet 0  . metFORMIN (GLUCOPHAGE) 1000 MG tablet Take 1 tablet (1,000 mg total) by mouth 2 (two) times daily with a meal. 60 tablet 0  . Semaglutide, 1 MG/DOSE, (OZEMPIC, 1 MG/DOSE,) 4 MG/3ML SOPN INJECT 1 MG SUBCUTANEOUS ONE DAY A WEEK 9 mL 1   No current facility-administered medications for this visit.    Allergies: Lisinopril, Amlodipine, Tape, Morphine and related, and Percocet [oxycodone-acetaminophen]  Past Medical History:  Diagnosis Date  . Anemia    hx of  . Anginal pain ([place])    "left arm pain, sees Dr. Etter Sjogren, had card cath 2013"  . Arthritis    "all over" (09/14/2012)  . Asthma    takes inhaler 2x day  . Bronchitis    hx of  . Bulging disc    "lower"  . Carpal tunnel syndrome of left wrist   . Depression    denies  . Exertional dyspnea    "sometimes laying down" (09/14/2012)  . Headache    frequent headaches,usually if she does not eat  . Hyperlipidemia   . Hypertension    sees Dr. Debby Freiberg, primary  . Pneumonia 03/2012  . PONV (postoperative nausea and vomiting)   . Thyroid  disease 1960's   "don't have it now" (09/14/2012)  . Type II diabetes mellitus ([place])   . Urinary tract infection    hx of  . Vomiting    pt states she vomits every am    Past Surgical History:  Procedure Laterality Date  . CARDIAC CATHETERIZATION  05/13/2012   mod luminal irregularity of pLAD, no sign CAD, EF 65%.  . CHOLECYSTECTOMY  1970's  . KYPHOPLASTY N/A 02/10/2013   Procedure:  LUMBAR TWO KYPHOPLASTY;  Surgeon: Ophelia Charter, MD;  Location: [place] [facility];  Service: Neurosurgery;  Laterality: N/A;  L2 Kyphoplasty;  Will use Stern's Carm.  . ORIF HUMERUS FRACTURE Right 01/06/2018   Procedure: OPEN REDUCTION INTERNAL FIXATION (ORIF) RIGHT PROXIMAL HUMERUS FRACTURE;  Surgeon: Leandrew Koyanagi, MD;  Location: [place];  Service: Orthopedics;  Laterality: Right;  . TOTAL ELBOW REPLACEMENT  ~ 2005   "right" (09/14/2012)  . TOTAL KNEE ARTHROPLASTY  09/13/2012   Procedure: TOTAL KNEE ARTHROPLASTY;  Surgeon: Vickey Huger, MD;  Location: [place];  Service: Orthopedics;  Laterality: Right;  . TOTAL KNEE ARTHROPLASTY Left 08/02/2018   Procedure: LEFT TOTAL KNEE ARTHROPLASTY;  Surgeon: Leandrew Koyanagi, MD;  Location: [place];  Service: Orthopedics;  Laterality: Left;  . TUBAL LIGATION  1970's  . VAGINAL HYSTERECTOMY  1970's    Family History  Problem Relation Age of Onset  . Arthritis Mother   . Arthritis Father   . Hypertension Father   . Diabetes Father   . Colon cancer Neg Hx     Social History   Tobacco Use  . Smoking status: Former Smoker    Packs/day: 0.25    Years: 20.00    Pack years: 5.00    Types: Cigarettes    Quit date: 10/13/2009    Years since quitting: 11.2  . Smokeless tobacco: Never Used  Substance Use Topics  . Alcohol use: No    Comment: 09/14/2012 "did drink a little in my younger days"    Subjective:   I connected with [name] on 01/09/21 at  9:00 AM EDT by a telephone call and verified that I am speaking with the correct person using two identifiers.   I discussed the limitations of evaluation and management by telemedicine and the availability of in person appointments. The patient expressed understanding and agreed to proceed. Provider in office/ patient is at home; provider and patient are only 2 people on telephone call.   10 day history of cough/ congestion; + sinus pain, pressure; + productive cough; feels like congestion is moving into her chest; coughing up "dark thick mucus." No fever; has taken home COVID test which was negative;    Objective:  There were no vitals filed  for this visit.  Lungs: Respirations unlabored;  Neurologic: Alert and oriented; speech intact; face symmetrical;   Assessment:  1. Acute bronchitis, unspecified organism   2. Acute sinusitis, recurrence not specified, unspecified location     Plan:  Rx for Augmentin 875 mg bid x 10 days; Rx for Tessalon Perles 100 mg tid prn; increase fluids, rest and follow up worse, no better; to consider CXR if symptoms persist.  Keep planned follow-up with her PCP for 2 weeks for re-check in office;   Time spent 11 minutes  No follow-ups on file.  No orders of the defined types were placed in this encounter.   Requested Prescriptions   Signed Prescriptions Disp Refills  . amoxicillin-clavulanate (AUGMENTIN) 875-125 MG tablet 20 tablet 0  Sig: Take 1 tablet by mouth 2 (two) times daily for 10 days.  . benzonatate (TESSALON) 100 MG capsule 20 capsule 0    Sig: Take 1 capsule (100 mg total) by mouth 3 (three) times daily as needed.

## 2021-01-10 ENCOUNTER — Ambulatory Visit: Payer: PPO | Admitting: Internal Medicine

## 2021-01-24 ENCOUNTER — Ambulatory Visit (INDEPENDENT_AMBULATORY_CARE_PROVIDER_SITE_OTHER): Payer: HMO | Admitting: Internal Medicine

## 2021-01-24 ENCOUNTER — Encounter: Payer: Self-pay | Admitting: Internal Medicine

## 2021-01-24 ENCOUNTER — Other Ambulatory Visit: Payer: Self-pay

## 2021-01-24 VITALS — BP 160/102 | HR 90 | Temp 98.1°F | Resp 16 | Ht 63.5 in | Wt 217.6 lb

## 2021-01-24 DIAGNOSIS — I10 Essential (primary) hypertension: Secondary | ICD-10-CM

## 2021-01-24 DIAGNOSIS — E781 Pure hyperglyceridemia: Secondary | ICD-10-CM

## 2021-01-24 DIAGNOSIS — Z1231 Encounter for screening mammogram for malignant neoplasm of breast: Secondary | ICD-10-CM

## 2021-01-24 DIAGNOSIS — L989 Disorder of the skin and subcutaneous tissue, unspecified: Secondary | ICD-10-CM | POA: Insufficient documentation

## 2021-01-24 DIAGNOSIS — E1165 Type 2 diabetes mellitus with hyperglycemia: Secondary | ICD-10-CM

## 2021-01-24 DIAGNOSIS — E1149 Type 2 diabetes mellitus with other diabetic neurological complication: Secondary | ICD-10-CM

## 2021-01-24 DIAGNOSIS — E559 Vitamin D deficiency, unspecified: Secondary | ICD-10-CM

## 2021-01-24 DIAGNOSIS — Z0000 Encounter for general adult medical examination without abnormal findings: Secondary | ICD-10-CM

## 2021-01-24 DIAGNOSIS — E785 Hyperlipidemia, unspecified: Secondary | ICD-10-CM

## 2021-01-24 DIAGNOSIS — IMO0002 Reserved for concepts with insufficient information to code with codable children: Secondary | ICD-10-CM

## 2021-01-24 LAB — BASIC METABOLIC PANEL
BUN: 15 mg/dL (ref 6–23)
CO2: 27 mEq/L (ref 19–32)
Calcium: 9.8 mg/dL (ref 8.4–10.5)
Chloride: 105 mEq/L (ref 96–112)
Creatinine, Ser: 0.89 mg/dL (ref 0.40–1.20)
GFR: 66.39 mL/min (ref >60.00)
Glucose, Bld: 118 mg/dL — ABNORMAL HIGH (ref 70–99)
Potassium: 4.1 mEq/L (ref 3.5–5.1)
Sodium: 142 mEq/L (ref 135–145)

## 2021-01-24 LAB — URINALYSIS, ROUTINE W REFLEX MICROSCOPIC
Bilirubin Urine: NEGATIVE
Hgb urine dipstick: NEGATIVE
Ketones, ur: NEGATIVE
Nitrite: NEGATIVE
RBC / HPF: NONE SEEN (ref 0–?)
Specific Gravity, Urine: 1.025 (ref 1.000–1.030)
Total Protein, Urine: NEGATIVE
Urine Glucose: NEGATIVE
Urobilinogen, UA: 0.2 (ref 0.0–1.0)
pH: 5.5 (ref 5.0–8.0)

## 2021-01-24 LAB — LIPID PANEL
Cholesterol: 169 mg/dL (ref 0–200)
HDL: 55.3 mg/dL (ref >39.00)
NonHDL: 113.94
Total CHOL/HDL Ratio: 3
Triglycerides: 219 mg/dL — ABNORMAL HIGH (ref 0.0–149.0)
VLDL: 43.8 mg/dL — ABNORMAL HIGH (ref 0.0–40.0)

## 2021-01-24 LAB — CBC WITH DIFFERENTIAL/PLATELET
Basophils Absolute: 0.1 10*3/uL (ref 0.0–0.1)
Basophils Relative: 1 % (ref 0.0–3.0)
Eosinophils Absolute: 0.1 10*3/uL (ref 0.0–0.7)
Eosinophils Relative: 1.8 % (ref 0.0–5.0)
HCT: 41 % (ref 36.0–46.0)
Hemoglobin: 13.5 g/dL (ref 12.0–15.0)
Lymphocytes Relative: 40.6 % (ref 12.0–46.0)
Lymphs Abs: 2.8 10*3/uL (ref 0.7–4.0)
MCHC: 32.9 g/dL (ref 30.0–36.0)
MCV: 88.6 fl (ref 78.0–100.0)
Monocytes Absolute: 0.4 10*3/uL (ref 0.1–1.0)
Monocytes Relative: 5.6 % (ref 3.0–12.0)
Neutro Abs: 3.5 10*3/uL (ref 1.4–7.7)
Neutrophils Relative %: 51 % (ref 43.0–77.0)
Platelets: 220 10*3/uL (ref 150.0–400.0)
RBC: 4.62 Mil/uL (ref 3.87–5.11)
RDW: 14.4 % (ref 11.5–15.5)
WBC: 6.8 10*3/uL (ref 4.0–10.5)

## 2021-01-24 LAB — HEPATIC FUNCTION PANEL
ALT: 15 U/L (ref 0–35)
AST: 19 U/L (ref 0–37)
Albumin: 4.1 g/dL (ref 3.5–5.2)
Alkaline Phosphatase: 77 U/L (ref 39–117)
Bilirubin, Direct: 0.1 mg/dL (ref 0.0–0.3)
Total Bilirubin: 0.6 mg/dL (ref 0.2–1.2)
Total Protein: 7.5 g/dL (ref 6.0–8.3)

## 2021-01-24 LAB — HEMOGLOBIN A1C: Hgb A1c MFr Bld: 7.6 % — ABNORMAL HIGH (ref 4.6–6.5)

## 2021-01-24 LAB — TSH: TSH: 2.52 u[IU]/mL (ref 0.35–4.50)

## 2021-01-24 LAB — LDL CHOLESTEROL, DIRECT: Direct LDL: 82 mg/dL

## 2021-01-24 LAB — VITAMIN D 25 HYDROXY (VIT D DEFICIENCY, FRACTURES): VITD: 40.65 ng/mL (ref 30.00–100.00)

## 2021-01-24 MED ORDER — OZEMPIC (1 MG/DOSE) 4 MG/3ML ~~LOC~~ SOPN: PEN_INJECTOR | SUBCUTANEOUS | 1 refills | Status: DC

## 2021-01-24 MED ORDER — METFORMIN HCL 1000 MG PO TABS: 1000.0000 mg | ORAL_TABLET | Freq: Two times a day (BID) | ORAL | 1 refills | Status: DC

## 2021-01-24 MED ORDER — INSULIN PEN NEEDLE 32G X 6 MM MISC
1.0000 | Freq: Every day | 3 refills | Status: DC
Start: 2021-01-24 — End: 2021-10-25

## 2021-01-24 MED ORDER — ICOSAPENT ETHYL 1 G PO CAPS: 2.0000 g | ORAL_CAPSULE | Freq: Two times a day (BID) | ORAL | 1 refills | Status: DC

## 2021-01-24 MED ORDER — INDAPAMIDE 1.25 MG PO TABS: 1.2500 mg | ORAL_TABLET | Freq: Every day | ORAL | 0 refills | Status: DC

## 2021-01-24 MED ORDER — ATORVASTATIN CALCIUM 20 MG PO TABS: 20.0000 mg | ORAL_TABLET | Freq: Every day | ORAL | 1 refills | Status: DC

## 2021-01-24 MED ORDER — ONETOUCH VERIO VI STRP: ORAL_STRIP | 2 refills | Status: DC

## 2021-01-24 NOTE — Progress Notes (Signed)
Subjective:  Patient ID: Abigail Collins, female    DOB: 02/12/1951  Age: 70 y.o. MRN: [id number]  CC: Hypertension, Hyperlipidemia, and Diabetes  This visit occurred during the SARS-CoV-2 public health emergency.  Safety protocols were in place, including screening questions prior to the visit, additional usage of staff PPE, and extensive cleaning of exam room while observing appropriate contact time as indicated for disinfecting solutions.    HPI VERSIE SOAVE presents for f/up -   She has been drinking a lot of chicken broth recently.  She has since noticed that she has a little bit of edema in her ankles, feet, and fingers.  She has not been monitoring her blood pressure.  She does not exercise much because she uses a cane.  When she is active she denies chest pain, shortness of breath, palpitations, edema, or fatigue.  She tells me her blood sugars have been hovering around the 150 range.  She is concerned about a crusty lesion in the center of her chest.  Outpatient Medications Prior to Visit  Medication Sig Dispense Refill  . albuterol (PROVENTIL) (2.5 MG/3ML) 0.083% nebulizer solution Inhale 2.5 mg into the lungs every 6 (six) hours as needed for wheezing or shortness of breath. Reported on 01/17/2016    . Blood Glucose Monitoring Suppl (ONETOUCH VERIO IQ SYSTEM) W/DEVICE KIT 1 Act by Does not apply route 3 (three) times daily. 2 kit 0  . irbesartan (AVAPRO) 300 MG tablet TAKE 1 TABLET(300 MG) BY MOUTH DAILY 90 tablet 0  . glucose blood (ONETOUCH VERIO) test strip TEST THREE TIMES DAILY 200 strip 2  . Insulin Pen Needle (NOVOFINE) 32G X 6 MM MISC 1 Act by Does not apply route daily. 100 each 3  . metFORMIN (GLUCOPHAGE) 1000 MG tablet Take 1 tablet (1,000 mg total) by mouth 2 (two) times daily with a meal. 60 tablet 0  . Semaglutide, 1 MG/DOSE, (OZEMPIC, 1 MG/DOSE,) 4 MG/3ML SOPN INJECT 1 MG SUBCUTANEOUS ONE DAY A WEEK 9 mL 1  . atorvastatin (LIPITOR) 20 MG tablet Take 1 tablet (20  mg total) by mouth daily. (Patient not taking: No sig reported) 90 tablet 1  . benzonatate (TESSALON) 100 MG capsule Take 1 capsule (100 mg total) by mouth 3 (three) times daily as needed. (Patient not taking: Reported on 01/24/2021) 20 capsule 0  . icosapent Ethyl (VASCEPA) 1 g capsule Take 2 capsules (2 g total) by mouth 2 (two) times daily. (Patient not taking: No sig reported) 360 capsule 1   No facility-administered medications prior to visit.    ROS Review of Systems  Constitutional: Negative.  Negative for chills, diaphoresis, fatigue and unexpected weight change.  HENT: Negative.   Respiratory: Negative.  Negative for cough, chest tightness, shortness of breath and wheezing.   Cardiovascular: Positive for leg swelling. Negative for chest pain and palpitations.  Gastrointestinal: Negative for abdominal pain, constipation, diarrhea, nausea and vomiting.  Endocrine: Negative.  Negative for polydipsia, polyphagia and polyuria.  Genitourinary: Negative.   Musculoskeletal: Positive for arthralgias. Negative for back pain and myalgias.  Skin: Negative.  Negative for color change and pallor.  Neurological: Negative.  Negative for dizziness, weakness, light-headedness and numbness.  Hematological: Negative for adenopathy. Does not bruise/bleed easily.  Psychiatric/Behavioral: Negative.     Objective:  BP (!) 160/102 (BP Location: Right Arm, Patient Position: Sitting, Cuff Size: Large)   Pulse 90   Temp 98.1 F (36.7 C) (Oral)   Resp 16   Ht 5' 3.5" (1.613  m)   Wt 217 lb 9.6 oz (98.7 kg)   SpO2 97%   BMI 37.94 kg/m   BP Readings from Last 3 Encounters:  01/24/21 (!) 160/102  04/10/20 (!) 146/92  02/08/20 138/78    Wt Readings from Last 3 Encounters:  01/24/21 217 lb 9.6 oz (98.7 kg)  04/10/20 217 lb (98.4 kg)  02/08/20 218 lb (98.9 kg)    Physical Exam Vitals reviewed.  Constitutional:      General: She is not in acute distress.    Appearance: Normal appearance. She is  obese. She is not ill-appearing, toxic-appearing or diaphoretic.  HENT:     Nose: Nose normal.     Mouth/Throat:     Mouth: Mucous membranes are moist.  Eyes:     General: No scleral icterus.    Conjunctiva/sclera: Conjunctivae normal.  Cardiovascular:     Rate and Rhythm: Normal rate and regular rhythm.     Heart sounds: No murmur heard.     Comments: EKG- NSR, 76 bpm LAD, voltage loss in V1 - no change from the prior EKG No LVH or Q waves Pulmonary:     Effort: Pulmonary effort is normal.     Breath sounds: No stridor. No wheezing, rhonchi or rales.  Chest:    Abdominal:     General: Abdomen is protuberant. Bowel sounds are normal. There is no distension.     Palpations: Abdomen is soft. There is no hepatomegaly, splenomegaly or mass.     Tenderness: There is no abdominal tenderness.  Musculoskeletal:        General: No swelling. Normal range of motion.     Cervical back: Neck supple.     Right lower leg: Edema (trace pitting) present.     Left lower leg: Edema (trace pitting) present.  Lymphadenopathy:     Cervical: No cervical adenopathy.  Skin:    General: Skin is warm and dry.     Findings: No lesion or rash.  Neurological:     General: No focal deficit present.     Mental Status: She is alert.  Psychiatric:        Mood and Affect: Mood normal.        Behavior: Behavior normal.     Lab Results  Component Value Date   WBC 6.8 01/24/2021   HGB 13.5 01/24/2021   HCT 41.0 01/24/2021   PLT 220.0 01/24/2021   GLUCOSE 118 (H) 01/24/2021   CHOL 169 01/24/2021   TRIG 219.0 (H) 01/24/2021   HDL 55.30 01/24/2021   LDLDIRECT 82.0 01/24/2021   LDLCALC UNABLE TO CALCULATE IF TRIGLYCERIDE OVER 400 mg/dL 04/06/2012   ALT 15 01/24/2021   AST 19 01/24/2021   NA 142 01/24/2021   K 4.1 01/24/2021   CL 105 01/24/2021   CREATININE 0.89 01/24/2021   BUN 15 01/24/2021   CO2 27 01/24/2021   TSH 2.52 01/24/2021   INR 0.91 07/26/2018   HGBA1C 7.6 (H) 01/24/2021    MICROALBUR 1.9 04/10/2020    VAS [place] LOWER EXTREMITY VENOUS (DVT)  Result Date: 11/30/2018  Lower Venous Study Indications: Pain.  Performing Technologist: Abram Sander RVS  Examination Guidelines: A complete evaluation includes B-mode imaging, spectral Doppler, color Doppler, and power Doppler as needed of all accessible portions of each vessel. Bilateral testing is considered an integral part of a complete examination. Limited examinations for reoccurring indications may be performed as noted.  Right Venous Findings: +---+---------------+---------+-----------+----------+-------+    CompressibilityPhasicitySpontaneityPropertiesSummary +---+---------------+---------+-----------+----------+-------+ CFVFull  Yes      Yes                          +---+---------------+---------+-----------+----------+-------+  Left Venous Findings: +---------+---------------+---------+-----------+----------+--------------+          CompressibilityPhasicitySpontaneityPropertiesSummary        +---------+---------------+---------+-----------+----------+--------------+ CFV      Full           Yes      Yes                                 +---------+---------------+---------+-----------+----------+--------------+ SFJ      Full                                                        +---------+---------------+---------+-----------+----------+--------------+ FV Prox  Full                                                        +---------+---------------+---------+-----------+----------+--------------+ FV Mid   Full                                                        +---------+---------------+---------+-----------+----------+--------------+ FV DistalFull                                                        +---------+---------------+---------+-----------+----------+--------------+ PFV      Full                                                         +---------+---------------+---------+-----------+----------+--------------+ POP      Full           Yes      Yes                                 +---------+---------------+---------+-----------+----------+--------------+ PTV      Full                                                        +---------+---------------+---------+-----------+----------+--------------+ PERO                                                  Not visualized +---------+---------------+---------+-----------+----------+--------------+    Summary: Right: No evidence of  common femoral vein obstruction. Left: There is no evidence of deep vein thrombosis in the lower extremity. No cystic structure found in the popliteal fossa.  *See table(s) above for measurements and observations. Electronically signed by Deitra Mayo MD on 11/30/2018 at 2:49:43 PM.    Final     Assessment & Plan:   Arelene was seen today for hypertension, hyperlipidemia and diabetes.  Diagnoses and all orders for this visit:  HTN (hypertension), benign- Her blood pressure is not adequately well controlled.  I have asked her to decrease her sodium intake and to improve her lifestyle modifications.  Will add indapamide to her current regimen. -     CBC with Differential/Platelet; Future -     Basic metabolic panel; Future -     TSH; Future -     Urinalysis, Routine w reflex microscopic; Future -     indapamide (LOZOL) 1.25 MG tablet; Take 1 tablet (1.25 mg total) by mouth daily. -     Urinalysis, Routine w reflex microscopic -     TSH -     Basic metabolic panel -     CBC with Differential/Platelet  Diabetes mellitus with neurological manifestations, uncontrolled ([place])- Her A1c is up to 7.6%.  I recommended that she improve her lifestyle modifications and to be more compliant with Metformin and the GLP-1 agonist. -     Ambulatory referral to Ophthalmology -     Hemoglobin A1c; Future -     glucose blood (ONETOUCH VERIO) test strip;  TEST THREE TIMES DAILY -     Hemoglobin A1c -     Semaglutide, 1 MG/DOSE, (OZEMPIC, 1 MG/DOSE,) 4 MG/3ML SOPN; INJECT 1 MG SUBCUTANEOUS ONE DAY A WEEK -     Insulin Pen Needle (NOVOFINE) 32G X 6 MM MISC; 1 Act by Does not apply route daily. -     metFORMIN (GLUCOPHAGE) 1000 MG tablet; Take 1 tablet (1,000 mg total) by mouth 2 (two) times daily with a meal.  Hyperlipidemia with target LDL less than 100- She has achieved her LDL goal is doing well on the statin. -     Lipid panel; Future -     Hepatic function panel; Future -     TSH; Future -     TSH -     Hepatic function panel -     Lipid panel -     atorvastatin (LIPITOR) 20 MG tablet; Take 1 tablet (20 mg total) by mouth daily.  Morbid obesity ([place])  Pure hyperglyceridemia -     Lipid panel; Future -     Lipid panel  Routine general medical examination at a health care facility  Vitamin D deficiency disease -     VITAMIN D 25 Hydroxy (Vit-D Deficiency, Fractures); Future -     VITAMIN D 25 Hydroxy (Vit-D Deficiency, Fractures)  Visit for screening mammogram -     MM DIGITAL SCREENING BILATERAL; Future  Skin lesion of chest wall -     Ambulatory referral to Dermatology  Pure hypertriglyceridemia- Improvement noted.  Will continue icosapent ethyl for cardiovascular risk reduction. -     icosapent Ethyl (VASCEPA) 1 g capsule; Take 2 capsules (2 g total) by mouth 2 (two) times daily.  Other orders -     LDL cholesterol, direct   I have discontinued Neoma Laming D. Dipalma's benzonatate. I am also having her start on indapamide. Additionally, I am having her maintain her OneTouch Verio IQ System, albuterol, irbesartan, OneTouch Verio, atorvastatin, icosapent Ethyl, Ozempic (  1 MG/DOSE), Insulin Pen Needle, and metFORMIN.  Meds ordered this encounter  Medications  . indapamide (LOZOL) 1.25 MG tablet    Sig: Take 1 tablet (1.25 mg total) by mouth daily.    Dispense:  90 tablet    Refill:  0  . glucose blood (ONETOUCH VERIO) test  strip    Sig: TEST THREE TIMES DAILY    Dispense:  200 strip    Refill:  2  . atorvastatin (LIPITOR) 20 MG tablet    Sig: Take 1 tablet (20 mg total) by mouth daily.    Dispense:  90 tablet    Refill:  1  . icosapent Ethyl (VASCEPA) 1 g capsule    Sig: Take 2 capsules (2 g total) by mouth 2 (two) times daily.    Dispense:  360 capsule    Refill:  1  . Semaglutide, 1 MG/DOSE, (OZEMPIC, 1 MG/DOSE,) 4 MG/3ML SOPN    Sig: INJECT 1 MG SUBCUTANEOUS ONE DAY A WEEK    Dispense:  9 mL    Refill:  1  . Insulin Pen Needle (NOVOFINE) 32G X 6 MM MISC    Sig: 1 Act by Does not apply route daily.    Dispense:  100 each    Refill:  3  . metFORMIN (GLUCOPHAGE) 1000 MG tablet    Sig: Take 1 tablet (1,000 mg total) by mouth 2 (two) times daily with a meal.    Dispense:  180 tablet    Refill:  1     Follow-up: Return in about 6 weeks (around 03/07/2021).  Scarlette Calico, MD

## 2021-01-24 NOTE — Patient Instructions (Signed)

## 2021-02-13 ENCOUNTER — Other Ambulatory Visit: Payer: Self-pay | Admitting: Internal Medicine

## 2021-02-13 ENCOUNTER — Telehealth: Payer: Self-pay | Admitting: Internal Medicine

## 2021-02-13 DIAGNOSIS — E1149 Type 2 diabetes mellitus with other diabetic neurological complication: Secondary | ICD-10-CM

## 2021-02-13 DIAGNOSIS — IMO0002 Reserved for concepts with insufficient information to code with codable children: Secondary | ICD-10-CM

## 2021-02-13 DIAGNOSIS — I10 Essential (primary) hypertension: Secondary | ICD-10-CM

## 2021-02-13 NOTE — Progress Notes (Signed)
  Chronic Care Management   Outreach Note  02/13/2021 Name: Abigail Collins MRN: [id number] DOB: [birth year]  Referred by: Janith Lima, MD Reason for referral : No chief complaint on file.   An unsuccessful telephone outreach was attempted today. The patient was referred to the pharmacist for assistance with care management and care coordination.   Follow Up Plan:   Carley Perdue [employer] Scheduler

## 2021-02-20 ENCOUNTER — Telehealth: Payer: Self-pay | Admitting: Internal Medicine

## 2021-02-20 NOTE — Progress Notes (Signed)
  Chronic Care Management   Note  02/20/2021 Name: Abigail Collins MRN: [id number] DOB: 08/16/1951  Abigail Collins is a 70 y.o. year old female who is a primary care patient of Janith Lima, MD. I reached out to Abigail Collins by phone today in response to a referral sent by Ms. Altamese Cabal Szatkowski's PCP, Janith Lima, MD.   Ms. Pong was given information about Chronic Care Management services today including:  1. CCM service includes personalized support from designated clinical staff supervised by her physician, including individualized plan of care and coordination with other care providers 2. 24/7 contact phone numbers for assistance for urgent and routine care needs. 3. Service will only be billed when office clinical staff spend 20 minutes or more in a month to coordinate care. 4. Only one practitioner may furnish and bill the service in a calendar month. 5. The patient may stop CCM services at any time (effective at the end of the month) by phone call to the office staff.   Patient agreed to services and verbal consent obtained.   Follow up plan:   Carley Perdue [employer] Scheduler

## 2021-03-12 ENCOUNTER — Ambulatory Visit: Payer: HMO | Admitting: Internal Medicine

## 2021-03-15 ENCOUNTER — Telehealth: Payer: Self-pay | Admitting: Pharmacist

## 2021-03-15 NOTE — Progress Notes (Signed)
    Chronic Care Management Pharmacy Assistant   Name: Abigail Collins MRN: [id number] DOB: Mar 07, 1951  Reason for Encounter: Initial Questions Appointment: Telephone 03/18/21 @ [facility]  Recent office visits:  01/24/21 Ronnald Ramp (PCP) - Hypertension. Start Indapamide 1.25mg . D/c Benzonatate. EKG done. Referral to Dermatology 7 Ophthalmology.  01/09/21 Valere Dross, [place] (PCP) - Televisit. Acute Bronchitis. Start Amoxicillin-clavulanate 875-125 MG tablet  & Benzonatate 100mg .  Recent consult visits:  None noted  Hospital visits:  None in previous 6 months  Medications: Outpatient Encounter Medications as of 03/15/2021  Medication Sig  . albuterol (PROVENTIL) (2.5 MG/3ML) 0.083% nebulizer solution Inhale 2.5 mg into the lungs every 6 (six) hours as needed for wheezing or shortness of breath. Reported on 01/17/2016  . atorvastatin (LIPITOR) 20 MG tablet Take 1 tablet (20 mg total) by mouth daily.  . Blood Glucose Monitoring Suppl (ONETOUCH VERIO IQ SYSTEM) W/DEVICE KIT 1 Act by Does not apply route 3 (three) times daily.  Marland Kitchen glucose blood (ONETOUCH VERIO) test strip TEST THREE TIMES DAILY  . icosapent Ethyl (VASCEPA) 1 g capsule Take 2 capsules (2 g total) by mouth 2 (two) times daily.  . indapamide (LOZOL) 1.25 MG tablet Take 1 tablet (1.25 mg total) by mouth daily.  . Insulin Pen Needle (NOVOFINE) 32G X 6 MM MISC 1 Act by Does not apply route daily.  . irbesartan (AVAPRO) 300 MG tablet TAKE 1 TABLET(300 MG) BY MOUTH DAILY  . metFORMIN (GLUCOPHAGE) 1000 MG tablet Take 1 tablet (1,000 mg total) by mouth 2 (two) times daily with a meal.  . Semaglutide, 1 MG/DOSE, (OZEMPIC, 1 MG/DOSE,) 4 MG/3ML SOPN INJECT 1 MG SUBCUTANEOUS ONE DAY A WEEK   No facility-administered encounter medications on file as of 03/15/2021.    Have you seen any other providers since your last visit?   Any changes in your medications or health?  Any side effects from any medications?   Do you have an symptoms or problems not  managed by your medications?   Any concerns about your health right now?   Has your provider asked that you check blood pressure, blood sugar, or follow special diet at home?   Do you get any type of exercise on a regular basis?   Can you think of a goal you would like to reach for your health?   Do you have any problems getting your medications?   Is there anything that you would like to discuss during the appointment?  Please bring medications and supplements to appointment  Patient states that she is not interested in CCM at this time. Patient states she is able to manage her medications and currently receives her Ozempic with 0 co-pay.  Star Rating Drugs: Atorvastatin - last fill 02/08/20 90D Irbesartan - last fill 02/13/21 90D Metformin - last fill 02/11/21 90D Ozempic - last fill 01/30/21 Horatio, RMA Clinical Pharmacists Assistant [phone]  Time Spent: [phone]

## 2021-03-18 ENCOUNTER — Telehealth: Payer: HMO

## 2021-03-18 ENCOUNTER — Telehealth: Payer: Self-pay | Admitting: Pharmacist

## 2021-03-18 NOTE — Progress Notes (Deleted)
Chronic Care Management Pharmacy Note  03/18/2021 Name:  Abigail Collins MRN:  [id number] DOB:  Sep 27, 1951  Summary: ***  Recommendations/Changes made from today's visit: ***  Plan: ***   Subjective: Abigail Collins is an 70 y.o. year old female who is a primary patient of Abigail Lima, MD.  The CCM team was consulted for assistance with disease management and care coordination needs.    Engaged with patient by telephone for initial visit in response to provider referral for pharmacy case management and/or care coordination services.   Consent to Services:  The patient was given the following information about Chronic Care Management services today, agreed to services, and gave verbal consent: 1. CCM service includes personalized support from designated clinical staff supervised by the primary care provider, including individualized plan of care and coordination with other care providers 2. 24/7 contact phone numbers for assistance for urgent and routine care needs. 3. Service will only be billed when office clinical staff spend 20 minutes or more in a month to coordinate care. 4. Only one practitioner may furnish and bill the service in a calendar month. 5.The patient may stop CCM services at any time (effective at the end of the month) by phone call to the office staff. 6. The patient will be responsible for cost sharing (co-pay) of up to 20% of the service fee (after annual deductible is met). Patient agreed to services and consent obtained.  Patient Care Team: Abigail Lima, MD as PCP - General (Internal Medicine) Charlton Haws, [facility] as Pharmacist (Pharmacist)  Recent office visits: 01/24/21 Ronnald Ramp (PCP) - Chronic f/u. BP 160/102. A1c 7.6. Start Indapamide 1.75m. EKG done. Referral to Dermatology, Ophthalmology.  01/09/21 MValere Dross FBlanchard(PCP) - Televisit. Acute Bronchitis. Start Amoxicillin-clavulanate 875-125 MG tablet  & Benzonatate 1069m  Recent consult  visits: none  Hospital visits: None in previous 6 months   Objective:  Lab Results  Component Value Date   CREATININE 0.89 01/24/2021   BUN 15 01/24/2021   GFR 66.39 01/24/2021   GFRNONAA >60 08/03/2018   GFRAA >60 08/03/2018   NA 142 01/24/2021   K 4.1 01/24/2021   CALCIUM 9.8 01/24/2021   CO2 27 01/24/2021   GLUCOSE 118 (H) 01/24/2021    Lab Results  Component Value Date/Time   HGBA1C 7.6 (H) 01/24/2021 12:11 PM   HGBA1C 6.9 (A) 04/10/2020 04:42 PM   HGBA1C 7.4 (H) 02/08/2020 04:12 PM   HGBA1C 6.5 04/12/2019 12:00 AM   HGBA1C 6.9 09/30/2017 12:00 AM   GFR 66.39 01/24/2021 12:11 PM   GFR 70.29 04/10/2020 04:36 PM   MICROALBUR 1.9 04/10/2020 04:36 PM   MICROALBUR 0.9 04/12/2019 01:54 PM    Last diabetic Eye exam:  Lab Results  Component Value Date/Time   HMDIABEYEEXA No Retinopathy 11/02/2019 12:00 AM    Last diabetic Foot exam:  Lab Results  Component Value Date/Time   HMDIABFOOTEX done 05/06/2018 12:00 AM     Lab Results  Component Value Date   CHOL 169 01/24/2021   HDL 55.30 01/24/2021   LDLCALC UNABLE TO CALCULATE IF TRIGLYCERIDE OVER 400 mg/dL 04/06/2012   LDLDIRECT 82.0 01/24/2021   TRIG 219.0 (H) 01/24/2021   CHOLHDL 3 01/24/2021    Hepatic Function Latest Ref Rng & Units 01/24/2021 04/12/2019 07/26/2018  Total Protein 6.0 - 8.3 g/dL 7.5 7.4 6.7  Albumin 3.5 - 5.2 g/dL 4.1 4.3 3.8  AST 0 - 37 U/L 19 21 26   ALT 0 - 35 U/L 15 18  19  Alk Phosphatase 39 - 117 U/L 77 83 70  Total Bilirubin 0.2 - 1.2 mg/dL 0.6 0.6 0.8  Bilirubin, Direct 0.0 - 0.3 mg/dL 0.1 - -    Lab Results  Component Value Date/Time   TSH 2.52 01/24/2021 12:11 PM   TSH 1.79 04/12/2019 01:54 PM    CBC Latest Ref Rng & Units 01/24/2021 04/12/2019 08/03/2018  WBC 4.0 - 10.5 K/uL 6.8 8.8 6.9  Hemoglobin 12.0 - 15.0 g/dL 13.5 14.3 12.0  Hematocrit 36.0 - 46.0 % 41.0 42.5 39.1  Platelets 150.0 - 400.0 K/uL 220.0 205.0 153    Lab Results  Component Value Date/Time   VD25OH  40.65 01/24/2021 12:11 PM   VD25OH 42.15 04/12/2019 01:54 PM    Clinical ASCVD: No  The 10-year ASCVD risk score Mikey Bussing DC Jr., et al., 2013) is: 29.1%   Values used to calculate the score:     Age: 29 years     Sex: Female     Is Non-Hispanic African American: No     Diabetic: Yes     Tobacco smoker: No     Systolic Blood Pressure: 124 mmHg     Is BP treated: Yes     HDL Cholesterol: 55.3 mg/dL     Total Cholesterol: 169 mg/dL    Depression screen [facility] 2/9 01/24/2021 08/29/2020 10/31/2019  Decreased Interest 0 0 0  Down, Depressed, Hopeless 0 0 0  PHQ - 2 Score 0 0 0  Altered sleeping - - -  Tired, decreased energy - - -  Change in appetite - - -  Feeling bad or failure about yourself  - - -  Trouble concentrating - - -  Moving slowly or fidgety/restless - - -  Suicidal thoughts - - -  PHQ-9 Score - - -  Difficult doing work/chores - - -       Social History   Tobacco Use  Smoking Status Former Smoker  . Packs/day: 0.25  . Years: 20.00  . Pack years: 5.00  . Types: Cigarettes  . Quit date: 10/13/2009  . Years since quitting: 11.4  Smokeless Tobacco Never Used   BP Readings from Last 3 Encounters:  01/24/21 (!) 160/102  04/10/20 (!) 146/92  02/08/20 138/78   Pulse Readings from Last 3 Encounters:  01/24/21 90  04/10/20 90  02/08/20 97   Wt Readings from Last 3 Encounters:  01/24/21 217 lb 9.6 oz (98.7 kg)  04/10/20 217 lb (98.4 kg)  02/08/20 218 lb (98.9 kg)   BMI Readings from Last 3 Encounters:  01/24/21 37.94 kg/m  04/10/20 37.84 kg/m  02/08/20 38.01 kg/m    Assessment/Interventions: Review of patient past medical history, allergies, medications, health status, including review of consultants reports, laboratory and other test data, was performed as part of comprehensive evaluation and provision of chronic care management services.   SDOH:  (Social Determinants of Health) assessments and interventions performed: Yes  SDOH Screenings   Alcohol  Screen: Not on file  Depression (PHQ2-9): Low Risk   . PHQ-2 Score: 0  Financial Resource Strain: Not on file  Food Insecurity: Not on file  Housing: Not on file  Physical Activity: Not on file  Social Connections: Not on file  Stress: Not on file  Tobacco Use: Medium Risk  . Smoking Tobacco Use: Former Smoker  . Smokeless Tobacco Use: Never Used  Transportation Needs: No Transportation Needs  . Lack of Transportation (Medical): No  . Lack of Transportation (Non-Medical): No  CCM Care Plan  Allergies  Allergen Reactions  . Lisinopril Shortness Of Breath  . Amlodipine Swelling    UNSPECIFIED EDEMA  . Tape Other (See Comments)    Tore skin--?hypofix  . Morphine And Related Nausea And Vomiting  . Percocet [Oxycodone-Acetaminophen] Nausea And Vomiting    Medications Reviewed Today    Reviewed by Abigail Lima, MD (Physician) on 01/24/21 at 1201  Med List Status: <None>  Medication Order Taking? Sig Documenting Provider Last Dose Status Informant  albuterol (PROVENTIL) (2.5 MG/3ML) 0.083% nebulizer solution [id number] Yes Inhale 2.5 mg into the lungs every 6 (six) hours as needed for wheezing or shortness of breath. Reported on 01/17/2016 [provider] Taking Active Self           Med Note Modena Nunnery, Aldona Bar T   Fri Jan 01, 2018 10:08 AM)    atorvastatin (LIPITOR) 20 MG tablet [id number] No Take 1 tablet (20 mg total) by mouth daily.  Patient not taking: No sig reported   Abigail Lima, MD Not Taking Active   benzonatate (TESSALON) 100 MG capsule [id number] No Take 1 capsule (100 mg total) by mouth 3 (three) times daily as needed.  Patient not taking: Reported on 01/24/2021   Marrian Salvage, [place] Not Taking Active   Blood Glucose Monitoring Suppl [facility] VERIO IQ SYSTEM) W/DEVICE KIT [id number] Yes 1 Act by Does not apply route 3 (three) times daily. Abigail Lima, MD Taking Active Self  glucose blood [facility] VERIO) test strip [id number] Yes TEST THREE TIMES  DAILY Abigail Lima, MD Taking Active   icosapent Ethyl (VASCEPA) 1 g capsule [id number] No Take 2 capsules (2 g total) by mouth 2 (two) times daily.  Patient not taking: No sig reported   Abigail Lima, MD Not Taking Active   indapamide (LOZOL) 1.25 MG tablet [id number] Yes Take 1 tablet (1.25 mg total) by mouth daily. Abigail Lima, MD  Active   Insulin Pen Needle (NOVOFINE) 32G X 6 MM MISC [id number] Yes 1 Act by Does not apply route daily. Abigail Lima, MD Taking Active Self  irbesartan (AVAPRO) 300 MG tablet [id number] Yes TAKE 1 TABLET(300 MG) BY MOUTH DAILY Abigail Lima, MD Taking Active   metFORMIN (GLUCOPHAGE) 1000 MG tablet [id number] Yes Take 1 tablet (1,000 mg total) by mouth 2 (two) times daily with a meal. Abigail Lima, MD Taking Active   Semaglutide, 1 MG/DOSE, (OZEMPIC, 1 MG/DOSE,) 4 MG/3ML SOPN [id number] Yes INJECT 1 MG SUBCUTANEOUS ONE DAY A WEEK Abigail Lima, MD Taking Active           Patient Active Problem List   Diagnosis Date Noted  . Skin lesion of chest wall 01/24/2021  . Age-related osteoporosis without current pathological fracture 04/12/2019  . OAB (overactive bladder) 04/12/2019  . Vitamin D deficiency disease 05/07/2018  . Excessive daytime sleepiness 05/27/2016  . COPD mixed type ([place]) 01/09/2016  . Visit for screening mammogram 03/28/2015  . Depression with somatization 03/28/2015  . Insomnia 03/28/2015  . Primary osteoarthritis of both knees 03/28/2015  . Allergic rhinitis 03/15/2014  . Hyperlipidemia with target LDL less than 100 12/22/2013  . Routine general medical examination at a health care facility 08/03/2013  . Pure hyperglyceridemia 08/05/2012  . Gastroparesis due to DM ([place]) 08/05/2012  . Diabetes mellitus with neurological manifestations, uncontrolled ([place]) 04/05/2012  . HTN (hypertension), benign 04/05/2012  . GERD (gastroesophageal reflux disease) 04/05/2012  . Morbid obesity ([place]) 04/05/2012  . Primary osteoarthritis of  left knee 06/19/2011    Immunization History  Administered Date(s) Administered  . Fluad Quad(high Dose 65+) 07/08/2019  . Influenza Split 08/05/2012  . Influenza, High Dose Seasonal PF 07/02/2017, 07/02/2018  . Influenza,inj,Quad PF,6+ Mos 09/03/2015, 07/29/2016  . PFIZER(Purple Top)SARS-COV-2 Vaccination 12/11/2019, 01/10/2020  . Pneumococcal Conjugate-13 10/31/2019  . Pneumococcal Polysaccharide-23 04/06/2012, 08/05/2018  . Tdap 10/31/2019    Conditions to be addressed/monitored:  Hypertension, Hyperlipidemia, Diabetes and COPD  There are no care plans that you recently modified to display for this patient.    Medication Assistance: {MEDASSISTANCEINFO:25044}  Compliance/Adherence/Medication fill history: Care Gaps: Shingrix Covid booster dose #3 (de 06/11/20) Eye exam (due 11/01/20)  Star-Rating Drugs: Metformin - LF 02/11/21 x 90 ds Irbesartan - LF 02/13/21 x 90 ds Ozempic - LF 01/30/21 x 90 ds Atorvastatin - LF 02/08/20 x 90 ds   Patient's preferred pharmacy is:  [id number] [address] [place], [address] AT [address] [place] [address] Phone: [phone] Fax: [phone]  Uses pill box? {Yes or If no, why not?:20788} Pt endorses ***% compliance  We discussed: {Pharmacy options:24294} Patient decided to: {US Pharmacy Plan:23885}  Care Plan and Follow Up Patient Decision:  {FOLLOWUP:24991}  Plan: {CM FOLLOW UP TYOM:60045}  ***    Current Barriers:  . {pharmacybarriers:24917}  Pharmacist Clinical Goal(s):  Marland Kitchen Patient will {PHARMACYGOALCHOICES:24921} through collaboration with PharmD and provider.   Interventions: . 1:1 collaboration with Abigail Lima, MD regarding development and update of comprehensive plan of care as evidenced by provider attestation and co-signature . Inter-disciplinary care team collaboration (see longitudinal plan of care) . Comprehensive medication review performed; medication list updated in  electronic medical record  Hypertension (BP goal <140/90) -{US controlled/uncontrolled:25276} -Current treatment: . Irbesartan 300 mg daily . Indapamide 1.25 mg daily -Medications previously tried: ***  -Current home readings: *** -Current dietary habits: *** -Current exercise habits: *** -{ACTIONS;DENIES/REPORTS:21021675::"Denies"} hypotensive/hypertensive symptoms -Educated on {CCM BP Counseling:25124} -Counseled to monitor BP at home ***, document, and provide log at future appointments -{CCMPHARMDINTERVENTION:25122}  Hyperlipidemia: (LDL goal < 100) -{US controlled/uncontrolled:25276} -Current treatment: . Atorvastatin 20 mg daily . Vascepa 1 g - 2 cap BID -Medications previously tried: ***  -Current dietary patterns: *** -Current exercise habits: *** -Educated on {CCM HLD Counseling:25126} -{CCMPHARMDINTERVENTION:25122}  Diabetes (A1c goal <7%) -{US controlled/uncontrolled:25276} -Current medications: Marland Kitchen Metformin 1000 mg BID . Ozempic 1 mg weekly . Testing supplies -Medications previously tried: ***  -Current home glucose readings . fasting glucose: *** . post prandial glucose: *** -{ACTIONS;DENIES/REPORTS:21021675::"Denies"} hypoglycemic/hyperglycemic symptoms -Current meal patterns:  . breakfast: ***  . lunch: ***  . dinner: *** . snacks: *** . drinks: *** -Current exercise: *** -Educated on {CCM DM COUNSELING:25123} -Counseled to check feet daily and get yearly eye exams -{CCMPHARMDINTERVENTION:25122}  COPD (Goal: control symptoms and prevent exacerbations) -{US controlled/uncontrolled:25276} -Current treatment  . Albuterol 0.083% nebulizer soln PRN -Medications previously tried: ***  -Gold Grade: unkown - no PFTs on file -Current COPD Classification:  {CHL HP [facility] COPD Classification:[phone]} -MMRC/CAT score: not on file -Pulmonary function testing: not on file -Exacerbations requiring treatment in last 6 months: *** -Patient {Actions;  denies-reports:120008} consistent use of maintenance inhaler -Frequency of rescue inhaler use: *** -Counseled on {CCMINHALERCOUNSELING:25121} -{CCMPHARMDINTERVENTION:25122}  Health Maintenance -Vaccine gaps: Shingrix, Covid dose #3 -Current therapy:  . *** -Educated on {ccm supplement counseling:25128} -{CCM Patient satisfied:25129} -{CCMPHARMDINTERVENTION:25122}  Patient Goals/Self-Care Activities . Patient will:  - {pharmacypatientgoals:24919}  Follow Up Plan: {CM FOLLOW UP TXHF:41423}

## 2021-03-18 NOTE — Telephone Encounter (Signed)
  Chronic Care Management   Outreach Note  03/18/2021 Name: Abigail Collins MRN: [id number] DOB: 01-26-1951  Referred by: Janith Lima, MD  Patient had a phone appointment scheduled with clinical pharmacist today.  An unsuccessful telephone outreach was attempted today. The patient was referred to the pharmacist for assistance with care management and care coordination.   If possible, a message was left to return call to: [phone] or to [facility]: [place], PharmD, Para [place], CPP Clinical Pharmacist [facility] [phone]

## 2021-04-04 ENCOUNTER — Other Ambulatory Visit: Payer: Self-pay | Admitting: Internal Medicine

## 2021-04-04 DIAGNOSIS — IMO0002 Reserved for concepts with insufficient information to code with codable children: Secondary | ICD-10-CM

## 2021-04-04 DIAGNOSIS — E1149 Type 2 diabetes mellitus with other diabetic neurological complication: Secondary | ICD-10-CM

## 2021-04-18 ENCOUNTER — Ambulatory Visit: Payer: HMO | Admitting: Internal Medicine

## 2021-04-23 ENCOUNTER — Ambulatory Visit: Payer: HMO | Admitting: Orthopaedic Surgery

## 2021-05-07 ENCOUNTER — Ambulatory Visit: Payer: HMO | Admitting: Orthopaedic Surgery

## 2021-05-14 ENCOUNTER — Telehealth: Payer: Self-pay | Admitting: Internal Medicine

## 2021-05-14 NOTE — Telephone Encounter (Signed)
LVM for pt to rtn my call to schedule AWV with [facility]. Please schedule this appt if pt calls the office.  °

## 2021-05-17 ENCOUNTER — Ambulatory Visit: Payer: HMO | Admitting: Orthopaedic Surgery

## 2021-05-20 ENCOUNTER — Ambulatory Visit: Payer: HMO | Admitting: Internal Medicine

## 2021-05-23 ENCOUNTER — Ambulatory Visit (INDEPENDENT_AMBULATORY_CARE_PROVIDER_SITE_OTHER): Payer: HMO | Admitting: Internal Medicine

## 2021-05-23 ENCOUNTER — Other Ambulatory Visit: Payer: Self-pay

## 2021-05-23 ENCOUNTER — Encounter: Payer: Self-pay | Admitting: Internal Medicine

## 2021-05-23 VITALS — BP 156/98 | HR 77 | Temp 98.1°F | Resp 16 | Ht 63.5 in | Wt 218.0 lb

## 2021-05-23 DIAGNOSIS — L989 Disorder of the skin and subcutaneous tissue, unspecified: Secondary | ICD-10-CM | POA: Diagnosis not present

## 2021-05-23 DIAGNOSIS — Z0001 Encounter for general adult medical examination with abnormal findings: Secondary | ICD-10-CM | POA: Diagnosis not present

## 2021-05-23 DIAGNOSIS — K2100 Gastro-esophageal reflux disease with esophagitis, without bleeding: Secondary | ICD-10-CM | POA: Diagnosis not present

## 2021-05-23 DIAGNOSIS — E1165 Type 2 diabetes mellitus with hyperglycemia: Secondary | ICD-10-CM | POA: Diagnosis not present

## 2021-05-23 DIAGNOSIS — I10 Essential (primary) hypertension: Secondary | ICD-10-CM

## 2021-05-23 DIAGNOSIS — E1149 Type 2 diabetes mellitus with other diabetic neurological complication: Secondary | ICD-10-CM

## 2021-05-23 DIAGNOSIS — R1319 Other dysphagia: Secondary | ICD-10-CM

## 2021-05-23 DIAGNOSIS — E785 Hyperlipidemia, unspecified: Secondary | ICD-10-CM

## 2021-05-23 DIAGNOSIS — IMO0002 Reserved for concepts with insufficient information to code with codable children: Secondary | ICD-10-CM

## 2021-05-23 DIAGNOSIS — Z1231 Encounter for screening mammogram for malignant neoplasm of breast: Secondary | ICD-10-CM

## 2021-05-23 LAB — BASIC METABOLIC PANEL
BUN: 19 mg/dL (ref 6–23)
CO2: 29 mEq/L (ref 19–32)
Calcium: 9.7 mg/dL (ref 8.4–10.5)
Chloride: 105 mEq/L (ref 96–112)
Creatinine, Ser: 0.84 mg/dL (ref 0.40–1.20)
GFR: 71 mL/min (ref >60.00)
Glucose, Bld: 106 mg/dL — ABNORMAL HIGH (ref 70–99)
Potassium: 4.2 mEq/L (ref 3.5–5.1)
Sodium: 141 mEq/L (ref 135–145)

## 2021-05-23 LAB — HEMOGLOBIN A1C: Hgb A1c MFr Bld: 7.7 % — ABNORMAL HIGH (ref 4.6–6.5)

## 2021-05-23 LAB — CBC WITH DIFFERENTIAL/PLATELET
Basophils Absolute: 0.1 10*3/uL (ref 0.0–0.1)
Basophils Relative: 0.8 % (ref 0.0–3.0)
Eosinophils Absolute: 0.3 10*3/uL (ref 0.0–0.7)
Eosinophils Relative: 4.4 % (ref 0.0–5.0)
HCT: 40.5 % (ref 36.0–46.0)
Hemoglobin: 13.2 g/dL (ref 12.0–15.0)
Lymphocytes Relative: 42.8 % (ref 12.0–46.0)
Lymphs Abs: 2.9 10*3/uL (ref 0.7–4.0)
MCHC: 32.6 g/dL (ref 30.0–36.0)
MCV: 88.7 fl (ref 78.0–100.0)
Monocytes Absolute: 0.4 10*3/uL (ref 0.1–1.0)
Monocytes Relative: 5.7 % (ref 3.0–12.0)
Neutro Abs: 3.1 10*3/uL (ref 1.4–7.7)
Neutrophils Relative %: 46.3 % (ref 43.0–77.0)
Platelets: 175 10*3/uL (ref 150.0–400.0)
RBC: 4.56 Mil/uL (ref 3.87–5.11)
RDW: 14.3 % (ref 11.5–15.5)
WBC: 6.8 10*3/uL (ref 4.0–10.5)

## 2021-05-23 MED ORDER — METFORMIN HCL 1000 MG PO TABS: 1000.0000 mg | ORAL_TABLET | Freq: Two times a day (BID) | ORAL | 0 refills | Status: DC

## 2021-05-23 MED ORDER — SEMAGLUTIDE (2 MG/DOSE) 8 MG/3ML ~~LOC~~ SOPN: 2.0000 mg | PEN_INJECTOR | SUBCUTANEOUS | 1 refills | Status: DC

## 2021-05-23 MED ORDER — INDAPAMIDE 1.25 MG PO TABS: 1.2500 mg | ORAL_TABLET | Freq: Every day | ORAL | 0 refills | Status: DC

## 2021-05-23 MED ORDER — ATORVASTATIN CALCIUM 20 MG PO TABS: 20.0000 mg | ORAL_TABLET | Freq: Every day | ORAL | 1 refills | Status: DC

## 2021-05-23 MED ORDER — DEXLANSOPRAZOLE 60 MG PO CPDR: 60.0000 mg | DELAYED_RELEASE_CAPSULE | Freq: Every day | ORAL | 1 refills | Status: DC

## 2021-05-23 NOTE — Patient Instructions (Signed)

## 2021-05-23 NOTE — Progress Notes (Signed)
Subjective:  Patient ID: Abigail Collins, female    DOB: Dec 15, 1950  Age: 69 y.o. MRN: [id number]  CC: Annual Exam, Gastroesophageal Reflux, Hypertension, Diabetes, and Hyperlipidemia  This visit occurred during the SARS-CoV-2 public health emergency.  Safety protocols were in place, including screening questions prior to the visit, additional usage of staff PPE, and extensive cleaning of exam room while observing appropriate contact time as indicated for disinfecting solutions.    HPI Abigail Collins presents for a CPX and f/up -   She complains of a 2-monthhistory of heartburn and odynophagia with dysphagia.  She feels like solid food get stuck in her lower esophagus.  She has gotten some symptom relief with [place]  She is also gotten some relief with Tums.  Unfortunately she is also been taking Alka-Seltzer.  She has had a few episodes of choking.  She denies abdominal pain, loss of appetite, weight loss, melena, or bright red blood per rectum.  She complains of lower extremity edema and tells me she is not taking indapamide.  She denies chest pain, shortness of breath, diaphoresis, dizziness, or lightheadedness.  She has a lesion on her right forearm that she wants to see a dermatologist about.  Outpatient Medications Prior to Visit  Medication Sig Dispense Refill   albuterol (PROVENTIL) (2.5 MG/3ML) 0.083% nebulizer solution Inhale 2.5 mg into the lungs every 6 (six) hours as needed for wheezing or shortness of breath. Reported on 01/17/2016     Blood Glucose Monitoring Suppl (ONETOUCH VERIO IQ SYSTEM) W/DEVICE KIT 1 Act by Does not apply route 3 (three) times daily. 2 kit 0   glucose blood (ONETOUCH VERIO) test strip TEST THREE TIMES DAILY 200 strip 2   Insulin Pen Needle (NOVOFINE) 32G X 6 MM MISC 1 Act by Does not apply route daily. 100 each 3   irbesartan (AVAPRO) 300 MG tablet TAKE 1 TABLET(300 MG) BY MOUTH DAILY 90 tablet 1   atorvastatin  (LIPITOR) 20 MG tablet Take 1 tablet (20 mg total) by mouth daily. 90 tablet 1   icosapent Ethyl (VASCEPA) 1 g capsule Take 2 capsules (2 g total) by mouth 2 (two) times daily. 360 capsule 1   indapamide (LOZOL) 1.25 MG tablet Take 1 tablet (1.25 mg total) by mouth daily. 90 tablet 0   metFORMIN (GLUCOPHAGE) 1000 MG tablet TAKE 1 TABLET(1000 MG) BY MOUTH TWICE DAILY WITH A MEAL 60 tablet 0   Semaglutide, 1 MG/DOSE, (OZEMPIC, 1 MG/DOSE,) 4 MG/3ML SOPN INJECT 1 MG SUBCUTANEOUS ONE DAY A WEEK 9 mL 1   No facility-administered medications prior to visit.    ROS Review of Systems  Constitutional:  Negative for chills, diaphoresis, fatigue and fever.  HENT:  Positive for trouble swallowing. Negative for sinus pressure and voice change.   Respiratory:  Positive for choking. Negative for cough, shortness of breath and wheezing.   Cardiovascular:  Positive for leg swelling. Negative for chest pain and palpitations.  Gastrointestinal:  Negative for abdominal pain, constipation, diarrhea, nausea and vomiting.  Genitourinary: Negative.  Negative for decreased urine volume, difficulty urinating, flank pain, frequency, hematuria and urgency.  Musculoskeletal:  Positive for arthralgias. Negative for myalgias.  Skin: Negative.   Neurological: Negative.  Negative for dizziness, weakness, light-headedness and headaches.  Hematological:  Negative for adenopathy. Does not bruise/bleed easily.  Psychiatric/Behavioral: Negative.     Objective:  BP (!) 156/98 (BP Location: Right Arm, Patient Position: Sitting, Cuff Size: Large)  Pulse 77   Temp 98.1 F (36.7 C) (Oral)   Resp 16   Ht 5' 3.5" (1.613 m)   Wt 218 lb (98.9 kg)   SpO2 97%   BMI 38.01 kg/m   BP Readings from Last 3 Encounters:  05/23/21 (!) 156/98  01/24/21 (!) 160/102  04/10/20 (!) 146/92    Wt Readings from Last 3 Encounters:  05/23/21 218 lb (98.9 kg)  01/24/21 217 lb 9.6 oz (98.7 kg)  04/10/20 217 lb (98.4 kg)    Physical  Exam Vitals reviewed.  Constitutional:      Appearance: She is obese.  HENT:     Nose: Nose normal.     Mouth/Throat:     Mouth: Mucous membranes are moist.  Eyes:     Conjunctiva/sclera: Conjunctivae normal.  Cardiovascular:     Rate and Rhythm: Normal rate and regular rhythm.     Heart sounds: No murmur heard. Pulmonary:     Effort: Pulmonary effort is normal.     Breath sounds: No stridor. No wheezing, rhonchi or rales.  Abdominal:     General: Abdomen is protuberant. Bowel sounds are normal. There is no distension.     Palpations: Abdomen is soft. There is no hepatomegaly, splenomegaly or mass.  Musculoskeletal:        General: Normal range of motion.     Cervical back: Neck supple.     Right lower leg: No edema.     Left lower leg: No edema.  Lymphadenopathy:     Cervical: No cervical adenopathy.  Skin:    General: Skin is warm.  Neurological:     General: No focal deficit present.     Mental Status: She is alert.  Psychiatric:        Mood and Affect: Mood normal.        Behavior: Behavior normal.    Lab Results  Component Value Date   WBC 6.8 05/23/2021   HGB 13.2 05/23/2021   HCT 40.5 05/23/2021   PLT 175.0 05/23/2021   GLUCOSE 106 (H) 05/23/2021   CHOL 169 01/24/2021   TRIG 219.0 (H) 01/24/2021   HDL 55.30 01/24/2021   LDLDIRECT 82.0 01/24/2021   LDLCALC UNABLE TO CALCULATE IF TRIGLYCERIDE OVER 400 mg/dL 04/06/2012   ALT 15 01/24/2021   AST 19 01/24/2021   NA 141 05/23/2021   K 4.2 05/23/2021   CL 105 05/23/2021   CREATININE 0.84 05/23/2021   BUN 19 05/23/2021   CO2 29 05/23/2021   TSH 2.52 01/24/2021   INR 0.91 07/26/2018   HGBA1C 7.7 (H) 05/23/2021   MICROALBUR 0.9 05/23/2021    VAS [place] LOWER EXTREMITY VENOUS (DVT)  Result Date: 11/30/2018  Lower Venous Study Indications: Pain.  Performing Technologist: Abram Sander RVS  Examination Guidelines: A complete evaluation includes B-mode imaging, spectral Doppler, color Doppler, and power Doppler as  needed of all accessible portions of each vessel. Bilateral testing is considered an integral part of a complete examination. Limited examinations for reoccurring indications may be performed as noted.  Right Venous Findings: +---+---------------+---------+-----------+----------+-------+    CompressibilityPhasicitySpontaneityPropertiesSummary +---+---------------+---------+-----------+----------+-------+ CFVFull           Yes      Yes                          +---+---------------+---------+-----------+----------+-------+  Left Venous Findings: +---------+---------------+---------+-----------+----------+--------------+          CompressibilityPhasicitySpontaneityPropertiesSummary        +---------+---------------+---------+-----------+----------+--------------+ CFV  Full           Yes      Yes                                 +---------+---------------+---------+-----------+----------+--------------+ SFJ      Full                                                        +---------+---------------+---------+-----------+----------+--------------+ FV Prox  Full                                                        +---------+---------------+---------+-----------+----------+--------------+ FV Mid   Full                                                        +---------+---------------+---------+-----------+----------+--------------+ FV DistalFull                                                        +---------+---------------+---------+-----------+----------+--------------+ PFV      Full                                                        +---------+---------------+---------+-----------+----------+--------------+ POP      Full           Yes      Yes                                 +---------+---------------+---------+-----------+----------+--------------+ PTV      Full                                                         +---------+---------------+---------+-----------+----------+--------------+ PERO                                                  Not visualized +---------+---------------+---------+-----------+----------+--------------+    Summary: Right: No evidence of common femoral vein obstruction. Left: There is no evidence of deep vein thrombosis in the lower extremity. No cystic structure found in the popliteal fossa.  *See table(s) above for measurements and observations. Electronically signed by Deitra Mayo MD on 11/30/2018 at 2:49:43 PM.    Final     Assessment & Plan:   Abigail Collins was seen today for  annual exam, gastroesophageal reflux, hypertension, diabetes and hyperlipidemia.  Diagnoses and all orders for this visit:  HTN (hypertension), benign- Her blood pressure is not adequately well controlled.  Will restart the thiazide diuretic. -     indapamide (LOZOL) 1.25 MG tablet; Take 1 tablet (1.25 mg total) by mouth daily. -     CBC with Differential/Platelet; Future -     Basic metabolic panel; Future -     Urinalysis, Routine w reflex microscopic; Future -     Urinalysis, Routine w reflex microscopic -     Basic metabolic panel -     CBC with Differential/Platelet  Hyperlipidemia with target LDL less than 100- LDL goal achieved. Doing well on the statin  -     atorvastatin (LIPITOR) 20 MG tablet; Take 1 tablet (20 mg total) by mouth daily.  Diabetes mellitus with neurological manifestations, uncontrolled ([place])- Her A1c remains too high.  I recommended that she double dose of the GLP-1 agonist. -     metFORMIN (GLUCOPHAGE) 1000 MG tablet; Take 1 tablet (1,000 mg total) by mouth 2 (two) times daily with a meal. -     HM Diabetes Foot Exam -     Ambulatory referral to Ophthalmology -     Microalbumin / creatinine urine ratio; Future -     Hemoglobin A1c; Future -     Hemoglobin A1c -     Microalbumin / creatinine urine ratio -     Semaglutide, 2 MG/DOSE, 8 MG/3ML SOPN; Inject 2 mg  as directed once a week.  Esophageal dysphagia -     Ambulatory referral to Gastroenterology  Gastroesophageal reflux disease with esophagitis without hemorrhage- She agrees to stop taking Alka-seltzer and to start taking a PPI. -     dexlansoprazole (DEXILANT) 60 MG capsule; Take 1 capsule (60 mg total) by mouth daily. -     CBC with Differential/Platelet; Future -     CBC with Differential/Platelet  Visit for screening mammogram -     MM DIGITAL SCREENING BILATERAL; Future  Encounter for general adult medical examination with abnormal findings- Exam completed, labs reviewed, vaccines reviewed and updated, cancer screenings addressed, patient education material was given.  Skin lesion of right arm -     Ambulatory referral to Dermatology  I have discontinued Abigail Collins's icosapent Ethyl and Ozempic (1 MG/DOSE). I have also changed her metFORMIN. Additionally, I am having her start on dexlansoprazole and Semaglutide (2 MG/DOSE). Lastly, I am having her maintain her OneTouch Verio IQ System, albuterol, OneTouch Verio, Insulin Pen Needle, irbesartan, atorvastatin, and indapamide.  Meds ordered this encounter  Medications   atorvastatin (LIPITOR) 20 MG tablet    Sig: Take 1 tablet (20 mg total) by mouth daily.    Dispense:  120 tablet    Refill:  1   indapamide (LOZOL) 1.25 MG tablet    Sig: Take 1 tablet (1.25 mg total) by mouth daily.    Dispense:  90 tablet    Refill:  0   metFORMIN (GLUCOPHAGE) 1000 MG tablet    Sig: Take 1 tablet (1,000 mg total) by mouth 2 (two) times daily with a meal.    Dispense:  180 tablet    Refill:  0   dexlansoprazole (DEXILANT) 60 MG capsule    Sig: Take 1 capsule (60 mg total) by mouth daily.    Dispense:  120 capsule    Refill:  1   Semaglutide, 2 MG/DOSE, 8 MG/3ML SOPN    Sig:  Inject 2 mg as directed once a week.    Dispense:  9 mL    Refill:  1     Follow-up: Return in about 3 months (around 08/23/2021).  Scarlette Calico, MD

## 2021-05-24 ENCOUNTER — Encounter: Payer: Self-pay | Admitting: Internal Medicine

## 2021-05-24 LAB — URINALYSIS, ROUTINE W REFLEX MICROSCOPIC
Bilirubin Urine: NEGATIVE
Hgb urine dipstick: NEGATIVE
Ketones, ur: NEGATIVE
Leukocytes,Ua: NEGATIVE
Nitrite: NEGATIVE
Specific Gravity, Urine: 1.02 (ref 1.000–1.030)
Total Protein, Urine: NEGATIVE
Urine Glucose: NEGATIVE
Urobilinogen, UA: 0.2 (ref 0.0–1.0)
pH: 6 (ref 5.0–8.0)

## 2021-05-24 LAB — MICROALBUMIN / CREATININE URINE RATIO
Creatinine,U: 93.1 mg/dL
Microalb Creat Ratio: 0.9 mg/g (ref 0.0–30.0)
Microalb, Ur: 0.9 mg/dL (ref 0.0–1.9)

## 2021-05-28 ENCOUNTER — Ambulatory Visit: Payer: HMO | Admitting: Orthopaedic Surgery

## 2021-06-24 ENCOUNTER — Other Ambulatory Visit: Payer: Self-pay

## 2021-06-24 ENCOUNTER — Telehealth (INDEPENDENT_AMBULATORY_CARE_PROVIDER_SITE_OTHER): Payer: HMO | Admitting: Family

## 2021-06-24 ENCOUNTER — Telehealth: Payer: Self-pay | Admitting: Internal Medicine

## 2021-06-24 VITALS — BP 112/77 | HR 112 | Temp 97.4°F

## 2021-06-24 DIAGNOSIS — U071 COVID-19: Secondary | ICD-10-CM | POA: Insufficient documentation

## 2021-06-24 DIAGNOSIS — E1149 Type 2 diabetes mellitus with other diabetic neurological complication: Secondary | ICD-10-CM

## 2021-06-24 DIAGNOSIS — E1165 Type 2 diabetes mellitus with hyperglycemia: Secondary | ICD-10-CM

## 2021-06-24 DIAGNOSIS — IMO0002 Reserved for concepts with insufficient information to code with codable children: Secondary | ICD-10-CM

## 2021-06-24 MED ORDER — MOLNUPIRAVIR 200 MG PO CAPS: 800.0000 mg | ORAL_CAPSULE | Freq: Two times a day (BID) | ORAL | 0 refills | Status: AC

## 2021-06-24 NOTE — Telephone Encounter (Signed)
[facility] FYI   Caller states she tested pos for covid today, has coughing, bad headache, runny nose, back hurts, and fever 100.2. symp started last night  Advised to call PCP within 24 hrs. On-Call provider contacted them and prescribed a 5 day order of molupiravir '500mg'$  sent to [facility].

## 2021-06-24 NOTE — Progress Notes (Signed)
Virtual telephone visit    Virtual Visit via Telephone Note   This visit type was conducted due to national recommendations for restrictions regarding the COVID-19 Pandemic (e.g. social distancing) in an effort to limit this patient's exposure and mitigate transmission in our community. Due to her co-morbid illnesses, this patient is at least at moderate risk for complications without adequate follow up. This format is felt to be most appropriate for this patient at this time. The patient did not have access to video technology or had technical difficulties with video requiring transitioning to audio format only (telephone). Physical exam was limited to content and character of the telephone converstion. CMA was able to get the patient set up on a telephone visit.   Patient location: Home. Patient and provider in visit Provider location: Office  I discussed the limitations of evaluation and management by telemedicine and the availability of in person appointments. The patient expressed understanding and agreed to proceed.   Visit Date: 06/24/2021  Today's healthcare provider: Nance Pear, NP     Subjective:    Patient ID: Abigail Collins, female    DOB: 1950/11/15, 70 y.o.   MRN: [id number]  Chief Complaint  Patient presents with   Covid Positive    Tested positive 06-22-21   Covid symptoms    Patient reports symptoms started 06-21-2021. Nasal congestion, body aches, fatigue and fever 100.2 on 9-10.     HPI  Started feeling badly Friday evening.  Initial symptoms was a "severe headache." Then developed body aches, nasal congestion, coughing, fatigue/weakness.  She denies SOB. She had pain in her chest between her breasts at 3AM. Pain lasted only "a few seconds".   It did not wake her up.  No pain since.  She reports 2 pfizer shots and a booster shot.    Past Medical History:  Diagnosis Date   Anemia    hx of   Anginal pain ([place])    "left arm pain, sees Dr.  Etter Sjogren, had card cath 2013"   Arthritis    "all over" (09/14/2012)   Asthma    takes inhaler 2x day   Bronchitis    hx of   Bulging disc    "lower"   Carpal tunnel syndrome of left wrist    Depression    denies   Exertional dyspnea    "sometimes laying down" (09/14/2012)   Headache    frequent headaches,usually if she does not eat   Hyperlipidemia    Hypertension    sees Dr. Debby Freiberg, primary   Pneumonia 03/2012   PONV (postoperative nausea and vomiting)    Thyroid disease 1960's   "don't have it now" (09/14/2012)   Type II diabetes mellitus ([place])    Urinary tract infection    hx of   Vomiting    pt states she vomits every am    Past Surgical History:  Procedure Laterality Date   CARDIAC CATHETERIZATION  05/13/2012   mod luminal irregularity of pLAD, no sign CAD, EF 65%.   CHOLECYSTECTOMY  1970's   KYPHOPLASTY N/A 02/10/2013   Procedure:  LUMBAR TWO KYPHOPLASTY;  Surgeon: Ophelia Charter, MD;  Location: [place];  Service: Neurosurgery;  Laterality: N/A;  L2 Kyphoplasty; Will use Stern's Carm.   ORIF HUMERUS FRACTURE Right 01/06/2018   Procedure: OPEN REDUCTION INTERNAL FIXATION (ORIF) RIGHT PROXIMAL HUMERUS FRACTURE;  Surgeon: Leandrew Koyanagi, MD;  Location: [facility];  Service: Orthopedics;  Laterality: Right;   TOTAL ELBOW  REPLACEMENT  ~ 2005   "right" (09/14/2012)   TOTAL KNEE ARTHROPLASTY  09/13/2012   Procedure: TOTAL KNEE ARTHROPLASTY;  Surgeon: Vickey Huger, MD;  Location: [place];  Service: Orthopedics;  Laterality: Right;   TOTAL KNEE ARTHROPLASTY Left 08/02/2018   Procedure: LEFT TOTAL KNEE ARTHROPLASTY;  Surgeon: Leandrew Koyanagi, MD;  Location: [facility];  Service: Orthopedics;  Laterality: Left;   TUBAL LIGATION  1970's   VAGINAL HYSTERECTOMY  21's    Family History  Problem Relation Age of Onset   Arthritis Mother    Arthritis Father    Hypertension Father    Diabetes Father    Colon cancer Neg Hx     Social History   Socioeconomic History   Marital  status: Married    Spouse name: Not on file   Number of children: Not on file   Years of education: Not on file   Highest education level: Not on file  Occupational History   Not on file  Tobacco Use   Smoking status: Former    Packs/day: 0.25    Years: 20.00    Pack years: 5.00    Types: Cigarettes    Quit date: 10/13/2009    Years since quitting: 11.7   Smokeless tobacco: Never  Vaping Use   Vaping Use: Never used  Substance and Sexual Activity   Alcohol use: No    Comment: 09/14/2012 "did drink a little in my younger days"   Drug use: No   Sexual activity: Not Currently  Other Topics Concern   Not on file  Social History Narrative   Not on file   Social Determinants of Health   Financial Resource Strain: Not on file  Food Insecurity: Not on file  Transportation Needs: No Transportation Needs   Lack of Transportation (Medical): No   Lack of Transportation (Non-Medical): No  Physical Activity: Not on file  Stress: Not on file  Social Connections: Not on file  Intimate Partner Violence: Not on file    Outpatient Medications Prior to Visit  Medication Sig Dispense Refill   albuterol (PROVENTIL) (2.5 MG/3ML) 0.083% nebulizer solution Inhale 2.5 mg into the lungs every 6 (six) hours as needed for wheezing or shortness of breath. Reported on 01/17/2016     atorvastatin (LIPITOR) 20 MG tablet Take 1 tablet (20 mg total) by mouth daily. 120 tablet 1   Blood Glucose Monitoring Suppl (ONETOUCH VERIO IQ SYSTEM) W/DEVICE KIT 1 Act by Does not apply route 3 (three) times daily. 2 kit 0   dexlansoprazole (DEXILANT) 60 MG capsule Take 1 capsule (60 mg total) by mouth daily. 120 capsule 1   glucose blood (ONETOUCH VERIO) test strip TEST THREE TIMES DAILY 200 strip 2   indapamide (LOZOL) 1.25 MG tablet Take 1 tablet (1.25 mg total) by mouth daily. 90 tablet 0   Insulin Pen Needle (NOVOFINE) 32G X 6 MM MISC 1 Act by Does not apply route daily. 100 each 3   irbesartan (AVAPRO) 300 MG  tablet TAKE 1 TABLET(300 MG) BY MOUTH DAILY 90 tablet 1   metFORMIN (GLUCOPHAGE) 1000 MG tablet Take 1 tablet (1,000 mg total) by mouth 2 (two) times daily with a meal. 180 tablet 0   Semaglutide, 2 MG/DOSE, 8 MG/3ML SOPN Inject 2 mg as directed once a week. 9 mL 1   No facility-administered medications prior to visit.    Allergies  Allergen Reactions   Lisinopril Shortness Of Breath   Amlodipine Swelling    UNSPECIFIED EDEMA  Tape Other (See Comments)    Tore skin--?hypofix   Morphine And Related Nausea And Vomiting   Percocet [Oxycodone-Acetaminophen] Nausea And Vomiting    ROS See HPI    Objective:    Physical Exam  BP 112/77   Pulse (!) 112   Temp (!) 97.4 F (36.3 C)  Wt Readings from Last 3 Encounters:  05/23/21 218 lb (98.9 kg)  01/24/21 217 lb 9.6 oz (98.7 kg)  04/10/20 217 lb (98.4 kg)   Gen: Awake, alert ENT: mild voice hoarseness Psych: calm pleasant demeanor Neuro: A and O x 3.      Assessment & Plan:   Problem List Items Addressed This Visit       Unprioritized   Diabetes mellitus with neurological manifestations, uncontrolled ([place])    She has not been taking her DM meds.  Advised OK to continue as long as she is eating. Glucose today was 265.        COVID-19 - Primary    Patient has multiple co-morbidities including DM, COPD, Morbid obesity and HTN.  I am concerned about her tachycardia and report of dark yellow urine that she is dehydrated.  I am also concerned about the brief episode of chest pain she experienced overnight. I advised her that I think it would be best for her to go to the ER for IV fluids and further evaluation of her chest pain. She declines as her husband is also ill and she cannot go alone due to mobility issues.  She was agreeable to call 911 if she develops increased weakness, SOB, fever >101 or recurrent chest pain.  Will rx with molnupiravir bid x 5 days.   Advised of CDC guidelines for self isolation/ ending isolation.   Advised of safe practice guidelines. Symptom Tier reviewed.  Encouraged to monitor for any worsening symptoms; watch for increased shortness of breath, weakness, and signs of dehydration. Advised when to seek emergency care.  Instructed to rest and hydrate well. Advised to leave the house during recommended isolation period, only if it is necessary to seek medical care       Relevant Medications   molnupiravir EUA 200 MG CAPS   21 minutes spent on today's visit.   I am having Abigail Collins start on molnupiravir EUA. I am also having her maintain her OneTouch Verio IQ System, albuterol, OneTouch Verio, Insulin Pen Needle, irbesartan, atorvastatin, indapamide, metFORMIN, dexlansoprazole, and Semaglutide (2 MG/DOSE).  Meds ordered this encounter  Medications   molnupiravir EUA 200 MG CAPS    Sig: Take 4 capsules (800 mg total) by mouth in the morning and at bedtime for 5 days.    Dispense:  40 capsule    Refill:  0    Order Specific Question:   Supervising Provider    Answer:   Penni Homans A [[id number]]     I discussed the assessment and treatment plan with the patient. The patient was provided an opportunity to ask questions and all were answered. The patient agreed with the plan and demonstrated an understanding of the instructions.   The patient was advised to call back or seek an in-person evaluation if the symptoms worsen or if the condition fails to improve as anticipated.  Nance Pear, NP [employer] [facility] [employer] [phone] (phone) [phone] (fax)  [place]

## 2021-06-24 NOTE — Assessment & Plan Note (Signed)
She has not been taking her DM meds.  Advised OK to continue as long as she is eating. Glucose today was 265.

## 2021-06-24 NOTE — Assessment & Plan Note (Signed)
Patient has multiple co-morbidities including DM, COPD, Morbid obesity and HTN.  I am concerned about her tachycardia and report of dark yellow urine that she is dehydrated.  I am also concerned about the brief episode of chest pain she experienced overnight. I advised her that I think it would be best for her to go to the ER for IV fluids and further evaluation of her chest pain. She declines as her husband is also ill and she cannot go alone due to mobility issues.  She was agreeable to call 911 if she develops increased weakness, SOB, fever >101 or recurrent chest pain.  Will rx with molnupiravir bid x 5 days.   Advised of CDC guidelines for self isolation/ ending isolation.  Advised of safe practice guidelines. Symptom Tier reviewed.  Encouraged to monitor for any worsening symptoms; watch for increased shortness of breath, weakness, and signs of dehydration. Advised when to seek emergency care.  Instructed to rest and hydrate well. Advised to leave the house during recommended isolation period, only if it is necessary to seek medical care

## 2021-07-04 ENCOUNTER — Telehealth: Payer: Self-pay | Admitting: Internal Medicine

## 2021-07-04 NOTE — Telephone Encounter (Signed)
Pt has been informed.

## 2021-07-04 NOTE — Telephone Encounter (Signed)
Type of form received: handicapped placard  Form placed in: Provider mailbox  Additional instructions from the patient: mail  Things to remember: [place] office: If form received in person, remind patient that forms take 7-10 business days CMA should attach charge sheet and put on [employer]

## 2021-07-04 NOTE — Telephone Encounter (Signed)
Form has been completed and given to PCP for signature.  

## 2021-07-04 NOTE — Telephone Encounter (Signed)
Patient wants to know if opthomalogy referral can be sent to provider that can see her sooner  Patient says current referral office can not get her in until Februaury & she does not want to wait that long

## 2021-07-04 NOTE — Telephone Encounter (Signed)
Form has been signed.  Pt has been informed and is aware it is at the front desk for pick up.

## 2021-07-09 NOTE — Telephone Encounter (Signed)
Patient called requesting that her handicap form be mailed due to a cough she currently is experiencing.  Form mailed to: [address]                            [place], [place] [address]

## 2021-07-16 ENCOUNTER — Ambulatory Visit: Payer: HMO | Admitting: Dermatology

## 2021-08-21 ENCOUNTER — Telehealth: Payer: Self-pay | Admitting: *Deleted

## 2021-08-21 NOTE — Chronic Care Management (AMB) (Signed)
  Chronic Care Management   Note  08/21/2021 Name: Abigail Collins MRN: [id number] DOB: 06-29-1951  Abigail Collins is a 70 y.o. year old female who is a primary care patient of Ronnald Ramp Arvid Right, MD. Abigail Collins is currently enrolled in care management services. An additional referral for RNCM was placed.   [name] was given information about Chronic Care Management services today including:  CCM service includes personalized support from designated clinical staff supervised by her physician, including individualized plan of care and coordination with other care providers 24/7 contact phone numbers for assistance for urgent and routine care needs. Service will only be billed when office clinical staff spend 20 minutes or more in a month to coordinate care. Only one practitioner may furnish and bill the service in a calendar month. The patient may stop CCM services at any time (effective at the end of the month) by phone call to the office staff. The patient is responsible for co-pay (up to 20% after annual deductible is met) if co-pay is required by the individual health plan.   Patient agreed to services and verbal consent obtained.  Follow up plan: Telephone appointment with care management team member scheduled for: 08/22/21  [place] [facility]  Direct Dial: [phone]

## 2021-08-22 ENCOUNTER — Encounter: Payer: Self-pay | Admitting: *Deleted

## 2021-08-22 ENCOUNTER — Telehealth: Payer: HMO

## 2021-08-22 ENCOUNTER — Telehealth: Payer: Self-pay | Admitting: *Deleted

## 2021-08-22 NOTE — Telephone Encounter (Signed)
  Chronic Care Management   Follow Up Note   08/22/2021 Name: Abigail Collins MRN: [id number] DOB: 1951-03-31  Referred by: Janith Lima, MD Reason for referral : Chronic Care Management (CCM RN CM Initial Outreach, Unsuccessful attempt #1)  An unsuccessful telephone outreach was attempted today. The patient was referred to the case management team for assistance with care management and care coordination.   Follow Up Plan:  A HIPPA compliant phone message was left for the patient providing contact information and requesting a return call Will place request with scheduling care guide to contact patient to re-schedule today's missed CCM RN initial telephone appointment if I do not hear back from patient by end of day  Oneta Rack, RN, BSN, [place] [phone]: direct office [phone]: mobile

## 2021-08-23 ENCOUNTER — Telehealth: Payer: Self-pay | Admitting: *Deleted

## 2021-08-23 NOTE — Chronic Care Management (AMB) (Signed)
  Care Management   Note  08/23/2021 Name: Abigail Collins MRN: [id number] DOB: [birth year]  Abigail Collins is a 70 y.o. year old female who is a primary care patient of Janith Lima, MD and is actively engaged with the care management team. I reached out to Abigail Collins by phone today to assist with re-scheduling an initial visit with the RN Case Manager  Follow up plan: Unsuccessful telephone outreach attempt made. A HIPAA compliant phone message was left for the patient providing contact information and requesting a return call.  The care management team will reach out to the patient again over the next 7 days.  If patient returns call to provider office, please advise to call [place] at [phone].  [place] [facility]  Direct Dial: [phone]

## 2021-08-27 ENCOUNTER — Ambulatory Visit (INDEPENDENT_AMBULATORY_CARE_PROVIDER_SITE_OTHER): Payer: HMO | Admitting: Internal Medicine

## 2021-08-27 ENCOUNTER — Encounter: Payer: Self-pay | Admitting: Internal Medicine

## 2021-08-27 ENCOUNTER — Other Ambulatory Visit: Payer: Self-pay

## 2021-08-27 VITALS — BP 132/82 | HR 80 | Temp 98.2°F | Resp 16 | Ht 63.5 in | Wt 214.0 lb

## 2021-08-27 DIAGNOSIS — E118 Type 2 diabetes mellitus with unspecified complications: Secondary | ICD-10-CM | POA: Insufficient documentation

## 2021-08-27 DIAGNOSIS — I10 Essential (primary) hypertension: Secondary | ICD-10-CM | POA: Diagnosis not present

## 2021-08-27 DIAGNOSIS — Z23 Encounter for immunization: Secondary | ICD-10-CM

## 2021-08-27 NOTE — Patient Instructions (Signed)
Type 2 Diabetes Mellitus, Diagnosis, Adult ?Type 2 diabetes (type 2 diabetes mellitus) is a long-term, or chronic, disease. In type 2 diabetes, one or both of these problems may be present: ?The pancreas does not make enough of a hormone called insulin. ?Cells in the body do not respond properly to the insulin that the body makes (insulin resistance). ?Normally, insulin allows blood sugar (glucose) to enter cells in the body. The cells use glucose for energy. Insulin resistance or lack of insulin causes excess glucose to build up in the blood instead of going into cells. This causes high blood glucose (hyperglycemia).  ?What are the causes? ?The exact cause of type 2 diabetes is not known. ?What increases the risk? ?The following factors may make you more likely to develop this condition: ?Having a family member with type 2 diabetes. ?Being overweight or obese. ?Being inactive (sedentary). ?Having been diagnosed with insulin resistance. ?Having a history of prediabetes, diabetes when you were pregnant (gestational diabetes), or polycystic ovary syndrome (PCOS). ?What are the signs or symptoms? ?In the early stage of this condition, you may not have symptoms. Symptoms develop slowly and may include: ?Increased thirst or hunger. ?Increased urination. ?Unexplained weight loss. ?Tiredness (fatigue) or weakness. ?Vision changes, such as blurry vision. ?Dark patches on the skin. ?How is this diagnosed? ?This condition is diagnosed based on your symptoms, your medical history, a physical exam, and your blood glucose level. Your blood glucose may be checked with one or more of the following blood tests: ?A fasting blood glucose (FBG) test. You will not be allowed to eat (you will fast) for 8 hours or longer before a blood sample is taken. ?A random blood glucose test. This test checks blood glucose at any time of day regardless of when you ate. ?An A1C (hemoglobin A1C) blood test. This test provides information about blood  glucose levels over the previous 2-3 months. ?An oral glucose tolerance test (OGTT). This test measures your blood glucose at two times: ?After fasting. This is your baseline blood glucose level. ?Two hours after drinking a beverage that contains glucose. ?You may be diagnosed with type 2 diabetes if: ?Your fasting blood glucose level is 126 mg/dL (7.0 mmol/L) or higher. ?Your random blood glucose level is 200 mg/dL (11.1 mmol/L) or higher. ?Your A1C level is 6.5% or higher. ?Your oral glucose tolerance test result is higher than 200 mg/dL (11.1 mmol/L). ?These blood tests may be repeated to confirm your diagnosis. ?How is this treated? ?Your treatment may be managed by a specialist called an endocrinologist. Type 2 diabetes may be treated by following instructions from your health care provider about: ?Making dietary and lifestyle changes. These may include: ?Following a personalized nutrition plan that is developed by a registered dietitian. ?Exercising regularly. ?Finding ways to manage stress. ?Checking your blood glucose level as often as told. ?Taking diabetes medicines or insulin daily. This helps to keep your blood glucose levels in the healthy range. ?Taking medicines to help prevent complications from diabetes. Medicines may include: ?Aspirin. ?Medicine to lower cholesterol. ?Medicine to control blood pressure. ?Your health care provider will set treatment goals for you. Your goals will be based on your age, other medical conditions you have, and how you respond to diabetes treatment. Generally, the goal of treatment is to maintain the following blood glucose levels: ?Before meals: 80-130 mg/dL (4.4-7.2 mmol/L). ?After meals: below 180 mg/dL (10 mmol/L). ?A1C level: less than 7%. ?Follow these instructions at home: ?Questions to ask your health care provider ?  Consider asking the following questions: ?Should I meet with a certified diabetes care and education specialist? ?What diabetes medicines do I need,  and when should I take them? ?What equipment will I need to manage my diabetes at home? ?How often do I need to check my blood glucose? ?Where can I find a support group for people with diabetes? ?What number can I call if I have questions? ?When is my next appointment? ?General instructions ?Take over-the-counter and prescription medicines only as told by your health care provider. ?Keep all follow-up visits. This is important. ?Where to find more information ?For help and guidance and for more information about diabetes, please visit: ?American Diabetes Association (ADA): www.diabetes.org ?American Association of Diabetes Care and Education Specialists (ADCES): www.diabeteseducator.org ?International Diabetes Federation (IDF): www.idf.org ?Contact a health care provider if: ?Your blood glucose is at or above 240 mg/dL (13.3 mmol/L) for 2 days in a row. ?You have been sick or have had a fever for 2 days or longer, and you are not getting better. ?You have any of the following problems for more than 6 hours: ?You cannot eat or drink. ?You have nausea and vomiting. ?You have diarrhea. ?Get help right away if: ?You have severe hypoglycemia. This means your blood glucose is lower than 54 mg/dL (3.0 mmol/L). ?You become confused or you have trouble thinking clearly. ?You have difficulty breathing. ?You have moderate or large ketone levels in your urine. ?These symptoms may represent a serious problem that is an emergency. Do not wait to see if the symptoms will go away. Get medical help right away. Call your local emergency services (911 in the [place] Do not drive yourself to the hospital. ?Summary ?Type 2 diabetes mellitus is a long-term, or chronic, disease. In type 2 diabetes, the pancreas does not make enough of a hormone called insulin, or cells in the body do not respond properly to insulin that the body makes. ?This condition is treated by making dietary and lifestyle changes and taking diabetes medicines or  insulin. ?Your health care provider will set treatment goals for you. Your goals will be based on your age, other medical conditions you have, and how you respond to diabetes treatment. ?Keep all follow-up visits. This is important. ?This information is not intended to replace advice given to you by your health care provider. Make sure you discuss any questions you have with your health care provider. ?Document Revised: 12/24/2020 Document Reviewed: 12/24/2020 ?Elsevier Patient Education ? 2022 Elsevier Inc. ? ?

## 2021-08-27 NOTE — Progress Notes (Signed)
Subjective:  Patient ID: Abigail Collins, female    DOB: 02-May-1951  Age: 70 y.o. MRN: [id number]  CC: Hypertension and Diabetes  This visit occurred during the SARS-CoV-2 public health emergency.  Safety protocols were in place, including screening questions prior to the visit, additional usage of staff PPE, and extensive cleaning of exam room while observing appropriate contact time as indicated for disinfecting solutions.    HPI Abigail Collins presents for f/up -  She is losing weight with lifestyle modifications. She is active and denies DOE, CP, edema, fatigue.  Outpatient Medications Prior to Visit  Medication Sig Dispense Refill   albuterol (PROVENTIL) (2.5 MG/3ML) 0.083% nebulizer solution Inhale 2.5 mg into the lungs every 6 (six) hours as needed for wheezing or shortness of breath. Reported on 01/17/2016     atorvastatin (LIPITOR) 20 MG tablet Take 1 tablet (20 mg total) by mouth daily. 120 tablet 1   Blood Glucose Monitoring Suppl (ONETOUCH VERIO IQ SYSTEM) W/DEVICE KIT 1 Act by Does not apply route 3 (three) times daily. 2 kit 0   dexlansoprazole (DEXILANT) 60 MG capsule Take 1 capsule (60 mg total) by mouth daily. 120 capsule 1   glucose blood (ONETOUCH VERIO) test strip TEST THREE TIMES DAILY 200 strip 2   indapamide (LOZOL) 1.25 MG tablet Take 1 tablet (1.25 mg total) by mouth daily. 90 tablet 0   Insulin Pen Needle (NOVOFINE) 32G X 6 MM MISC 1 Act by Does not apply route daily. 100 each 3   irbesartan (AVAPRO) 300 MG tablet TAKE 1 TABLET(300 MG) BY MOUTH DAILY 90 tablet 1   metFORMIN (GLUCOPHAGE) 1000 MG tablet Take 1 tablet (1,000 mg total) by mouth 2 (two) times daily with a meal. 180 tablet 0   Semaglutide, 2 MG/DOSE, 8 MG/3ML SOPN Inject 2 mg as directed once a week. 9 mL 1   No facility-administered medications prior to visit.    ROS Review of Systems  Constitutional:  Negative for diaphoresis, fatigue and unexpected weight change.  HENT: Negative.    Eyes:  Negative.   Respiratory:  Negative for cough, chest tightness and wheezing.   Cardiovascular:  Negative for chest pain, palpitations and leg swelling.  Gastrointestinal:  Negative for abdominal pain, constipation, diarrhea, nausea and vomiting.  Endocrine: Negative.   Genitourinary: Negative.  Negative for difficulty urinating.  Skin: Negative.   Neurological:  Negative for dizziness, weakness, light-headedness and numbness.  Hematological:  Negative for adenopathy. Does not bruise/bleed easily.  Psychiatric/Behavioral: Negative.     Objective:  BP 132/82 (BP Location: Left Arm, Patient Position: Sitting, Cuff Size: Large)   Pulse 80   Temp 98.2 F (36.8 C) (Oral)   Resp 16   Ht 5' 3.5" (1.613 m)   Wt 214 lb (97.1 kg)   SpO2 97%   BMI 37.31 kg/m   BP Readings from Last 3 Encounters:  08/27/21 132/82  06/24/21 112/77  05/23/21 (!) 156/98    Wt Readings from Last 3 Encounters:  08/27/21 214 lb (97.1 kg)  05/23/21 218 lb (98.9 kg)  01/24/21 217 lb 9.6 oz (98.7 kg)    Physical Exam Vitals reviewed.  HENT:     Nose: Nose normal.     Mouth/Throat:     Mouth: Mucous membranes are moist.  Eyes:     General: No scleral icterus.    Conjunctiva/sclera: Conjunctivae normal.  Cardiovascular:     Rate and Rhythm: Normal rate and regular rhythm.     Heart sounds:  No murmur heard. Pulmonary:     Effort: Pulmonary effort is normal.     Breath sounds: No stridor. No wheezing, rhonchi or rales.  Abdominal:     General: Abdomen is flat.     Palpations: There is no mass.     Tenderness: There is no abdominal tenderness. There is no guarding.     Hernia: No hernia is present.  Musculoskeletal:        General: Normal range of motion.     Cervical back: Neck supple.     Right lower leg: No edema.     Left lower leg: No edema.  Skin:    General: Skin is warm and dry.  Neurological:     General: No focal deficit present.     Mental Status: She is alert.  Psychiatric:         Mood and Affect: Mood normal.        Behavior: Behavior normal.    Lab Results  Component Value Date   WBC 6.8 05/23/2021   HGB 13.2 05/23/2021   HCT 40.5 05/23/2021   PLT 175.0 05/23/2021   GLUCOSE 106 (H) 05/23/2021   CHOL 169 01/24/2021   TRIG 219.0 (H) 01/24/2021   HDL 55.30 01/24/2021   LDLDIRECT 82.0 01/24/2021   LDLCALC UNABLE TO CALCULATE IF TRIGLYCERIDE OVER 400 mg/dL 04/06/2012   ALT 15 01/24/2021   AST 19 01/24/2021   NA 141 05/23/2021   K 4.2 05/23/2021   CL 105 05/23/2021   CREATININE 0.84 05/23/2021   BUN 19 05/23/2021   CO2 29 05/23/2021   TSH 2.52 01/24/2021   INR 0.91 07/26/2018   HGBA1C 7.7 (H) 05/23/2021   MICROALBUR 0.9 05/23/2021    VAS [place] LOWER EXTREMITY VENOUS (DVT)  Result Date: 11/30/2018  Lower Venous Study Indications: Pain.  Performing Technologist: Abram Sander RVS  Examination Guidelines: A complete evaluation includes B-mode imaging, spectral Doppler, color Doppler, and power Doppler as needed of all accessible portions of each vessel. Bilateral testing is considered an integral part of a complete examination. Limited examinations for reoccurring indications may be performed as noted.  Right Venous Findings: +---+---------------+---------+-----------+----------+-------+    CompressibilityPhasicitySpontaneityPropertiesSummary +---+---------------+---------+-----------+----------+-------+ CFVFull           Yes      Yes                          +---+---------------+---------+-----------+----------+-------+  Left Venous Findings: +---------+---------------+---------+-----------+----------+--------------+          CompressibilityPhasicitySpontaneityPropertiesSummary        +---------+---------------+---------+-----------+----------+--------------+ CFV      Full           Yes      Yes                                 +---------+---------------+---------+-----------+----------+--------------+ SFJ      Full                                                         +---------+---------------+---------+-----------+----------+--------------+ FV Prox  Full                                                        +---------+---------------+---------+-----------+----------+--------------+  FV Mid   Full                                                        +---------+---------------+---------+-----------+----------+--------------+ FV DistalFull                                                        +---------+---------------+---------+-----------+----------+--------------+ PFV      Full                                                        +---------+---------------+---------+-----------+----------+--------------+ POP      Full           Yes      Yes                                 +---------+---------------+---------+-----------+----------+--------------+ PTV      Full                                                        +---------+---------------+---------+-----------+----------+--------------+ PERO                                                  Not visualized +---------+---------------+---------+-----------+----------+--------------+    Summary: Right: No evidence of common femoral vein obstruction. Left: There is no evidence of deep vein thrombosis in the lower extremity. No cystic structure found in the popliteal fossa.  *See table(s) above for measurements and observations. Electronically signed by Deitra Mayo MD on 11/30/2018 at 2:49:43 PM.    Final     Assessment & Plan:   Abigail Collins was seen today for hypertension and diabetes.  Diagnoses and all orders for this visit:  HTN (hypertension), benign- Her BP is well controlled. Will continue the current meds.  Type II diabetes mellitus with manifestations ([place])- She was praised for her lifestyle modifications. Will continue the current meds.  Flu vaccine need -     Flu Vaccine QUAD High Dose(Fluad)  I am having Abigail Collins maintain her OneTouch Verio IQ System, albuterol, OneTouch Verio, Insulin Pen Needle, irbesartan, atorvastatin, indapamide, metFORMIN, dexlansoprazole, and Semaglutide (2 MG/DOSE).  No orders of the defined types were placed in this encounter.    Follow-up: Return in about 3 months (around 11/27/2021).  Scarlette Calico, MD

## 2021-08-28 ENCOUNTER — Telehealth: Payer: HMO

## 2021-08-30 NOTE — Chronic Care Management (AMB) (Signed)
  Care Management   Note  08/30/2021 Name: Abigail Collins MRN: [id number] DOB: 01-Jul-1951  Abigail Collins is a 70 y.o. year old female who is a primary care patient of Janith Lima, MD and is actively engaged with the care management team. I reached out to Abigail Collins by phone today to assist with re-scheduling an initial visit with the RN Case Manager  Follow up plan: Telephone appointment with care management team member scheduled for:09/10/21  [place] [facility]  Direct Dial: [phone]

## 2021-09-10 ENCOUNTER — Telehealth: Payer: HMO

## 2021-09-10 ENCOUNTER — Telehealth: Payer: Self-pay | Admitting: *Deleted

## 2021-09-10 ENCOUNTER — Encounter: Payer: Self-pay | Admitting: *Deleted

## 2021-09-10 NOTE — Telephone Encounter (Signed)
  Chronic Care Management   Follow Up Note   09/10/2021 Name: Abigail Collins MRN: [id number] DOB: 03-Jan-1951  Referred by: Janith Lima, MD Reason for referral : Chronic Care Management (CCM RN CM Initial Outreach- Unsuccessful second outreach attempt)  A second unsuccessful telephone outreach was attempted today. The patient was referred to the case management team for assistance with care management and care coordination.   Follow Up Plan:  A HIPPA compliant phone message was left for the patient providing contact information and requesting a return call Will place request with scheduling care guide to contact patient to re-schedule today's missed CCM RN initial telephone appointment if I do not hear back from patient by end of day  Oneta Rack, RN, BSN, [place] [phone]: direct office [phone]: mobile

## 2021-09-12 ENCOUNTER — Telehealth: Payer: Self-pay | Admitting: *Deleted

## 2021-09-12 ENCOUNTER — Other Ambulatory Visit: Payer: Self-pay

## 2021-09-12 ENCOUNTER — Ambulatory Visit: Payer: HMO | Admitting: Dermatology

## 2021-09-12 DIAGNOSIS — D0461 Carcinoma in situ of skin of right upper limb, including shoulder: Secondary | ICD-10-CM

## 2021-09-12 DIAGNOSIS — Z1283 Encounter for screening for malignant neoplasm of skin: Secondary | ICD-10-CM | POA: Diagnosis not present

## 2021-09-12 DIAGNOSIS — L570 Actinic keratosis: Secondary | ICD-10-CM | POA: Diagnosis not present

## 2021-09-12 DIAGNOSIS — L821 Other seborrheic keratosis: Secondary | ICD-10-CM | POA: Diagnosis not present

## 2021-09-12 DIAGNOSIS — D485 Neoplasm of uncertain behavior of skin: Secondary | ICD-10-CM

## 2021-09-12 NOTE — Patient Instructions (Signed)

## 2021-09-12 NOTE — Chronic Care Management (AMB) (Signed)
  Care Management   Note  09/12/2021 Name: Abigail Collins MRN: [id number] DOB: 21-Aug-1951  Abigail Collins is a 70 y.o. year old female who is a primary care patient of Janith Lima, MD and is actively engaged with the care management team. I reached out to Abigail Collins by phone today to assist with re-scheduling an initial visit with the RN Case Manager  Follow up plan: Unsuccessful telephone outreach attempt made. A HIPAA compliant phone message was left for the patient providing contact information and requesting a return call. The care management team will reach out to the patient again over the next 7 days.  If patient returns call to provider office, please advise to call [place] at [phone].  [place] [facility]  Direct Dial: [phone]

## 2021-09-18 ENCOUNTER — Telehealth: Payer: Self-pay | Admitting: Dermatology

## 2021-09-18 NOTE — Telephone Encounter (Signed)
Patient is calling for pathology results from last visit with Stuart Tafeen, MD 

## 2021-09-18 NOTE — Telephone Encounter (Signed)
Phone call to patient for pathology results. No answer, mailbox is full.

## 2021-09-19 NOTE — Telephone Encounter (Signed)
Path to patient and surgery date given

## 2021-09-19 NOTE — Chronic Care Management (AMB) (Signed)
  Care Management   Note  09/19/2021 Name: Abigail Collins MRN: [id number] DOB: 12-02-1950  Abigail Collins is a 70 y.o. year old female who is a primary care patient of Janith Lima, MD and is actively engaged with the care management team. I reached out to Abigail Collins by phone today to assist with re-scheduling an initial visit with the RN Case Manager  Follow up plan: Telephone appointment with care management team member scheduled for:09/27/21  [place] [facility]  Direct Dial: [phone]

## 2021-09-27 ENCOUNTER — Ambulatory Visit (INDEPENDENT_AMBULATORY_CARE_PROVIDER_SITE_OTHER): Payer: HMO | Admitting: *Deleted

## 2021-09-27 DIAGNOSIS — I10 Essential (primary) hypertension: Secondary | ICD-10-CM

## 2021-09-27 DIAGNOSIS — E118 Type 2 diabetes mellitus with unspecified complications: Secondary | ICD-10-CM

## 2021-09-27 NOTE — Patient Instructions (Addendum)
Visit Information   Abigail Collins, thank you for taking time to talk with me today. Please don't hesitate to contact me if I can be of assistance to you before our next scheduled telephone appointment.  Below are the goals we discussed today:  Patient Self-Care Activities: Patient Abigail Collins  will: Take medications as prescribed Attend all scheduled provider appointments Call pharmacy for medication refills Call provider office for new concerns or questions Continue to check fasting (first thing in the morning, before eating) blood sugars at home, as well as blood sugars 2 hours after eating a regular size meal-- please write down your blood sugars on paper so we can review these during our future visits Follow heart healthy, low salt, low cholesterol, carbohydrate-modified, low sugar diet Please try to learn how to check your voice mail messages on your phone- it appears that the GI doctor has been trying to contact you to schedule an appointment Please listen out for a call from the [place] team at Dr. Ronnald Ramp' office to discuss patient assistance with your medication costs and a call from the [place] team to provide community resources that may be available to you for dental care and food acquisition  Review enclosed educational material   Our next scheduled telephone follow up visit/ appointment with care management team member is scheduled on:  Thursday, October 17, 2021 at 10:00 am  If you need to cancel or re-schedule our visit, please call [phone] and our care guide team will be happy to assist you.   I look forward to hearing about your progress.   Oneta Rack, RN, BSN, [place] [phone]: direct office  If you are experiencing a Mental Health or [place] or need someone to talk to, please  call the Suicide and Crisis Lifeline: 988 call the [place] National Suicide Prevention  Lifeline: 714-673-8224 or TTY: 339 799 2177 TTY 304-088-4286) to talk to a trained counselor call 1-800-273-TALK (toll free, 24 hour hotline) go to [facility] [address], [place] [phone]) call 46   Living With Diabetes Diabetes (type 1 diabetes mellitus or type 2 diabetes mellitus) is a condition in which the body does not have enough of a hormone called insulin, or the body does not respond properly to insulin. Normally, insulin allows sugars (glucose) to enter cells in the body. With diabetes, extra glucose builds up in the blood instead of going into cells. This results in high blood glucose (hyperglycemia). How to manage lifestyle changes Managing diabetes includes medical treatments as well as lifestyle changes. If diabetes is not managed well, serious physical and emotional complications can occur. Taking good care of yourself means that you are responsible for: Monitoring glucose regularly. Eating a healthy diet. Exercising regularly. Meeting with health care providers. Taking medicines as directed. Most people feel some stress about managing their diabetes. When this stress becomes too much, it is known as diabetes-related distress. This is very common. Living with diabetes can place you at risk for diabetes distress, depression, or anxiety. These disorders can make diabetes more difficult to manage. How to recognize stress You may have diabetes distress if you: Avoid or ignore your daily diabetes care. This includes glucose testing, following a meal plan, and taking medications. Feel overwhelmed by your daily diabetes care. Experience emotional reactions such as anger, sadness, or fear related to your daily diabetes care. Feel fear or shame about not doing everything perfectly  that you have been told to do. Emotional distress Symptoms of diabetes distress include: Anger about having a diagnosis of diabetes. Fear or frustration  about your diagnosis and the changes you need to make to manage the condition. Being overly worried about the care that you need or the cost of the care that you need. Feeling like you caused your condition by doing something wrong. Fear about unpredictable fluctuations in your blood glucose, like low or high blood glucose. Feeling judged by your health care providers. Feeling very alone with the disease. Depression Having diabetes means that you are at a higher risk for depression. Your health care provider may test (screen) you for symptoms of depression. It is important to recognize symptoms and to start treatment for depression soon after it is diagnosed. The following are some symptoms of depression: Loss of interest in things that you used to enjoy. Feeling depressed much or most of the time. A change in appetite. Trouble getting to sleep or staying asleep. Feeling tired most of the day. Feeling nervous and anxious. Feeling guilty and worrying that you are a burden to others. Having thoughts of hurting yourself or feeling that you want to die. If you have any of these symptoms, more days than not, for 2 weeks or longer, you may have depression. This would be a good time to contact your health care provider. Follow these instructions at home: Managing diabetes distress The following are some ways to manage emotional distress: Learn as much as you can about diabetes and its treatment. Take one step at a time to improve your management. Meet with a certified diabetes care and education specialist. Take a class to learn how to manage your condition. Consider working with a counselor or therapist. Keep a journal of your thoughts and concerns. Accept that some things are out of your control. Talk with other people who have diabetes. It can help to talk about the distress that you feel. Find ways to manage stress that work for you. These may include art or music therapy, exercise,  meditation, and hobbies. Seek support from spiritual leaders, family, and friends.  General instructions Do your best to follow your diabetes management plan. If you are struggling to follow your plan, talk with a certified diabetes care and education specialist, or with someone else who has diabetes. They may have ideas that will help. Forgive yourself for not being perfect. Almost everyone struggles with the tasks of diabetes. Keep all follow-up visits. This is important. Where to find support Search for information and support from the American Diabetes Association: www.diabetes.org Find a certified diabetes education and care specialist. Make an appointment through the Association of Diabetes Care & Education Specialists: www.diabeteseducator.org Contact a health care provider if: You believe your diabetes is getting out of control. You are concerned you may be depressed. You think your medications are not helping control your diabetes. You are feeling overwhelmed with your diabetes. Get help right away if: You have thoughts about hurting yourself or others. If you ever feel like you may hurt yourself or others, or have thoughts about taking your own life, get help right away. You can go to your nearest emergency department or call: Your local emergency services (911 in the [place] A suicide crisis helpline, such as the [facility] at 517-749-0617 or 988 in the [place]. This is open 24 hours a day. Summary Diabetes (type 1 diabetes mellitus or type 2 diabetes mellitus) is a condition in which the  body does not have enough of a hormone called insulin, or the body does not respond properly to insulin. Living with diabetes puts you at risk for medical and emotional issues, such as diabetes distress, depression, and anxiety. Recognizing the symptoms of diabetes distress and depression may help you avoid problems with your diabetes control. If you experience symptoms, it  is important to discuss this with your health care provider, certified diabetes care and education specialist, or therapist. It is important to start treatment for diabetes distress and depression soon after diagnosis. Ask your health care provider to recommend a therapist who understands both depression and diabetes. This information is not intended to replace advice given to you by your health care provider. Make sure you discuss any questions you have with your health care provider. Document Revised: 04/24/2021 Document Reviewed: 02/09/2020 Elsevier Patient Education  2022 [employer].    Below is a copy of your full care plan:  Care Plan : RN Care Manager Plan of Care  Updates made by Knox Royalty, RN since 09/27/2021 12:00 AM     Problem: Chronic Disease Management Needs   Priority: High     Long-Range Goal: Development of Plan of Care for long term chronic disease management   Start Date: 09/27/2021  Expected End Date: 09/27/2022  Priority: High  Note:   Current Barriers:  Chronic Disease Management support and education needs related to HTN and DMII Issues with phone: unable to retrieve voice messages; reports phone is unreliable; can not afford new phone Issues with swallowing- GI referral pending Financial issues in affording medications: can not afford prescribed Ozempic nor Dexilant: CCM Pharmacy referral placed today, 09/27/21 Food insecurity: currently uses established food bank; could benefit from additional resources: [employer] referral placed 09/27/21 Dental issues: insurance does not cover dental services: [facility] referral placed 09/27/21  RNCM Clinical Goal(s):  Patient will demonstrate ongoing health management independence as evidenced by adherence to plan of care for DMII; HTN        through collaboration with RN Care manager, provider, and care team.   Interventions: 1:1 collaboration with primary care provider  regarding development and update of comprehensive plan of care as evidenced by provider attestation and co-signature Inter-disciplinary care team collaboration (see longitudinal plan of care) Evaluation of current treatment plan related to  self management and patient's adherence to plan as established by provider Initial assessment initiated 09/27/21  Diabetes:  (Status: New goal.) Long Term Goal   Lab Results  Component Value Date   HGBA1C 7.7 (H) 05/23/2021  Assessed patient's understanding of A1c goal: <7% Provided education to patient about basic DM disease process; Reviewed medications with patient and discussed importance of medication adherence;        Reviewed prescribed diet with patient carb modified, low sugar, low salt, low cholesterol; Counseled on importance of regular laboratory monitoring as prescribed;        Discussed plans with patient for ongoing care management follow up and provided patient with direct contact information for care management team;      Reviewed scheduled/upcoming provider appointments including: 11/14/21- dermatology; 11/28/21- PCP;         Referral made to pharmacy team for assistance with PAP for Ozempic and Dexilant;       Referral made to community resources care guide team for assistance with dental resources/ food insecurity;      Review of patient status, including review of consultants reports, relevant laboratory and other  test results, and medications completed;       Assessed social determinant of health barriers;        Confirmed patient independently self-manages medications Assessed patient's understanding of meaning/ significance of A1-C values: no baseline understanding of same- education provided today around what the A1-C measures Reviewed individual historical A1-C trends and provided education around correlation of A1-C value to blood sugar levels at home over 3 months Reviewed recent fasting blood sugars at home: she reports  consistent values between 106-230; reports fasting value this morning of "170" Confirms she is taking metformin as prescribed: unable to take prescribed dose of Ozempic: states she is in the donut hole and samples provided by PCP at previous office visit "were only 0.5 mg;" states she has been taking samples q week, soon to run out; CCM Pharmacy referral placed Discussed issues with swallowing: states solid food gets hung in lower esophagus; reports PCP placed Gi referral "months ago;" states she has not heard from anyone yet; this continues to bother her, and causes her to vomit "foamy liquid" when she tries to pass food through esophagus; upon review of EHR- it appears GI referral was placed 05/23/21 and that GI provider has made attempts to contact patient on 08/15/21 and 09/16/21- notes indicate voice messages left for patient; patient reports she is unable to access her voice mail box; I will ask referral coordinator which practice GI referral was placed with; encouraged patient to call phone service provider to learn how to check her voice mail  Hypertension: (Status: New goal.)  LONG term Goal Last practice recorded BP readings:  BP Readings from Last 3 Encounters:  08/27/21 132/82  06/24/21 112/77  05/23/21 (!) 156/98  Most recent eGFR/CrCl: No results found for: EGFR  No components found for: CRCL  Evaluation of current treatment plan related to hypertension self management and patient's adherence to plan as established by provider;   Reviewed medications with patient and discussed importance of compliance;  Discussed complications of poorly controlled blood pressure such as heart disease, stroke, circulatory complications, vision complications, kidney impairment, sexual dysfunction;  Confirmed patient occasionally monitors blood pressures at home; does not record: encouraged to check BP weekly and write down on paper for our future review together  Patient Goals/Self-Care Activities: As  evidenced by review of EHR, collaboration with care team, and patient reporting during CCM RN CM outreach, Patient Abree  will: Take medications as prescribed Attend all scheduled provider appointments Call pharmacy for medication refills Call provider office for new concerns or questions Continue to check fasting (first thing in the morning, before eating) blood sugars at home, as well as blood sugars 2 hours after eating a regular size meal-- please write down your blood sugars on paper so we can review these during our future visits Follow heart healthy, low salt, low cholesterol, carbohydrate-modified, low sugar diet Please try to learn how to check your voice mail messages on your phone- it appears that the GI doctor has been trying to contact you to schedule an appointment Review enclosed educational material       Consent to CCM Services: Ms. Gethers was given information about Chronic Care Management services 08/21/21 including:  CCM service includes personalized support from designated clinical staff supervised by her physician, including individualized plan of care and coordination with other care providers 24/7 contact phone numbers for assistance for urgent and routine care needs. Service will only be billed when office clinical staff spend 20 minutes or more in a  month to coordinate care. Only one practitioner may furnish and bill the service in a calendar month. The patient may stop CCM services at any time (effective at the end of the month) by phone call to the office staff. The patient will be responsible for cost sharing (co-pay) of up to 20% of the service fee (after annual deductible is met).  Patient agreed to services and verbal consent obtained.   The patient verbalized understanding of instructions, educational materials, and care plan provided today and agreed to receive a mailed copy of patient instructions, educational materials, and care plan.  Telephone follow  up appointment with care management team member scheduled for:  Thursday, October 17, 2021 at 10:00 am The patient has been provided with contact information for the care management team and has been advised to call with any health related questions or concerns

## 2021-09-27 NOTE — Chronic Care Management (AMB) (Signed)
Chronic Care Management   CCM RN Visit Note  09/27/2021 Name: Abigail Collins MRN: [id number] DOB: 03/23/1951  Subjective: Abigail Collins is a 70 y.o. year old female who is a primary care patient of Janith Lima, MD. The care management team was consulted for assistance with disease management and care coordination needs.    Engaged with patient by telephone for initial visit in response to provider referral for case management and/or care coordination services.   Consent to Services:  The patient was given information about Chronic Care Management services, agreed to services, and gave verbal consent 08/21/21 prior to initiation of services.  Please see initial visit note for detailed documentation.  Patient agreed to services and verbal consent obtained.   Assessment: Review of patient past medical history, allergies, medications, health status, including review of consultants reports, laboratory and other test data, was performed as part of comprehensive evaluation and provision of chronic care management services.   SDOH (Social Determinants of Health) assessments and interventions performed:  SDOH Interventions    Flowsheet Row Most Recent Value  SDOH Interventions   Food Insecurity Interventions Intervention Not Indicated  [Reports established with food banks x one year- community resource care guide referral placed to provide additional resources that may be available,  does not get food stamps]  Housing Interventions Intervention Not Indicated  [Single Family Home x 25 years,  rental home,  denies safety concerns]  Transportation Interventions Intervention Not Indicated  [Patient drives "a little bit, " husband provides transportation,  has handicap placard]       CCM Care Plan Allergies  Allergen Reactions   Lisinopril Shortness Of Breath   Amlodipine Swelling    UNSPECIFIED EDEMA   Tape Other (See Comments)    Tore skin--?hypofix   Morphine And Related Nausea  And Vomiting   Percocet [Oxycodone-Acetaminophen] Nausea And Vomiting   Outpatient Encounter Medications as of 09/27/2021  Medication Sig Note   albuterol (PROVENTIL) (2.5 MG/3ML) 0.083% nebulizer solution Inhale 2.5 mg into the lungs every 6 (six) hours as needed for wheezing or shortness of breath. Reported on 01/17/2016    atorvastatin (LIPITOR) 20 MG tablet Take 1 tablet (20 mg total) by mouth daily.    Blood Glucose Monitoring Suppl (ONETOUCH VERIO IQ SYSTEM) W/DEVICE KIT 1 Act by Does not apply route 3 (three) times daily.    glucose blood (ONETOUCH VERIO) test strip TEST THREE TIMES DAILY    indapamide (LOZOL) 1.25 MG tablet Take 1 tablet (1.25 mg total) by mouth daily.    irbesartan (AVAPRO) 300 MG tablet TAKE 1 TABLET(300 MG) BY MOUTH DAILY    metFORMIN (GLUCOPHAGE) 1000 MG tablet Take 1 tablet (1,000 mg total) by mouth 2 (two) times daily with a meal.    Semaglutide, 2 MG/DOSE, 8 MG/3ML SOPN Inject 2 mg as directed once a week. 09/27/2021: 09/27/21- reports not taking, can not afford: has recently taken 0.5 mg q week (samples provided by PCP)- no more samples; reports in "donut hole"   dexlansoprazole (DEXILANT) 60 MG capsule Take 1 capsule (60 mg total) by mouth daily. (Patient not taking: Reported on 09/27/2021) 09/27/2021: 09/27/21- reports not taking, can not afford   Insulin Pen Needle (NOVOFINE) 32G X 6 MM MISC 1 Act by Does not apply route daily. (Patient not taking: Reported on 09/27/2021)    No facility-administered encounter medications on file as of 09/27/2021.   Patient Active Problem List   Diagnosis Date Noted   Type II diabetes mellitus with  manifestations ([place]) 08/27/2021   Esophageal dysphagia 05/23/2021   Gastroesophageal reflux disease with esophagitis without hemorrhage 05/23/2021   Encounter for general adult medical examination with abnormal findings 05/23/2021   Age-related osteoporosis without current pathological fracture 04/12/2019   OAB (overactive bladder)  04/12/2019   Vitamin D deficiency disease 05/07/2018   Excessive daytime sleepiness 05/27/2016   COPD mixed type ([place]) 01/09/2016   Visit for screening mammogram 03/28/2015   Depression with somatization 03/28/2015   Insomnia 03/28/2015   Primary osteoarthritis of both knees 03/28/2015   Allergic rhinitis 03/15/2014   Hyperlipidemia with target LDL less than 100 12/22/2013   Routine general medical examination at a health care facility 08/03/2013   Pure hyperglyceridemia 08/05/2012   Gastroparesis due to DM ([place]) 08/05/2012   HTN (hypertension), benign 04/05/2012   GERD (gastroesophageal reflux disease) 04/05/2012   Morbid obesity ([place]) 04/05/2012   Primary osteoarthritis of left knee 06/19/2011   Conditions to be addressed/monitored:  HTN and DMII  Care Plan : RN Care Manager Plan of Care  Updates made by Knox Royalty, RN since 09/27/2021 12:00 AM     Problem: Chronic Disease Management Needs   Priority: High     Long-Range Goal: Development of Plan of Care for long term chronic disease management   Start Date: 09/27/2021  Expected End Date: 09/27/2022  Priority: High  Note:   Current Barriers:  Chronic Disease Management support and education needs related to HTN and DMII Issues with phone: unable to retrieve voice messages; reports phone is unreliable; can not afford new phone Issues with swallowing- GI referral pending Financial issues in affording medications: can not afford prescribed Ozempic nor Dexilant: CCM Pharmacy referral placed today, 09/27/21 Food insecurity: currently uses established food bank; could benefit from additional resources: [employer] referral placed 09/27/21 Dental issues: insurance does not cover dental services: [facility] referral placed 09/27/21  RNCM Clinical Goal(s):  Patient will demonstrate ongoing health management independence as evidenced by adherence to plan of care for DMII; HTN        through  collaboration with RN Care manager, provider, and care team.   Interventions: 1:1 collaboration with primary care provider regarding development and update of comprehensive plan of care as evidenced by provider attestation and co-signature Inter-disciplinary care team collaboration (see longitudinal plan of care) Evaluation of current treatment plan related to  self management and patient's adherence to plan as established by provider Initial assessment initiated 09/27/21  Diabetes:  (Status: New goal.) Long Term Goal   Lab Results  Component Value Date   HGBA1C 7.7 (H) 05/23/2021  Assessed patient's understanding of A1c goal: <7% Provided education to patient about basic DM disease process; Reviewed medications with patient and discussed importance of medication adherence;        Reviewed prescribed diet with patient carb modified, low sugar, low salt, low cholesterol; Counseled on importance of regular laboratory monitoring as prescribed;        Discussed plans with patient for ongoing care management follow up and provided patient with direct contact information for care management team;      Reviewed scheduled/upcoming provider appointments including: 11/14/21- dermatology; 11/28/21- PCP;         Referral made to pharmacy team for assistance with PAP for Ozempic and Dexilant;       Referral made to community resources care guide team for assistance with dental resources/ food insecurity;      Review of patient status, including review of  consultants reports, relevant laboratory and other test results, and medications completed;       Assessed social determinant of health barriers;        Confirmed patient independently self-manages medications Assessed patient's understanding of meaning/ significance of A1-C values: no baseline understanding of same- education provided today around what the A1-C measures Reviewed individual historical A1-C trends and provided education around correlation  of A1-C value to blood sugar levels at home over 3 months Reviewed recent fasting blood sugars at home: she reports consistent values between 106-230; reports fasting value this morning of "170" Confirms she is taking metformin as prescribed: unable to take prescribed dose of Ozempic: states she is in the donut hole and samples provided by PCP at previous office visit "were only 0.5 mg;" states she has been taking samples q week, soon to run out; CCM Pharmacy referral placed Discussed issues with swallowing: states solid food gets hung in lower esophagus; reports PCP placed Gi referral "months ago;" states she has not heard from anyone yet; this continues to bother her, and causes her to vomit "foamy liquid" when she tries to pass food through esophagus; upon review of EHR- it appears GI referral was placed 05/23/21 and that GI provider has made attempts to contact patient on 08/15/21 and 09/16/21- notes indicate voice messages left for patient; patient reports she is unable to access her voice mail box; I will ask referral coordinator which practice GI referral was placed with; encouraged patient to call phone service provider to learn how to check her voice mail  Hypertension: (Status: New goal.)  LONG term Goal Last practice recorded BP readings:  BP Readings from Last 3 Encounters:  08/27/21 132/82  06/24/21 112/77  05/23/21 (!) 156/98  Most recent eGFR/CrCl: No results found for: EGFR  No components found for: CRCL  Evaluation of current treatment plan related to hypertension self management and patient's adherence to plan as established by provider;   Reviewed medications with patient and discussed importance of compliance;  Discussed complications of poorly controlled blood pressure such as heart disease, stroke, circulatory complications, vision complications, kidney impairment, sexual dysfunction;  Confirmed patient occasionally monitors blood pressures at home; does not record: encouraged to  check BP weekly and write down on paper for our future review together  Patient Goals/Self-Care Activities: As evidenced by review of EHR, collaboration with care team, and patient reporting during CCM RN CM outreach, Patient [place]  will: Take medications as prescribed Attend all scheduled provider appointments Call pharmacy for medication refills Call provider office for new concerns or questions Continue to check fasting (first thing in the morning, before eating) blood sugars at home, as well as blood sugars 2 hours after eating a regular size meal-- please write down your blood sugars on paper so we can review these during our future visits Follow heart healthy, low salt, low cholesterol, carbohydrate-modified, low sugar diet Please try to learn how to check your voice mail messages on your phone- it appears that the GI doctor has been trying to contact you to schedule an appointment Please listen out for a call from the [place] team at Dr. Ronnald Ramp' office to discuss patient assistance with your medication costs and a call from the [place] team to provide community resources that may be available to you for dental care and food acquisition  Review enclosed educational material       Plan: Telephone follow up appointment with care management team member scheduled for:  Thursday, October 17, 2021 at 10:00 am The patient has been provided with contact information for the care management team and has been advised to call with any health related questions or concerns  Oneta Rack, RN, BSN, [place] [phone]: direct office

## 2021-10-01 NOTE — Progress Notes (Signed)
Attempted to call patient to discuss patient assistance application for [id number] for her ozempic / discuss alternative PPI that would be a lower cost based on her insurance   Patient to call back to discuss

## 2021-10-02 ENCOUNTER — Encounter: Payer: Self-pay | Admitting: Internal Medicine

## 2021-10-02 ENCOUNTER — Encounter: Payer: Self-pay | Admitting: Dermatology

## 2021-10-02 NOTE — Progress Notes (Signed)
° °  Follow-Up Visit   Subjective  Abigail Collins is a 70 y.o. female who presents for the following: Skin Problem (Here to have a few lesions checked on left arm x 2 years. Not healing. Also check chest and back of neck. Also patient has a dark lesions on face. No history of skin cancers. ).  General skin examination, several spots of concern patient Location:  Duration:  Quality:  Associated Signs/Symptoms: Modifying Factors:  Severity:  Timing: Context:   Objective  Well appearing patient in no apparent distress; mood and affect are within normal limits. Multiple 3 to 10 mm textured flattopped papules  Right Forearm - Posterior 1.2 cm waxy pink crust, superficial carcinoma       Left Malar Cheek Flattened gritty 2 mm pink crust    All sun exposed areas plus back examined.   Assessment & Plan    Neoplasm of uncertain behavior of skin Right Forearm - Posterior  Skin / nail biopsy Type of biopsy: tangential   Informed consent: discussed and consent obtained   Timeout: patient name, date of birth, surgical site, and procedure verified   Anesthesia: the lesion was anesthetized in a standard fashion   Anesthetic:  1% lidocaine w/ epinephrine 1-100,000 local infiltration Instrument used: flexible razor blade   Hemostasis achieved with: ferric subsulfate   Outcome: patient tolerated procedure well   Post-procedure details: wound care instructions given    Specimen 1 - Surgical pathology Differential Diagnosis: scc vs bcc  Check Margins: No  Seborrheic keratosis  Benign no treatment needed if clinically stable  AK (actinic keratosis) Left Malar Cheek  NO treatment needed at this time.       I, Lavonna Monarch, MD, have reviewed all documentation for this visit.  The documentation on 10/02/21 for the exam, diagnosis, procedures, and orders are all accurate and complete.

## 2021-10-02 NOTE — Addendum Note (Signed)
Addended by: Lavonna Monarch on: 10/02/2021 04:59 AM   Modules accepted: Level of Service

## 2021-10-08 ENCOUNTER — Other Ambulatory Visit: Payer: Self-pay | Admitting: Internal Medicine

## 2021-10-08 DIAGNOSIS — I10 Essential (primary) hypertension: Secondary | ICD-10-CM

## 2021-10-12 DIAGNOSIS — I10 Essential (primary) hypertension: Secondary | ICD-10-CM | POA: Diagnosis not present

## 2021-10-12 DIAGNOSIS — E118 Type 2 diabetes mellitus with unspecified complications: Secondary | ICD-10-CM | POA: Diagnosis not present

## 2021-10-15 ENCOUNTER — Other Ambulatory Visit: Payer: Self-pay | Admitting: Internal Medicine

## 2021-10-16 ENCOUNTER — Other Ambulatory Visit: Payer: Self-pay | Admitting: Internal Medicine

## 2021-10-16 ENCOUNTER — Telehealth: Payer: Self-pay | Admitting: Internal Medicine

## 2021-10-16 DIAGNOSIS — E118 Type 2 diabetes mellitus with unspecified complications: Secondary | ICD-10-CM

## 2021-10-16 DIAGNOSIS — I10 Essential (primary) hypertension: Secondary | ICD-10-CM

## 2021-10-16 MED ORDER — INDAPAMIDE 1.25 MG PO TABS: 1.2500 mg | ORAL_TABLET | Freq: Every day | ORAL | 0 refills | Status: DC

## 2021-10-16 MED ORDER — ONETOUCH VERIO IQ SYSTEM W/DEVICE KIT: 1.0000 | PACK | Freq: Three times a day (TID) | 1 refills | Status: DC

## 2021-10-16 MED ORDER — ONETOUCH ULTRASOFT LANCETS MISC: 1.0000 | Freq: Three times a day (TID) | 1 refills | Status: DC

## 2021-10-16 MED ORDER — ONETOUCH VERIO VI STRP: ORAL_STRIP | 2 refills | Status: DC

## 2021-10-16 NOTE — Telephone Encounter (Signed)
1.Medication Requested: indapamide (LOZOL) 1.25 MG tablet One Touch Lancets  (not on med list)  2. Pharmacy (Name, [place], [place]): [facility], [place]  3. On Med List: yes  4. Last Visit with PCP: 08-27-2021  5. Next visit date with PCP: 11-28-2021  [facility] requesting above refills  Informed Rep one touch lancets was not on med list but  glucose blood (ONETOUCH VERIO) test strip is  Rep stated patient is requesting one touch lancets

## 2021-10-17 ENCOUNTER — Telehealth: Payer: HMO

## 2021-10-17 ENCOUNTER — Encounter: Payer: Self-pay | Admitting: *Deleted

## 2021-10-17 ENCOUNTER — Telehealth: Payer: Self-pay | Admitting: *Deleted

## 2021-10-17 NOTE — Telephone Encounter (Signed)
°  Chronic Care Management   Follow Up Note   10/17/2021 Name: Abigail Collins MRN: [id number] DOB: [birth year]  Referred by: Janith Lima, MD Reason for referral : Chronic Care Management (CCM RN CM Follow Up Outreach- Unsuccessful Attempt)  An unsuccessful telephone outreach was attempted today. The patient was referred to the case management team for assistance with care management and care coordination.   Follow Up Plan:  A HIPPA compliant phone message was left for the patient providing contact information and requesting a return call Will place request with scheduling care guide to contact patient to re-schedule today's missed CCM RN follow up telephone appointment if I do not hear back from patient by end of day  Oneta Rack, RN, BSN, [place] [phone]: direct office

## 2021-10-21 NOTE — Telephone Encounter (Signed)
Abigail Collins patient is rescheduled for 11/06/21

## 2021-10-24 ENCOUNTER — Telehealth: Payer: Self-pay

## 2021-10-24 NOTE — Telephone Encounter (Signed)
° °  Telephone encounter was:  Unsuccessful.  10/24/2021 Name: Abigail Collins MRN: [id number] DOB: [birth year]  Unsuccessful outbound call made today to assist with:   financial strain, food insecurity and dental.  Outreach Attempt:  1st Attempt  A HIPAA compliant voice message was left requesting a return call.  Instructed patient to call back at [phone].  Arantxa Piercey, AAS Paralegal, [facility] Management  [address], [place] [address] ??[email or web address]   ?? [phone]   [email or web address]

## 2021-10-25 ENCOUNTER — Telehealth: Payer: Self-pay

## 2021-10-25 ENCOUNTER — Other Ambulatory Visit: Payer: Self-pay | Admitting: Internal Medicine

## 2021-10-25 DIAGNOSIS — E118 Type 2 diabetes mellitus with unspecified complications: Secondary | ICD-10-CM

## 2021-10-25 MED ORDER — SEMAGLUTIDE (2 MG/DOSE) 8 MG/3ML ~~LOC~~ SOPN: 2.0000 mg | PEN_INJECTOR | SUBCUTANEOUS | 1 refills | Status: DC

## 2021-10-25 MED ORDER — INSULIN PEN NEEDLE 32G X 6 MM MISC: 1.0000 | 1 refills | Status: DC

## 2021-10-25 NOTE — Telephone Encounter (Signed)
Pt calling in request a refill on:  Semaglutide, 2 MG/DOSE, 8 MG/3ML [place] 08/27/21

## 2021-11-06 ENCOUNTER — Encounter: Payer: Self-pay | Admitting: *Deleted

## 2021-11-06 ENCOUNTER — Telehealth: Payer: Self-pay | Admitting: *Deleted

## 2021-11-06 ENCOUNTER — Telehealth: Payer: HMO

## 2021-11-06 NOTE — Telephone Encounter (Signed)
°  Chronic Care Management   Follow Up Note   11/06/2021 Name: Abigail Collins MRN: [id number] DOB: May 13, 1951  Referred by: Janith Lima, MD Reason for referral : Chronic Care Management (CCM RN CM Follow Up Outreach; DMII/ HTN, unsuccessful outreach attempt # 2)  A second unsuccessful telephone outreach was attempted today. The patient was referred to the case management team for assistance with care management and care coordination.   Today's follow up appointment was scheduled for patient to contact me at 9:00 am, due to ongoing issues patient has with her phone; patient did not contact me by 9:35 am, so I attempted to contact her- my attempt was unsuccessful; this is the second consecutive unsuccessful outreach attempt   Follow Up Plan:  A HIPPA compliant phone message was left for the patient providing contact information and requesting a return call Will place request with scheduling care guide to contact patient to re-schedule today's missed CCM RN follow up telephone appointment if I do not hear back from patient by end of day  Oneta Rack, RN, BSN, [place] [phone]: direct office

## 2021-11-07 ENCOUNTER — Telehealth: Payer: Self-pay

## 2021-11-07 NOTE — Telephone Encounter (Signed)
° °  Telephone encounter was:  Unsuccessful.  11/07/2021 Name: Abigail Collins MRN: [id number] DOB: [birth year]  Unsuccessful outbound call made today to assist with:  Food Insecurity and dental  Outreach Attempt:  2nd Attempt  A HIPAA compliant voice message was left requesting a return call.  Instructed patient to call back at [phone].  Mishael Haran, AAS Paralegal, [place] Management  [address], [place] [address] ??[email or web address]   ?? [phone]   [email or web address]

## 2021-11-08 ENCOUNTER — Telehealth: Payer: Self-pay | Admitting: Internal Medicine

## 2021-11-08 NOTE — Telephone Encounter (Signed)
Rep states pt is requesting to change Insulin Pen Needle (NOVOFINE) 32G X 6 MM  to  Insulin Pen Needle (NOVOFINE) 32G X 4 MM   Please advise

## 2021-11-09 ENCOUNTER — Other Ambulatory Visit: Payer: Self-pay | Admitting: Internal Medicine

## 2021-11-09 DIAGNOSIS — E118 Type 2 diabetes mellitus with unspecified complications: Secondary | ICD-10-CM

## 2021-11-09 MED ORDER — NOVOFINE PLUS PEN NEEDLE 32G X 4 MM MISC: 1.0000 | 0 refills | Status: DC

## 2021-11-11 ENCOUNTER — Other Ambulatory Visit: Payer: Self-pay | Admitting: Internal Medicine

## 2021-11-11 DIAGNOSIS — E118 Type 2 diabetes mellitus with unspecified complications: Secondary | ICD-10-CM

## 2021-11-12 ENCOUNTER — Telehealth: Payer: Self-pay

## 2021-11-12 NOTE — Telephone Encounter (Signed)
° °  Telephone encounter was:  Unsuccessful.  11/12/2021 Name: Abigail Collins MRN: [id number] DOB: 1951/04/07  Unsuccessful outbound call made today to assist with:  Food Insecurity and financial strain, dental  Outreach Attempt:  3rd Attempt.  Referral closed unable to contact patient.  A HIPAA compliant voice message was left requesting a return call.  Instructed patient to call back at [phone].  Tikia Skilton, AAS Paralegal, [facility] Management  [address], [place] [address] ??[email or web address]   ?? [phone]   [email or web address]

## 2021-11-14 ENCOUNTER — Other Ambulatory Visit: Payer: Self-pay

## 2021-11-14 ENCOUNTER — Ambulatory Visit (INDEPENDENT_AMBULATORY_CARE_PROVIDER_SITE_OTHER): Payer: HMO | Admitting: Dermatology

## 2021-11-14 DIAGNOSIS — D0461 Carcinoma in situ of skin of right upper limb, including shoulder: Secondary | ICD-10-CM

## 2021-11-14 DIAGNOSIS — L710 Perioral dermatitis: Secondary | ICD-10-CM

## 2021-11-14 DIAGNOSIS — L570 Actinic keratosis: Secondary | ICD-10-CM

## 2021-11-14 MED ORDER — METRONIDAZOLE 0.75 % EX GEL: 1.0000 "application " | Freq: Every evening | CUTANEOUS | 1 refills | Status: AC

## 2021-11-14 NOTE — Patient Instructions (Addendum)
Biopsy, Surgery (Curettage) & Surgery (Excision) Aftercare Instructions  1. Okay to remove bandage in 24 hours  2. Wash area with soap and water  3. Apply Vaseline to area twice daily until healed (Not Neosporin)  4. Okay to cover with a Band-Aid to decrease the chance of infection or prevent irritation from clothing; also it's okay to uncover lesion at home.  5. Suture instructions: return to our office in 7-10 or 10-14 days for a nurse visit for suture removal. Variable healing with sutures, if pain or itching occurs call our office. It's okay to shower or bathe 24 hours after sutures are given.  6. The following risks may occur after a biopsy, curettage or excision: bleeding, scarring, discoloration, recurrence, infection (redness, yellow drainage, pain or swelling).  7. For questions, concerns and results call our office at [place] before 4pm & Friday before 3pm. Biopsy results will be available in 1 week.   Dr. Denna Haggard also discussed new weight loss treatment Wegovy and Mounjaro.

## 2021-11-18 ENCOUNTER — Telehealth: Payer: Self-pay | Admitting: *Deleted

## 2021-11-18 ENCOUNTER — Telehealth: Payer: Self-pay

## 2021-11-18 NOTE — Progress Notes (Signed)
° ° °  Chronic Care Management Pharmacy Assistant   Name: TRISTINE LANGI  MRN: [id number] DOB: 29-Jun-1951  Called [employer] to enroll patient for patient assistance program for Symbicort. Patient needs to come to the office and sign application and also bring in proof of income to be faxed along with application. Once application is faxed it will take 3 to 4 business days for application to be processed.  Called patient to make her aware that she needs to come to the office to sign application and bring income. Left message for patient to return call.  [place] Pharmacist Assistant [phone]

## 2021-11-18 NOTE — Chronic Care Management (AMB) (Signed)
°  Care Management   Note  11/18/2021 Name: HATTYE SIEGFRIED MRN: [id number] DOB: 1951/05/18  [name] is a 71 y.o. year old female who is a primary care patient of Janith Lima, MD and is actively engaged with the care management team. I reached out to [name] by phone today to assist with re-scheduling a follow up visit with the RN Case Manager  Follow up plan: Unsuccessful telephone outreach attempt made. A HIPAA compliant phone message was left for the patient providing contact information and requesting a return call.  The care management team will reach out to the patient again over the next 7 days.  If patient returns call to provider office, please advise to call [facility] at [phone].  [facility] Management  Direct Dial: [phone]

## 2021-11-21 ENCOUNTER — Ambulatory Visit: Payer: HMO | Admitting: Dermatology

## 2021-11-25 NOTE — Chronic Care Management (AMB) (Signed)
°  Care Management   Note  11/25/2021 Name: Abigail Collins MRN: [id number] DOB: 03-04-1951  Abigail Collins is a 71 y.o. year old female who is a primary care patient of Janith Lima, MD and is actively engaged with the care management team. I reached out to Abigail Collins by phone today to assist with re-scheduling a follow up visit with the RN Case Manager  Follow up plan: Unsuccessful telephone outreach attempt made. A HIPAA compliant phone message was left for the patient providing contact information and requesting a return call.  The care management team will reach out to the patient again over the next 7 days.  If patient returns call to provider office, please advise to call [place] at [phone].  [facility] Management  Direct Dial: [phone]

## 2021-11-28 ENCOUNTER — Ambulatory Visit: Payer: HMO | Admitting: Internal Medicine

## 2021-12-02 NOTE — Chronic Care Management (AMB) (Signed)
°  Care Management   Note  12/02/2021 Name: Abigail Collins MRN: [id number] DOB: [birth year]  Abigail Collins is a 71 y.o. year old female who is a primary care patient of Janith Lima, MD and is actively engaged with the care management team. I reached out to Abigail Collins by phone today to assist with re-scheduling a follow up visit with the RN Case Manager  Follow up plan: Telephone appointment with care management team member scheduled for:12/24/21  [place], [place] Management  Direct Dial: [phone]

## 2021-12-03 ENCOUNTER — Ambulatory Visit: Payer: HMO | Admitting: Internal Medicine

## 2021-12-07 ENCOUNTER — Encounter: Payer: Self-pay | Admitting: Dermatology

## 2021-12-07 NOTE — Progress Notes (Signed)
° °  Follow-Up Visit   Subjective  Abigail Collins is a 71 y.o. female who presents for the following: Procedure (Patient here today for treatment of CIS x 1 right forearm - posterior).  Skin cancer right arm, also crust and rash on face Location:  Duration:  Quality:  Associated Signs/Symptoms: Modifying Factors:  Severity:  Timing: Context:   Objective  Well appearing patient in no apparent distress; mood and affect are within normal limits. Right Forearm - Posterior Lesion identified by Dr.Sincere Berlanga and nurse in room.    Right Forearm - Posterior Hornlike 4 mm pink crust  Left Upper Cutaneous Lip, Right Melolabial Fold, Right Upper Cutaneous Lip Micro papular upper perioral and perinasal dermatitis.  Uncommon to see classic perioral dermatitis at age 45 but this is a better if clinical fit and rosacea or contact dermatitis.    A focused examination was performed including head, neck, arms, face. Relevant physical exam findings are noted in the Assessment and Plan.   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 Right Forearm - Posterior  Destruction of lesion Complexity: simple   Destruction method: electrodesiccation and curettage   Informed consent: discussed and consent obtained   Timeout:  patient name, date of birth, surgical site, and procedure verified Anesthesia: the lesion was anesthetized in a standard fashion   Anesthetic:  1% lidocaine w/ epinephrine 1-100,000 local infiltration Curettage performed in three different directions: Yes     Electrodesiccation performed over the curetted area: No   Curettage cycles:  3 Lesion length (cm):  1.4 Lesion width (cm):  1.4 Margin per side (cm):  0 Final wound size (cm):  1.4 Hemostasis achieved with:  ferric subsulfate Outcome: patient tolerated procedure well with no complications   Post-procedure details: sterile dressing applied and wound care instructions given    Dressing type: bandage and petrolatum   Additional details:  Wound innoculated with 5 fluorouracil solution.  AK (actinic keratosis) Right Forearm - Posterior  Destruction of lesion - Right Forearm - Posterior Complexity: simple   Destruction method: cryotherapy   Informed consent: discussed and consent obtained   Timeout:  patient name, date of birth, surgical site, and procedure verified Lesion destroyed using liquid nitrogen: Yes   Region frozen until ice ball extended beyond lesion: Yes   Cryotherapy cycles:  3 Outcome: patient tolerated procedure well with no complications   Post-procedure details: wound care instructions given    Perioral dermatitis Right Melolabial Fold; Left Upper Cutaneous Lip; Right Upper Cutaneous Lip  Topical metronidazole nightly for 6 weeks, followed by MyChart at that time.  metroNIDAZOLE (METROGEL) 0.75 % gel - Left Upper Cutaneous Lip, Right Melolabial Fold, Right Upper Cutaneous Lip Apply 1 application topically at bedtime.      I, Lavonna Monarch, MD, have reviewed all documentation for this visit.  The documentation on 12/07/21 for the exam, diagnosis, procedures, and orders are all accurate and complete.

## 2021-12-24 ENCOUNTER — Encounter: Payer: Self-pay | Admitting: *Deleted

## 2021-12-24 ENCOUNTER — Telehealth: Payer: Self-pay | Admitting: Internal Medicine

## 2021-12-24 ENCOUNTER — Telehealth: Payer: HMO

## 2021-12-24 ENCOUNTER — Telehealth: Payer: Self-pay | Admitting: *Deleted

## 2021-12-24 ENCOUNTER — Other Ambulatory Visit: Payer: Self-pay

## 2021-12-24 NOTE — Telephone Encounter (Signed)
Pt states she checked in for an appt on 3-14, pt states she was under the assumption she was being "worked in" to see Dr. Ronnald Ramp per her earlier request for a sick visit ? ?Pt states she was checked in by Sarah at the front desk at 3:00, pt waited until 4:00 and inquired about the wait time, pt was then told by Judson Roch she did not have an ov but a telephone visit w/ [facility] care, pt then became upset ? ?Pt states she signed for the visit and is worried her insurance will be charged ? ?Pt requesting a call back  ?

## 2021-12-24 NOTE — Telephone Encounter (Addendum)
?  Chronic Care Management  ? ?Follow Up Note ? ? ?12/24/2021 ?Name: NORINNE JEANE MRN: [id number] DOB: [birth year] ? ?Referred by: Janith Lima, MD ?Reason for referral : No chief complaint on file. ? ?Third unsuccessful telephone outreach was attempted today. The patient was referred to the case management team for assistance with care management and care coordination. The patient's primary care provider has been notified of our unsuccessful attempts to make or maintain contact with the patient. The care management team is pleased to engage with this patient at any time in the future should he/she be interested in assistance from the care management team.  ? ?Called number [phone]) as advised by scheduling care guide: Received automated outgoing message stating that "the person you are trying to call is not available; please try your call later;" outreach attempt automatically hung up without allowing me to leave voice message requesting call back ?Second outreach attempt completed using phone number listed in EHR for patient: A HIPPA compliant phone message was left for the patient providing contact information and requesting a return call ? ?Follow Up Plan: No further follow up required: third consecutive unsuccessful outreach attempt  without patient call-back: case closure accordingly-- CCM team and Community resource care guide team has placed multiple outreach attempts to patient, all unsuccessful-- will make PCP aware of case closure  ? ?Oneta Rack, RN, BSN, CCRN Alumnus ?[place] ?[phone]: direct office ? ?

## 2021-12-26 ENCOUNTER — Ambulatory Visit (INDEPENDENT_AMBULATORY_CARE_PROVIDER_SITE_OTHER): Payer: HMO | Admitting: *Deleted

## 2021-12-26 NOTE — Chronic Care Management (AMB) (Signed)
?Chronic Care Management  ? ?CCM RN Visit Note ? ?12/26/2021 ?Name: Abigail Collins MRN: [id number] DOB: [birth year] ? ?Subjective: ?Abigail Collins is a 71 y.o. year old female who is a primary care patient of Janith Lima, MD. The care management team was consulted for assistance with disease management and care coordination needs.   ? ?Collaboration with [facility]  for  case closure  in response to provider referral for case management and/or care coordination services.  ? ?Consent to Services:  ?The patient was given information about Chronic Care Management services, agreed to services, and gave verbal consent prior to initiation of services.  Please see initial visit note for detailed documentation.  ?Patient agreed to services and verbal consent obtained.  ? ?Assessment: Review of patient past medical history, allergies, medications, health status, including review of consultants reports, laboratory and other test data, was performed as part of comprehensive evaluation and provision of chronic care management services.  ?CCM Care Plan ? ?Allergies  ?Allergen Reactions  ? Lisinopril Shortness Of Breath  ? Amlodipine Swelling  ?  UNSPECIFIED EDEMA  ? Tape Other (See Comments)  ?  Tore skin--?hypofix  ? Morphine And Related Nausea And Vomiting  ? Percocet [Oxycodone-Acetaminophen] Nausea And Vomiting  ? ?Outpatient Encounter Medications as of 12/26/2021  ?Medication Sig Note  ? albuterol (PROVENTIL) (2.5 MG/3ML) 0.083% nebulizer solution Inhale 2.5 mg into the lungs every 6 (six) hours as needed for wheezing or shortness of breath. Reported on 01/17/2016   ? atorvastatin (LIPITOR) 20 MG tablet Take 1 tablet (20 mg total) by mouth daily.   ? Blood Glucose Monitoring Suppl (ONETOUCH VERIO IQ SYSTEM) w/Device KIT 1 Act by Does not apply route 3 (three) times daily.   ? dexlansoprazole (DEXILANT) 60 MG capsule Take 1 capsule (60 mg total) by mouth daily. (Patient not taking: Reported on 09/27/2021)  09/27/2021: 09/27/21- reports not taking, can not afford  ? glucose blood (ONETOUCH VERIO) test strip TEST THREE TIMES DAILY   ? indapamide (LOZOL) 1.25 MG tablet Take 1 tablet (1.25 mg total) by mouth daily.   ? irbesartan (AVAPRO) 300 MG tablet TAKE 1 TABLET(300 MG) BY MOUTH DAILY   ? Lancets (ONETOUCH ULTRASOFT) lancets 1 each by Other route 3 (three) times daily. Use as instructed   ? metFORMIN (GLUCOPHAGE) 1000 MG tablet TAKE 1 TABLET BY MOUTH TWICE DAILY WITH MEALS   ? metroNIDAZOLE (METROGEL) 0.75 % gel Apply 1 application topically at bedtime.   ? Semaglutide, 2 MG/DOSE, 8 MG/3ML SOPN Inject 2 mg as directed once a week.   ? SURE COMFORT PEN NEEDLES 32G X 4 MM MISC USE 1 PEN NEEDLE ONCE A WEEK   ? ?No facility-administered encounter medications on file as of 12/26/2021.  ? ?Patient Active Problem List  ? Diagnosis Date Noted  ? Type II diabetes mellitus with manifestations ([place]) 08/27/2021  ? Esophageal dysphagia 05/23/2021  ? Gastroesophageal reflux disease with esophagitis without hemorrhage 05/23/2021  ? Encounter for general adult medical examination with abnormal findings 05/23/2021  ? Age-related osteoporosis without current pathological fracture 04/12/2019  ? OAB (overactive bladder) 04/12/2019  ? Vitamin D deficiency disease 05/07/2018  ? Excessive daytime sleepiness 05/27/2016  ? COPD mixed type ([place]) 01/09/2016  ? Visit for screening mammogram 03/28/2015  ? Depression with somatization 03/28/2015  ? Insomnia 03/28/2015  ? Primary osteoarthritis of both knees 03/28/2015  ? Allergic rhinitis 03/15/2014  ? Hyperlipidemia with target LDL less than 100 12/22/2013  ? Routine  general medical examination at a health care facility 08/03/2013  ? Pure hyperglyceridemia 08/05/2012  ? Gastroparesis due to DM ([place]) 08/05/2012  ? HTN (hypertension), benign 04/05/2012  ? GERD (gastroesophageal reflux disease) 04/05/2012  ? Morbid obesity ([place]) 04/05/2012  ? Primary osteoarthritis of left knee 06/19/2011   ? ?Conditions to be addressed/monitored:  HTN and DMII ? ?Care Plan : RN Care Manager Plan of Care  ?Updates made by Knox Royalty, RN since 12/26/2021 12:00 AM  ?  ? ?Problem: Chronic Disease Management Needs   ?Priority: High  ?  ? ?Long-Range Goal: Development of Plan of Care for long term chronic disease management   ?Start Date: 09/27/2021  ?Expected End Date: 09/27/2022  ?Priority: High  ?Note:   ?Current Barriers:  ?Chronic Disease Management support and education needs related to HTN and DMII ?Issues with phone: unable to retrieve voice messages; reports phone is unreliable; can not afford new phone ?Issues with swallowing- GI referral pending ?Financial issues in affording medications: can not afford prescribed Ozempic nor Dexilant: [place] referral placed today, 09/27/21 ?Food insecurity: currently uses established food bank; could benefit from additional resources: [employer] referral placed 09/27/21 ?Dental issues: insurance does not cover dental services: [place] referral placed 09/27/21 ? ?RNCM Clinical Goal(s):  ?Patient will demonstrate ongoing health management independence as evidenced by adherence to plan of care for DMII; HTN        through collaboration with RN Care manager, provider, and care team.  ? ?Interventions: ?1:1 collaboration with primary care provider regarding development and update of comprehensive plan of care as evidenced by provider attestation and co-signature ?Inter-disciplinary care team collaboration (see longitudinal plan of care) ?Evaluation of current treatment plan related to  self management and patient's adherence to plan as established by provider ?Initial assessment initiated 09/27/21 ? ?12/26/21: Case closure: multiple unsuccessful outreaches attempted by CCM team since original successful outreach on 09/27/22- third consecutive unsuccessful outreach placed 12/24/21; patient has not yet returned voice message left on  12/24/21 to reschedule; case closure completed today with completion of care plan goals; PCP made aware of case closure today  ?  ? ?Plan: ?We have been unable to make contact with the patient for follow up. The care management team is available to follow up with the patient after provider conversation with the patient regarding recommendation for care management engagement and subsequent re-referral to the care management team. ?No further follow up required: case closure completed, PCP made aware of same ? ?Oneta Rack, RN, BSN, CCRN Alumnus ?[place] ?([phone]: direct office ? ? ? ? ? ? ? ? ? ?

## 2022-01-09 ENCOUNTER — Encounter: Payer: Self-pay | Admitting: Nurse Practitioner

## 2022-01-10 DIAGNOSIS — E118 Type 2 diabetes mellitus with unspecified complications: Secondary | ICD-10-CM

## 2022-01-10 DIAGNOSIS — I10 Essential (primary) hypertension: Secondary | ICD-10-CM

## 2022-01-14 ENCOUNTER — Ambulatory Visit (INDEPENDENT_AMBULATORY_CARE_PROVIDER_SITE_OTHER): Payer: HMO

## 2022-01-14 ENCOUNTER — Encounter: Payer: Self-pay | Admitting: Internal Medicine

## 2022-01-14 ENCOUNTER — Ambulatory Visit (INDEPENDENT_AMBULATORY_CARE_PROVIDER_SITE_OTHER): Payer: HMO | Admitting: Internal Medicine

## 2022-01-14 ENCOUNTER — Other Ambulatory Visit: Payer: Self-pay | Admitting: Internal Medicine

## 2022-01-14 VITALS — BP 126/76 | HR 96 | Temp 98.2°F | Ht 63.5 in | Wt 210.0 lb

## 2022-01-14 DIAGNOSIS — M5134 Other intervertebral disc degeneration, thoracic region: Secondary | ICD-10-CM | POA: Diagnosis not present

## 2022-01-14 DIAGNOSIS — M545 Low back pain, unspecified: Secondary | ICD-10-CM

## 2022-01-14 DIAGNOSIS — E118 Type 2 diabetes mellitus with unspecified complications: Secondary | ICD-10-CM

## 2022-01-14 DIAGNOSIS — G8929 Other chronic pain: Secondary | ICD-10-CM

## 2022-01-14 DIAGNOSIS — E781 Pure hyperglyceridemia: Secondary | ICD-10-CM | POA: Diagnosis not present

## 2022-01-14 DIAGNOSIS — M4184 Other forms of scoliosis, thoracic region: Secondary | ICD-10-CM | POA: Diagnosis not present

## 2022-01-14 DIAGNOSIS — I10 Essential (primary) hypertension: Secondary | ICD-10-CM

## 2022-01-14 DIAGNOSIS — T50905A Adverse effect of unspecified drugs, medicaments and biological substances, initial encounter: Secondary | ICD-10-CM | POA: Diagnosis not present

## 2022-01-14 DIAGNOSIS — Z23 Encounter for immunization: Secondary | ICD-10-CM

## 2022-01-14 LAB — BASIC METABOLIC PANEL
BUN: 21 mg/dL (ref 6–23)
CO2: 31 mEq/L (ref 19–32)
Calcium: 10.8 mg/dL — ABNORMAL HIGH (ref 8.4–10.5)
Chloride: 100 mEq/L (ref 96–112)
Creatinine, Ser: 0.97 mg/dL (ref 0.40–1.20)
GFR: 59.47 mL/min — ABNORMAL LOW (ref >60.00)
Glucose, Bld: 140 mg/dL — ABNORMAL HIGH (ref 70–99)
Potassium: 3.9 mEq/L (ref 3.5–5.1)
Sodium: 139 mEq/L (ref 135–145)

## 2022-01-14 LAB — LIPID PANEL
Cholesterol: 114 mg/dL (ref 0–200)
HDL: 59.9 mg/dL (ref >39.00)
LDL Cholesterol: 15 mg/dL (ref 0–99)
NonHDL: 53.79
Total CHOL/HDL Ratio: 2
Triglycerides: 192 mg/dL — ABNORMAL HIGH (ref 0.0–149.0)
VLDL: 38.4 mg/dL (ref 0.0–40.0)

## 2022-01-14 LAB — HEPATIC FUNCTION PANEL
ALT: 16 U/L (ref 0–35)
AST: 21 U/L (ref 0–37)
Albumin: 4.6 g/dL (ref 3.5–5.2)
Alkaline Phosphatase: 67 U/L (ref 39–117)
Bilirubin, Direct: 0.2 mg/dL (ref 0.0–0.3)
Total Bilirubin: 0.9 mg/dL (ref 0.2–1.2)
Total Protein: 7.6 g/dL (ref 6.0–8.3)

## 2022-01-14 LAB — HEMOGLOBIN A1C: Hgb A1c MFr Bld: 7.8 % — ABNORMAL HIGH (ref 4.6–6.5)

## 2022-01-14 LAB — TSH: TSH: 2.39 u[IU]/mL (ref 0.35–5.50)

## 2022-01-14 NOTE — Progress Notes (Signed)
? ?Subjective:  ?Patient ID: Abigail Collins, female    DOB: [birth year]  Age: 71 y.o. MRN: [id number] ? ?CC: Diabetes, Hypertension, and Back Pain ? ? ?HPI ?Abigail Collins presents for f/up - ? ?She complains of chronic and somewhat worsening low back pain.  There is no radiation into her lower extremities and she denies any recent trauma, injury, or paresthesias.  She is active and denies chest pain, shortness of breath, dizziness, lightheadedness, edema, or fatigue. ? ?Outpatient Medications Prior to Visit  ?Medication Sig Dispense Refill  ? albuterol (PROVENTIL) (2.5 MG/3ML) 0.083% nebulizer solution Inhale 2.5 mg into the lungs every 6 (six) hours as needed for wheezing or shortness of breath. Reported on 01/17/2016    ? atorvastatin (LIPITOR) 20 MG tablet Take 1 tablet (20 mg total) by mouth daily. 120 tablet 1  ? Blood Glucose Monitoring Suppl (ONETOUCH VERIO IQ SYSTEM) w/Device KIT 1 Act by Does not apply route 3 (three) times daily. 2 kit 1  ? dexlansoprazole (DEXILANT) 60 MG capsule Take 1 capsule (60 mg total) by mouth daily. 120 capsule 1  ? irbesartan (AVAPRO) 300 MG tablet TAKE 1 TABLET(300 MG) BY MOUTH DAILY 90 tablet 1  ? Lancets (ONETOUCH ULTRASOFT) lancets 1 each by Other route 3 (three) times daily. Use as instructed 300 each 1  ? metroNIDAZOLE (METROGEL) 0.75 % gel Apply 1 application topically at bedtime. 45 g 1  ? Semaglutide, 2 MG/DOSE, 8 MG/3ML SOPN Inject 2 mg as directed once a week. 9 mL 1  ? SURE COMFORT PEN NEEDLES 32G X 4 MM MISC USE 1 PEN NEEDLE ONCE A WEEK 100 each 10  ? glucose blood (ONETOUCH VERIO) test strip TEST THREE TIMES DAILY 200 strip 2  ? indapamide (LOZOL) 1.25 MG tablet Take 1 tablet (1.25 mg total) by mouth daily. 90 tablet 0  ? metFORMIN (GLUCOPHAGE) 1000 MG tablet TAKE 1 TABLET BY MOUTH TWICE DAILY WITH MEALS 180 tablet 0  ? ?No facility-administered medications prior to visit.  ? ? ?ROS ?Review of Systems  ?Constitutional:  Positive for fatigue and unexpected weight  change. Negative for chills, diaphoresis and fever.  ?HENT:  Positive for trouble swallowing.   ?Eyes: Negative.   ?Respiratory:  Negative for cough, chest tightness, shortness of breath and wheezing.   ?Cardiovascular:  Negative for chest pain, palpitations and leg swelling.  ?Gastrointestinal:  Negative for abdominal pain, constipation, diarrhea, nausea and vomiting.  ?Genitourinary:  Negative for difficulty urinating, dysuria and urgency.  ?Musculoskeletal:  Positive for back pain. Negative for arthralgias and myalgias.  ?Skin: Negative.   ?Neurological:  Negative for dizziness, weakness, light-headedness and headaches.  ?Hematological:  Negative for adenopathy. Does not bruise/bleed easily.  ?Psychiatric/Behavioral:  Positive for sleep disturbance. Negative for decreased concentration and dysphoric mood.   ? ?Objective:  ?BP 126/76 (BP Location: Left Arm, Patient Position: Sitting, Cuff Size: Large)   Pulse 96   Temp 98.2 ?F (36.8 ?C) (Oral)   Ht 5' 3.5" (1.613 m)   Wt 210 lb (95.3 kg)   SpO2 96%   BMI 36.62 kg/m?  ? ?BP Readings from Last 3 Encounters:  ?01/14/22 126/76  ?08/27/21 132/82  ?06/24/21 112/77  ? ? ?Wt Readings from Last 3 Encounters:  ?01/14/22 210 lb (95.3 kg)  ?08/27/21 214 lb (97.1 kg)  ?05/23/21 218 lb (98.9 kg)  ? ? ?Physical Exam ?Vitals reviewed.  ?Constitutional:   ?   Appearance: She is not ill-appearing.  ?HENT:  ?   Nose: Nose  normal.  ?   Mouth/Throat:  ?   Mouth: Mucous membranes are moist.  ?Eyes:  ?   General: No scleral icterus. ?   Conjunctiva/sclera: Conjunctivae normal.  ?Cardiovascular:  ?   Rate and Rhythm: Normal rate and regular rhythm.  ?   Heart sounds: No murmur heard. ?Pulmonary:  ?   Effort: Pulmonary effort is normal.  ?   Breath sounds: No stridor. No wheezing, rhonchi or rales.  ?Abdominal:  ?   General: Abdomen is protuberant.  ?   Palpations: There is no mass.  ?   Tenderness: There is no abdominal tenderness. There is no guarding.  ?   Hernia: No hernia is  present.  ?Musculoskeletal:     ?   General: Normal range of motion.  ?   Cervical back: Neck supple.  ?   Right lower leg: No edema.  ?   Left lower leg: No edema.  ?Lymphadenopathy:  ?   Cervical: No cervical adenopathy.  ?Skin: ?   General: Skin is warm and dry.  ?Neurological:  ?   General: No focal deficit present.  ?   Mental Status: She is alert. Mental status is at baseline.  ? ? ?Lab Results  ?Component Value Date  ? WBC 6.8 05/23/2021  ? HGB 13.2 05/23/2021  ? HCT 40.5 05/23/2021  ? PLT 175.0 05/23/2021  ? GLUCOSE 140 (H) 01/14/2022  ? CHOL 114 01/14/2022  ? TRIG 192.0 (H) 01/14/2022  ? HDL 59.90 01/14/2022  ? LDLDIRECT 82.0 01/24/2021  ? [place] 15 01/14/2022  ? ALT 16 01/14/2022  ? AST 21 01/14/2022  ? NA 139 01/14/2022  ? K 3.9 01/14/2022  ? CL 100 01/14/2022  ? CREATININE 0.97 01/14/2022  ? BUN 21 01/14/2022  ? CO2 31 01/14/2022  ? TSH 2.39 01/14/2022  ? INR 0.91 07/26/2018  ? HGBA1C 7.8 (H) 01/14/2022  ? MICROALBUR 0.9 05/23/2021  ? ? ?VAS [place] LOWER EXTREMITY VENOUS (DVT) ? ?Result Date: 11/30/2018 ? Lower Venous Study Indications: Pain.  Performing Technologist: Abram Sander RVS  Examination Guidelines: A complete evaluation includes B-mode imaging, spectral Doppler, color Doppler, and power Doppler as needed of all accessible portions of each vessel. Bilateral testing is considered an integral part of a complete examination. Limited examinations for reoccurring indications may be performed as noted.  Right Venous Findings: +---+---------------+---------+-----------+----------+-------+    CompressibilityPhasicitySpontaneityPropertiesSummary +---+---------------+---------+-----------+----------+-------+ CFVFull           Yes      Yes                          +---+---------------+---------+-----------+----------+-------+  Left Venous Findings: +---------+---------------+---------+-----------+----------+--------------+          CompressibilityPhasicitySpontaneityPropertiesSummary         +---------+---------------+---------+-----------+----------+--------------+ CFV      Full           Yes      Yes                                 +---------+---------------+---------+-----------+----------+--------------+ SFJ      Full                                                        +---------+---------------+---------+-----------+----------+--------------+ FV Prox  Full                                                        +---------+---------------+---------+-----------+----------+--------------+ FV Mid   Full                                                        +---------+---------------+---------+-----------+----------+--------------+ FV DistalFull                                                        +---------+---------------+---------+-----------+----------+--------------+ PFV      Full                                                        +---------+---------------+---------+-----------+----------+--------------+ POP      Full           Yes      Yes                                 +---------+---------------+---------+-----------+----------+--------------+ PTV      Full                                                        +---------+---------------+---------+-----------+----------+--------------+ PERO                                                  Not visualized +---------+---------------+---------+-----------+----------+--------------+    Summary: Right: No evidence of common femoral vein obstruction. Left: There is no evidence of deep vein thrombosis in the lower extremity. No cystic structure found in the popliteal fossa.  *See table(s) above for measurements and observations. Electronically signed by Deitra Mayo MD on 11/30/2018 at 2:49:43 PM.    Final   ? ?DG Lumbar Spine Complete ? ?Result Date: 01/15/2022 ?CLINICAL DATA:  Worsening low back pain for 6 months. History of lumbar surgery. EXAM: LUMBAR SPINE - COMPLETE 4+  VIEW COMPARISON:  X-ray lumbar 03/08/2013; MR lumbar 10/30/2012. FINDINGS: Minimal scoliosis convex to the left with the apex at L3. Distant vertebral augmentation at L2 for treatment of a minor compression fracture. No progression over time. Chronic central depression of the superior endpl

## 2022-01-14 NOTE — Patient Instructions (Signed)
Spondylolysis Spondylolysis is a small break or crack (stress fracture) in a bone in the spine (vertebra) in the lower back (lumbar spine). The stress fracture occurs on the bony mass between and behind the vertebra. Spondylolysis may be caused by an injury (trauma) or by overuse. Since the lower back is almost always under pressure from daily living, this stress fracture usually does not heal normally. Spondylolysis may eventually cause one vertebra to slip forward and out of place (spondylolisthesis). What are the causes? This condition may be caused by: Trauma, such as a fall. Excessive wear and tear. This is often a result of doing sports or physical activities that involve repetitive overstretching (hyperextension) and rotation of the spine. What increases the risk? You are more likely to develop this condition if you have: A family history of this condition. An inward curvature of your spine (lordosis). A condition that affects your spine, such as spina bifida. You are more likely to develop this condition if you participate in: Gymnastics. Dance. Football. Wrestling. Martial arts. Weight lifting. Tennis. Swimming. What are the signs or symptoms? Symptoms of this condition may include: Long-lasting (chronic) pain in the lower back. Stiffness in the back or legs. Tightness in the hamstring muscles, which are in the backs of the thighs. In some cases, there may be no symptoms of this condition. How is this diagnosed? This condition may be diagnosed based on: Your symptoms. Your medical history. A physical exam. Imaging tests, such as: X-rays. CT scan. MRI. How is this treated? This condition may be treated by: Resting. You may be asked to avoid or modify activities that put strain on your back until your symptoms improve. Medicines to help relieve pain. NSAIDs to help reduce swelling and discomfort. Injections of medicine (cortisone) in your back. These injections can  help to relieve pain and numbness. A brace to stabilize and support your back. Physical therapy. You may work with an occupational therapist or physical therapist who can teach you how to reduce pressure on your back while you do everyday activities. Surgery. This may be needed if you have: A severe injury. Pain that lasts for more than 6 months. Numbness in your pelvic region. Changes in control of your stool or urine. Follow these instructions at home: Medicines Take over-the-counter and prescription medicines only as told by your health care provider. Ask your health care provider if the medicine prescribed to you: Requires you to avoid driving or using heavy machinery. Can cause constipation. You may need to take these actions to prevent or treat constipation: Drink enough fluid to keep your urine pale yellow. Take over-the-counter or prescription medicines. Eat foods that are high in fiber, such as beans, whole grains, and fresh fruits and vegetables. Limit foods that are high in fat and processed sugars, such as fried or sweet foods. If you have a brace: Wear the brace as told by your health care provider. Remove it only as told by your health care provider. Keep the brace clean. If the brace is not waterproof: Do not let it get wet. Cover it with a watertight covering when you take a bath or a shower. Activity Rest and return to your normal activities as told by your health care provider. Ask your health care provider what activities are safe for you. Ask your health care provider when it is safe to drive if you have a back brace. Work with a physical therapist to make a safe exercise program, as recommended by your health care   provider. Do exercises as told by your physical therapist. This may include exercises to strengthen your back and abdominal muscles (core exercises). Managing pain, stiffness, and swelling   If directed, put ice on the affected area. If you have a  removable brace, remove it as told by your health care provider. Put ice in a plastic bag. Place a towel between your skin and the bag. Leave the ice on for 20 minutes, 2-3 times a day. If directed, apply heat to the affected area as often as told by your health care provider. Use the heat source that your health care provider recommends, such as a moist heat pack or a heating pad. If you have a removable brace, remove it as told by your health care provider. Place a towel between your skin and the heat source. Leave the heat on for 20-30 minutes. Remove the heat if your skin turns bright red. This is especially important if you are unable to feel pain, heat, or cold. You may have a greater risk of getting burned. General instructions Do not use any products that contain nicotine or tobacco, such as cigarettes, e-cigarettes, and chewing tobacco. These can delay bone healing. If you need help quitting, ask your health care provider. Maintain a healthy weight. Extra weight puts stress on your back. Keep all follow-up visits as told by your health care provider. This is important. Contact a health care provider if: You have pain that gets worse or does not get better. Get help right away if: You have severe back pain. You have changes in control of your stool or urine. You develop weakness or numbness in your legs. You are unable to stand or walk. Summary Spondylolysis is a small break or crack (stress fracture) in a bone in the spine (vertebra) in the lower back (lumbar spine). This condition may be treated by resting, medicines, physical therapy, wearing a brace, or surgery. Rest and return to your normal activities as told by your health care provider. Ask your health care provider what activities are safe for you. Contact a health care provider if you have pain that gets worse or does not get better. This information is not intended to replace advice given to you by your health care  provider. Make sure you discuss any questions you have with your health care provider. Document Revised: 01/20/2019 Document Reviewed: 05/04/2018 Elsevier Patient Education  2022 [employer]  

## 2022-01-15 ENCOUNTER — Other Ambulatory Visit: Payer: Self-pay | Admitting: Internal Medicine

## 2022-01-15 DIAGNOSIS — T50905A Adverse effect of unspecified drugs, medicaments and biological substances, initial encounter: Secondary | ICD-10-CM | POA: Insufficient documentation

## 2022-01-15 MED ORDER — METFORMIN HCL 1000 MG PO TABS: 1000.0000 mg | ORAL_TABLET | Freq: Two times a day (BID) | ORAL | 0 refills | Status: DC

## 2022-01-18 DIAGNOSIS — Z23 Encounter for immunization: Secondary | ICD-10-CM | POA: Insufficient documentation

## 2022-01-18 MED ORDER — SHINGRIX 50 MCG/0.5ML IM SUSR: 0.5000 mL | Freq: Once | INTRAMUSCULAR | 1 refills | Status: AC

## 2022-01-22 ENCOUNTER — Other Ambulatory Visit: Payer: Self-pay | Admitting: Internal Medicine

## 2022-01-22 DIAGNOSIS — E785 Hyperlipidemia, unspecified: Secondary | ICD-10-CM

## 2022-01-28 ENCOUNTER — Encounter: Payer: Self-pay | Admitting: Nurse Practitioner

## 2022-01-28 ENCOUNTER — Other Ambulatory Visit: Payer: Self-pay

## 2022-01-28 ENCOUNTER — Ambulatory Visit: Payer: HMO | Admitting: Nurse Practitioner

## 2022-01-28 VITALS — BP 124/76 | HR 100 | Ht 63.5 in | Wt 211.0 lb

## 2022-01-28 DIAGNOSIS — K219 Gastro-esophageal reflux disease without esophagitis: Secondary | ICD-10-CM | POA: Diagnosis not present

## 2022-01-28 DIAGNOSIS — R1319 Other dysphagia: Secondary | ICD-10-CM | POA: Diagnosis not present

## 2022-01-28 MED ORDER — OMEPRAZOLE 40 MG PO CPDR: 40.0000 mg | DELAYED_RELEASE_CAPSULE | Freq: Every day | ORAL | 3 refills | Status: DC

## 2022-01-28 NOTE — Progress Notes (Signed)
? ? ?Assessment  ? ?Patient profile:  ?Abigail Collins is a 71 y.o. female known to Dr. Carlean Purl  referred by PCP for dysphagia, DM2, . Past medical history significant for hyperlipidemia, hypertension, arthritis,  type 2 diabetes, remote cholecystectomy. See PMH below for any additional history ? ?Recent worsening of chronic acid regurgitation, throat burning, early satiety, postprandial nausea. Off Dexilant for two years. Due to cost she hasn't been treated for GERD in two years except for Tums as needed. , Seems she has untreated GERD but also concerned that [place] could be causing some delay in gastric emptying leading to early satiety / nausea and also worsening GERD.  ? ?Solid food dysphagia. Could be related to untreated GERD. Possibly esophageal dysmotility. Possible esophageal stricture / ring.  ? ?Negative screening colonoscopy 2016.  A 10-year screening colonoscopy is due 2026 ? ?Plan  ? ?Discussed anti-reflux measures such as avoidance of late meals / bedtime snacks, HOB elevation (or use of wedge pillow), weight reduction ( if applicable)  / maintaining a healthy BMI ( body mass index),  and avoidance of trigger foods and caffeine.  ?She hasn't been treated for reflux in years, couldn't afford treatment. We checked and her insurance should cover Omeprazole. Will start 40 mg 30 min before lunch as she doesn't eat breakfast ?Barium swallow with tablet. If abnormal may need EGD. However she will not be able to have one done until June as husband is having surgery in May ?Eat slowly, chew food well before swallowing. Drink  liquids in between each bite to avoid food impaction. ?Follow up with me in June. She cannot come in May but will call with barium swallow results.  ?She will let us know if Omeprazole not affordable. Then try OTC prilosec or Pepcid.  ?If fails to improve and GI evaluation negative then she may need to hold Ozempic for a while and see if things improve. If appears she hasn't lost much  weight on Ozempic . Weight has fluctuated between 207 and 218 pounds for the last two years. Recent A1c is 7.8.  ? ?History of Present Illness  ? ?Chief complaint:  swallowing problems ? ?Having solid food dysphagia. When food gets hung she starts vomiting foam/ phlegm. Water doesn't help pass the food. In about 10 minutes she can feel the food pass. She is regurgitating stomach acid which burns her throat and this occurs a lot at night. Sleeps on 3 pillows.  Also she is having postprandial nausea. She gets full easy and is frequently nauseated after eating. She has  ?She used to take Dexilant but cannot afford it . She hasn't used anything other than Tums as needed for GERD in two years. .  ? ?She has been on Ozempic for two years, has lost 50 pounds  ? ?Previous Labs / Imaging:: ? ?  Latest Ref Rng & Units 05/23/2021  ?  3:59 PM 01/24/2021  ? 12:11 PM 04/12/2019  ?  1:54 PM  ?CBC  ?WBC 4.0 - 10.5 K/uL 6.8   6.8   8.8    ?Hemoglobin 12.0 - 15.0 g/dL 13.2   13.5   14.3    ?Hematocrit 36.0 - 46.0 % 40.5   41.0   42.5    ?Platelets 150.0 - 400.0 K/uL 175.0   220.0   205.0    ? ? ?No results found for: LIPASE ? ?  Latest Ref Rng & Units 01/14/2022  ?  1:50 PM 05/23/2021  ?  3:59 PM  01/24/2021  ? 12:11 PM  ?CMP  ?Glucose 70 - 99 mg/dL 140   106   118    ?BUN 6 - 23 mg/dL 21   19   15     ?Creatinine 0.40 - 1.20 mg/dL 0.97   0.84   0.89    ?Sodium 135 - 145 mEq/L 139   141   142    ?Potassium 3.5 - 5.1 mEq/L 3.9   4.2   4.1    ?Chloride 96 - 112 mEq/L 100   105   105    ?CO2 19 - 32 mEq/L 31   29   27     ?Calcium 8.4 - 10.5 mg/dL 10.8   9.7   9.8    ?Total Protein 6.0 - 8.3 g/dL 7.6    7.5    ?Total Bilirubin 0.2 - 1.2 mg/dL 0.9    0.6    ?Alkaline Phos 39 - 117 U/L 67    77    ?AST 0 - 37 U/L 21    19    ?ALT 0 - 35 U/L 16    15    ? ? ?Previous GI Evaluations  ? ?Endoscopies: ? ?Sept 2016 screening colonoscopy  ?-- Diverticulosis.  A 5 mm sessile polyp was removed.  Polyp was hyperplastic ? ?Past Medical History:  ?Diagnosis  Date  ? Anemia   ? hx of  ? Anginal pain ([place])   ? "left arm pain, sees Dr. Etter Sjogren, had card cath 2013"  ? Arthritis   ? "all over" (09/14/2012)  ? Asthma   ? takes inhaler 2x day  ? Bronchitis   ? hx of  ? Bulging disc   ? "lower"  ? Carpal tunnel syndrome of left wrist   ? Depression   ? denies  ? Exertional dyspnea   ? "sometimes laying down" (09/14/2012)  ? Headache   ? frequent headaches,usually if she does not eat  ? Hyperlipidemia   ? Hypertension   ? sees Dr. Debby Freiberg, primary  ? Pneumonia 03/2012  ? PONV (postoperative nausea and vomiting)   ? Thyroid disease 1960's  ? "don't have it now" (09/14/2012)  ? Type II diabetes mellitus ([place])   ? Urinary tract infection   ? hx of  ? Vomiting   ? pt states she vomits every am  ? ?Past Surgical History:  ?Procedure Laterality Date  ? CARDIAC CATHETERIZATION  05/13/2012  ? mod luminal irregularity of pLAD, no sign CAD, EF 65%.  ? CHOLECYSTECTOMY  1970's  ? KYPHOPLASTY N/A 02/10/2013  ? Procedure:  LUMBAR TWO KYPHOPLASTY;  Surgeon: Ophelia Charter, MD;  Location: [facility];  Service: Neurosurgery;  Laterality: N/A;  L2 Kyphoplasty; Will use Stern's Carm.  ? ORIF HUMERUS FRACTURE Right 01/06/2018  ? Procedure: OPEN REDUCTION INTERNAL FIXATION (ORIF) RIGHT PROXIMAL HUMERUS FRACTURE;  Surgeon: Leandrew Koyanagi, MD;  Location: [facility];  Service: Orthopedics;  Laterality: Right;  ? TOTAL ELBOW REPLACEMENT  ~ 2005  ? "right" (09/14/2012)  ? TOTAL KNEE ARTHROPLASTY  09/13/2012  ? Procedure: TOTAL KNEE ARTHROPLASTY;  Surgeon: Vickey Huger, MD;  Location: [facility];  Service: Orthopedics;  Laterality: Right;  ? TOTAL KNEE ARTHROPLASTY Left 08/02/2018  ? Procedure: LEFT TOTAL KNEE ARTHROPLASTY;  Surgeon: Leandrew Koyanagi, MD;  Location: [facility];  Service: Orthopedics;  Laterality: Left;  ? TUBAL LIGATION  1970's  ? VAGINAL HYSTERECTOMY  1970's  ? ?Family History  ?Problem Relation Age of Onset  ? Arthritis  Mother   ? Arthritis Father   ? Hypertension Father   ? Diabetes Father   ? Colon cancer  Neg Hx   ? ?Social History  ? ?Tobacco Use  ? Smoking status: Former  ?  Packs/day: 0.25  ?  Years: 20.00  ?  Pack years: 5.00  ?  Types: Cigarettes  ?  Quit date: 10/13/2009  ?  Years since quitting: 12.3  ? Smokeless tobacco: Never  ?Vaping Use  ? Vaping Use: Never used  ?Substance Use Topics  ? Alcohol use: No  ?  Comment: 09/14/2012 "did drink a little in my younger days"  ? Drug use: No  ? ?Current Outpatient Medications  ?Medication Sig Dispense Refill  ? albuterol (PROVENTIL) (2.5 MG/3ML) 0.083% nebulizer solution Inhale 2.5 mg into the lungs every 6 (six) hours as needed for wheezing or shortness of breath. Reported on 01/17/2016    ? atorvastatin (LIPITOR) 20 MG tablet TAKE 1 TABLET BY MOUTH DAILY 30 tablet 5  ? Blood Glucose Monitoring Suppl (ONETOUCH VERIO IQ SYSTEM) w/Device KIT 1 Act by Does not apply route 3 (three) times daily. 2 kit 1  ? dexlansoprazole (DEXILANT) 60 MG capsule Take 1 capsule (60 mg total) by mouth daily. 120 capsule 1  ? glucose blood (ONETOUCH VERIO) test strip USE TO TEST BLOOD SUGAR 3 TIMES DAILY 100 each 10  ? irbesartan (AVAPRO) 300 MG tablet TAKE 1 TABLET(300 MG) BY MOUTH DAILY 90 tablet 1  ? Lancets (ONETOUCH ULTRASOFT) lancets 1 each by Other route 3 (three) times daily. Use as instructed 300 each 1  ? metFORMIN (GLUCOPHAGE) 1000 MG tablet Take 1 tablet (1,000 mg total) by mouth 2 (two) times daily with a meal. 180 tablet 0  ? metroNIDAZOLE (METROGEL) 0.75 % gel Apply 1 application topically at bedtime. 45 g 1  ? Semaglutide, 2 MG/DOSE, 8 MG/3ML SOPN Inject 2 mg as directed once a week. 9 mL 1  ? SURE COMFORT PEN NEEDLES 32G X 4 MM MISC USE 1 PEN NEEDLE ONCE A WEEK 100 each 10  ? ?No current facility-administered medications for this visit.  ? ?Allergies  ?Allergen Reactions  ? Lisinopril Shortness Of Breath  ? Amlodipine Swelling  ?  UNSPECIFIED EDEMA  ? Tape Other (See Comments)  ?  Tore skin--?hypofix  ? Morphine And Related Nausea And Vomiting  ? Percocet  [Oxycodone-Acetaminophen] Nausea And Vomiting  ? ? ? ?Review of Systems: ?Positive for arthritis, back pain, vision changes, hearing problems, headaches, night sweats, swelling of feet and legs, urine leakage . All othe

## 2022-01-28 NOTE — Patient Instructions (Signed)
Acid Reflux ?--If you are taking anti-reflux ( GERD) medication be sure to take it 30 minutes before breakfast and if taking twice daily then also 30 minutes before dinner.   ?--Avoid late meals / bedtime snacks.   ?--Avoid trigger foods ( foods which you know tend to aggravate you reflux symptoms). Some of the more common triggers include spicy foods, fatty foods, acidic foods, chocolate and caffeine.  ?--Elevate the head of bed 6-8 inches on blocks or bricks. If not able to elevate the head of the bed consider purchasing a wedge pillow to sleep on.    ?--Weight reduction / maintain a healthy BMI ( body mass index) may be help with reflux symptoms  ?---Sometimes with the above mentioned "lifestyle changes" patients are able to reduce the amount of GERD medications they take. Our goal would be to have you on the lowest effective dose of medication ? ? ?We have sent the following medications to your pharmacy for you to pick up at your convenience: Omeprazole 40 mg ? ?You have been scheduled for a Barium Esophogram at [facility] (1st floor of the hospital) on _____________ at ___________________. Please arrive 15 minutes prior to your appointment for registration. Make certain not to have anything to eat or drink 3 hours prior to your test. If you need to reschedule for any reason, please contact radiology at [phone] to do so. ?__________________________________________________________________ ?A barium swallow is an examination that concentrates on views of the esophagus. This tends to be a double contrast exam (barium and two liquids which, when combined, create a gas to distend the wall of the oesophagus) or single contrast (non-ionic iodine based). The study is usually tailored to your symptoms so a good history is essential. Attention is paid during the study to the form, structure and configuration of the esophagus, looking for functional disorders (such as aspiration, dysphagia, achalasia,  motility and reflux) ?EXAMINATION ?You may be asked to change into a gown, depending on the type of swallow being performed. A radiologist and radiographer will perform the procedure. The radiologist will advise you of the ?type of contrast selected for your procedure and direct you during the exam. You will be asked to stand, sit or lie in several different positions and to hold a small amount of fluid in your mouth before being asked to swallow while the imaging is performed .In some instances you may be asked to swallow barium coated marshmallows to assess the motility of a solid food bolus. ?The exam can be recorded as a digital or video fluoroscopy procedure. ?POST PROCEDURE ?It will take 1-2 days for the barium to pass through your system. To facilitate this, it is important, unless otherwise directed, to increase your fluids for the next 24-48hrs and to resume your normal diet.  ?This test typically takes about 30 minutes to perform. ? ?You will be contacted by [facility] in the next 2 days to arrange a Barium Esophagram/DG Esophagus.  The number on your caller ID will be [phone], please answer when they call.  If you have not heard from them in 2 days please call [phone] to schedule.    ? ?Due to recent changes in healthcare laws, you may see the results of your imaging and laboratory studies on MyChart before your provider has had a chance to review them.  We understand that in some cases there may be results that are confusing or concerning to you. Not all laboratory results come back in the  same time frame and the provider may be waiting for multiple results in order to interpret others.  Please give [place] 48 hours in order for your provider to thoroughly review all the results before contacting the office for clarification of your results.   ? ?If you are age 3 or older, your body mass index should be between 23-30. Your Body mass index is 36.79 kg/m?Marland Kitchen If this is out of the  aforementioned range listed, please consider follow up with your Primary Care Provider. ? ?If you are age 55 or younger, your body mass index should be between 19-25. Your Body mass index is 36.79 kg/m?Marland Kitchen If this is out of the aformentioned range listed, please consider follow up with your Primary Care Provider.  ? ?________________________________________________________ ? ?The [facility] providers would like to encourage you to use [facility] to communicate with providers for non-urgent requests or questions.  Due to long hold times on the telephone, sending your provider a message by [facility] may be a faster and more efficient way to get a response.  Please allow 48 business hours for a response.  Please remember that this is for non-urgent requests.  ?_______________________________________________________  ? ?Thank you for choosing [facility] ? ?Abigail Collins  ?__________________________________________________________________________________  ?

## 2022-02-13 ENCOUNTER — Other Ambulatory Visit: Payer: Self-pay | Admitting: Internal Medicine

## 2022-02-13 DIAGNOSIS — I10 Essential (primary) hypertension: Secondary | ICD-10-CM

## 2022-03-07 ENCOUNTER — Ambulatory Visit (INDEPENDENT_AMBULATORY_CARE_PROVIDER_SITE_OTHER): Payer: HMO

## 2022-03-07 ENCOUNTER — Ambulatory Visit: Payer: HMO

## 2022-03-07 DIAGNOSIS — Z0000 Encounter for general adult medical examination without abnormal findings: Secondary | ICD-10-CM | POA: Diagnosis not present

## 2022-03-07 NOTE — Patient Instructions (Signed)
Abigail Collins , Thank you for taking time to come for your Medicare Wellness Visit. I appreciate your ongoing commitment to your health goals. Please review the following plan we discussed and let me know if I can assist you in the future.   Screening recommendations/referrals: Colonoscopy: 07/11/2015; due every 10 years Mammogram: declined Bone Density: 11/12/2012 Recommended yearly ophthalmology/optometry visit for glaucoma screening and checkup Recommended yearly dental visit for hygiene and checkup  Vaccinations: Influenza vaccine: 08/27/2021 Pneumococcal vaccine: 08/05/2018, 10/31/2019 Tdap vaccine: 10/31/2019; due every 10 years Shingles vaccine: never done   Covid-19: 12/11/2019, 01/10/2020  Advanced directives: No  Conditions/risks identified: Yes; Type II Diabetes Mellitus  Next appointment: Please schedule your next Medicare Wellness Visit with your Nurse Health Advisor in 1 year by calling [phone].   Preventive Care 70 Years and Older, Female Preventive care refers to lifestyle choices and visits with your health care provider that can promote health and wellness. What does preventive care include? A yearly physical exam. This is also called an annual well check. Dental exams once or twice a year. Routine eye exams. Ask your health care provider how often you should have your eyes checked. Personal lifestyle choices, including: Daily care of your teeth and gums. Regular physical activity. Eating a healthy diet. Avoiding tobacco and drug use. Limiting alcohol use. Practicing safe sex. Taking low-dose aspirin every day. Taking vitamin and mineral supplements as recommended by your health care provider. What happens during an annual well check? The services and screenings done by your health care provider during your annual well check will depend on your age, overall health, lifestyle risk factors, and family history of disease. Counseling  Your health care provider may  ask you questions about your: Alcohol use. Tobacco use. Drug use. Emotional well-being. Home and relationship well-being. Sexual activity. Eating habits. History of falls. Memory and ability to understand (cognition). Work and work Statistician. Reproductive health. Screening  You may have the following tests or measurements: Height, weight, and BMI. Blood pressure. Lipid and cholesterol levels. These may be checked every 5 years, or more frequently if you are over 49 years old. Skin check. Lung cancer screening. You may have this screening every year starting at age 17 if you have a 30-pack-year history of smoking and currently smoke or have quit within the past 15 years. Fecal occult blood test (FOBT) of the stool. You may have this test every year starting at age 84. Flexible sigmoidoscopy or colonoscopy. You may have a sigmoidoscopy every 5 years or a colonoscopy every 10 years starting at age 97. Hepatitis C blood test. Hepatitis B blood test. Sexually transmitted disease (STD) testing. Diabetes screening. This is done by checking your blood sugar (glucose) after you have not eaten for a while (fasting). You may have this done every 1-3 years. Bone density scan. This is done to screen for osteoporosis. You may have this done starting at age 89. Mammogram. This may be done every 1-2 years. Talk to your health care provider about how often you should have regular mammograms. Talk with your health care provider about your test results, treatment options, and if necessary, the need for more tests. Vaccines  Your health care provider may recommend certain vaccines, such as: Influenza vaccine. This is recommended every year. Tetanus, diphtheria, and acellular pertussis (Tdap, Td) vaccine. You may need a Td booster every 10 years. Zoster vaccine. You may need this after age 83. Pneumococcal 13-valent conjugate (PCV13) vaccine. One dose is recommended after age 74.  Pneumococcal  polysaccharide (PPSV23) vaccine. One dose is recommended after age 52. Talk to your health care provider about which screenings and vaccines you need and how often you need them. This information is not intended to replace advice given to you by your health care provider. Make sure you discuss any questions you have with your health care provider. Document Released: 10/26/2015 Document Revised: 06/18/2016 Document Reviewed: 07/31/2015 Elsevier Interactive Patient Education  2017 [employer] Prevention in the Home Falls can cause injuries. They can happen to people of all ages. There are many things you can do to make your home safe and to help prevent falls. What can I do on the outside of my home? Regularly fix the edges of walkways and driveways and fix any cracks. Remove anything that might make you trip as you walk through a door, such as a raised step or threshold. Trim any bushes or trees on the path to your home. Use bright outdoor lighting. Clear any walking paths of anything that might make someone trip, such as rocks or tools. Regularly check to see if handrails are loose or broken. Make sure that both sides of any steps have handrails. Any raised decks and porches should have guardrails on the edges. Have any leaves, snow, or ice cleared regularly. Use sand or salt on walking paths during winter. Clean up any spills in your garage right away. This includes oil or grease spills. What can I do in the bathroom? Use night lights. Install grab bars by the toilet and in the tub and shower. Do not use towel bars as grab bars. Use non-skid mats or decals in the tub or shower. If you need to sit down in the shower, use a plastic, non-slip stool. Keep the floor dry. Clean up any water that spills on the floor as soon as it happens. Remove soap buildup in the tub or shower regularly. Attach bath mats securely with double-sided non-slip rug tape. Do not have throw rugs and other  things on the floor that can make you trip. What can I do in the bedroom? Use night lights. Make sure that you have a light by your bed that is easy to reach. Do not use any sheets or blankets that are too big for your bed. They should not hang down onto the floor. Have a firm chair that has side arms. You can use this for support while you get dressed. Do not have throw rugs and other things on the floor that can make you trip. What can I do in the kitchen? Clean up any spills right away. Avoid walking on wet floors. Keep items that you use a lot in easy-to-reach places. If you need to reach something above you, use a strong step stool that has a grab bar. Keep electrical cords out of the way. Do not use floor polish or wax that makes floors slippery. If you must use wax, use non-skid floor wax. Do not have throw rugs and other things on the floor that can make you trip. What can I do with my stairs? Do not leave any items on the stairs. Make sure that there are handrails on both sides of the stairs and use them. Fix handrails that are broken or loose. Make sure that handrails are as long as the stairways. Check any carpeting to make sure that it is firmly attached to the stairs. Fix any carpet that is loose or worn. Avoid having throw rugs at the  top or bottom of the stairs. If you do have throw rugs, attach them to the floor with carpet tape. Make sure that you have a light switch at the top of the stairs and the bottom of the stairs. If you do not have them, ask someone to add them for you. What else can I do to help prevent falls? Wear shoes that: Do not have high heels. Have rubber bottoms. Are comfortable and fit you well. Are closed at the toe. Do not wear sandals. If you use a stepladder: Make sure that it is fully opened. Do not climb a closed stepladder. Make sure that both sides of the stepladder are locked into place. Ask someone to hold it for you, if possible. Clearly  mark and make sure that you can see: Any grab bars or handrails. First and last steps. Where the edge of each step is. Use tools that help you move around (mobility aids) if they are needed. These include: Canes. Walkers. Scooters. Crutches. Turn on the lights when you go into a dark area. Replace any light bulbs as soon as they burn out. Set up your furniture so you have a clear path. Avoid moving your furniture around. If any of your floors are uneven, fix them. If there are any pets around you, be aware of where they are. Review your medicines with your doctor. Some medicines can make you feel dizzy. This can increase your chance of falling. Ask your doctor what other things that you can do to help prevent falls. This information is not intended to replace advice given to you by your health care provider. Make sure you discuss any questions you have with your health care provider. Document Released: 07/26/2009 Document Revised: 03/06/2016 Document Reviewed: 11/03/2014 Elsevier Interactive Patient Education  2017 [employer].

## 2022-03-07 NOTE — Progress Notes (Signed)
I connected with Abigail Collins today by telephone and verified that I am speaking with the correct person using two identifiers. Location patient: home Location provider: work Persons participating in the virtual visit: patient, provider.   I discussed the limitations, risks, security and privacy concerns of performing an evaluation and management service by telephone and the availability of in person appointments. I also discussed with the patient that there may be a patient responsible charge related to this service. The patient expressed understanding and verbally consented to this telephonic visit.    Interactive audio and video telecommunications were attempted between this provider and patient, however failed, due to patient having technical difficulties OR patient did not have access to video capability.  We continued and completed visit with audio only.  Some vital signs may be absent or patient reported.   Time Spent with patient on telephone encounter: 30 minutes  Subjective:   Abigail Collins is a 71 y.o. female who presents for Medicare Annual (Subsequent) preventive examination.  Review of Systems     Cardiac Risk Factors include: advanced age (>36mn, >>69women);diabetes mellitus;dyslipidemia;hypertension;obesity (BMI >30kg/m2);sedentary lifestyle     Objective:    There were no vitals filed for this visit. There is no height or weight on file to calculate BMI.     03/07/2022    2:04 PM 08/29/2020   12:35 PM 08/02/2018    8:27 PM 08/02/2018    5:59 AM 07/26/2018    2:22 PM 01/06/2018   11:20 PM 12/27/2017    4:00 PM  Advanced Directives  Does Patient Have a Medical Advance Directive? No No No No No No No  Would patient like information on creating a medical advance directive? No - Patient declined Yes (MAU/Ambulatory/Procedural Areas - Information given) No - Patient declined No - Patient declined Yes (MAU/Ambulatory/Procedural Areas - Information given) Yes  (Inpatient - patient requests chaplain consult to create a medical advance directive) No - Patient declined    Current Medications (verified) Outpatient Encounter Medications as of 03/07/2022  Medication Sig   albuterol (PROVENTIL) (2.5 MG/3ML) 0.083% nebulizer solution Inhale 2.5 mg into the lungs every 6 (six) hours as needed for wheezing or shortness of breath. Reported on 01/17/2016   atorvastatin (LIPITOR) 20 MG tablet TAKE 1 TABLET BY MOUTH DAILY   Blood Glucose Monitoring Suppl (ONETOUCH VERIO IQ SYSTEM) w/Device KIT 1 Act by Does not apply route 3 (three) times daily.   glucose blood (ONETOUCH VERIO) test strip USE TO TEST BLOOD SUGAR 3 TIMES DAILY   irbesartan (AVAPRO) 300 MG tablet TAKE 1 TABLET BY MOUTH DAILY   Lancets (ONETOUCH ULTRASOFT) lancets 1 each by Other route 3 (three) times daily. Use as instructed   metFORMIN (GLUCOPHAGE) 1000 MG tablet Take 1 tablet (1,000 mg total) by mouth 2 (two) times daily with a meal.   metroNIDAZOLE (METROGEL) 0.75 % gel Apply 1 application topically at bedtime.   omeprazole (PRILOSEC) 40 MG capsule Take 1 capsule (40 mg total) by mouth daily. 30 minutes before lunch   Semaglutide, 2 MG/DOSE, 8 MG/3ML SOPN Inject 2 mg as directed once a week.   SURE COMFORT PEN NEEDLES 32G X 4 MM MISC USE 1 PEN NEEDLE ONCE A WEEK   No facility-administered encounter medications on file as of 03/07/2022.    Allergies (verified) Lisinopril, Amlodipine, Tape, Morphine and related, and Percocet [oxycodone-acetaminophen]   History: Past Medical History:  Diagnosis Date   Anemia    hx of   Anginal pain ([place]    "  left arm pain, sees Dr. Etter Sjogren, had card cath 2013"   Arthritis    "all over" (09/14/2012)   Asthma    takes inhaler 2x day   Bronchitis    hx of   Bulging disc    "lower"   Carpal tunnel syndrome of left wrist    Depression    denies   Exertional dyspnea    "sometimes laying down" (09/14/2012)   Headache    frequent headaches,usually if she  does not eat   Hyperlipidemia    Hypertension    sees Dr. Debby Freiberg, primary   Pneumonia 03/2012   PONV (postoperative nausea and vomiting)    Thyroid disease 1960's   "don't have it now" (09/14/2012)   Type II diabetes mellitus ([place])    Urinary tract infection    hx of   Vomiting    pt states she vomits every am   Past Surgical History:  Procedure Laterality Date   CARDIAC CATHETERIZATION  05/13/2012   mod luminal irregularity of pLAD, no sign CAD, EF 65%.   CHOLECYSTECTOMY  1970's   KYPHOPLASTY N/A 02/10/2013   Procedure:  LUMBAR TWO KYPHOPLASTY;  Surgeon: Ophelia Charter, MD;  Location: [facility];  Service: Neurosurgery;  Laterality: N/A;  L2 Kyphoplasty; Will use Stern's Carm.   ORIF HUMERUS FRACTURE Right 01/06/2018   Procedure: OPEN REDUCTION INTERNAL FIXATION (ORIF) RIGHT PROXIMAL HUMERUS FRACTURE;  Surgeon: Leandrew Koyanagi, MD;  Location: [place];  Service: Orthopedics;  Laterality: Right;   TOTAL ELBOW REPLACEMENT  ~ 2005   "right" (09/14/2012)   TOTAL KNEE ARTHROPLASTY  09/13/2012   Procedure: TOTAL KNEE ARTHROPLASTY;  Surgeon: Vickey Huger, MD;  Location: [facility];  Service: Orthopedics;  Laterality: Right;   TOTAL KNEE ARTHROPLASTY Left 08/02/2018   Procedure: LEFT TOTAL KNEE ARTHROPLASTY;  Surgeon: Leandrew Koyanagi, MD;  Location: [place];  Service: Orthopedics;  Laterality: Left;   TUBAL LIGATION  1970's   VAGINAL HYSTERECTOMY  103's   Family History  Problem Relation Age of Onset   Arthritis Mother    Arthritis Father    Hypertension Father    Diabetes Father    Colon cancer Neg Hx    Social History   Socioeconomic History   Marital status: Married    Spouse name: Not on file   Number of children: Not on file   Years of education: Not on file   Highest education level: Not on file  Occupational History   Not on file  Tobacco Use   Smoking status: Former    Packs/day: 0.25    Years: 20.00    Pack years: 5.00    Types: Cigarettes    Quit date: 10/13/2009     Years since quitting: 12.4   Smokeless tobacco: Never  Vaping Use   Vaping Use: Never used  Substance and Sexual Activity   Alcohol use: No    Comment: 09/14/2012 "did drink a little in my younger days"   Drug use: No   Sexual activity: Not Currently  Other Topics Concern   Not on file  Social History Narrative   Not on file   Social Determinants of Health   Financial Resource Strain: Low Risk    Difficulty of Paying Living Expenses: Not hard at all  Food Insecurity: No Food Insecurity   Worried About Charity fundraiser in the Last Year: Never true   Pleak in the Last Year: Never true  Transportation Needs: No  Transportation Needs   Lack of Transportation (Medical): No   Lack of Transportation (Non-Medical): No  Physical Activity: Inactive   Days of Exercise per Week: 0 days   Minutes of Exercise per Session: 0 min  Stress: No Stress Concern Present   Feeling of Stress : Not at all  Social Connections: Moderately Isolated   Frequency of Communication with Friends and Family: More than three times a week   Frequency of Social Gatherings with Friends and Family: Never   Attends Religious Services: Never   Marine scientist or Organizations: No   Attends Music therapist: Never   Marital Status: Married    Tobacco Counseling Counseling given: Not Answered   Clinical Intake:  Pre-visit preparation completed: Yes  Pain : No/denies pain     BMI - recorded: 36.79 Nutritional Status: BMI > 30  Obese Nutritional Risks: None Diabetes: No  How often do you need to have someone help you when you read instructions, pamphlets, or other written materials from your doctor or pharmacy?: 1 - Never What is the last grade level you completed in school?: HSG  Diabetic? yes  Interpreter Needed?: No  Information entered by :: Lisette Abu, LPN.   Activities of Daily Living    03/07/2022    2:17 PM  In your present state of health, do you  have any difficulty performing the following activities:  Hearing? 1  Comment left ear  Vision? 0  Difficulty concentrating or making decisions? 0  Walking or climbing stairs? 0  Dressing or bathing? 0  Doing errands, shopping? 0  Preparing Food and eating ? N  Using the Toilet? N  In the past six months, have you accidently leaked urine? N  Do you have problems with loss of bowel control? N  Managing your Medications? N  Managing your Finances? N  Housekeeping or managing your Housekeeping? N    Patient Care Team: Janith Lima, MD as PCP - General (Internal Medicine) Charlton Haws, [facility] as Pharmacist (Pharmacist) Lavonna Monarch, MD as Consulting Physician (Dermatology)  Indicate any recent Medical Services you may have received from other than [facility] providers in the past year (date may be approximate).     Assessment:   This is a routine wellness examination for Abigail Collins.  Hearing/Vision screen Hearing Screening - Comments:: Patient has hearing difficulty in left ear. No hearing aids. Vision Screening - Comments:: Patient does wear corrective lenses/contacts.  Eye exam done by: [facility]    Dietary issues and exercise activities discussed: Current Exercise Habits: The patient does not participate in regular exercise at present   Goals Addressed             This Visit's Progress    My goal is to lose 50 pounds by taking Ozempic.        Depression Screen    03/07/2022    2:09 PM 01/24/2021   11:10 AM 08/29/2020   12:00 PM 10/31/2019    2:18 PM 04/12/2019    1:08 PM 05/09/2018    5:53 PM 05/27/2017    2:17 PM  PHQ 2/9 Scores  PHQ - 2 Score 0 0 0 0 0 0 1    Fall Risk    03/07/2022    2:05 PM 09/27/2021   10:30 AM 01/24/2021   11:10 AM 10/31/2019    2:18 PM 04/12/2019    1:08 PM  [place] in the past year? 0 0 0 0  0  Number falls in past yr: 0 0 0 0 0  Injury with Fall? 0 0 0 0 0  Comment  N/A- no falls reported     Risk for fall  due to : No Fall Risks History of fall(s);Impaired balance/gait     Follow up Falls evaluation completed Falls prevention discussed  Falls evaluation completed Falls evaluation completed    FALL RISK PREVENTION PERTAINING TO THE HOME:  Any stairs in or around the home? No  If so, are there any without handrails? No  Home free of loose throw rugs in walkways, pet beds, electrical cords, etc? Yes  Adequate lighting in your home to reduce risk of falls? Yes   ASSISTIVE DEVICES UTILIZED TO PREVENT FALLS:  Life alert? No  Use of a cane, walker or w/c? Yes  Grab bars in the bathroom? Yes  Shower chair or bench in shower? No  Elevated toilet seat or a handicapped toilet? Yes   TIMED UP AND GO:  Was the test performed? No .  Length of time to ambulate 10 feet: n/a sec.   Appearance of gait: Patient not evaluated for gait during this visit.  Cognitive Function:        03/07/2022    2:20 PM  6CIT Screen  What Year? 0 points  What month? 0 points  What time? 0 points  Count back from 20 0 points  Months in reverse 0 points  Repeat phrase 0 points  Total Score 0 points    Immunizations Immunization History  Administered Date(s) Administered   Fluad Quad(high Dose 65+) 07/08/2019, 08/27/2021   Influenza Split 08/05/2012   Influenza, High Dose Seasonal PF 07/02/2017, 07/02/2018   Influenza,inj,Quad PF,6+ Mos 09/03/2015, 07/29/2016   PFIZER(Purple Top)SARS-COV-2 Vaccination 12/11/2019, 01/10/2020   Pneumococcal Conjugate-13 10/31/2019   Pneumococcal Polysaccharide-23 04/06/2012, 08/05/2018   Tdap 10/31/2019    TDAP status: Up to date  Flu Vaccine status: Up to date  Pneumococcal vaccine status: Up to date  Covid-19 vaccine status: Completed vaccines  Qualifies for Shingles Vaccine? Yes   Zostavax completed No   Shingrix Completed?: No.    Education has been provided regarding the importance of this vaccine. Patient has been advised to call insurance company to  determine out of pocket expense if they have not yet received this vaccine. Advised may also receive vaccine at local pharmacy or Health Dept. Verbalized acceptance and understanding.  Screening Tests Health Maintenance  Topic Date Due   Zoster Vaccines- Shingrix (1 of 2) Never done   COVID-19 Vaccine (3 - Pfizer risk series) 02/07/2020   OPHTHALMOLOGY EXAM  11/01/2020   MAMMOGRAM  04/11/2024 (Originally 02/26/2002)   INFLUENZA VACCINE  05/13/2022   FOOT EXAM  05/23/2022   HEMOGLOBIN A1C  07/16/2022   COLONOSCOPY (Pts 45-65yr Insurance coverage will need to be confirmed)  07/10/2025   TETANUS/TDAP  10/30/2029   Pneumonia Vaccine 70 Years old  Completed   DEXA SCAN  Completed   Hepatitis C Screening  Completed   HPV VACCINES  Aged Out    Health Maintenance  Health Maintenance Due  Topic Date Due   Zoster Vaccines- Shingrix (1 of 2) Never done   COVID-19 Vaccine (3 - Pfizer risk series) 02/07/2020   OPHTHALMOLOGY EXAM  11/01/2020    Colorectal cancer screening: Type of screening: Colonoscopy. Completed 07/11/2015. Repeat every 10 years  Mammogram status: No longer required due to patient refusal.  Bone density scan: refused  Lung Cancer Screening: (Low Dose CT Chest  recommended if Age 60-80 years, 55 pack-year currently smoking OR have quit w/in 15years.) does not qualify.   Lung Cancer Screening Referral: no  Additional Screening:  Hepatitis C Screening: does qualify; Completed 04/12/2019  Vision Screening: Recommended annual ophthalmology exams for early detection of glaucoma and other disorders of the eye. Is the patient up to date with their annual eye exam?  Yes  Who is the provider or what is the name of the office in which the patient attends annual eye exams? [facility] Nicki Reaper, MD. If pt is not established with a provider, would they like to be referred to a provider to establish care? No .   Dental Screening: Recommended annual dental exams for proper oral  hygiene  Community Resource Referral / Chronic Care Management: CRR required this visit?  No   CCM required this visit?  No      Plan:     I have personally reviewed and noted the following in the patient's chart:   Medical and social history Use of alcohol, tobacco or illicit drugs  Current medications and supplements including opioid prescriptions.  Functional ability and status Nutritional status Physical activity Advanced directives List of other physicians Hospitalizations, surgeries, and ER visits in previous 12 months Vitals Screenings to include cognitive, depression, and falls Referrals and appointments  In addition, I have reviewed and discussed with patient certain preventive protocols, quality metrics, and best practice recommendations. A written personalized care plan for preventive services as well as general preventive health recommendations were provided to patient.     Sheral Flow, LPN   [id number]   Nurse Notes:  There were no vitals filed for this visit. There is no height or weight on file to calculate BMI.

## 2022-03-11 ENCOUNTER — Ambulatory Visit (HOSPITAL_COMMUNITY)
Admission: RE | Admit: 2022-03-11 | Discharge: 2022-03-11 | Disposition: A | Discharge status: Home / Self Care | Payer: PPO | Source: Ambulatory Visit | Attending: Nurse Practitioner | Admitting: Nurse Practitioner

## 2022-03-11 DIAGNOSIS — R1310 Dysphagia, unspecified: Secondary | ICD-10-CM | POA: Diagnosis not present

## 2022-03-11 DIAGNOSIS — K219 Gastro-esophageal reflux disease without esophagitis: Secondary | ICD-10-CM | POA: Insufficient documentation

## 2022-03-11 DIAGNOSIS — R1319 Other dysphagia: Secondary | ICD-10-CM | POA: Diagnosis not present

## 2022-03-11 DIAGNOSIS — K224 Dyskinesia of esophagus: Secondary | ICD-10-CM | POA: Diagnosis not present

## 2022-03-11 DIAGNOSIS — K449 Diaphragmatic hernia without obstruction or gangrene: Secondary | ICD-10-CM | POA: Diagnosis not present

## 2022-03-14 ENCOUNTER — Ambulatory Visit: Payer: HMO | Admitting: Nurse Practitioner

## 2022-03-16 ENCOUNTER — Other Ambulatory Visit: Payer: Self-pay | Admitting: Internal Medicine

## 2022-03-16 DIAGNOSIS — E118 Type 2 diabetes mellitus with unspecified complications: Secondary | ICD-10-CM

## 2022-03-17 ENCOUNTER — Ambulatory Visit: Payer: HMO | Admitting: Dermatology

## 2022-03-19 NOTE — Progress Notes (Signed)
Msg per Dr .Carlean Purl: Carlean Purl, Abigail Neas, MD  Marice Potter, RN Cc: Noralyn Pick, NP; Willia Craze, NP Please tell him there is nothing dangerous or bad found here.  She has a follow-up with Jaclyn Shaggy later this month.  If the omeprazole did not fix her symptoms we will schedule an EGD.  She is on Ozempic and that will need to be held prior to the EGD

## 2022-04-02 NOTE — Progress Notes (Deleted)
04/02/2022 Abigail Collins [id number] 03-28-1951   Chief Complaint:  History of Present Illness: Abigail Collins is a 71 year old female with a past medical history of hypertension, hyperlipidemia, asthma, DM II, squamous cell cancer right forearm, diverticulosis and a hyperplastic polyp.   She was seen by Tye Savoy NP on 01/28/2022 due to having solid food dysphagia, regurgitation, throat burning, early satiety and nausea. On Ozempic.   Barium swallow with tablet study done 03/11/2022 showed mild esophageal dysmotility without evidence of an esophageal stricture. A small hiatal hernia was noted.  Per Dr. Carlean Purl:  If the omeprazole did not fix her symptoms we will schedule an EGD.  She is on Ozempic and that will need to be held prior to the EGD         Latest Ref Rng & Units 05/23/2021    3:59 PM 01/24/2021   12:11 PM 04/12/2019    1:54 PM  CBC  WBC 4.0 - 10.5 K/uL 6.8  6.8  8.8   Hemoglobin 12.0 - 15.0 g/dL 13.2  13.5  14.3   Hematocrit 36.0 - 46.0 % 40.5  41.0  42.5   Platelets 150.0 - 400.0 K/uL 175.0  220.0  205.0        Latest Ref Rng & Units 01/14/2022    1:50 PM 05/23/2021    3:59 PM 01/24/2021   12:11 PM  CMP  Glucose 70 - 99 mg/dL 140  106  118   BUN 6 - 23 mg/dL '21  19  15   '$ Creatinine 0.40 - 1.20 mg/dL 0.97  0.84  0.89   Sodium 135 - 145 mEq/L 139  141  142   Potassium 3.5 - 5.1 mEq/L 3.9  4.2  4.1   Chloride 96 - 112 mEq/L 100  105  105   CO2 19 - 32 mEq/L '31  29  27   '$ Calcium 8.4 - 10.5 mg/dL 10.8  9.7  9.8   Total Protein 6.0 - 8.3 g/dL 7.6   7.5   Total Bilirubin 0.2 - 1.2 mg/dL 0.9   0.6   Alkaline Phos 39 - 117 U/L 67   77   AST 0 - 37 U/L 21   19   ALT 0 - 35 U/L 16   15     Colonoscopy 07/11/2015:  1. Sessile polyp was found; polypectomy was performed with a cold snare 2. Moderate diverticulosis was noted in the left colon 3. The examination was otherwise normal - good prep first screening - HYPERPLASTIC POLYP. - THERE IS NO EVIDENCE OF  MALIGNANCY  Past Medical History:  Diagnosis Date   Anemia    hx of   Anginal pain ([place])    "left arm pain, sees Dr. Etter Sjogren, had card cath 2013"   Arthritis    "all over" (09/14/2012)   Asthma    takes inhaler 2x day   Bronchitis    hx of   Bulging disc    "lower"   Carpal tunnel syndrome of left wrist    Depression    denies   Exertional dyspnea    "sometimes laying down" (09/14/2012)   Headache    frequent headaches,usually if she does not eat   Hyperlipidemia    Hypertension    sees Dr. Debby Freiberg, primary   Pneumonia 03/2012   PONV (postoperative nausea and vomiting)    Thyroid disease 1960's   "don't have it now" (09/14/2012)   Type II diabetes mellitus ([place])  Urinary tract infection    hx of   Vomiting    pt states she vomits every am   Past Surgical History:  Procedure Laterality Date   CARDIAC CATHETERIZATION  05/13/2012   mod luminal irregularity of pLAD, no sign CAD, EF 65%.   CHOLECYSTECTOMY  1970's   KYPHOPLASTY N/A 02/10/2013   Procedure:  LUMBAR TWO KYPHOPLASTY;  Surgeon: Ophelia Charter, MD;  Location: [facility];  Service: Neurosurgery;  Laterality: N/A;  L2 Kyphoplasty; Will use Stern's Carm.   ORIF HUMERUS FRACTURE Right 01/06/2018   Procedure: OPEN REDUCTION INTERNAL FIXATION (ORIF) RIGHT PROXIMAL HUMERUS FRACTURE;  Surgeon: Leandrew Koyanagi, MD;  Location: [facility];  Service: Orthopedics;  Laterality: Right;   TOTAL ELBOW REPLACEMENT  ~ 2005   "right" (09/14/2012)   TOTAL KNEE ARTHROPLASTY  09/13/2012   Procedure: TOTAL KNEE ARTHROPLASTY;  Surgeon: Vickey Huger, MD;  Location: [facility];  Service: Orthopedics;  Laterality: Right;   TOTAL KNEE ARTHROPLASTY Left 08/02/2018   Procedure: LEFT TOTAL KNEE ARTHROPLASTY;  Surgeon: Leandrew Koyanagi, MD;  Location: [facility];  Service: Orthopedics;  Laterality: Left;   TUBAL LIGATION  1970's   VAGINAL HYSTERECTOMY  1970's     Current Medications, Allergies, Past Medical History, Past Surgical History, Family History and  Social History were reviewed in [employer] record.   Review of Systems:   Constitutional: Negative for fever, sweats, chills or weight loss.  Respiratory: Negative for shortness of breath.   Cardiovascular: Negative for chest pain, palpitations and leg swelling.  Gastrointestinal: See HPI.  Musculoskeletal: Negative for back pain or muscle aches.  Neurological: Negative for dizziness, headaches or paresthesias.    Physical Exam: There were no vitals taken for this visit. General: Well developed, w   ***female in no acute distress. Head: Normocephalic and atraumatic. Eyes: No scleral icterus. Conjunctiva pink . Ears: Normal auditory acuity. Mouth: Dentition intact. No ulcers or lesions.  Lungs: Clear throughout to auscultation. Heart: Regular rate and rhythm, no murmur. Abdomen: Soft, nontender and nondistended. No masses or hepatomegaly. Normal bowel sounds x 4 quadrants.  Rectal: Deferred.  Musculoskeletal: Symmetrical with no gross deformities. Extremities: No edema. Neurological: Alert oriented x 4. No focal deficits.  Psychological: Alert and cooperative. Normal mood and affect  Assessment and Recommendations:  51) 71 year old female with dysphagia  -EGD   2) History of a hyperplastic colon polyp per colonoscopy 06/2015 -Next colonoscopy due 06/2025  3) DM II on Ozempic   4) Hypercalcemia, followed by Dr. Ronnald Ramp

## 2022-04-03 ENCOUNTER — Ambulatory Visit: Payer: HMO | Admitting: Nurse Practitioner

## 2022-04-08 ENCOUNTER — Ambulatory Visit: Payer: PPO | Admitting: Orthopaedic Surgery

## 2022-04-11 ENCOUNTER — Other Ambulatory Visit: Payer: Self-pay | Admitting: Internal Medicine

## 2022-04-11 DIAGNOSIS — I10 Essential (primary) hypertension: Secondary | ICD-10-CM

## 2022-04-17 ENCOUNTER — Encounter: Payer: Self-pay | Admitting: Internal Medicine

## 2022-04-17 ENCOUNTER — Ambulatory Visit (INDEPENDENT_AMBULATORY_CARE_PROVIDER_SITE_OTHER): Payer: HMO | Admitting: Internal Medicine

## 2022-04-17 VITALS — BP 130/80 | HR 90 | Temp 97.4°F | Resp 16 | Ht 63.0 in | Wt 212.0 lb

## 2022-04-17 DIAGNOSIS — M545 Low back pain, unspecified: Secondary | ICD-10-CM | POA: Diagnosis not present

## 2022-04-17 DIAGNOSIS — G8929 Other chronic pain: Secondary | ICD-10-CM | POA: Diagnosis not present

## 2022-04-17 DIAGNOSIS — M170 Bilateral primary osteoarthritis of knee: Secondary | ICD-10-CM | POA: Diagnosis not present

## 2022-04-17 DIAGNOSIS — T50905A Adverse effect of unspecified drugs, medicaments and biological substances, initial encounter: Secondary | ICD-10-CM | POA: Diagnosis not present

## 2022-04-17 DIAGNOSIS — E118 Type 2 diabetes mellitus with unspecified complications: Secondary | ICD-10-CM | POA: Diagnosis not present

## 2022-04-17 DIAGNOSIS — I10 Essential (primary) hypertension: Secondary | ICD-10-CM

## 2022-04-17 LAB — BASIC METABOLIC PANEL
BUN: 24 mg/dL — ABNORMAL HIGH (ref 6–23)
CO2: 29 mEq/L (ref 19–32)
Calcium: 9.9 mg/dL (ref 8.4–10.5)
Chloride: 102 mEq/L (ref 96–112)
Creatinine, Ser: 0.88 mg/dL (ref 0.40–1.20)
GFR: 66.72 mL/min (ref >60.00)
Glucose, Bld: 109 mg/dL — ABNORMAL HIGH (ref 70–99)
Potassium: 4.1 mEq/L (ref 3.5–5.1)
Sodium: 139 mEq/L (ref 135–145)

## 2022-04-17 MED ORDER — SEMAGLUTIDE (2 MG/DOSE) 8 MG/3ML ~~LOC~~ SOPN: 2.0000 mg | PEN_INJECTOR | SUBCUTANEOUS | 1 refills | Status: DC

## 2022-04-17 NOTE — Patient Instructions (Signed)

## 2022-04-17 NOTE — Progress Notes (Signed)
Subjective:  Patient ID: Abigail Collins, female    DOB: 1951/04/17  Age: 71 y.o. MRN: [id number]  CC: Hypertension and Diabetes   HPI Abigail Collins presents for f/up -  She complains of chronic, nonradiating low back pain and pain in her large joints.  She would like to take something for the pain.  She is active and denies chest pain, shortness of breath, diaphoresis, or edema.  Outpatient Medications Prior to Visit  Medication Sig Dispense Refill   albuterol (PROVENTIL) (2.5 MG/3ML) 0.083% nebulizer solution Inhale 2.5 mg into the lungs every 6 (six) hours as needed for wheezing or shortness of breath. Reported on 01/17/2016     atorvastatin (LIPITOR) 20 MG tablet TAKE 1 TABLET BY MOUTH DAILY 30 tablet 5   Blood Glucose Monitoring Suppl (ONETOUCH VERIO IQ SYSTEM) w/Device KIT 1 Act by Does not apply route 3 (three) times daily. 2 kit 1   glucose blood (ONETOUCH VERIO) test strip USE TO TEST BLOOD SUGAR 3 TIMES DAILY 100 each 10   irbesartan (AVAPRO) 300 MG tablet TAKE 1 TABLET BY MOUTH DAILY 30 tablet 5   Lancets (ONETOUCH ULTRASOFT) lancets 1 each by Other route 3 (three) times daily. Use as instructed 300 each 1   metFORMIN (GLUCOPHAGE) 1000 MG tablet TAKE 1 TABLET BY MOUTH TWICE DAILY WITH MEALS 60 tablet 2   metroNIDAZOLE (METROGEL) 0.75 % gel Apply 1 application topically at bedtime. 45 g 1   omeprazole (PRILOSEC) 40 MG capsule Take 1 capsule (40 mg total) by mouth daily. 30 minutes before lunch 30 capsule 3   SURE COMFORT PEN NEEDLES 32G X 4 MM MISC USE 1 PEN NEEDLE ONCE A WEEK 100 each 10   Semaglutide, 2 MG/DOSE, 8 MG/3ML SOPN Inject 2 mg as directed once a week. 9 mL 1   No facility-administered medications prior to visit.    ROS Review of Systems  Constitutional:  Negative for diaphoresis, fatigue and unexpected weight change.  HENT: Negative.    Eyes: Negative.   Respiratory:  Negative for cough, chest tightness, shortness of breath and wheezing.    Cardiovascular:  Negative for chest pain and palpitations.  Gastrointestinal:  Negative for abdominal pain, diarrhea, nausea and vomiting.  Genitourinary: Negative.  Negative for difficulty urinating.  Musculoskeletal:  Positive for arthralgias and back pain. Negative for myalgias and neck pain.  Skin: Negative.  Negative for color change and pallor.  Neurological:  Negative for dizziness, facial asymmetry, weakness and light-headedness.  Hematological:  Negative for adenopathy. Does not bruise/bleed easily.  Psychiatric/Behavioral: Negative.      Objective:  BP 130/80 (BP Location: Right Arm, Cuff Size: Large)   Pulse 90   Temp (!) 97.4 F (36.3 C) (Oral)   Resp 16   Ht 5' 3"  (1.6 m)   Wt 212 lb (96.2 kg)   SpO2 98%   BMI 37.55 kg/m   BP Readings from Last 3 Encounters:  04/18/22 130/80  01/28/22 124/76  01/14/22 126/76    Wt Readings from Last 3 Encounters:  04/18/22 212 lb (96.2 kg)  01/28/22 211 lb (95.7 kg)  01/14/22 210 lb (95.3 kg)    Physical Exam Vitals reviewed.  HENT:     Nose: Nose normal.     Mouth/Throat:     Mouth: Mucous membranes are moist.  Eyes:     General: No scleral icterus.    Conjunctiva/sclera: Conjunctivae normal.  Cardiovascular:     Rate and Rhythm: Normal rate and regular rhythm.  Pulses: Normal pulses.     Heart sounds: No murmur heard. Pulmonary:     Effort: Pulmonary effort is normal.     Breath sounds: No stridor. No wheezing, rhonchi or rales.  Abdominal:     General: Abdomen is flat.     Palpations: There is no mass.     Tenderness: There is no abdominal tenderness. There is no guarding.     Hernia: No hernia is present.  Musculoskeletal:        General: Deformity (djd) present. No swelling or tenderness.     Cervical back: Neck supple.     Right lower leg: No edema.     Left lower leg: No edema.  Lymphadenopathy:     Cervical: No cervical adenopathy.  Skin:    General: Skin is warm and dry.  Neurological:      General: No focal deficit present.     Mental Status: She is alert.  Psychiatric:        Mood and Affect: Mood normal.     Lab Results  Component Value Date   WBC 6.8 05/23/2021   HGB 13.2 05/23/2021   HCT 40.5 05/23/2021   PLT 175.0 05/23/2021   GLUCOSE 109 (H) 04/17/2022   CHOL 114 01/14/2022   TRIG 192.0 (H) 01/14/2022   HDL 59.90 01/14/2022   LDLDIRECT 82.0 01/24/2021   LDLCALC 15 01/14/2022   ALT 16 01/14/2022   AST 21 01/14/2022   NA 139 04/17/2022   K 4.1 04/17/2022   CL 102 04/17/2022   CREATININE 0.88 04/17/2022   BUN 24 (H) 04/17/2022   CO2 29 04/17/2022   TSH 2.39 01/14/2022   INR 0.91 07/26/2018   HGBA1C 8.3 (H) 04/17/2022   MICROALBUR 0.9 05/23/2021    DG ESOPHAGUS W SINGLE CM (SOL OR THIN BA)  Result Date: 03/11/2022 CLINICAL DATA:  Sensation of solid foods becoming stuck in esophagus, particularly crunchy vegetables and steak EXAM: ESOPHAGUS/BARIUM SWALLOW/TABLET STUDY TECHNIQUE: Combined double and single contrast examination was performed using effervescent crystals, high-density barium, thin liquid barium and a barium tablet. This exam was performed by Pasty Spillers, PA-C, and was supervised and interpreted by Maurine Simmering, MD. FLUOROSCOPY: Radiation Exposure Index (as provided by the fluoroscopic device): 34.50 mGy Ref Air Kerma COMPARISON:  None Available. FINDINGS: Swallowing: Appears normal. No vestibular penetration or aspiration seen. Pharynx: Unremarkable. Esophagus: Normal appearance. Esophageal motility: Mild esophageal dysmotility. Hiatal Hernia: Small hiatal hernia. Gastroesophageal reflux: None visualized despite multiple provocative maneuvers. Ingested 62m barium tablet: Passed normally Other: Prior right proximal humerus fixation noted. IMPRESSION: Mild esophageal dysmotility.  No evidence of esophageal stricture. Small hiatal hernia. Electronically Signed   By: JMaurine SimmeringM.D.   On: 03/11/2022 09:52    Assessment & Plan:   DMioshawas seen  today for hypertension and diabetes.  Diagnoses and all orders for this visit:  HTN (hypertension), benign- Her blood pressure is adequately well controlled. -     Basic metabolic panel; Future -     Basic metabolic panel  Type II diabetes mellitus with manifestations ([place]- Her A1c is up to 8.3%.  Will add an SGLT2 inhibitor. -     Basic metabolic panel; Future -     Hemoglobin A1c; Future -     Hemoglobin A1c -     Basic metabolic panel -     Semaglutide, 2 MG/DOSE, 8 MG/3ML SOPN; Inject 2 mg as directed once a week. -     dapagliflozin propanediol (FARXIGA) 10 MG  TABS tablet; Take 1 tablet (10 mg total) by mouth daily before breakfast.  Hypercalcemia due to a drug- Her calcium is normal now. -     Basic metabolic panel; Future -     Basic metabolic panel  Primary osteoarthritis of both knees -     celecoxib (CELEBREX) 50 MG capsule; Take 1 capsule (50 mg total) by mouth 2 (two) times daily.  Chronic bilateral low back pain without sciatica -     celecoxib (CELEBREX) 50 MG capsule; Take 1 capsule (50 mg total) by mouth 2 (two) times daily.   I am having Abigail Collins start on celecoxib and dapagliflozin propanediol. I am also having her maintain her albuterol, OneTouch Verio IQ System, onetouch ultrasoft, Sure Comfort Pen Needles, metroNIDAZOLE, OneTouch Verio, atorvastatin, omeprazole, irbesartan, metFORMIN, and Semaglutide (2 MG/DOSE).  Meds ordered this encounter  Medications   Semaglutide, 2 MG/DOSE, 8 MG/3ML SOPN    Sig: Inject 2 mg as directed once a week.    Dispense:  9 mL    Refill:  1   celecoxib (CELEBREX) 50 MG capsule    Sig: Take 1 capsule (50 mg total) by mouth 2 (two) times daily.    Dispense:  180 capsule    Refill:  1   dapagliflozin propanediol (FARXIGA) 10 MG TABS tablet    Sig: Take 1 tablet (10 mg total) by mouth daily before breakfast.    Dispense:  90 tablet    Refill:  1     Follow-up: Return in about 6 months (around 10/18/2022).  Scarlette Calico, MD

## 2022-04-18 ENCOUNTER — Encounter: Payer: Self-pay | Admitting: Internal Medicine

## 2022-04-18 LAB — HEMOGLOBIN A1C: Hgb A1c MFr Bld: 8.3 % — ABNORMAL HIGH (ref 4.6–6.5)

## 2022-04-18 MED ORDER — DAPAGLIFLOZIN PROPANEDIOL 10 MG PO TABS: 10.0000 mg | ORAL_TABLET | Freq: Every day | ORAL | 1 refills | Status: DC

## 2022-04-18 MED ORDER — CELECOXIB 50 MG PO CAPS: 50.0000 mg | ORAL_CAPSULE | Freq: Two times a day (BID) | ORAL | 1 refills | Status: DC

## 2022-06-05 ENCOUNTER — Other Ambulatory Visit: Payer: Self-pay | Admitting: Internal Medicine

## 2022-06-05 DIAGNOSIS — E118 Type 2 diabetes mellitus with unspecified complications: Secondary | ICD-10-CM

## 2022-06-14 ENCOUNTER — Other Ambulatory Visit: Payer: Self-pay | Admitting: Nurse Practitioner

## 2022-06-23 ENCOUNTER — Telehealth: Payer: Self-pay

## 2022-06-23 NOTE — Telephone Encounter (Signed)
LVM FOR PT TO CONTACT OFFICE WITH MAMMOGRAM UPDATE

## 2022-07-07 ENCOUNTER — Other Ambulatory Visit: Payer: Self-pay | Admitting: Internal Medicine

## 2022-07-07 DIAGNOSIS — E785 Hyperlipidemia, unspecified: Secondary | ICD-10-CM

## 2022-07-11 ENCOUNTER — Ambulatory Visit: Payer: PPO

## 2022-08-04 ENCOUNTER — Other Ambulatory Visit: Payer: Self-pay | Admitting: Internal Medicine

## 2022-08-04 DIAGNOSIS — I10 Essential (primary) hypertension: Secondary | ICD-10-CM

## 2022-09-02 ENCOUNTER — Other Ambulatory Visit: Payer: Self-pay | Admitting: Internal Medicine

## 2022-09-02 DIAGNOSIS — E118 Type 2 diabetes mellitus with unspecified complications: Secondary | ICD-10-CM

## 2022-10-22 ENCOUNTER — Ambulatory Visit: Payer: HMO | Admitting: Internal Medicine

## 2022-10-27 ENCOUNTER — Ambulatory Visit (INDEPENDENT_AMBULATORY_CARE_PROVIDER_SITE_OTHER): Payer: Medicare PPO | Admitting: Internal Medicine

## 2022-10-27 ENCOUNTER — Encounter: Payer: Self-pay | Admitting: Internal Medicine

## 2022-10-27 VITALS — BP 138/86 | HR 100 | Temp 98.2°F | Ht 63.0 in | Wt 210.0 lb

## 2022-10-27 DIAGNOSIS — Z0001 Encounter for general adult medical examination with abnormal findings: Secondary | ICD-10-CM

## 2022-10-27 DIAGNOSIS — E119 Type 2 diabetes mellitus without complications: Secondary | ICD-10-CM | POA: Diagnosis not present

## 2022-10-27 DIAGNOSIS — Z794 Long term (current) use of insulin: Secondary | ICD-10-CM

## 2022-10-27 DIAGNOSIS — I10 Essential (primary) hypertension: Secondary | ICD-10-CM | POA: Diagnosis not present

## 2022-10-27 DIAGNOSIS — E781 Pure hyperglyceridemia: Secondary | ICD-10-CM | POA: Diagnosis not present

## 2022-10-27 DIAGNOSIS — E118 Type 2 diabetes mellitus with unspecified complications: Secondary | ICD-10-CM | POA: Diagnosis not present

## 2022-10-27 DIAGNOSIS — K2100 Gastro-esophageal reflux disease with esophagitis, without bleeding: Secondary | ICD-10-CM | POA: Diagnosis not present

## 2022-10-27 DIAGNOSIS — Z0000 Encounter for general adult medical examination without abnormal findings: Secondary | ICD-10-CM | POA: Diagnosis not present

## 2022-10-27 DIAGNOSIS — E785 Hyperlipidemia, unspecified: Secondary | ICD-10-CM | POA: Diagnosis not present

## 2022-10-27 LAB — MICROALBUMIN / CREATININE URINE RATIO
Creatinine,U: 47.8 mg/dL
Microalb Creat Ratio: 1.5 mg/g (ref 0.0–30.0)
Microalb, Ur: <0.7 mg/dL (ref 0.0–1.9)

## 2022-10-27 LAB — CBC WITH DIFFERENTIAL/PLATELET
Basophils Absolute: 0 10*3/uL (ref 0.0–0.1)
Basophils Relative: 0.6 % (ref 0.0–3.0)
Eosinophils Absolute: 0.2 10*3/uL (ref 0.0–0.7)
Eosinophils Relative: 2.9 % (ref 0.0–5.0)
HCT: 44.6 % (ref 36.0–46.0)
Hemoglobin: 14.6 g/dL (ref 12.0–15.0)
Lymphocytes Relative: 41 % (ref 12.0–46.0)
Lymphs Abs: 3.4 10*3/uL (ref 0.7–4.0)
MCHC: 32.6 g/dL (ref 30.0–36.0)
MCV: 88.4 fl (ref 78.0–100.0)
Monocytes Absolute: 0.5 10*3/uL (ref 0.1–1.0)
Monocytes Relative: 5.8 % (ref 3.0–12.0)
Neutro Abs: 4.1 10*3/uL (ref 1.4–7.7)
Neutrophils Relative %: 49.7 % (ref 43.0–77.0)
Platelets: 191 10*3/uL (ref 150.0–400.0)
RBC: 5.04 Mil/uL (ref 3.87–5.11)
RDW: 15.6 % — ABNORMAL HIGH (ref 11.5–15.5)
WBC: 8.2 10*3/uL (ref 4.0–10.5)

## 2022-10-27 LAB — LIPID PANEL
Cholesterol: 145 mg/dL (ref 0–200)
HDL: 58.3 mg/dL (ref >39.00)
NonHDL: 86.62
Total CHOL/HDL Ratio: 2
Triglycerides: 327 mg/dL — ABNORMAL HIGH (ref 0.0–149.0)
VLDL: 65.4 mg/dL — ABNORMAL HIGH (ref 0.0–40.0)

## 2022-10-27 LAB — TSH: TSH: 2.01 u[IU]/mL (ref 0.35–5.50)

## 2022-10-27 LAB — BASIC METABOLIC PANEL
BUN: 22 mg/dL (ref 6–23)
CO2: 28 mEq/L (ref 19–32)
Calcium: 9.7 mg/dL (ref 8.4–10.5)
Chloride: 103 mEq/L (ref 96–112)
Creatinine, Ser: 1.07 mg/dL (ref 0.40–1.20)
GFR: 52.57 mL/min — ABNORMAL LOW (ref >60.00)
Glucose, Bld: 204 mg/dL — ABNORMAL HIGH (ref 70–99)
Potassium: 4.9 mEq/L (ref 3.5–5.1)
Sodium: 141 mEq/L (ref 135–145)

## 2022-10-27 LAB — HEPATIC FUNCTION PANEL
ALT: 13 U/L (ref 0–35)
AST: 17 U/L (ref 0–37)
Albumin: 4.4 g/dL (ref 3.5–5.2)
Alkaline Phosphatase: 92 U/L (ref 39–117)
Bilirubin, Direct: 0.1 mg/dL (ref 0.0–0.3)
Total Bilirubin: 0.5 mg/dL (ref 0.2–1.2)
Total Protein: 7.4 g/dL (ref 6.0–8.3)

## 2022-10-27 LAB — LDL CHOLESTEROL, DIRECT: Direct LDL: 44 mg/dL

## 2022-10-27 LAB — HEMOGLOBIN A1C: Hgb A1c MFr Bld: 10 % — ABNORMAL HIGH (ref 4.6–6.5)

## 2022-10-27 MED ORDER — ONETOUCH VERIO VI STRP: ORAL_STRIP | 5 refills | Status: DC

## 2022-10-27 NOTE — Patient Instructions (Signed)

## 2022-10-27 NOTE — Progress Notes (Signed)
Subjective:  Patient ID: Abigail Collins, female    DOB: 05/31/1951  Age: 72 y.o. MRN: [id number]  CC: Annual Exam, Hypertension, Diabetes, and Hyperlipidemia   HPI Abigail Collins presents for a CPX and f/up-  She has not been using Ozempic.  She is active and denies chest pain, shortness of breath, diaphoresis, or edema.  Outpatient Medications Prior to Visit  Medication Sig Dispense Refill   albuterol (PROVENTIL) (2.5 MG/3ML) 0.083% nebulizer solution Inhale 2.5 mg into the lungs every 6 (six) hours as needed for wheezing or shortness of breath. Reported on 01/17/2016     atorvastatin (LIPITOR) 20 MG tablet TAKE 1 TABLET BY MOUTH DAILY 30 tablet 10   Blood Glucose Monitoring Suppl (ONETOUCH VERIO IQ SYSTEM) w/Device KIT 1 Act by Does not apply route 3 (three) times daily. 2 kit 1   celecoxib (CELEBREX) 50 MG capsule Take 1 capsule (50 mg total) by mouth 2 (two) times daily. 180 capsule 1   FARXIGA 10 MG TABS tablet TAKE 1 TABLET BY MOUTH EVERY DAY BEFORE BREAKFAST 30 tablet 2   irbesartan (AVAPRO) 300 MG tablet TAKE 1 TABLET BY MOUTH DAILY 30 tablet 2   Lancets (ONETOUCH ULTRASOFT) lancets 1 each by Other route 3 (three) times daily. Use as instructed 300 each 1   metroNIDAZOLE (METROGEL) 0.75 % gel Apply 1 application topically at bedtime. 45 g 1   omeprazole (PRILOSEC) 40 MG capsule TAKE 1 CAPSULE BY MOUTH DAILY 30 MINUTES BEFORE LUNCH 30 capsule 6   SURE COMFORT PEN NEEDLES 32G X 4 MM MISC USE 1 PEN NEEDLE ONCE A WEEK 100 each 10   glucose blood (ONETOUCH VERIO) test strip USE TO TEST BLOOD SUGAR 3 TIMES DAILY 100 each 10   metFORMIN (GLUCOPHAGE) 1000 MG tablet TAKE 1 TABLET BY MOUTH TWICE DAILY WITH MEALS 60 tablet 4   Semaglutide, 2 MG/DOSE, 8 MG/3ML SOPN Inject 2 mg as directed once a week. 9 mL 1   No facility-administered medications prior to visit.    ROS Review of Systems  Constitutional:  Negative for diaphoresis, fatigue and unexpected weight change.  HENT:  Negative.    Eyes: Negative.   Respiratory:  Negative for cough, chest tightness, shortness of breath and wheezing.   Cardiovascular:  Negative for chest pain, palpitations and leg swelling.  Gastrointestinal:  Negative for abdominal pain, diarrhea, nausea and vomiting.  Endocrine: Negative.   Genitourinary: Negative.  Negative for difficulty urinating.  Musculoskeletal:  Positive for back pain. Negative for arthralgias and myalgias.  Skin: Negative.   Neurological: Negative.  Negative for dizziness and weakness.  Hematological:  Negative for adenopathy. Does not bruise/bleed easily.  Psychiatric/Behavioral: Negative.      Objective:  BP 138/86 (BP Location: Right Arm, Patient Position: Sitting, Cuff Size: Large)   Pulse 100   Temp 98.2 F (36.8 C) (Oral)   Ht '5\' 3"'$  (1.6 m)   Wt 210 lb (95.3 kg)   SpO2 94%   BMI 37.20 kg/m   BP Readings from Last 3 Encounters:  10/27/22 138/86  04/18/22 130/80  01/28/22 124/76    Wt Readings from Last 3 Encounters:  10/27/22 210 lb (95.3 kg)  04/18/22 212 lb (96.2 kg)  01/28/22 211 lb (95.7 kg)    Physical Exam Vitals reviewed.  Constitutional:      Appearance: She is not ill-appearing.  HENT:     Mouth/Throat:     Mouth: Mucous membranes are moist.  Eyes:     General:  No scleral icterus.    Conjunctiva/sclera: Conjunctivae normal.  Cardiovascular:     Rate and Rhythm: Normal rate and regular rhythm.     Heart sounds: No murmur heard.    No friction rub. No gallop.     Comments: EKG- NSR, 90 bpm LAD Anterior infract pattern is old  No LVH Pulmonary:     Effort: Pulmonary effort is normal.     Breath sounds: No stridor. No wheezing, rhonchi or rales.  Abdominal:     General: Abdomen is protuberant. Bowel sounds are normal. There is no distension.     Palpations: Abdomen is soft. There is no hepatomegaly, splenomegaly or mass.     Tenderness: There is no abdominal tenderness.  Musculoskeletal:     Cervical back: Neck  supple.     Right lower leg: No edema.     Left lower leg: No edema.  Lymphadenopathy:     Cervical: No cervical adenopathy.  Skin:    General: Skin is warm and dry.  Neurological:     General: No focal deficit present.     Mental Status: She is alert.  Psychiatric:        Mood and Affect: Mood normal.        Behavior: Behavior normal.     Lab Results  Component Value Date   WBC 8.2 10/27/2022   HGB 14.6 10/27/2022   HCT 44.6 10/27/2022   PLT 191.0 10/27/2022   GLUCOSE 204 (H) 10/27/2022   CHOL 145 10/27/2022   TRIG 327.0 (H) 10/27/2022   HDL 58.30 10/27/2022   LDLDIRECT 44.0 10/27/2022   LDLCALC 15 01/14/2022   ALT 13 10/27/2022   AST 17 10/27/2022   NA 141 10/27/2022   K 4.9 10/27/2022   CL 103 10/27/2022   CREATININE 1.07 10/27/2022   BUN 22 10/27/2022   CO2 28 10/27/2022   TSH 2.01 10/27/2022   INR 0.91 07/26/2018   HGBA1C 10.0 (H) 10/27/2022   MICROALBUR <0.7 10/27/2022    DG ESOPHAGUS W SINGLE CM (SOL OR THIN BA)  Result Date: 03/11/2022 CLINICAL DATA:  Sensation of solid foods becoming stuck in esophagus, particularly crunchy vegetables and steak EXAM: ESOPHAGUS/BARIUM SWALLOW/TABLET STUDY TECHNIQUE: Combined double and single contrast examination was performed using effervescent crystals, high-density barium, thin liquid barium and a barium tablet. This exam was performed by Pasty Spillers, PA-C, and was supervised and interpreted by Maurine Simmering, MD. FLUOROSCOPY: Radiation Exposure Index (as provided by the fluoroscopic device): 34.50 mGy Ref Air Kerma COMPARISON:  None Available. FINDINGS: Swallowing: Appears normal. No vestibular penetration or aspiration seen. Pharynx: Unremarkable. Esophagus: Normal appearance. Esophageal motility: Mild esophageal dysmotility. Hiatal Hernia: Small hiatal hernia. Gastroesophageal reflux: None visualized despite multiple provocative maneuvers. Ingested 37m barium tablet: Passed normally Other: Prior right proximal humerus  fixation noted. IMPRESSION: Mild esophageal dysmotility.  No evidence of esophageal stricture. Small hiatal hernia. Electronically Signed   By: JMaurine SimmeringM.D.   On: 03/11/2022 09:52    Assessment & Plan:   DKambrywas seen today for annual exam, hypertension, diabetes and hyperlipidemia.  Diagnoses and all orders for this visit:  HTN (hypertension), benign- Her BP is adequately well controlled. -     TSH; Future -     Urinalysis, Routine w reflex microscopic; Future -     Basic metabolic panel; Future -     EKG 12-Lead -     Basic metabolic panel -     Urinalysis, Routine w reflex microscopic -  TSH  Type II diabetes mellitus with manifestations ([facility]) -     Discontinue: glucose blood (ONETOUCH VERIO) test strip; USE TO TEST BLOOD SUGAR 3 TIMES DAILY -     CBC with Differential/Platelet; Future -     Microalbumin / creatinine urine ratio; Future -     Hepatic function panel; Future -     Hemoglobin A1c; Future -     Basic metabolic panel; Future -     HM Diabetes Foot Exam -     Basic metabolic panel -     Hemoglobin A1c -     Hepatic function panel -     Microalbumin / creatinine urine ratio -     CBC with Differential/Platelet -     metFORMIN (GLUCOPHAGE) 1000 MG tablet; Take 1 tablet (1,000 mg total) by mouth 2 (two) times daily with a meal.  Gastroesophageal reflux disease with esophagitis without hemorrhage -     CBC with Differential/Platelet; Future -     CBC with Differential/Platelet  Pure hyperglyceridemia -     Lipid panel; Future -     Lipid panel -     Amb Referral to Nutrition and Diabetic Education  Hyperlipidemia with target LDL less than 100- LDL goal achieved. Doing well on the statin  -     Lipid panel; Future -     TSH; Future -     TSH -     Lipid panel  Encounter for general adult medical examination with abnormal findings- Exam completed, labs reviewed, vaccines reviewed, she refuses to do a mammogram/other cancer screenings are up-to-date,  patient education was given.  Insulin-requiring or dependent type II diabetes mellitus ([place])- Her A1c is up to 10%.  I recommended that she add a basal insulin and GLP-1 agonist to metformin. -     Insulin Glargine-Lixisenatide (SOLIQUA) 100-33 UNT-MCG/ML SOPN; Inject 20 Units into the skin daily. -     Continuous Blood Gluc Sensor (DEXCOM G7 SENSOR) MISC; 1 Act by Does not apply route daily. -     Continuous Blood Gluc Receiver ([place]) [place]; 1 Act by Does not apply route daily. -     Amb Referral to Nutrition and Diabetic Education -     metFORMIN (GLUCOPHAGE) 1000 MG tablet; Take 1 tablet (1,000 mg total) by mouth 2 (two) times daily with a meal. -     Ambulatory referral to Ophthalmology  Other orders -     LDL cholesterol, direct   I have discontinued Neoma Laming D. Salvatierra's OneTouch Verio and Semaglutide (2 MG/DOSE). I have also changed her metFORMIN. Additionally, I am having her start on Sol[place]a, [place], and [employer]. Lastly, I am having her maintain her albuterol, OneTouch Verio IQ System, onetouch ultrasoft, Sure Comfort Pen Needles, metroNIDAZOLE, celecoxib, omeprazole, atorvastatin, irbesartan, and Farxiga.  Meds ordered this encounter  Medications   DISCONTD: glucose blood (ONETOUCH VERIO) test strip    Sig: USE TO TEST BLOOD SUGAR 3 TIMES DAILY    Dispense:  100 each    Refill:  5   Insulin Glargine-Lixisenatide (SOLIQUA) 100-33 UNT-MCG/ML SOPN    Sig: Inject 20 Units into the skin daily.    Dispense:  18 mL    Refill:  0   Continuous Blood Gluc Sensor (DEXCOM G7 SENSOR) MISC    Sig: 1 Act by Does not apply route daily.    Dispense:  9 each    Refill:  1   Continuous Blood Gluc  Receiver ([place]) DEVI    Sig: 1 Act by Does not apply route daily.    Dispense:  9 each    Refill:  1   metFORMIN (GLUCOPHAGE) 1000 MG tablet    Sig: Take 1 tablet (1,000 mg total) by mouth 2 (two) times daily with a meal.    Dispense:  180 tablet     Refill:  0     Follow-up: Return in about 6 months (around 04/27/2023).  Scarlette Calico, MD

## 2022-10-28 LAB — URINALYSIS, ROUTINE W REFLEX MICROSCOPIC
Bilirubin Urine: NEGATIVE
Hgb urine dipstick: NEGATIVE
Ketones, ur: NEGATIVE
Leukocytes,Ua: NEGATIVE
Nitrite: NEGATIVE
RBC / HPF: NONE SEEN (ref 0–?)
Specific Gravity, Urine: 1.015 (ref 1.000–1.030)
Total Protein, Urine: NEGATIVE
Urine Glucose: >=1000 — AB
Urobilinogen, UA: 0.2 (ref 0.0–1.0)
pH: 5.5 (ref 5.0–8.0)

## 2022-10-28 MED ORDER — DEXCOM G7 RECEIVER DEVI: 1.0000 | Freq: Every day | 1 refills | Status: DC

## 2022-10-28 MED ORDER — DEXCOM G7 SENSOR MISC: 1.0000 | Freq: Every day | 1 refills | Status: DC

## 2022-10-28 MED ORDER — SOLIQUA 100-33 UNT-MCG/ML ~~LOC~~ SOPN: 20.0000 [IU] | PEN_INJECTOR | Freq: Every day | SUBCUTANEOUS | 0 refills | Status: DC

## 2022-10-28 MED ORDER — METFORMIN HCL 1000 MG PO TABS: 1000.0000 mg | ORAL_TABLET | Freq: Two times a day (BID) | ORAL | 0 refills | Status: DC

## 2022-10-30 ENCOUNTER — Telehealth: Payer: Self-pay

## 2022-10-30 NOTE — Telephone Encounter (Signed)
Approved Coverage Ends on: 10/13/2023 

## 2022-10-30 NOTE — Telephone Encounter (Signed)
Key: [id number]

## 2022-10-31 ENCOUNTER — Other Ambulatory Visit: Payer: Self-pay | Admitting: Internal Medicine

## 2022-10-31 DIAGNOSIS — E118 Type 2 diabetes mellitus with unspecified complications: Secondary | ICD-10-CM

## 2022-11-06 ENCOUNTER — Telehealth: Payer: Self-pay | Admitting: Internal Medicine

## 2022-11-06 ENCOUNTER — Other Ambulatory Visit: Payer: Self-pay | Admitting: Internal Medicine

## 2022-11-06 DIAGNOSIS — E119 Type 2 diabetes mellitus without complications: Secondary | ICD-10-CM | POA: Insufficient documentation

## 2022-11-06 MED ORDER — TIRZEPATIDE 2.5 MG/0.5ML ~~LOC~~ SOAJ: 2.5000 mg | SUBCUTANEOUS | 0 refills | Status: DC

## 2022-11-06 MED ORDER — TOUJEO MAX SOLOSTAR 300 UNIT/ML ~~LOC~~ SOPN: 30.0000 [IU] | PEN_INJECTOR | Freq: Every day | SUBCUTANEOUS | 1 refills | Status: DC

## 2022-11-06 NOTE — Telephone Encounter (Signed)
Patient called wanting to know if Dr. Ronnald Ramp could call in a Tier 1 or Tier 2 medication for insulin because the medication Insulin Glargine-Lixisenatide (SOLIQUA) 100-33 UNT-MCG/ML SOPN is too expensive for her to pay for every month. Best Call back number for patient is [phone].

## 2022-11-06 NOTE — Progress Notes (Unsigned)
Subjective:  Patient ID: Abigail Collins, female    DOB: Jul 08, 1951  Age: 72 y.o. MRN: [id number]  CC: No chief complaint on file.   HPI Abigail Collins presents for ***  Outpatient Medications Prior to Visit  Medication Sig Dispense Refill   albuterol (PROVENTIL) (2.5 MG/3ML) 0.083% nebulizer solution Inhale 2.5 mg into the lungs every 6 (six) hours as needed for wheezing or shortness of breath. Reported on 01/17/2016     atorvastatin (LIPITOR) 20 MG tablet TAKE 1 TABLET BY MOUTH DAILY 30 tablet 10   Blood Glucose Monitoring Suppl (ONETOUCH VERIO IQ SYSTEM) w/Device KIT 1 Act by Does not apply route 3 (three) times daily. 2 kit 1   celecoxib (CELEBREX) 50 MG capsule Take 1 capsule (50 mg total) by mouth 2 (two) times daily. 180 capsule 1   Continuous Blood Gluc Receiver (DEXCOM G7 RECEIVER) DEVI 1 Act by Does not apply route daily. 9 each 1   Continuous Blood Gluc Sensor (DEXCOM G7 SENSOR) MISC 1 Act by Does not apply route daily. 9 each 1   FARXIGA 10 MG TABS tablet TAKE 1 TABLET BY MOUTH EVERY DAY BEFORE BREAKFAST 30 tablet 2   glucose blood (ONETOUCH VERIO) test strip USE TO TEST THREE TIMES DAILY 200 strip 2   irbesartan (AVAPRO) 300 MG tablet TAKE 1 TABLET BY MOUTH DAILY 30 tablet 2   Lancets (ONETOUCH ULTRASOFT) lancets 1 each by Other route 3 (three) times daily. Use as instructed 300 each 1   metFORMIN (GLUCOPHAGE) 1000 MG tablet Take 1 tablet (1,000 mg total) by mouth 2 (two) times daily with a meal. 180 tablet 0   metroNIDAZOLE (METROGEL) 0.75 % gel Apply 1 application topically at bedtime. 45 g 1   omeprazole (PRILOSEC) 40 MG capsule TAKE 1 CAPSULE BY MOUTH DAILY 30 MINUTES BEFORE LUNCH 30 capsule 6   SURE COMFORT PEN NEEDLES 32G X 4 MM MISC USE 1 PEN NEEDLE ONCE A WEEK 100 each 10   Insulin Glargine-Lixisenatide (SOLIQUA) 100-33 UNT-MCG/ML SOPN Inject 20 Units into the skin daily. 18 mL 0   No facility-administered medications prior to visit.    ROS Review of  Systems  Objective:  There were no vitals taken for this visit.  BP Readings from Last 3 Encounters:  10/27/22 138/86  04/18/22 130/80  01/28/22 124/76    Wt Readings from Last 3 Encounters:  10/27/22 210 lb (95.3 kg)  04/18/22 212 lb (96.2 kg)  01/28/22 211 lb (95.7 kg)    Physical Exam  Lab Results  Component Value Date   WBC 8.2 10/27/2022   HGB 14.6 10/27/2022   HCT 44.6 10/27/2022   PLT 191.0 10/27/2022   GLUCOSE 204 (H) 10/27/2022   CHOL 145 10/27/2022   TRIG 327.0 (H) 10/27/2022   HDL 58.30 10/27/2022   LDLDIRECT 44.0 10/27/2022   LDLCALC 15 01/14/2022   ALT 13 10/27/2022   AST 17 10/27/2022   NA 141 10/27/2022   K 4.9 10/27/2022   CL 103 10/27/2022   CREATININE 1.07 10/27/2022   BUN 22 10/27/2022   CO2 28 10/27/2022   TSH 2.01 10/27/2022   INR 0.91 07/26/2018   HGBA1C 10.0 (H) 10/27/2022   MICROALBUR <0.7 10/27/2022    DG ESOPHAGUS W SINGLE CM (SOL OR THIN BA)  Result Date: 03/11/2022 CLINICAL DATA:  Sensation of solid foods becoming stuck in esophagus, particularly crunchy vegetables and steak EXAM: ESOPHAGUS/BARIUM SWALLOW/TABLET STUDY TECHNIQUE: Combined double and single contrast examination was performed using effervescent  crystals, high-density barium, thin liquid barium and a barium tablet. This exam was performed by Pasty Spillers, PA-C, and was supervised and interpreted by Maurine Simmering, MD. FLUOROSCOPY: Radiation Exposure Index (as provided by the fluoroscopic device): 34.50 mGy Ref Air Kerma COMPARISON:  None Available. FINDINGS: Swallowing: Appears normal. No vestibular penetration or aspiration seen. Pharynx: Unremarkable. Esophagus: Normal appearance. Esophageal motility: Mild esophageal dysmotility. Hiatal Hernia: Small hiatal hernia. Gastroesophageal reflux: None visualized despite multiple provocative maneuvers. Ingested 73m barium tablet: Passed normally Other: Prior right proximal humerus fixation noted. IMPRESSION: Mild esophageal  dysmotility.  No evidence of esophageal stricture. Small hiatal hernia. Electronically Signed   By: JMaurine SimmeringM.D.   On: 03/11/2022 09:52    Assessment & Plan:   Diagnoses and all orders for this visit:  Insulin-requiring or dependent type II diabetes mellitus (HMarvin   I have discontinued DNeoma LamingD. Petrich's SWilleen Niece I am also having her maintain her albuterol, OneTouch Verio IQ System, onetouch ultrasoft, Sure Comfort Pen Needles, metroNIDAZOLE, celecoxib, omeprazole, atorvastatin, irbesartan, Farxiga, Dexcom G7 Sensor, [employer] metFORMIN, and [employer]  No orders of the defined types were placed in this encounter.    Follow-up: No follow-ups on file.  TScarlette Calico MD

## 2022-11-21 ENCOUNTER — Other Ambulatory Visit: Payer: Self-pay

## 2022-11-21 ENCOUNTER — Observation Stay (HOSPITAL_COMMUNITY)
Admission: EM | Admit: 2022-11-21 | Discharge: 2022-11-22 | Disposition: A | Discharge status: Home / Self Care | Payer: Medicare PPO | Attending: Internal Medicine | Admitting: Internal Medicine

## 2022-11-21 ENCOUNTER — Encounter (HOSPITAL_COMMUNITY): Payer: Self-pay

## 2022-11-21 DIAGNOSIS — Z87891 Personal history of nicotine dependence: Secondary | ICD-10-CM | POA: Diagnosis not present

## 2022-11-21 DIAGNOSIS — I10 Essential (primary) hypertension: Secondary | ICD-10-CM | POA: Diagnosis not present

## 2022-11-21 DIAGNOSIS — J4520 Mild intermittent asthma, uncomplicated: Secondary | ICD-10-CM

## 2022-11-21 DIAGNOSIS — E669 Obesity, unspecified: Secondary | ICD-10-CM | POA: Insufficient documentation

## 2022-11-21 DIAGNOSIS — Z79899 Other long term (current) drug therapy: Secondary | ICD-10-CM | POA: Diagnosis not present

## 2022-11-21 DIAGNOSIS — R739 Hyperglycemia, unspecified: Secondary | ICD-10-CM | POA: Diagnosis present

## 2022-11-21 DIAGNOSIS — Z96653 Presence of artificial knee joint, bilateral: Secondary | ICD-10-CM | POA: Diagnosis not present

## 2022-11-21 DIAGNOSIS — E118 Type 2 diabetes mellitus with unspecified complications: Secondary | ICD-10-CM

## 2022-11-21 DIAGNOSIS — Z7984 Long term (current) use of oral hypoglycemic drugs: Secondary | ICD-10-CM | POA: Diagnosis not present

## 2022-11-21 DIAGNOSIS — E7849 Other hyperlipidemia: Secondary | ICD-10-CM | POA: Diagnosis not present

## 2022-11-21 DIAGNOSIS — E119 Type 2 diabetes mellitus without complications: Secondary | ICD-10-CM

## 2022-11-21 DIAGNOSIS — Z794 Long term (current) use of insulin: Secondary | ICD-10-CM | POA: Diagnosis not present

## 2022-11-21 DIAGNOSIS — E785 Hyperlipidemia, unspecified: Secondary | ICD-10-CM | POA: Diagnosis present

## 2022-11-21 DIAGNOSIS — E1165 Type 2 diabetes mellitus with hyperglycemia: Principal | ICD-10-CM | POA: Insufficient documentation

## 2022-11-21 DIAGNOSIS — G8929 Other chronic pain: Secondary | ICD-10-CM

## 2022-11-21 DIAGNOSIS — R000 Tachycardia, unspecified: Secondary | ICD-10-CM | POA: Diagnosis not present

## 2022-11-21 DIAGNOSIS — J45909 Unspecified asthma, uncomplicated: Secondary | ICD-10-CM | POA: Diagnosis not present

## 2022-11-21 DIAGNOSIS — J309 Allergic rhinitis, unspecified: Secondary | ICD-10-CM | POA: Diagnosis present

## 2022-11-21 DIAGNOSIS — M170 Bilateral primary osteoarthritis of knee: Secondary | ICD-10-CM

## 2022-11-21 LAB — URINALYSIS, ROUTINE W REFLEX MICROSCOPIC
Bacteria, UA: NONE SEEN
Bilirubin Urine: NEGATIVE
Glucose, UA: >=500 mg/dL — AB
Hgb urine dipstick: NEGATIVE
Ketones, ur: NEGATIVE mg/dL
Leukocytes,Ua: NEGATIVE
Nitrite: NEGATIVE
Protein, ur: NEGATIVE mg/dL
Specific Gravity, Urine: 1.022 (ref 1.005–1.030)
pH: 5 (ref 5.0–8.0)

## 2022-11-21 LAB — CBC WITH DIFFERENTIAL/PLATELET
Abs Immature Granulocytes: 0.02 10*3/uL (ref 0.00–0.07)
Basophils Absolute: 0 10*3/uL (ref 0.0–0.1)
Basophils Relative: 1 %
Eosinophils Absolute: 0.2 10*3/uL (ref 0.0–0.5)
Eosinophils Relative: 2 %
HCT: 45.8 % (ref 36.0–46.0)
Hemoglobin: 14.4 g/dL (ref 12.0–15.0)
Immature Granulocytes: 0 %
Lymphocytes Relative: 36 %
Lymphs Abs: 3.1 10*3/uL (ref 0.7–4.0)
MCH: 29.2 pg (ref 26.0–34.0)
MCHC: 31.4 g/dL (ref 30.0–36.0)
MCV: 92.9 fL (ref 80.0–100.0)
Monocytes Absolute: 0.5 10*3/uL (ref 0.1–1.0)
Monocytes Relative: 6 %
Neutro Abs: 4.8 10*3/uL (ref 1.7–7.7)
Neutrophils Relative %: 55 %
Platelets: 183 10*3/uL (ref 150–400)
RBC: 4.93 MIL/uL (ref 3.87–5.11)
RDW: 14.5 % (ref 11.5–15.5)
WBC: 8.6 10*3/uL (ref 4.0–10.5)
nRBC: 0 % (ref 0.0–0.2)

## 2022-11-21 LAB — GLUCOSE, CAPILLARY: Glucose-Capillary: 140 mg/dL — ABNORMAL HIGH (ref 70–99)

## 2022-11-21 LAB — BASIC METABOLIC PANEL
Anion gap: 13 (ref 5–15)
BUN: 25 mg/dL — ABNORMAL HIGH (ref 8–23)
CO2: 20 mmol/L — ABNORMAL LOW (ref 22–32)
Calcium: 9 mg/dL (ref 8.9–10.3)
Chloride: 105 mmol/L (ref 98–111)
Creatinine, Ser: 1 mg/dL (ref 0.44–1.00)
GFR, Estimated: >60 mL/min (ref >60)
Glucose, Bld: 199 mg/dL — ABNORMAL HIGH (ref 70–99)
Potassium: 4.1 mmol/L (ref 3.5–5.1)
Sodium: 138 mmol/L (ref 135–145)

## 2022-11-21 LAB — I-STAT VENOUS BLOOD GAS, ED
Acid-base deficit: 4 mmol/L — ABNORMAL HIGH (ref 0.0–2.0)
Bicarbonate: 22.6 mmol/L (ref 20.0–28.0)
Calcium, Ion: 1.25 mmol/L (ref 1.15–1.40)
HCT: 41 % (ref 36.0–46.0)
Hemoglobin: 13.9 g/dL (ref 12.0–15.0)
O2 Saturation: 73 %
Potassium: 3.9 mmol/L (ref 3.5–5.1)
Sodium: 138 mmol/L (ref 135–145)
TCO2: 24 mmol/L (ref 22–32)
pCO2, Ven: 48.2 mmHg (ref 44–60)
pH, Ven: 7.279 (ref 7.25–7.43)
pO2, Ven: 44 mmHg (ref 32–45)

## 2022-11-21 LAB — COMPREHENSIVE METABOLIC PANEL
ALT: 18 U/L (ref 0–44)
AST: 21 U/L (ref 15–41)
Albumin: 4 g/dL (ref 3.5–5.0)
Alkaline Phosphatase: 82 U/L (ref 38–126)
Anion gap: 15 (ref 5–15)
BUN: 26 mg/dL — ABNORMAL HIGH (ref 8–23)
CO2: 18 mmol/L — ABNORMAL LOW (ref 22–32)
Calcium: 9.8 mg/dL (ref 8.9–10.3)
Chloride: 103 mmol/L (ref 98–111)
Creatinine, Ser: 1.09 mg/dL — ABNORMAL HIGH (ref 0.44–1.00)
GFR, Estimated: 55 mL/min — ABNORMAL LOW (ref >60)
Glucose, Bld: 387 mg/dL — ABNORMAL HIGH (ref 70–99)
Potassium: 4.1 mmol/L (ref 3.5–5.1)
Sodium: 136 mmol/L (ref 135–145)
Total Bilirubin: 1.1 mg/dL (ref 0.3–1.2)
Total Protein: 7.1 g/dL (ref 6.5–8.1)

## 2022-11-21 LAB — BETA-HYDROXYBUTYRIC ACID: Beta-Hydroxybutyric Acid: 0.17 mmol/L (ref 0.05–0.27)

## 2022-11-21 LAB — CBC
HCT: 42.7 % (ref 36.0–46.0)
Hemoglobin: 12.9 g/dL (ref 12.0–15.0)
MCH: 29.3 pg (ref 26.0–34.0)
MCHC: 30.2 g/dL (ref 30.0–36.0)
MCV: 97 fL (ref 80.0–100.0)
Platelets: 156 10*3/uL (ref 150–400)
RBC: 4.4 MIL/uL (ref 3.87–5.11)
RDW: 14.4 % (ref 11.5–15.5)
WBC: 7.2 10*3/uL (ref 4.0–10.5)
nRBC: 0 % (ref 0.0–0.2)

## 2022-11-21 LAB — HIV ANTIBODY (ROUTINE TESTING W REFLEX): HIV Screen 4th Generation wRfx: NONREACTIVE

## 2022-11-21 LAB — CBG MONITORING, ED
Glucose-Capillary: 336 mg/dL — ABNORMAL HIGH (ref 70–99)
Glucose-Capillary: 206 mg/dL — ABNORMAL HIGH (ref 70–99)
Glucose-Capillary: 149 mg/dL — ABNORMAL HIGH (ref 70–99)

## 2022-11-21 LAB — MAGNESIUM: Magnesium: 2.1 mg/dL (ref 1.7–2.4)

## 2022-11-21 MED ORDER — METFORMIN HCL 500 MG PO TABS
1000.0000 mg | ORAL_TABLET | Freq: Two times a day (BID) | ORAL | Status: DC
  Administered 2022-11-22: 1000 mg via ORAL
  Filled 2022-11-21: qty 2

## 2022-11-21 MED ORDER — SODIUM CHLORIDE 0.9 % IV BOLUS
500.0000 mL | Freq: Once | INTRAVENOUS | Status: AC
  Administered 2022-11-21: 500 mL via INTRAVENOUS

## 2022-11-21 MED ORDER — IRBESARTAN 300 MG PO TABS
300.0000 mg | ORAL_TABLET | Freq: Every day | ORAL | Status: DC
  Administered 2022-11-22: 300 mg via ORAL
  Filled 2022-11-21: qty 1

## 2022-11-21 MED ORDER — ENOXAPARIN SODIUM 40 MG/0.4ML IJ SOSY
40.0000 mg | PREFILLED_SYRINGE | INTRAMUSCULAR | Status: DC
  Administered 2022-11-21: 40 mg via SUBCUTANEOUS
  Filled 2022-11-21: qty 0.4

## 2022-11-21 MED ORDER — INSULIN REGULAR(HUMAN) IN NACL 100-0.9 UT/100ML-% IV SOLN: INTRAVENOUS | Status: DC

## 2022-11-21 MED ORDER — ALBUTEROL SULFATE (2.5 MG/3ML) 0.083% IN NEBU: 2.5000 mg | INHALATION_SOLUTION | Freq: Four times a day (QID) | RESPIRATORY_TRACT | Status: DC | PRN

## 2022-11-21 MED ORDER — DEXTROSE 50 % IV SOLN: 0.0000 mL | INTRAVENOUS | Status: DC | PRN

## 2022-11-21 MED ORDER — INSULIN GLARGINE-YFGN 100 UNIT/ML ~~LOC~~ SOLN
15.0000 [IU] | SUBCUTANEOUS | Status: DC
  Administered 2022-11-21: 15 [IU] via SUBCUTANEOUS
  Filled 2022-11-21 (×4): qty 0.15

## 2022-11-21 MED ORDER — ATORVASTATIN CALCIUM 10 MG PO TABS
20.0000 mg | ORAL_TABLET | Freq: Every day | ORAL | Status: DC
  Administered 2022-11-22: 20 mg via ORAL
  Filled 2022-11-21: qty 2

## 2022-11-21 MED ORDER — CELECOXIB 100 MG PO CAPS
100.0000 mg | ORAL_CAPSULE | Freq: Every day | ORAL | Status: DC
  Filled 2022-11-21 (×2): qty 1

## 2022-11-21 MED ORDER — INSULIN ASPART 100 UNIT/ML IJ SOLN
0.0000 [IU] | Freq: Three times a day (TID) | INTRAMUSCULAR | Status: DC
  Administered 2022-11-22 (×2): 3 [IU] via SUBCUTANEOUS

## 2022-11-21 MED ORDER — INSULIN ASPART 100 UNIT/ML IJ SOLN: 0.0000 [IU] | Freq: Every day | INTRAMUSCULAR | Status: DC

## 2022-11-21 MED ORDER — DEXTROSE IN LACTATED RINGERS 5 % IV SOLN: INTRAVENOUS | Status: DC

## 2022-11-21 MED ORDER — LACTATED RINGERS IV SOLN: INTRAVENOUS | Status: DC

## 2022-11-21 MED ORDER — PANTOPRAZOLE SODIUM 40 MG PO TBEC
40.0000 mg | DELAYED_RELEASE_TABLET | Freq: Every day | ORAL | Status: DC
  Administered 2022-11-22: 40 mg via ORAL
  Filled 2022-11-21: qty 1

## 2022-11-21 MED ORDER — CELECOXIB 50 MG PO CAPS: 50.0000 mg | ORAL_CAPSULE | Freq: Two times a day (BID) | ORAL | Status: DC

## 2022-11-21 NOTE — ED Provider Notes (Signed)
[facility] Provider Note   CSN: [id number] Arrival date & time: 11/21/22  1249     History  Chief Complaint  Patient presents with   Hyperglycemia    Abigail Collins is a 72 y.o. female.  Patient with history of diabetes, hypertension, hyperlipidemia presents today with complaints of hyperglycemia.  She states that she has been struggling with her blood sugars for the past month since her insurance stopped covering her Ozempic. She states that her pcp saw and evaluated her after this and called in insulin for her to start taking, however she was unable to afford the copay for this medication. She states she called her doctor and told them this on 1/25 and she has not heard back from them yet. States that today she checked her sugar and it read "high." She then took her husbands lantus and came here for evaluation. She denies any symptoms including fevers, chills, chest pain, shortness of breath, nausea, vomiting, diarrhea, or abdominal pain.   The history is provided by the patient. No language interpreter was used.  Hyperglycemia      Home Medications Prior to Admission medications   Medication Sig Start Date End Date Taking? Authorizing Provider  albuterol (PROVENTIL) (2.5 MG/3ML) 0.083% nebulizer solution Inhale 2.5 mg into the lungs every 6 (six) hours as needed for wheezing or shortness of breath. Reported on 01/17/2016    [provider]  atorvastatin (LIPITOR) 20 MG tablet TAKE 1 TABLET BY MOUTH DAILY 07/07/22   Janith Lima, MD  Blood Glucose Monitoring Suppl (ONETOUCH VERIO IQ SYSTEM) w/Device KIT 1 Act by Does not apply route 3 (three) times daily. 10/16/21   Janith Lima, MD  celecoxib (CELEBREX) 50 MG capsule Take 1 capsule (50 mg total) by mouth 2 (two) times daily. 04/18/22   Janith Lima, MD  Continuous Blood Gluc Receiver ([place]) [place] 1 Act by Does not apply route daily. 10/28/22   Janith Lima,  MD  Continuous Blood Gluc Sensor (DEXCOM G7 SENSOR) MISC 1 Act by Does not apply route daily. 10/28/22   Janith Lima, MD  FARXIGA 10 MG TABS tablet TAKE 1 TABLET BY MOUTH EVERY DAY BEFORE BREAKFAST 09/03/22   Janith Lima, MD  glucose blood [facility] VERIO) test strip USE TO TEST THREE TIMES DAILY 10/31/22   Janith Lima, MD  insulin glargine, 2 Unit Dial, (TOUJEO MAX SOLOSTAR) 300 UNIT/ML Solostar Pen Inject 30 Units into the skin daily. 11/06/22   Janith Lima, MD  irbesartan (AVAPRO) 300 MG tablet TAKE 1 TABLET BY MOUTH DAILY 08/04/22   Janith Lima, MD  Lancets [facility] ULTRASOFT) lancets 1 each by Other route 3 (three) times daily. Use as instructed 10/16/21   Janith Lima, MD  metFORMIN (GLUCOPHAGE) 1000 MG tablet Take 1 tablet (1,000 mg total) by mouth 2 (two) times daily with a meal. 10/28/22   Janith Lima, MD  omeprazole (PRILOSEC) 40 MG capsule TAKE 1 CAPSULE BY MOUTH DAILY 30 MINUTES BEFORE LUNCH 06/17/22   Willia Craze, NP  SURE COMFORT PEN NEEDLES 32G X 4 MM MISC USE 1 PEN NEEDLE ONCE A WEEK 11/11/21   Janith Lima, MD  tirzepatide [facility]) 2.5 MG/0.5ML Pen Inject 2.5 mg into the skin once a week. 11/06/22   Janith Lima, MD      Allergies    Lisinopril, Amlodipine, Tape, Morphine and related, and Percocet [oxycodone-acetaminophen]  Review of Systems   Review of Systems  All other systems reviewed and are negative.   Physical Exam Updated Vital Signs BP 106/67   Pulse 89   Temp (!) 97.5 F (36.4 C) (Oral)   Resp 18   Ht 5' 3"$  (1.6 m)   Wt 95.3 kg   SpO2 92%   BMI 37.20 kg/m  Physical Exam Vitals and nursing note reviewed.  Constitutional:      General: She is not in acute distress.    Appearance: Normal appearance. She is normal weight. She is not ill-appearing, toxic-appearing or diaphoretic.  HENT:     Head: Normocephalic and atraumatic.  Cardiovascular:     Rate and Rhythm: Normal rate and regular rhythm.     Heart sounds: Normal  heart sounds.  Pulmonary:     Effort: Pulmonary effort is normal. No respiratory distress.     Breath sounds: Normal breath sounds.  Abdominal:     General: Abdomen is flat.     Palpations: Abdomen is soft.     Tenderness: There is no abdominal tenderness.  Musculoskeletal:        General: Normal range of motion.     Cervical back: Normal range of motion.  Skin:    General: Skin is warm and dry.  Neurological:     General: No focal deficit present.     Mental Status: She is alert.  Psychiatric:        Mood and Affect: Mood normal.        Behavior: Behavior normal.     ED Results / Procedures / Treatments   Labs (all labs ordered are listed, but only abnormal results are displayed) Labs Reviewed  COMPREHENSIVE METABOLIC PANEL - Abnormal; Notable for the following components:      Result Value   CO2 18 (*)    Glucose, Bld 387 (*)    BUN 26 (*)    Creatinine, Ser 1.09 (*)    GFR, Estimated 55 (*)    All other components within normal limits  CBG MONITORING, ED - Abnormal; Notable for the following components:   Glucose-Capillary 336 (*)    All other components within normal limits  I-STAT VENOUS BLOOD GAS, ED - Abnormal; Notable for the following components:   Acid-base deficit 4.0 (*)    All other components within normal limits  CBG MONITORING, ED - Abnormal; Notable for the following components:   Glucose-Capillary 206 (*)    All other components within normal limits  CBC WITH DIFFERENTIAL/PLATELET  MAGNESIUM  BETA-HYDROXYBUTYRIC ACID  URINALYSIS, ROUTINE W REFLEX MICROSCOPIC  BASIC METABOLIC PANEL    EKG EKG Interpretation  Date/Time:  Friday November 21 2022 13:16:31 EST Ventricular Rate:  101 PR Interval:  160 QRS Duration: 96 QT Interval:  352 QTC Calculation: 456 R Axis:   -49 Text Interpretation: Sinus tachycardia Left anterior fascicular block Moderate voltage criteria for LVH, may be normal variant ( R in aVL , Cornell product ) Cannot rule out  Inferior infarct (masked by fascicular block?) , age undetermined Anterolateral infarct , age undetermined Confirmed by Sherwood Gambler [phone]) on 11/21/2022 2:36:16 PM  Radiology No results found.  Procedures Procedures    Medications Ordered in ED Medications  sodium chloride 0.9 % bolus 500 mL (0 mLs Intravenous Stopped 11/21/22 1507)  sodium chloride 0.9 % bolus 500 mL (500 mLs Intravenous New Bag/Given 11/21/22 1510)    ED Course/ Medical Decision Making/ A&P Clinical Course as of 11/21/22 1713  Fri  Nov 21, 2022  1653 pH, Ven: 7.279 [VB]    Clinical Course User Index [VB] Elgie Congo, MD                             Medical Decision Making  This patient is a 72 y.o. female who presents to the ED for concern of hyperglycemia, this involves an extensive number of treatment options, and is a complaint that carries with it a high risk of complications and morbidity. The emergent differential diagnosis prior to evaluation includes, but is not limited to,  DKA/HHS, hyperglycemia, sepsis. This is not an exhaustive differential.   Past Medical History / Co-morbidities / Social History: Hx T2DM on metformin, having difficulty affording her insulin  Physical Exam: Physical exam performed. The pertinent findings include: No acute physical exam findings  Lab Tests: I ordered, and personally interpreted labs.  The pertinent results include:  CBG 336 --> 206, bicarb 18, glucose 387, BUN 26, creatinine 1.09, anion gap 15. pH 7.27   Medications: I ordered medication including fluids  for hyperglycemia. Reevaluation of the patient after these medicines showed that the patient improved. I have reviewed the patients home medicines and have made adjustments as needed.   Disposition:  Patient presents today with complaints of hyperglycemia. She is afebrile, non-toxic appearing, and in no acute distress with reassuring vital signs. Upon discussion with the patient as well as my attending  Dr. Regenia Skeeter, some concern that labs are borderline for DKA. Patient does state that her CBG read "high" at home. She has had difficulty affording her insulin and has only been on metformin. Given this, I am concerned that she needs a better plan in place prior to discharge to prevent bounceback. Patient is understanding and in agreement.    This is a shared visit with supervising physician Dr. Regenia Skeeter who has independently evaluated patient & provided guidance in evaluation/management/disposition, in agreement with care    Final Clinical Impression(s) / ED Diagnoses Final diagnoses:  Hyperglycemia    Rx / DC Orders ED Discharge Orders     None         Nestor Lewandowsky 11/21/22 1806    Sherwood Gambler, MD 11/23/22 [phone]

## 2022-11-21 NOTE — ED Triage Notes (Addendum)
Reports took her blood sugar this morning and it was 497 and complains of blurry vision and feeling as though  she may pass out.  Denies any other symptoms or sickness. Reports she took metformin and 50 units of lantus this mornign.

## 2022-11-21 NOTE — H&P (Addendum)
History and Physical    DOA: 11/21/2022  PCP: Janith Lima, MD  Patient coming from: home  Chief Complaint: Elevated blood glucose levels  HPI: Abigail Collins is a 72 y.o. female with history h/o longstanding diabetes mellitus for at least 25 years presents with uncontrolled diabetes and insurance limiting her ability to obtain recently prescribed medications.  Patient states that her diabetes was managed with metformin only for many years-subsequently added on Lantus for couple of years.  Sometime around 2019 patient was initiated on Ozempic injections once a week which helped her lose about 60 pounds of weight.  She states sometime during this course Lantus was discontinued as her blood sugars were doing well with weight loss.  She states sadly she had to come off this medication as she entered the donut hole for insurance payment and could not afford it.  She had been off this medication since March 2022.  She kept taking metformin 1000 mg twice daily until a recently (January 15) when she saw her primary care doctor and new medications-Soliqua and oral hypoglycemics prescribed.  She states her insurance would not cover this medication and she tried to call her PCP back to prescribe a Tier 1 alternative but could not reach them.  Over the last couple of days she had began to experience blurry vision polyuria and dizziness with her blood glucose ranging 200s to 300s.  This morning she ate a cheese sandwich and drank hazelnut coffee after which her blood glucose level was 484.  She took metformin and 1 hour later her blood glucose was even worse in the 500s which prompted ED visit. ED course: Afebrile, WBC 8.6, hemoglobin 14.4, platelet 183, BUN 26, creatinine 1.09, BG 387, bicarb 18, anion gap 15.  Beta hydroxybutyrate acid WNL at 0.17.  Patient received IV fluid bolus and repeat glucose level came down to 200s.  ED physician requested admission in concern for recurrent hyperglycemia and risk of  DKA if discharged without a contingency plan.   Review of Systems: As per HPI, otherwise review of systems negative.    Past Medical History:  Diagnosis Date   Anemia    hx of   Anginal pain ([place])    "left arm pain, sees Dr. Etter Sjogren, had card cath 2013"   Arthritis    "all over" (09/14/2012)   Asthma    takes inhaler 2x day   Bronchitis    hx of   Bulging disc    "lower"   Carpal tunnel syndrome of left wrist    Depression    denies   Exertional dyspnea    "sometimes laying down" (09/14/2012)   Headache    frequent headaches,usually if she does not eat   Hyperlipidemia    Hypertension    sees Dr. Debby Freiberg, primary   Pneumonia 03/2012   PONV (postoperative nausea and vomiting)    Thyroid disease 1960's   "don't have it now" (09/14/2012)   Type II diabetes mellitus ([place])    Urinary tract infection    hx of   Vomiting    pt states she vomits every am    Past Surgical History:  Procedure Laterality Date   CARDIAC CATHETERIZATION  05/13/2012   mod luminal irregularity of pLAD, no sign CAD, EF 65%.   CHOLECYSTECTOMY  1970's   KYPHOPLASTY N/A 02/10/2013   Procedure:  LUMBAR TWO KYPHOPLASTY;  Surgeon: Ophelia Charter, MD;  Location: [facility];  Service: Neurosurgery;  Laterality: N/A;  L2  Kyphoplasty; Will use Stern's Carm.   ORIF HUMERUS FRACTURE Right 01/06/2018   Procedure: OPEN REDUCTION INTERNAL FIXATION (ORIF) RIGHT PROXIMAL HUMERUS FRACTURE;  Surgeon: Leandrew Koyanagi, MD;  Location: [facility];  Service: Orthopedics;  Laterality: Right;   TOTAL ELBOW REPLACEMENT  ~ 2005   "right" (09/14/2012)   TOTAL KNEE ARTHROPLASTY  09/13/2012   Procedure: TOTAL KNEE ARTHROPLASTY;  Surgeon: Vickey Huger, MD;  Location: [facility];  Service: Orthopedics;  Laterality: Right;   TOTAL KNEE ARTHROPLASTY Left 08/02/2018   Procedure: LEFT TOTAL KNEE ARTHROPLASTY;  Surgeon: Leandrew Koyanagi, MD;  Location: [facility];  Service: Orthopedics;  Laterality: Left;   TUBAL LIGATION  1970's   VAGINAL  HYSTERECTOMY  1970's    Social history:  reports that she quit smoking about 13 years ago. Her smoking use included cigarettes. She has a 5.00 pack-year smoking history. She has been exposed to tobacco smoke. She has never used smokeless tobacco. She reports that she does not drink alcohol and does not use drugs.   Allergies  Allergen Reactions   Lisinopril Shortness Of Breath   Amlodipine Swelling    UNSPECIFIED EDEMA   Tape Other (See Comments)    Tore skin--?hypofix   Morphine And Related Nausea And Vomiting   Percocet [Oxycodone-Acetaminophen] Nausea And Vomiting    Family History  Problem Relation Age of Onset   Arthritis Mother    Arthritis Father    Hypertension Father    Diabetes Father    Colon cancer Neg Hx       Prior to Admission medications   Medication Sig Start Date End Date Taking? Authorizing Provider  albuterol (PROVENTIL) (2.5 MG/3ML) 0.083% nebulizer solution Inhale 2.5 mg into the lungs every 6 (six) hours as needed for wheezing or shortness of breath. Reported on 01/17/2016    [provider]  atorvastatin (LIPITOR) 20 MG tablet TAKE 1 TABLET BY MOUTH DAILY 07/07/22   Janith Lima, MD  Blood Glucose Monitoring Suppl (ONETOUCH VERIO IQ SYSTEM) w/Device KIT 1 Act by Does not apply route 3 (three) times daily. 10/16/21   Janith Lima, MD  celecoxib (CELEBREX) 50 MG capsule Take 1 capsule (50 mg total) by mouth 2 (two) times daily. 04/18/22   Janith Lima, MD  Continuous Blood Gluc Receiver ([place]) [place] 1 Act by Does not apply route daily. 10/28/22   Janith Lima, MD  Continuous Blood Gluc Sensor (DEXCOM G7 SENSOR) MISC 1 Act by Does not apply route daily. 10/28/22   Janith Lima, MD  FARXIGA 10 MG TABS tablet TAKE 1 TABLET BY MOUTH EVERY DAY BEFORE BREAKFAST 09/03/22   Janith Lima, MD  glucose blood [facility] VERIO) test strip USE TO TEST THREE TIMES DAILY 10/31/22   Janith Lima, MD  insulin glargine, 2 Unit Dial, (TOUJEO  MAX SOLOSTAR) 300 UNIT/ML Solostar Pen Inject 30 Units into the skin daily. 11/06/22   Janith Lima, MD  irbesartan (AVAPRO) 300 MG tablet TAKE 1 TABLET BY MOUTH DAILY 08/04/22   Janith Lima, MD  Lancets [facility] ULTRASOFT) lancets 1 each by Other route 3 (three) times daily. Use as instructed 10/16/21   Janith Lima, MD  metFORMIN (GLUCOPHAGE) 1000 MG tablet Take 1 tablet (1,000 mg total) by mouth 2 (two) times daily with a meal. 10/28/22   Janith Lima, MD  omeprazole (PRILOSEC) 40 MG capsule TAKE 1 CAPSULE BY MOUTH DAILY 30 MINUTES BEFORE LUNCH 06/17/22   Willia Craze,  NP  SURE COMFORT PEN NEEDLES 32G X 4 MM MISC USE 1 PEN NEEDLE ONCE A WEEK 11/11/21   Janith Lima, MD  tirzepatide [facility]) 2.5 MG/0.5ML Pen Inject 2.5 mg into the skin once a week. 11/06/22   Janith Lima, MD    Physical Exam: Vitals:   11/21/22 1253 11/21/22 1253 11/21/22 1424 11/21/22 1639  BP:  (!) 146/116 106/67   Pulse:  93 89   Resp:  18    Temp:  (!) 97.5 F (36.4 C)  98.1 F (36.7 C)  TempSrc:  Oral  Oral  SpO2:  100% 92%   Weight: 95.3 kg     Height: 5' 3"$  (1.6 m)       Constitutional: NAD, calm, comfortable Eyes: PERRL, lids and conjunctivae normal ENMT: Mucous membranes are moist. Posterior pharynx clear of any exudate or lesions.Normal dentition.  Neck: normal, supple, no masses, no thyromegaly Respiratory: clear to auscultation bilaterally, no wheezing, no crackles. Normal respiratory effort. No accessory muscle use.  Cardiovascular: Regular rate and rhythm, no murmurs / rubs / gallops. No extremity edema. 2+ pedal pulses. No carotid bruits.  Abdomen: no tenderness, no masses palpated. No hepatosplenomegaly. Bowel sounds positive.  Musculoskeletal: no clubbing / cyanosis. No joint deformity upper and lower extremities. Good ROM, no contractures. Normal muscle tone.  Neurologic: CN 2-12 grossly intact. Sensation intact, DTR normal. Strength 5/5 in all 4.  Psychiatric: Normal judgment  and insight. Alert and oriented x 3. Normal mood.  SKIN/catheters: no rashes, lesions, ulcers. No induration  Labs on Admission: I have personally reviewed following labs and imaging studies  CBC: Recent Labs  Lab 11/21/22 1312 11/21/22 1524  WBC 8.6  --   NEUTROABS 4.8  --   HGB 14.4 13.9  HCT 45.8 41.0  MCV 92.9  --   PLT 183  --    Basic Metabolic Panel: Recent Labs  Lab 11/21/22 1312 11/21/22 1524 11/21/22 1715  NA 136 138 138  K 4.1 3.9 4.1  CL 103  --  105  CO2 18*  --  20*  GLUCOSE 387*  --  199*  BUN 26*  --  25*  CREATININE 1.09*  --  1.00  CALCIUM 9.8  --  9.0  MG 2.1  --   --    GFR: Estimated Creatinine Clearance: 57.5 mL/min (by C-G formula based on SCr of 1 mg/dL). Recent Labs  Lab 11/21/22 1312  WBC 8.6   Liver Function Tests: Recent Labs  Lab 11/21/22 1312  AST 21  ALT 18  ALKPHOS 82  BILITOT 1.1  PROT 7.1  ALBUMIN 4.0   No results for input(s): "LIPASE", "AMYLASE" in the last 168 hours. No results for input(s): "AMMONIA" in the last 168 hours. Coagulation Profile: No results for input(s): "INR", "PROTIME" in the last 168 hours. Cardiac Enzymes: No results for input(s): "CKTOTAL", "CKMB", "CKMBINDEX", "TROPONINI" in the last 168 hours. BNP (last 3 results) No results for input(s): "PROBNP" in the last 8760 hours. HbA1C: No results for input(s): "HGBA1C" in the last 72 hours. CBG: Recent Labs  Lab 11/21/22 1257 11/21/22 1613 11/21/22 1829  GLUCAP 336* 206* 149*   Lipid Profile: No results for input(s): "CHOL", "HDL", "LDLCALC", "TRIG", "CHOLHDL", "LDLDIRECT" in the last 72 hours. Thyroid Function Tests: No results for input(s): "TSH", "T4TOTAL", "FREET4", "T3FREE", "THYROIDAB" in the last 72 hours. Anemia Panel: No results for input(s): "VITAMINB12", "FOLATE", "FERRITIN", "TIBC", "IRON", "RETICCTPCT" in the last 72 hours. Urine analysis:  Component Value Date/Time   COLORURINE STRAW (A) 11/21/2022 1249   APPEARANCEUR  CLEAR 11/21/2022 1249   LABSPEC 1.022 11/21/2022 1249   PHURINE 5.0 11/21/2022 1249   GLUCOSEU >=500 (A) 11/21/2022 1249   GLUCOSEU >=1000 (A) 10/27/2022 1546   HGBUR NEGATIVE 11/21/2022 1249   BILIRUBINUR NEGATIVE 11/21/2022 1249   KETONESUR NEGATIVE 11/21/2022 1249   PROTEINUR NEGATIVE 11/21/2022 1249   UROBILINOGEN 0.2 10/27/2022 1546   NITRITE NEGATIVE 11/21/2022 1249   LEUKOCYTESUR NEGATIVE 11/21/2022 1249    Radiological Exams on Admission: Personally reviewed  No results found.  EKG: Independently reviewed.  Sinus tachycardia, LVH pattern, LAFB.     Assessment and Plan:   Principal Problem:   Hyperglycemia due to diabetes mellitus ([place]) Active Problems:   HTN (hypertension), benign   Hyperlipidemia with target LDL less than 100   Allergic rhinitis   Insulin-requiring or dependent type II diabetes mellitus ([place])    1.  Uncontrolled diabetes with hyperglycemia: Patient on presentation did have bicarb 18 but beta hydroxybutyrate normal and labs including blood glucose/bicarb/anion gap improved with IV hydration only.  At this point, do not feel patient had any significant DKA or need for insulin drip.  Will continue IV hydration and resume Lantus at 15 units nightly along with sliding scale insulin..  Titrate as needed overnight.  Will let her eat-diabetic diet with diabetes education in AM.  Her last hemoglobin A1c in January was in fact elevated at 10.  Will recheck.  Patient will need an insulin regimen that can be practical and covered by insurance upon discharge.  She states she recently changed her insurance plan and is now with [employer].  2.  Blurry vision, dizziness, polyuria: All symptoms related to problem #1 and should improve with improvement in blood glucose levels.  Patient reassured.  3.  Hypertension: Resume home meds.  4.  Hyperlipidemia: Resume home meds  5.  Asthma: Resume home inhaler therapy as needed.  No acute issues currently.  DVT prophylaxis:  Lovenox   Code Status:   Full code.Health care proxy would be her husband  Patient/Family Communication: Discussed with patient and all questions answered to satisfaction.  Consults called: Diabetes coordinator Admission status :Patient will be admitted under OBSERVATION status.The patient's presenting symptoms, physical exam findings, and initial radiographic and laboratory data in the context of their medical condition is felt to place them at low risk for further clinical deterioration. Furthermore, it is anticipated that the patient will be medically stable for discharge from the hospital within 2 midnights of hospital stay.       Guilford Shi MD [facility] Pager in [place]  If 7PM-7AM, please contact night-coverage [email or web address]   11/21/2022, 6:54 PM

## 2022-11-21 NOTE — ED Provider Triage Note (Signed)
Emergency Medicine Provider Triage Evaluation Note  Abigail Collins , a 72 y.o. female  was evaluated in triage.  Pt complains of hyperglycemia.  Patient reports as blood sugars been elevated for the last month that she has stopped taking Ozempic she cannot afford medication.  Patient is currently taking metformin for diabetes management but states her blood sugars are running typically above 200 at all times when she checks.  She reports she has had nausea and [facility]aritans and some abdominal pain.  Denies any significant changes in urinary output.  Denies chest pain, shortness of breath.  Review of Systems  Positive: As above Negative: As above  Physical Exam  BP (!) 146/116 (BP Location: Right Arm)   Pulse 93   Temp (!) 97.5 F (36.4 C) (Oral)   Resp 18   Ht 5' 3"$  (1.6 m)   Wt 95.3 kg   SpO2 100%   BMI 37.20 kg/m  Gen:   Awake, no distress Resp:  Normal effort  MSK:   Moves extremities without difficulty  Other:    Medical Decision Making  Medically screening exam initiated at 1:11 PM.  Appropriate orders placed.  Abigail Collins was informed that the remainder of the evaluation will be completed by another provider, this initial triage assessment does not replace that evaluation, and the importance of remaining in the ED until their evaluation is complete.     Luvenia Heller, PA-C 11/21/22 1313

## 2022-11-21 NOTE — ED Notes (Signed)
ED TO INPATIENT HANDOFF REPORT  ED Nurse Name and Phone #: Jkayla Spiewak [phone]  S Name/Age/Gender Abigail Collins 72 y.o. female Room/Bed: 004C/004C  Code Status   Code Status: Full Code  Home/SNF/Other Home Patient oriented to: self, place, time, and situation Is this baseline? Yes   Triage Complete: Triage complete  Chief Complaint Hyperglycemia due to diabetes mellitus ([place]) [E11.65]  Triage Note Reports took her blood sugar this morning and it was 497 and complains of blurry vision and feeling as though  she may pass out.  Denies any other symptoms or sickness. Reports she took metformin and 50 units of lantus this mornign.    Allergies Allergies  Allergen Reactions   Lisinopril Shortness Of Breath   Amlodipine Swelling    UNSPECIFIED EDEMA   Tape Other (See Comments)    Tore skin--?hypofix   Morphine And Related Nausea And Vomiting   Percocet [Oxycodone-Acetaminophen] Nausea And Vomiting    Level of Care/Admitting Diagnosis ED Disposition     ED Disposition  Admit   Condition  --   Comment  Hospital Area: [place] [[id number]]  Level of Care: Med-Surg [16]  May place patient in observation at [facility] or [place] if equivalent level of care is available:: Yes  Covid Evaluation: Asymptomatic - no recent exposure (last 10 days) testing not required  Diagnosis: Hyperglycemia due to diabetes mellitus [facility]) [id number]  Admitting Physician: Guilford Shi [id number]  Attending Physician: Guilford Shi [id number]          B Medical/Surgery History Past Medical History:  Diagnosis Date   Anemia    hx of   Anginal pain ([place])    "left arm pain, sees Dr. Etter Sjogren, had card cath 2013"   Arthritis    "all over" (09/14/2012)   Asthma    takes inhaler 2x day   Bronchitis    hx of   Bulging disc    "lower"   Carpal tunnel syndrome of left wrist    Depression    denies   Exertional dyspnea    "sometimes laying down" (09/14/2012)    Headache    frequent headaches,usually if she does not eat   Hyperlipidemia    Hypertension    sees Dr. Debby Freiberg, primary   Pneumonia 03/2012   PONV (postoperative nausea and vomiting)    Thyroid disease 1960's   "don't have it now" (09/14/2012)   Type II diabetes mellitus ([place])    Urinary tract infection    hx of   Vomiting    pt states she vomits every am   Past Surgical History:  Procedure Laterality Date   CARDIAC CATHETERIZATION  05/13/2012   mod luminal irregularity of pLAD, no sign CAD, EF 65%.   CHOLECYSTECTOMY  1970's   KYPHOPLASTY N/A 02/10/2013   Procedure:  LUMBAR TWO KYPHOPLASTY;  Surgeon: Ophelia Charter, MD;  Location: [facility];  Service: Neurosurgery;  Laterality: N/A;  L2 Kyphoplasty; Will use Stern's Carm.   ORIF HUMERUS FRACTURE Right 01/06/2018   Procedure: OPEN REDUCTION INTERNAL FIXATION (ORIF) RIGHT PROXIMAL HUMERUS FRACTURE;  Surgeon: Leandrew Koyanagi, MD;  Location: [place];  Service: Orthopedics;  Laterality: Right;   TOTAL ELBOW REPLACEMENT  ~ 2005   "right" (09/14/2012)   TOTAL KNEE ARTHROPLASTY  09/13/2012   Procedure: TOTAL KNEE ARTHROPLASTY;  Surgeon: Vickey Huger, MD;  Location: [place];  Service: Orthopedics;  Laterality: Right;   TOTAL KNEE ARTHROPLASTY Left 08/02/2018   Procedure: LEFT TOTAL KNEE ARTHROPLASTY;  Surgeon: Leandrew Koyanagi, MD;  Location: [place];  Service: Orthopedics;  Laterality: Left;   TUBAL LIGATION  1970's   VAGINAL HYSTERECTOMY  1970's     A IV Location/Drains/Wounds Patient Lines/Drains/Airways Status     Active Line/Drains/Airways     Name Placement date Placement time Site Days   Peripheral IV 11/21/22 20 G Right Forearm 11/21/22  1421  Forearm  less than 1   Incision (Closed) 01/06/18 Arm Right 01/06/18  1338  -- 1780   Incision (Closed) 08/02/18 Knee Left 08/02/18  0924  -- 1572            Intake/Output Last 24 hours No intake or output data in the 24 hours ending 11/21/22 1839  Labs/Imaging Results for orders  placed or performed during the hospital encounter of 11/21/22 (from the past 48 hour(s))  Urinalysis, Routine w reflex microscopic -Urine, Clean Catch     Status: Abnormal   Collection Time: 11/21/22 12:49 PM  Result Value Ref Range   Color, Urine STRAW (A) YELLOW   APPearance CLEAR CLEAR   Specific Gravity, Urine 1.022 1.005 - 1.030   pH 5.0 5.0 - 8.0   Glucose, UA >=500 (A) NEGATIVE mg/dL   Hgb urine dipstick NEGATIVE NEGATIVE   Bilirubin Urine NEGATIVE NEGATIVE   Ketones, ur NEGATIVE NEGATIVE mg/dL   Protein, ur NEGATIVE NEGATIVE mg/dL   Nitrite NEGATIVE NEGATIVE   Leukocytes,Ua NEGATIVE NEGATIVE   RBC / HPF 0-5 0 - 5 RBC/hpf   WBC, UA 0-5 0 - 5 WBC/hpf   Bacteria, UA NONE SEEN NONE SEEN   Squamous Epithelial / HPF 0-5 0 - 5 /HPF   Mucus PRESENT    Budding Yeast PRESENT     Comment: Performed at [facility], [place] [address] [place], [place] [address]  CBG monitoring, ED     Status: Abnormal   Collection Time: 11/21/22 12:57 PM  Result Value Ref Range   Glucose-Capillary 336 (H) 70 - 99 mg/dL    Comment: Glucose reference range applies only to samples taken after fasting for at least 8 hours.  Comprehensive metabolic panel     Status: Abnormal   Collection Time: 11/21/22  1:12 PM  Result Value Ref Range   Sodium 136 135 - 145 mmol/L   Potassium 4.1 3.5 - 5.1 mmol/L   Chloride 103 98 - 111 mmol/L   CO2 18 (L) 22 - 32 mmol/L   Glucose, Bld 387 (H) 70 - 99 mg/dL    Comment: Glucose reference range applies only to samples taken after fasting for at least 8 hours.   BUN 26 (H) 8 - 23 mg/dL   Creatinine, Ser 1.09 (H) 0.44 - 1.00 mg/dL   Calcium 9.8 8.9 - 10.3 mg/dL   Total Protein 7.1 6.5 - 8.1 g/dL   Albumin 4.0 3.5 - 5.0 g/dL   AST 21 15 - 41 U/L   ALT 18 0 - 44 U/L   Alkaline Phosphatase 82 38 - 126 U/L   Total Bilirubin 1.1 0.3 - 1.2 mg/dL   GFR, Estimated 55 (L) >60 mL/min    Comment: (NOTE) Calculated using the CKD-EPI Creatinine Equation (2021)    Anion gap  15 5 - 15    Comment: Performed at [facility] [address], [place], [place] [address]  CBC with Differential     Status: None   Collection Time: 11/21/22  1:12 PM  Result Value Ref Range   WBC 8.6 4.0 -  10.5 K/uL   RBC 4.93 3.87 - 5.11 MIL/uL   Hemoglobin 14.4 12.0 - 15.0 g/dL   HCT 45.8 36.0 - 46.0 %   MCV 92.9 80.0 - 100.0 fL   MCH 29.2 26.0 - 34.0 pg   MCHC 31.4 30.0 - 36.0 g/dL   RDW 14.5 11.5 - 15.5 %   Platelets 183 150 - 400 K/uL   nRBC 0.0 0.0 - 0.2 %   Neutrophils Relative % 55 %   Neutro Abs 4.8 1.7 - 7.7 K/uL   Lymphocytes Relative 36 %   Lymphs Abs 3.1 0.7 - 4.0 K/uL   Monocytes Relative 6 %   Monocytes Absolute 0.5 0.1 - 1.0 K/uL   Eosinophils Relative 2 %   Eosinophils Absolute 0.2 0.0 - 0.5 K/uL   Basophils Relative 1 %   Basophils Absolute 0.0 0.0 - 0.1 K/uL   Immature Granulocytes 0 %   Abs Immature Granulocytes 0.02 0.00 - 0.07 K/uL    Comment: Performed at [facility] [address], [place], [place] [address]  Magnesium     Status: None   Collection Time: 11/21/22  1:12 PM  Result Value Ref Range   Magnesium 2.1 1.7 - 2.4 mg/dL    Comment: Performed at [facility] [address], [place], [place] [address]  Beta-hydroxybutyric acid     Status: None   Collection Time: 11/21/22  1:30 PM  Result Value Ref Range   Beta-Hydroxybutyric Acid 0.17 0.05 - 0.27 mmol/L    Comment: Performed at [facility] [address] [place], [place] [address]  I-Stat venous blood gas, [facility] ED, [facility], [facility])     Status: Abnormal   Collection Time: 11/21/22  3:24 PM  Result Value Ref Range   pH, Ven 7.279 7.25 - 7.43   pCO2, Ven 48.2 44 - 60 mmHg   pO2, Ven 44 32 - 45 mmHg   Bicarbonate 22.6 20.0 - 28.0 mmol/L   TCO2 24 22 - 32 mmol/L   O2 Saturation 73 %   Acid-base deficit 4.0 (H) 0.0 - 2.0 mmol/L   Sodium 138 135 - 145 mmol/L   Potassium 3.9 3.5 - 5.1 mmol/L   Calcium, Ion 1.25 1.15 - 1.40 mmol/L   HCT 41.0 36.0 - 46.0 %   Hemoglobin  13.9 12.0 - 15.0 g/dL   Sample type VENOUS   POC CBG, ED     Status: Abnormal   Collection Time: 11/21/22  4:13 PM  Result Value Ref Range   Glucose-Capillary 206 (H) 70 - 99 mg/dL    Comment: Glucose reference range applies only to samples taken after fasting for at least 8 hours.  Basic metabolic panel     Status: Abnormal   Collection Time: 11/21/22  5:15 PM  Result Value Ref Range   Sodium 138 135 - 145 mmol/L   Potassium 4.1 3.5 - 5.1 mmol/L   Chloride 105 98 - 111 mmol/L   CO2 20 (L) 22 - 32 mmol/L   Glucose, Bld 199 (H) 70 - 99 mg/dL    Comment: Glucose reference range applies only to samples taken after fasting for at least 8 hours.   BUN 25 (H) 8 - 23 mg/dL   Creatinine, Ser 1.00 0.44 - 1.00 mg/dL   Calcium 9.0 8.9 - 10.3 mg/dL   GFR, Estimated >60 >60 mL/min    Comment: (NOTE) Calculated using the CKD-EPI Creatinine Equation (2021)    Anion gap 13 5 -  15    Comment: Performed at [facility], [place] [address], [place], [place] [address]  CBG monitoring, ED     Status: Abnormal   Collection Time: 11/21/22  6:29 PM  Result Value Ref Range   Glucose-Capillary 149 (H) 70 - 99 mg/dL    Comment: Glucose reference range applies only to samples taken after fasting for at least 8 hours.   No results found.  Pending Labs Unresulted Labs (From admission, onward)     Start     Ordered   11/28/22 0500  Creatinine, serum  (enoxaparin (LOVENOX)    CrCl >/= 30 ml/min)  Weekly,   R     Comments: while on enoxaparin therapy    11/21/22 1816   11/22/22 [id number]  Basic metabolic panel  (Hyperglycemia (not DKA or HHS))  Daily,   R      11/21/22 1816   11/21/22 1814  HIV Antibody (routine testing w rflx)  (HIV Antibody (Routine testing w reflex) panel)  Once,   R        11/21/22 1816   11/21/22 1814  CBC  (enoxaparin (LOVENOX)    CrCl >/= 30 ml/min)  Once,   R       Comments: Baseline for enoxaparin therapy IF NOT ALREADY DRAWN.  Notify MD if PLT < 100 K.    11/21/22 1816             Vitals/Pain Today's Vitals   11/21/22 1253 11/21/22 1253 11/21/22 1424 11/21/22 1639  BP:  (!) 146/116 106/67   Pulse:  93 89   Resp:  18    Temp:  (!) 97.5 F (36.4 C)  98.1 F (36.7 C)  TempSrc:  Oral  Oral  SpO2:  100% 92%   Weight: 95.3 kg     Height: 5' 3"$  (1.6 m)     PainSc: 0-No pain       Isolation Precautions No active isolations  Medications Medications  celecoxib (CELEBREX) capsule 50 mg (has no administration in time range)  atorvastatin (LIPITOR) tablet 20 mg (has no administration in time range)  irbesartan (AVAPRO) tablet 300 mg (has no administration in time range)  insulin glargine (LANTUS) Solostar Pen 15 Units (has no administration in time range)  metFORMIN (GLUCOPHAGE) tablet 1,000 mg (has no administration in time range)  pantoprazole (PROTONIX) EC tablet 40 mg (has no administration in time range)  albuterol (PROVENTIL) (2.5 MG/3ML) 0.083% nebulizer solution 2.5 mg (has no administration in time range)  enoxaparin (LOVENOX) injection 40 mg (has no administration in time range)  dextrose 50 % solution 0-50 mL (has no administration in time range)  insulin aspart (novoLOG) injection 0-15 Units (has no administration in time range)  insulin aspart (novoLOG) injection 0-5 Units (has no administration in time range)  sodium chloride 0.9 % bolus 500 mL (0 mLs Intravenous Stopped 11/21/22 1507)  sodium chloride 0.9 % bolus 500 mL (0 mLs Intravenous Stopped 11/21/22 1839)    Mobility walks with device     Focused Assessments    R Recommendations: See Admitting Provider Note  Report given to:   Additional Notes:

## 2022-11-22 DIAGNOSIS — Z794 Long term (current) use of insulin: Secondary | ICD-10-CM | POA: Diagnosis not present

## 2022-11-22 DIAGNOSIS — E118 Type 2 diabetes mellitus with unspecified complications: Secondary | ICD-10-CM

## 2022-11-22 DIAGNOSIS — E1165 Type 2 diabetes mellitus with hyperglycemia: Secondary | ICD-10-CM | POA: Diagnosis not present

## 2022-11-22 DIAGNOSIS — E119 Type 2 diabetes mellitus without complications: Secondary | ICD-10-CM | POA: Diagnosis not present

## 2022-11-22 DIAGNOSIS — M170 Bilateral primary osteoarthritis of knee: Secondary | ICD-10-CM

## 2022-11-22 DIAGNOSIS — M545 Low back pain, unspecified: Secondary | ICD-10-CM | POA: Diagnosis not present

## 2022-11-22 DIAGNOSIS — G8929 Other chronic pain: Secondary | ICD-10-CM

## 2022-11-22 LAB — BASIC METABOLIC PANEL
Anion gap: 10 (ref 5–15)
BUN: 19 mg/dL (ref 8–23)
CO2: 22 mmol/L (ref 22–32)
Calcium: 8.8 mg/dL — ABNORMAL LOW (ref 8.9–10.3)
Chloride: 106 mmol/L (ref 98–111)
Creatinine, Ser: 0.97 mg/dL (ref 0.44–1.00)
GFR, Estimated: >60 mL/min (ref >60)
Glucose, Bld: 185 mg/dL — ABNORMAL HIGH (ref 70–99)
Potassium: 4.1 mmol/L (ref 3.5–5.1)
Sodium: 138 mmol/L (ref 135–145)

## 2022-11-22 LAB — GLUCOSE, CAPILLARY
Glucose-Capillary: 156 mg/dL — ABNORMAL HIGH (ref 70–99)
Glucose-Capillary: 159 mg/dL — ABNORMAL HIGH (ref 70–99)

## 2022-11-22 MED ORDER — METFORMIN HCL 1000 MG PO TABS: 1000.0000 mg | ORAL_TABLET | Freq: Two times a day (BID) | ORAL | 3 refills | Status: DC

## 2022-11-22 MED ORDER — DAPAGLIFLOZIN PROPANEDIOL 10 MG PO TABS: 10.0000 mg | ORAL_TABLET | Freq: Every day | ORAL | 2 refills | Status: DC

## 2022-11-22 MED ORDER — GLIMEPIRIDE 4 MG PO TABS: 4.0000 mg | ORAL_TABLET | ORAL | 4 refills | Status: DC

## 2022-11-22 MED ORDER — CELECOXIB 50 MG PO CAPS: 50.0000 mg | ORAL_CAPSULE | Freq: Two times a day (BID) | ORAL | 1 refills | Status: DC

## 2022-11-22 NOTE — Progress Notes (Addendum)
Inpatient Diabetes Program Recommendations  AACE/ADA: New Consensus Statement on Inpatient Glycemic Control (2015)  Target Ranges:  Prepandial:   less than 140 mg/dL      Peak postprandial:   less than 180 mg/dL (1-2 hours)      Critically ill patients:  140 - 180 mg/dL   Lab Results  Component Value Date   GLUCAP 159 (H) 11/22/2022   HGBA1C 10.0 (H) 10/27/2022    Review of Glycemic Control  Latest Reference Range & Units 11/21/22 18:29 11/21/22 20:44 11/22/22 07:33 11/22/22 11:59  Glucose-Capillary 70 - 99 mg/dL 149 (H) 140 (H) 156 (H) 159 (H)   Diabetes history: DM 2 Outpatient Diabetes medications:  Farxiga 10 mg daily Metformin 1000 mg bid Current orders for Inpatient glycemic control:  Semglee 15 units daily Metformin 1000 mg bid Inpatient Diabetes Program Recommendations:    Call received from Dr. Tana Coast regarding patient. She had been on GLP in the past however it was not covered by her insurance.  Likely will need basal insulin at discharge.  Per Dr. Tana Coast- plan is to d/c on basal insulin, metformin, and Farxiga.   Spoke to patient by phone and she states that her MD ordered insulin in January, but when she went to pick it up, it was 105$ and she could not afford it.   [place] to confirm price and they state the 105$ is for a 3 month supply.  Patient can instead get the insulin monthly for 35$ a month.  Discussed with Dr. Tana Coast.  Per patient she cannot even afford 35$ at this time b/c she does not get paid again until the 2nd of March.  Dr. Tana Coast is going to reach out to pharmacy/TOC.  Will continue to follow.   Thanks,  Adah Perl, RN, BC-ADM Inpatient Diabetes Coordinator Pager [phone]  [phone]- Spoke with MD again.  Seems reasonable to add another oral agent (generic) to her current home regimen and have her monitor blood sugars at home instead of insulin.  Will need to f/u with PCP ASAP. MD will call patient to inform her of plan.

## 2022-11-22 NOTE — Progress Notes (Signed)
Patient with discharge order in place.  Patient said that she will not be able to afford getting farxiga.  Dr. Tana Coast made aware.  Waiting for Dr. Josem Kaufmann advise.

## 2022-11-22 NOTE — Progress Notes (Signed)
Per Dr. Tana Coast and Diabetes RN specialist, it is ok for the pt to be only on amaryl and metformin in case pt's farxiga runs out and is unable to afford to buy farxiga.  Education emphasized and discussed at length - diabetes management, checking BG at least 2x a day everyday, diet, exercise, and keeping in touch with her PCP updating them with her BG trend.  Encouraged to keep follow up appointments as discussed.  Encouraged to call PCP for concerns or questions.  Patient and spouse both verbalized understanding of instructions.

## 2022-11-22 NOTE — Progress Notes (Addendum)
[facility]                                                                              Abigail Collins, is a 72 y.o. female, DOB - Jun 01, 1951, [id number] Admit date - 11/21/2022    Outpatient Primary MD for the patient is Janith Lima, MD  LOS - 0  days  Chief Complaint  Patient presents with   Hyperglycemia       Brief summary   Patient is a 72 year old female with longstanding history of diabetes mellitus for 25 years presented with hyperglycemia, blurry vision, polyuria and polydipsia in the last few days.  Per patient, for many years her diabetes was managed with metformin only, subsequently added on Lantus for few years.  Around 2019, she was initiated on Ozempic once a week which helped her lose about 60lbs weight.  As her blood sugars were doing well, Lantus was discontinued.  However subsequently she could not afford Ozempic due to insurance issues.  She has been off Ozempic since March 2022.  She saw her PCP in 10/2022 and was prescribed Glargine-Lixisenatide Willeen Niece), semaglutide was discontinued however her insurance would not cover this medication.  Over the last few days, patient began to experience blurry vision, polyuria, dizziness and CBGs ranging 200s to 300s. On the morning of admission, she ate a cheese sandwich and drank hazelnut coffee after which her blood glucose level was 484.  She took metformin however subsequently CBG was even worse in 500s and she presented to ED. In ED, anion gap 15, blood glucose 387 bicarb 18, creatinine 1.09, BHB normal. Patient was admitted due to recurrent hyperglycemia with risk of DKA/ HONK without a safe plan  Assessment & Plan    Principal Problem:   Hyperglycemia due to uncontrolled DM type II(HCC) -BHB normal on admission, anion gap 15, bicarb 18 -Patient was placed on IV fluid hydration, Lantus was resumed at 15 units with sliding scale insulin.  Also started on metformin. -Diabetic coordinator consulted,  patient will need an insulin regiment that will be covered by insurance upon discharge. -Also recommended referral to endocrinology and outpatient close follow-up with her PCP -Hemoglobin A1c 10.0 on 10/27/2022 CBG (last 3)  Recent Labs    11/21/22 2044 11/22/22 0733 11/22/22 1159  GLUCAP 140* 156* 159*     Active Problems:   HTN (hypertension), benign -BP stable, continue Avapro    Hyperlipidemia with target LDL less than 100 -Continue Lipitor  Asthma -Currently stable, no wheezing  Obesity Estimated body mass index is 36.4 kg/m as calculated from the following:   Height as of this encounter: 5' 4"$  (1.626 m).   Weight as of this encounter: 96.2 kg.  Code Status: Full CODE STATUS DVT Prophylaxis:  enoxaparin (LOVENOX) injection 40 mg Start: 11/21/22 1830   Level of Care: Level of care: Med-Surg Family Communication: Updated patient Disposition Plan:      Remains inpatient appropriate: Diabetic coordinator assisting with insulin regimen that will work with her insurance.  Unfortunately patient reports that she has no money and unable to afford insulin until she gets the paycheck next month on 3/2.  Procedures:  None  Consultants:   Diabetic coronary  Antimicrobials: None   Medications  atorvastatin  20 mg Oral Daily   celecoxib  100 mg Oral Daily   enoxaparin (LOVENOX) injection  40 mg Subcutaneous Q24H   insulin aspart  0-15 Units Subcutaneous TID WC   insulin aspart  0-5 Units Subcutaneous QHS   insulin glargine-yfgn  15 Units Subcutaneous Q24H   irbesartan  300 mg Oral Daily   metFORMIN  1,000 mg Oral BID WC   pantoprazole  40 mg Oral Daily      Subjective:   Sister Schlageter was seen and examined today.   Currently doing well, CBGs controlled however needs practical insulin or oral hypoglycemic regimen that will be covered by her insurance.  Patient denies dizziness, chest pain, shortness of breath, abdominal pain, N/V.   Objective:   Vitals:    11/22/22 0025 11/22/22 0428 11/22/22 0751 11/22/22 1207  BP: (!) 144/80 102/68 (!) 115/95 119/74  Pulse: 76 70 89 85  Resp: 14 16 14 14  $ Temp: 98.2 F (36.8 C) 97.7 F (36.5 C) 98.6 F (37 C) 98.2 F (36.8 C)  TempSrc: Oral Oral    SpO2: 97% 99% 98% 98%  Weight:      Height:        Intake/Output Summary (Last 24 hours) at 11/22/2022 1400 Last data filed at 11/22/2022 0925 Gross per 24 hour  Intake 720 ml  Output --  Net 720 ml     Wt Readings from Last 3 Encounters:  11/21/22 96.2 kg  10/27/22 95.3 kg  04/18/22 96.2 kg     Exam General: Alert and oriented x 3, NAD Cardiovascular: S1 S2 auscultated,  RRR Respiratory: Clear to auscultation bilaterally, no wheezing Gastrointestinal: Soft, nontender, nondistended, + bowel sounds Ext: no pedal edema bilaterally Neuro: no new FND's Skin: No rashes Psych: Normal affect and demeanor, alert and oriented x3     Data Reviewed:  I have personally reviewed following labs    CBC Lab Results  Component Value Date   WBC 7.2 11/21/2022   RBC 4.40 11/21/2022   HGB 12.9 11/21/2022   HCT 42.7 11/21/2022   MCV 97.0 11/21/2022   MCH 29.3 11/21/2022   PLT 156 11/21/2022   MCHC 30.2 11/21/2022   RDW 14.4 11/21/2022   LYMPHSABS 3.1 11/21/2022   MONOABS 0.5 11/21/2022   EOSABS 0.2 11/21/2022   BASOSABS 0.0 [id number]     Last metabolic panel Lab Results  Component Value Date   NA 138 11/22/2022   K 4.1 11/22/2022   CL 106 11/22/2022   CO2 22 11/22/2022   BUN 19 11/22/2022   CREATININE 0.97 11/22/2022   GLUCOSE 185 (H) 11/22/2022   GFRNONAA >60 11/22/2022   GFRAA >60 08/03/2018   CALCIUM 8.8 (L) 11/22/2022   PHOS 4.0 12/05/2015   PROT 7.1 11/21/2022   ALBUMIN 4.0 11/21/2022   BILITOT 1.1 11/21/2022   ALKPHOS 82 11/21/2022   AST 21 11/21/2022   ALT 18 11/21/2022   ANIONGAP 10 11/22/2022    CBG (last 3)  Recent Labs    11/21/22 2044 11/22/22 0733 11/22/22 1159  GLUCAP 140* 156* 159*      Coagulation  Profile: No results for input(s): "INR", "PROTIME" in the last 168 hours.   Radiology Studies: I have personally reviewed the imaging studies  No results found.     Estill Cotta M.D. [facility] 11/22/2022, 2:00 PM  Available via Epic secure chat 7am-7pm After 7 pm,  please refer to night coverage provider listed on amion.

## 2022-11-22 NOTE — Discharge Summary (Addendum)
Physician Discharge Summary   Patient: Abigail Collins MRN: [id number] DOB: 12/12/1950  Admit date:     11/21/2022  Discharge date: 11/22/22  Discharge Physician: Estill Cotta, MD    PCP: Janith Lima, MD   Recommendations at discharge:   Patient discharged on metformin 1000 mg BID, Farxiga 10 mg daily, Amaryl 4 mg daily Ambulatory referral to endocrinology sent. Patient recommended to check her blood sugars daily and log in for follow-up appointments  Discharge Diagnoses:    Hyperglycemia due to uncontrolled diabetes mellitus type II ([place])   HTN (hypertension), benign   Hyperlipidemia with target LDL less than 100 Asthma Obesity  Hospital Course:  Patient is a 72 year old female with longstanding history of diabetes mellitus for 25 years presented with hyperglycemia, blurry vision, polyuria and polydipsia in the last few days.  Per patient, for many years her diabetes was managed with metformin only, subsequently added on Lantus for few years.  Around 2019, she was initiated on Ozempic once a week which helped her lose about 60lbs weight.  As her blood sugars were doing well, Lantus was discontinued.  However subsequently she could not afford Ozempic due to insurance issues.  She has been off Ozempic since March 2022.  She saw her PCP in 10/2022 and was prescribed Glargine-Lixisenatide Willeen Niece), semaglutide was discontinued however her insurance would not cover this medication.  Over the last few days, patient began to experience blurry vision, polyuria, dizziness and CBGs ranging 200s to 300s. On the morning of admission, she ate a cheese sandwich and drank hazelnut coffee after which her blood glucose level was 484.  She took metformin however subsequently CBG was even worse in 500s and she presented to ED. In ED, anion gap 15, blood glucose 387 bicarb 18, creatinine 1.09, BHB normal. Patient was admitted due to recurrent hyperglycemia with risk of DKA/ HONK without a safe  plan   Assessment and Plan:  Hyperglycemia due to uncontrolled DM type II(HCC) -BHB normal on admission, anion gap 15, bicarb 18 -Patient was placed on IV fluid hydration, Lantus was started at 15 units with sliding scale insulin.  Also started on metformin. -Hemoglobin A1c 10.0 on 10/27/2022 CBG (last 3)  Recent Labs (last 2 labs)       Recent Labs    11/21/22 2044 11/22/22 0733 11/22/22 1159  GLUCAP 140* 156* 159*      - Unfortunately patient reports that she has no money and unable to afford insulin ($35 for 1 month and $105 for 61-monthsupply) until she gets the paycheck next month on 3/2.  She did not have DKA or HONK on presentation, hemoglobin A1c at borderline 10.0.  Discussed with diabetic coordinator and patient for safe disposition, started on metformin 1000 mg twice daily, Farxiga 10 mg daily, Amaryl 4 mg daily for discharge -Recommended strongly to follow-up with PCP, ambulatory referral to endocrinology sent        HTN (hypertension), benign -BP stable, continue Avapro     Hyperlipidemia with target LDL less than 100 -Continue Lipitor   Asthma -Currently stable, no wheezing   Obesity Estimated body mass index is 36.4 kg/m as calculated from the following:   Height as of this encounter: 5' 4"$  (1.626 m).   Weight as of this encounter: 96.2 kg.       Pain control - [place] was reviewed. and patient was instructed, not to drive, operate heavy machinery, perform activities at heights, swimming or participation in  water activities or provide baby-sitting services while on Pain, Sleep and Anxiety Medications; until their outpatient Physician has advised to do so again. Also recommended to not to take more than prescribed Pain, Sleep and Anxiety Medications.  Consultants: Diabetic coordinator Procedures performed: None Disposition: Home Diet recommendation:  Discharge Diet Orders (From admission, onward)      Start     Ordered   11/22/22 0000  Diet Carb Modified        11/22/22 1436           Carb modified diet DISCHARGE MEDICATION: Allergies as of 11/22/2022       Reactions   Lisinopril Shortness Of Breath   Amlodipine Swelling   UNSPECIFIED EDEMA   Tape Other (See Comments)   Tore skin--?hypofix   Morphine And Related Nausea And Vomiting   Percocet [oxycodone-acetaminophen] Nausea And Vomiting        Medication List     TAKE these medications    albuterol (2.5 MG/3ML) 0.083% nebulizer solution Commonly known as: PROVENTIL Inhale 2.5 mg into the lungs every 6 (six) hours as needed for wheezing or shortness of breath. Reported on 01/17/2016   atorvastatin 20 MG tablet Commonly known as: LIPITOR TAKE 1 TABLET BY MOUTH DAILY   celecoxib 50 MG capsule Commonly known as: CELEBREX Take 1 capsule (50 mg total) by mouth 2 (two) times daily.   dapagliflozin propanediol 10 MG Tabs tablet Commonly known as: Farxiga Take 1 tablet (10 mg total) by mouth daily before breakfast. What changed: See the new instructions.   Dexcom G7 Receiver Devi 1 Act by Does not apply route daily.   Dexcom G7 Sensor Misc 1 Act by Does not apply route daily.   glimepiride 4 MG tablet Commonly known as: Amaryl Take 1 tablet (4 mg total) by mouth every morning.   irbesartan 300 MG tablet Commonly known as: AVAPRO TAKE 1 TABLET BY MOUTH DAILY   metFORMIN 1000 MG tablet Commonly known as: GLUCOPHAGE Take 1 tablet (1,000 mg total) by mouth 2 (two) times daily with a meal.   omeprazole 40 MG capsule Commonly known as: PRILOSEC TAKE 1 CAPSULE BY MOUTH DAILY 30 MINUTES BEFORE LUNCH What changed: See the new instructions.   onetouch ultrasoft lancets 1 each by Other route 3 (three) times daily. Use as instructed   OneTouch Verio IQ System w/Device Kit 1 Act by Does not apply route 3 (three) times daily.   OneTouch Verio test strip Generic drug: glucose blood USE TO TEST THREE TIMES  DAILY   Sure Comfort Pen Needles 32G X 4 MM Misc Generic drug: Insulin Pen Needle USE 1 PEN NEEDLE ONCE A WEEK        Follow-up Information     Janith Lima, MD. Schedule an appointment as soon as possible for a visit in 2 week(s).   Specialty: Internal Medicine Why: for hospital follow-up Contact information: [place] [address] [phone]                Discharge Exam: Danley Danker Weights   11/21/22 1253 11/21/22 2025  Weight: 95.3 kg 96.2 kg   S: No acute complaints except not able to afford insulin and Ozempic not covered by insurance.  BP 119/74 (BP Location: Left Arm)   Pulse 85   Temp 98.2 F (36.8 C)   Resp 14   Ht 5' 4"$  (1.626 m)   Wt 96.2 kg   SpO2 98%   BMI 36.40 kg/m   Physical  Exam General: Alert and oriented x 3, NAD Cardiovascular: S1 S2 clear, RRR.  Respiratory: CTAB, no wheezing, rales or rhonchi Gastrointestinal: Soft, nontender, nondistended, NBS Ext: no pedal edema bilaterally Neuro: no new deficits Skin: No rashes Psych: Normal affect and demeanor, alert and oriented x3    Condition at discharge: fair  The results of significant diagnostics from this hospitalization (including imaging, microbiology, ancillary and laboratory) are listed below for reference.   Imaging Studies: No results found.  Microbiology: Results for orders placed or performed during the hospital encounter of 07/26/18  Surgical pcr screen     Status: None   Collection Time: 07/26/18  2:45 PM   Specimen: Nasal Mucosa; Nasal Swab  Result Value Ref Range Status   MRSA, PCR NEGATIVE NEGATIVE Final   Staphylococcus aureus NEGATIVE NEGATIVE Final    Comment: (NOTE) The Xpert SA Assay (FDA approved for NASAL specimens in patients 40 years of age and older), is one component of a comprehensive surveillance program. It is not intended to diagnose infection nor to guide or monitor treatment. Performed at [facility], [place] [address], [place], [place] [address]     Labs: CBC: Recent Labs  Lab 11/21/22 1312 11/21/22 1524 11/21/22 2006  WBC 8.6  --  7.2  NEUTROABS 4.8  --   --   HGB 14.4 13.9 12.9  HCT 45.8 41.0 42.7  MCV 92.9  --  97.0  PLT 183  --  [id number]   Basic Metabolic Panel: Recent Labs  Lab 11/21/22 1312 11/21/22 1524 11/21/22 1715 11/22/22 0346  NA 136 138 138 138  K 4.1 3.9 4.1 4.1  CL 103  --  105 106  CO2 18*  --  20* 22  GLUCOSE 387*  --  199* 185*  BUN 26*  --  25* 19  CREATININE 1.09*  --  1.00 0.97  CALCIUM 9.8  --  9.0 8.8*  MG 2.1  --   --   --    Liver Function Tests: Recent Labs  Lab 11/21/22 1312  AST 21  ALT 18  ALKPHOS 82  BILITOT 1.1  PROT 7.1  ALBUMIN 4.0   CBG: Recent Labs  Lab 11/21/22 1613 11/21/22 1829 11/21/22 2044 11/22/22 0733 11/22/22 1159  GLUCAP 206* 149* 140* 156* 159*    Discharge time spent: greater than 30 minutes.  Signed: Estill Cotta, MD [facility] 11/22/2022

## 2022-11-22 NOTE — Progress Notes (Signed)
RNCM received order for medication assistance.  Patient has private insurance and is not eligible for MATCH.  [facility] closed.  Provider made change to new prescriptions which are more affordable for patient and she will pick up at her local pharmacy.

## 2022-11-24 ENCOUNTER — Telehealth: Payer: Self-pay

## 2022-11-24 NOTE — Transitions of Care (Post Inpatient/ED Visit) (Signed)
   11/24/2022  Name: IDONIA ZOLLINGER MRN: [id number] DOB: December 22, 1950  Today's TOC FU Call Status: Today's TOC FU Call Status:: Successful TOC FU Call Competed TOC FU Call Complete Date: 11/24/22  Transition Care Management Follow-up Telephone Call Date of Discharge: 11/22/22 Discharge Facility: [facility] Type of Discharge: Inpatient Admission Primary Inpatient Discharge Diagnosis:: Hyperglycemia due to uncontrolled diabetes mellitus type II How have you been since you were released from the hospital?: Better Any questions or concerns?: No  Items Reviewed: Did you receive and understand the discharge instructions provided?: Yes Medications obtained and verified?: Yes (Medications Reviewed) Any new allergies since your discharge?: No Dietary orders reviewed?: Yes Do you have support at home?: Yes  Home Care and Equipment/Supplies: [place] Ordered?: NA Any new equipment or medical supplies ordered?: NA  Functional Questionnaire: Do you need assistance with bathing/showering or dressing?: No Do you need assistance with meal preparation?: No Do you need assistance with eating?: No Do you have difficulty maintaining continence: No Do you need assistance with getting out of bed/getting out of a chair/moving?: No Do you have difficulty managing or taking your medications?: No  Folllow up appointments reviewed: PCP Follow-up appointment confirmed?: No MD Provider Line Number:[phone] Given: Yes Follow-up Provider: Dr Ronnald Ramp [facility] Follow-up appointment confirmed?: NA Do you understand care options if your condition(s) worsen?: Yes-patient verbalized understanding    [place] LPN [place] [phone]

## 2022-11-26 ENCOUNTER — Ambulatory Visit: Payer: Medicare PPO | Admitting: Registered"

## 2022-12-08 ENCOUNTER — Other Ambulatory Visit: Payer: Self-pay | Admitting: Nurse Practitioner

## 2022-12-08 ENCOUNTER — Telehealth: Payer: Self-pay | Admitting: Internal Medicine

## 2022-12-08 ENCOUNTER — Other Ambulatory Visit: Payer: Self-pay | Admitting: Internal Medicine

## 2022-12-08 DIAGNOSIS — K219 Gastro-esophageal reflux disease without esophagitis: Secondary | ICD-10-CM

## 2022-12-08 DIAGNOSIS — E785 Hyperlipidemia, unspecified: Secondary | ICD-10-CM

## 2022-12-08 DIAGNOSIS — I10 Essential (primary) hypertension: Secondary | ICD-10-CM

## 2022-12-08 MED ORDER — OMEPRAZOLE 40 MG PO CPDR: 40.0000 mg | DELAYED_RELEASE_CAPSULE | Freq: Every day | ORAL | 1 refills | Status: DC

## 2022-12-08 MED ORDER — ATORVASTATIN CALCIUM 20 MG PO TABS: 20.0000 mg | ORAL_TABLET | Freq: Every day | ORAL | 1 refills | Status: DC

## 2022-12-08 MED ORDER — IRBESARTAN 300 MG PO TABS: 300.0000 mg | ORAL_TABLET | Freq: Every day | ORAL | 1 refills | Status: DC

## 2022-12-08 NOTE — Telephone Encounter (Signed)
Patient needs a refill on atorvastatin '20mg'$ , irbesartan '300mg'$ , and omeprazole '40mg'$ , all sent to the [facility] on [facility].

## 2022-12-26 ENCOUNTER — Telehealth: Payer: Self-pay | Admitting: Internal Medicine

## 2022-12-26 NOTE — Telephone Encounter (Signed)
Please advise as this is not listed on pt's med list.

## 2022-12-26 NOTE — Telephone Encounter (Signed)
Prescription Request  12/26/2022  LOV: 10/27/2022  What is the name of the medication or equipment? Glucose test strips for Freestyle glucose monitor  Have you contacted your pharmacy to request a refill? No   Which pharmacy would you like this sent to?  [facility] (New Address) - [place], [place] AT Previously: [address], [address] [place] [address] Phone: [phone] Fax: [phone]   Patient notified that their request is being sent to the clinical staff for review and that they should receive a response within 2 business days.   Please advise at [facility] [phone]

## 2022-12-27 ENCOUNTER — Other Ambulatory Visit: Payer: Self-pay | Admitting: Internal Medicine

## 2022-12-27 DIAGNOSIS — E119 Type 2 diabetes mellitus without complications: Secondary | ICD-10-CM

## 2022-12-27 MED ORDER — FREESTYLE LIBRE 3 READER DEVI: 1.0000 | Freq: Every day | 3 refills | Status: DC

## 2022-12-27 MED ORDER — FREESTYLE LIBRE 3 SENSOR MISC: 1.0000 | Freq: Every day | 5 refills | Status: DC

## 2022-12-29 ENCOUNTER — Ambulatory Visit: Payer: Medicare PPO | Admitting: Registered"

## 2023-01-19 LAB — HM DIABETES EYE EXAM

## 2023-01-27 ENCOUNTER — Ambulatory Visit: Payer: Medicare PPO | Admitting: Internal Medicine

## 2023-01-30 ENCOUNTER — Encounter: Payer: Self-pay | Admitting: Internal Medicine

## 2023-02-02 ENCOUNTER — Telehealth: Payer: Self-pay | Admitting: Internal Medicine

## 2023-02-02 NOTE — Telephone Encounter (Signed)
Contacted Harrington Challenger Goeser to schedule their annual wellness visit. Appointment made for 02/05/2023.  [facility] Care Guide [facility] AWV TEAM Direct Dial: [phone]

## 2023-02-05 ENCOUNTER — Ambulatory Visit (INDEPENDENT_AMBULATORY_CARE_PROVIDER_SITE_OTHER): Payer: Medicare PPO

## 2023-02-05 VITALS — Ht 64.0 in | Wt 212.0 lb

## 2023-02-05 DIAGNOSIS — Z0000 Encounter for general adult medical examination without abnormal findings: Secondary | ICD-10-CM | POA: Diagnosis not present

## 2023-02-05 NOTE — Patient Instructions (Signed)
Abigail Collins , Thank you for taking time to come for your Medicare Wellness Visit. I appreciate your ongoing commitment to your health goals. Please review the following plan we discussed and let me know if I can assist you in the future.   These are the goals we discussed:  Goals      My goal is to lose 50 pounds by taking Ozempic.     Patient stated     Lose weight and lower blood sugar        This is a list of the screening recommended for you and due dates:  Health Maintenance  Topic Date Due   Zoster (Shingles) Vaccine (1 of 2) Never done   COVID-19 Vaccine (3 - Pfizer risk series) 02/07/2020   Mammogram  04/11/2024*   Hemoglobin A1C  04/27/2023   Flu Shot  05/14/2023   Yearly kidney health urinalysis for diabetes  10/28/2023   Complete foot exam   10/28/2023   Yearly kidney function blood test for diabetes  11/23/2023   Eye exam for diabetics  01/19/2024   Medicare Annual Wellness Visit  02/05/2024   Colon Cancer Screening  07/10/2025   DTaP/Tdap/Td vaccine (2 - Td or Tdap) 10/30/2029   Pneumonia Vaccine  Completed   DEXA scan (bone density measurement)  Completed   Hepatitis C Screening: USPSTF Recommendation to screen - Ages 68-79 yo.  Completed   HPV Vaccine  Aged Out  *Topic was postponed. The date shown is not the original due date.    Advanced directives: Discussed with patient  Conditions/risks identified:  Keep up the good work  Next appointment: Follow up in one year for your annual wellness visit-02/08/24   Preventive Care 65 Years and Older, Female Preventive care refers to lifestyle choices and visits with your health care provider that can promote health and wellness. What does preventive care include? A yearly physical exam. This is also called an annual well check. Dental exams once or twice a year. Routine eye exams. Ask your health care provider how often you should have your eyes checked. Personal lifestyle choices, including: Daily care of your  teeth and gums. Regular physical activity. Eating a healthy diet. Avoiding tobacco and drug use. Limiting alcohol use. Practicing safe sex. Taking low-dose aspirin every day. Taking vitamin and mineral supplements as recommended by your health care provider. What happens during an annual well check? The services and screenings done by your health care provider during your annual well check will depend on your age, overall health, lifestyle risk factors, and family history of disease. Counseling  Your health care provider may ask you questions about your: Alcohol use. Tobacco use. Drug use. Emotional well-being. Home and relationship well-being. Sexual activity. Eating habits. History of falls. Memory and ability to understand (cognition). Work and work Astronomer. Reproductive health. Screening  You may have the following tests or measurements: Height, weight, and BMI. Blood pressure. Lipid and cholesterol levels. These may be checked every 5 years, or more frequently if you are over 89 years old. Skin check. Lung cancer screening. You may have this screening every year starting at age 88 if you have a 30-pack-year history of smoking and currently smoke or have quit within the past 15 years. Fecal occult blood test (FOBT) of the stool. You may have this test every year starting at age 93. Flexible sigmoidoscopy or colonoscopy. You may have a sigmoidoscopy every 5 years or a colonoscopy every 10 years starting at age 25. Hepatitis C  blood test. Hepatitis B blood test. Sexually transmitted disease (STD) testing. Diabetes screening. This is done by checking your blood sugar (glucose) after you have not eaten for a while (fasting). You Reichardt have this done every 1-3 years. Bone density scan. This is done to screen for osteoporosis. You Roen have this done starting at age 44. Mammogram. This Grenz be done every 1-2 years. Talk to your health care provider about how often you should have  regular mammograms. Talk with your health care provider about your test results, treatment options, and if necessary, the need for more tests. Vaccines  Your health care provider [place] recommend certain vaccines, such as: Influenza vaccine. This is recommended every year. Tetanus, diphtheria, and acellular pertussis (Tdap, Td) vaccine. You [place] need a Td booster every 10 years. Zoster vaccine. You Pippins need this after age 50. Pneumococcal 13-valent conjugate (PCV13) vaccine. One dose is recommended after age 22. Pneumococcal polysaccharide (PPSV23) vaccine. One dose is recommended after age 24. Talk to your health care provider about which screenings and vaccines you need and how often you need them. This information is not intended to replace advice given to you by your health care provider. Make sure you discuss any questions you have with your health care provider. Document Released: 10/26/2015 Document Revised: 06/18/2016 Document Reviewed: 07/31/2015 Elsevier Interactive Patient Education  2017 [place] Prevention in the Home Falls can cause injuries. They can happen to people of all ages. There are many things you can do to make your home safe and to help prevent falls. What can I do on the outside of my home? Regularly fix the edges of walkways and driveways and fix any cracks. Remove anything that might make you trip as you walk through a door, such as a raised step or threshold. Trim any bushes or trees on the path to your home. Use bright outdoor lighting. Clear any walking paths of anything that might make someone trip, such as rocks or tools. Regularly check to see if handrails are loose or broken. Make sure that both sides of any steps have handrails. Any raised decks and porches should have guardrails on the edges. Have any leaves, snow, or ice cleared regularly. Use sand or salt on walking paths during winter. Clean up any spills in your garage right away. This  includes oil or grease spills. What can I do in the bathroom? Use night lights. Install grab bars by the toilet and in the tub and shower. Do not use towel bars as grab bars. Use non-skid mats or decals in the tub or shower. If you need to sit down in the shower, use a plastic, non-slip stool. Keep the floor dry. Clean up any water that spills on the floor as soon as it happens. Remove soap buildup in the tub or shower regularly. Attach bath mats securely with double-sided non-slip rug tape. Do not have throw rugs and other things on the floor that can make you trip. What can I do in the bedroom? Use night lights. Make sure that you have a light by your bed that is easy to reach. Do not use any sheets or blankets that are too big for your bed. They should not hang down onto the floor. Have a firm chair that has side arms. You can use this for support while you get dressed. Do not have throw rugs and other things on the floor that can make you trip. What can I do in the kitchen?  Clean up any spills right away. Avoid walking on wet floors. Keep items that you use a lot in easy-to-reach places. If you need to reach something above you, use a strong step stool that has a grab bar. Keep electrical cords out of the way. Do not use floor polish or wax that makes floors slippery. If you must use wax, use non-skid floor wax. Do not have throw rugs and other things on the floor that can make you trip. What can I do with my stairs? Do not leave any items on the stairs. Make sure that there are handrails on both sides of the stairs and use them. Fix handrails that are broken or loose. Make sure that handrails are as long as the stairways. Check any carpeting to make sure that it is firmly attached to the stairs. Fix any carpet that is loose or worn. Avoid having throw rugs at the top or bottom of the stairs. If you do have throw rugs, attach them to the floor with carpet tape. Make sure that you  have a light switch at the top of the stairs and the bottom of the stairs. If you do not have them, ask someone to add them for you. What else can I do to help prevent falls? Wear shoes that: Do not have high heels. Have rubber bottoms. Are comfortable and fit you well. Are closed at the toe. Do not wear sandals. If you use a stepladder: Make sure that it is fully opened. Do not climb a closed stepladder. Make sure that both sides of the stepladder are locked into place. Ask someone to hold it for you, if possible. Clearly mark and make sure that you can see: Any grab bars or handrails. First and last steps. Where the edge of each step is. Use tools that help you move around (mobility aids) if they are needed. These include: Canes. Walkers. Scooters. Crutches. Turn on the lights when you go into a dark area. Replace any light bulbs as soon as they burn out. Set up your furniture so you have a clear path. Avoid moving your furniture around. If any of your floors are uneven, fix them. If there are any pets around you, be aware of where they are. Review your medicines with your doctor. Some medicines can make you feel dizzy. This can increase your chance of falling. Ask your doctor what other things that you can do to help prevent falls. This information is not intended to replace advice given to you by your health care provider. Make sure you discuss any questions you have with your health care provider. Document Released: 07/26/2009 Document Revised: 03/06/2016 Document Reviewed: 11/03/2014 Elsevier Interactive Patient Education  2017 [employer].

## 2023-02-05 NOTE — Progress Notes (Signed)
Subjective:   Abigail Collins is a 72 y.o. female who presents for Medicare Annual (Subsequent) preventive examination.   I connected with  Abigail Collins on 02/05/23 by a audio enabled telemedicine application and verified that I am speaking with the correct person using two identifiers.  Patient Location: Home  Provider Location: Home Office  I discussed the limitations of evaluation and management by telemedicine. The patient expressed understanding and agreed to proceed.     Review of Systems     Cardiac Risk Factors include: advanced age (>55men, >3 women);diabetes mellitus;dyslipidemia;hypertension;obesity (BMI >30kg/m2)     Objective:    Today's Vitals   02/05/23 1503  Weight: 212 lb (96.2 kg)  Height: 5\' 4"  (1.626 m)   Body mass index is 36.39 kg/m.     02/05/2023    3:13 PM 11/21/2022   12:53 PM 03/07/2022    2:04 PM 08/29/2020   12:35 PM 08/02/2018    8:27 PM 08/02/2018    5:59 AM 07/26/2018    2:22 PM  Advanced Directives  Does Patient Have a Medical Advance Directive? No No No No No No No  Would patient like information on creating a medical advance directive? No - Patient declined No - Patient declined No - Patient declined Yes (MAU/Ambulatory/Procedural Areas - Information given) No - Patient declined No - Patient declined Yes (MAU/Ambulatory/Procedural Areas - Information given)    Current Medications (verified) Outpatient Encounter Medications as of 02/05/2023  Medication Sig   atorvastatin (LIPITOR) 20 MG tablet Take 1 tablet (20 mg total) by mouth daily.   Continuous Blood Gluc Receiver (FREESTYLE LIBRE 3 READER) DEVI 1 Act by Does not apply route daily.   Continuous Blood Gluc Sensor (FREESTYLE LIBRE 3 SENSOR) MISC 1 Act by Does not apply route daily. Place 1 sensor on the skin every 14 days. Use to check glucose continuously   glimepiride (AMARYL) 4 MG tablet Take 1 tablet (4 mg total) by mouth every morning.   irbesartan (AVAPRO) 300 MG  tablet Take 1 tablet (300 mg total) by mouth daily.   Lancets (ONETOUCH ULTRASOFT) lancets 1 each by Other route 3 (three) times daily. Use as instructed   metFORMIN (GLUCOPHAGE) 1000 MG tablet Take 1 tablet (1,000 mg total) by mouth 2 (two) times daily with a meal.   omeprazole (PRILOSEC) 40 MG capsule TAKE 1 CAPSULE(40 MG) BY MOUTH DAILY   SURE COMFORT PEN NEEDLES 32G X 4 MM MISC USE 1 PEN NEEDLE ONCE A WEEK   albuterol (PROVENTIL) (2.5 MG/3ML) 0.083% nebulizer solution Inhale 2.5 mg into the lungs every 6 (six) hours as needed for wheezing or shortness of breath. Reported on 01/17/2016   Blood Glucose Monitoring Suppl (ONETOUCH VERIO IQ SYSTEM) w/Device KIT 1 Act by Does not apply route 3 (three) times daily. (Patient not taking: Reported on 02/05/2023)   celecoxib (CELEBREX) 50 MG capsule Take 1 capsule (50 mg total) by mouth 2 (two) times daily. (Patient not taking: Reported on 02/05/2023)   dapagliflozin propanediol (FARXIGA) 10 MG TABS tablet Take 1 tablet (10 mg total) by mouth daily before breakfast. (Patient not taking: Reported on 02/05/2023)   glucose blood (ONETOUCH VERIO) test strip USE TO TEST THREE TIMES DAILY (Patient not taking: Reported on 02/05/2023)   No facility-administered encounter medications on file as of 02/05/2023.    Allergies (verified) Lisinopril, Amlodipine, Tape, Morphine and related, and Percocet [oxycodone-acetaminophen]   History: Past Medical History:  Diagnosis Date   Anemia    hx  of   Anginal pain    "left arm pain, sees Dr. Dione Housekeeper, had card cath 2013"   Arthritis    "all over" (09/14/2012)   Asthma    takes inhaler 2x day   Bronchitis    hx of   Bulging disc    "lower"   Carpal tunnel syndrome of left wrist    Depression    denies   Exertional dyspnea    "sometimes laying down" (09/14/2012)   Headache    frequent headaches,usually if she does not eat   Hyperlipidemia    Hypertension    sees Dr. Nolon Bussing, primary   Pneumonia 03/2012   PONV  (postoperative nausea and vomiting)    Thyroid disease 1960's   "don't have it now" (09/14/2012)   Type II diabetes mellitus    Urinary tract infection    hx of   Vomiting    pt states she vomits every am   Past Surgical History:  Procedure Laterality Date   CARDIAC CATHETERIZATION  05/13/2012   mod luminal irregularity of pLAD, no sign CAD, EF 65%.   CHOLECYSTECTOMY  1970's   KYPHOPLASTY N/A 02/10/2013   Procedure:  LUMBAR TWO KYPHOPLASTY;  Surgeon: Cristi Loron, MD;  Location: [facility];  Service: Neurosurgery;  Laterality: N/A;  L2 Kyphoplasty; Will use Stern's Carm.   ORIF HUMERUS FRACTURE Right 01/06/2018   Procedure: OPEN REDUCTION INTERNAL FIXATION (ORIF) RIGHT PROXIMAL HUMERUS FRACTURE;  Surgeon: Tarry Kos, MD;  Location: [facility];  Service: Orthopedics;  Laterality: Right;   TOTAL ELBOW REPLACEMENT  ~ 2005   "right" (09/14/2012)   TOTAL KNEE ARTHROPLASTY  09/13/2012   Procedure: TOTAL KNEE ARTHROPLASTY;  Surgeon: Dannielle Huh, MD;  Location: [facility];  Service: Orthopedics;  Laterality: Right;   TOTAL KNEE ARTHROPLASTY Left 08/02/2018   Procedure: LEFT TOTAL KNEE ARTHROPLASTY;  Surgeon: Tarry Kos, MD;  Location: [facility];  Service: Orthopedics;  Laterality: Left;   TUBAL LIGATION  1970's   VAGINAL HYSTERECTOMY  1970's   Family History  Problem Relation Age of Onset   Arthritis Mother    Arthritis Father    Hypertension Father    Diabetes Father    Colon cancer Neg Hx    Social History   Socioeconomic History   Marital status: Married    Spouse name: Not on file   Number of children: Not on file   Years of education: Not on file   Highest education level: Not on file  Occupational History   Not on file  Tobacco Use   Smoking status: Former    Packs/day: 0.25    Years: 20.00    Additional pack years: 0.00    Total pack years: 5.00    Types: Cigarettes    Quit date: 10/13/2009    Years since quitting: 13.3    Passive exposure: Past   Smokeless tobacco: Never   Vaping Use   Vaping Use: Never used  Substance and Sexual Activity   Alcohol use: No    Comment: 09/14/2012 "did drink a little in my younger days"   Drug use: No   Sexual activity: Not Currently    Partners: Female  Other Topics Concern   Not on file  Social History Narrative   Not on file   Social Determinants of Health   Financial Resource Strain: Low Risk  (02/05/2023)   Overall Financial Resource Strain (CARDIA)    Difficulty of Paying Living Expenses: Not hard at all  Food Insecurity: No Food Insecurity (02/05/2023)   Hunger Vital Sign    Worried About Running Out of Food in the Last Year: Never true    Ran Out of Food in the Last Year: Never true  Transportation Needs: No Transportation Needs (02/05/2023)   PRAPARE - Administrator, Civil Service (Medical): No    Lack of Transportation (Non-Medical): No  Physical Activity: Inactive (02/05/2023)   Exercise Vital Sign    Days of Exercise per Week: 0 days    Minutes of Exercise per Session: 0 min  Stress: No Stress Concern Present (02/05/2023)   [employer] of Occupational Health - Occupational Stress Questionnaire    Feeling of Stress : Not at all  Social Connections: Moderately Isolated (02/05/2023)   Social Connection and Isolation Panel [NHANES]    Frequency of Communication with Friends and Family: More than three times a week    Frequency of Social Gatherings with Friends and Family: Once a week    Attends Religious Services: Never    Database administrator or Organizations: No    Attends Engineer, structural: Never    Marital Status: Married    Tobacco Counseling Counseling given: Not Answered   Clinical Intake:  Pre-visit preparation completed: Yes  Pain : No/denies pain     BMI - recorded: 36.39 Nutritional Status: BMI > 30  Obese Nutritional Risks: None Diabetes: Yes CBG done?: No Did pt. bring in CBG monitor from home?: No  How often do you need to have someone help you  when you read instructions, pamphlets, or other written materials from your doctor or pharmacy?: 1 - Never  Diabetic?   Yes  Interpreter Needed?: No  Information entered by :: Kandis Cocking, CMA   Activities of Daily Living    02/05/2023    3:15 PM 11/21/2022    8:25 PM  In your present state of health, do you have any difficulty performing the following activities:  Hearing? 0 0  Vision? 0 0  Difficulty concentrating or making decisions? 0 0  Walking or climbing stairs? 0 1  Dressing or bathing? 0 0  Doing errands, shopping? 0 0  Preparing Food and eating ? N   Using the Toilet? N   In the past six months, have you accidently leaked urine? Y   Do you have problems with loss of bowel control? N   Managing your Medications? N   Managing your Finances? N   Housekeeping or managing your Housekeeping? N     Patient Care Team: [name], MD as PCP - General (Internal Medicine) Kathyrn Sheriff, [facility] as Pharmacist (Pharmacist) Janalyn Harder, MD (Inactive) as Consulting Physician (Dermatology)  Indicate any recent Medical Services you may have received from other than [facility] providers in the past year (date may be approximate).     Assessment:   This is a routine wellness examination for Danaya.  Hearing/Vision screen Hearing Screening - Comments:: Per patient does not hear very well out of left ear,   right ear is okay Vision Screening - Comments:: Wears rx glasses - up to date with routine eye exams with    Dietary issues and exercise activities discussed: Current Exercise Habits: The patient does not participate in regular exercise at present   Goals Addressed             This Visit's Progress    Patient stated       Lose weight and lower blood sugar  Depression Screen    02/05/2023    3:23 PM 03/07/2022    2:09 PM 01/24/2021   11:10 AM 08/29/2020   12:00 PM 10/31/2019    2:18 PM 04/12/2019    1:08 PM 05/09/2018    5:53 PM  PHQ 2/9 Scores  PHQ  - 2 Score 2 0 0 0 0 0 0  PHQ- 9 Score 2          Fall Risk    02/05/2023    3:06 PM 03/07/2022    2:05 PM 09/27/2021   10:30 AM 01/24/2021   11:10 AM 10/31/2019    2:18 PM  Fall Risk   Falls in the past year? 0 0 0 0 0  Number falls in past yr: 0 0 0 0 0  Injury with Fall? 0 0 0 0 0  Comment   N/A- no falls reported    Risk for fall due to : No Fall Risks No Fall Risks History of fall(s);Impaired balance/gait    Follow up Falls prevention discussed Falls evaluation completed Falls prevention discussed  Falls evaluation completed    FALL RISK PREVENTION PERTAINING TO THE HOME:  Any stairs in or around the home? Yes   If so, are there any without handrails? Yes   Home free of loose throw rugs in walkways, pet beds, electrical cords, etc? No   Adequate lighting in your home to reduce risk of falls? Yes   ASSISTIVE DEVICES UTILIZED TO PREVENT FALLS:  Life alert? No  Use of a cane, walker or w/c? Yes  Grab bars in the bathroom? Yes  Shower chair or bench in shower? No Elevated toilet seat or a handicapped toilet? Yes   TIMED UP AND GO:  Was the test performed? No .   televisit  Cognitive Function:        02/05/2023    3:20 PM 03/07/2022    2:20 PM  6CIT Screen  What Year? 0 points 0 points  What month? 0 points 0 points  What time? 0 points 0 points  Count back from 20 0 points 0 points  Months in reverse 0 points 0 points  Repeat phrase 0 points 0 points  Total Score 0 points 0 points    Immunizations Immunization History  Administered Date(s) Administered   Fluad Quad(high Dose 65+) 07/08/2019, 08/27/2021   Influenza Split 08/05/2012   Influenza, High Dose Seasonal PF 07/02/2017, 07/02/2018, 07/22/2022   Influenza,inj,Quad PF,6+ Mos 09/03/2015, 07/29/2016   PFIZER(Purple Top)SARS-COV-2 Vaccination 12/11/2019, 01/10/2020   Pneumococcal Conjugate-13 10/31/2019   Pneumococcal Polysaccharide-23 04/06/2012, 08/05/2018   Tdap 10/31/2019    TDAP status: Up to  date  Flu Vaccine status: Up to date  Pneumococcal vaccine status: Up to date  Covid-19 vaccine status: Declined, Education has been provided regarding the importance of this vaccine but patient still declined. Advised may receive this vaccine at local pharmacy or Health Dept.or vaccine clinic. Aware to provide a copy of the vaccination record if obtained from local pharmacy or Health Dept. Verbalized acceptance and understanding.  Qualifies for Shingles Vaccine? Yes   Zostavax completed No   Shingrix Completed?: No.    Education has been provided regarding the importance of this vaccine. Patient has been advised to call insurance company to determine out of pocket expense if they have not yet received this vaccine. Advised may also receive vaccine at local pharmacy or Health Dept. Verbalized acceptance and understanding.  Screening Tests Health Maintenance  Topic Date Due   Zoster  Vaccines- Shingrix (1 of 2) Never done   COVID-19 Vaccine (3 - Pfizer risk series) 02/07/2020   MAMMOGRAM  04/11/2024 (Originally 02/26/2002)   HEMOGLOBIN A1C  04/27/2023   INFLUENZA VACCINE  05/14/2023   Diabetic kidney evaluation - Urine ACR  10/28/2023   FOOT EXAM  10/28/2023   Diabetic kidney evaluation - eGFR measurement  11/23/2023   OPHTHALMOLOGY EXAM  01/19/2024   Medicare Annual Wellness (AWV)  02/05/2024   COLONOSCOPY (Pts 45-2yrs Insurance coverage will need to be confirmed)  07/10/2025   DTaP/Tdap/Td (2 - Td or Tdap) 10/30/2029   Pneumonia Vaccine 50+ Years old  Completed   DEXA SCAN  Completed   Hepatitis C Screening  Completed   HPV VACCINES  Aged Out    Health Maintenance  Health Maintenance Due  Topic Date Due   Zoster Vaccines- Shingrix (1 of 2) Never done   COVID-19 Vaccine (3 - Pfizer risk series) 02/07/2020    Colorectal cancer screening: Type of screening: Colonoscopy. Completed 07/09/2015   . Repeat every 10   years  Mammogram status: Completed  . Repeat every  year    Lung Cancer Screening: (Low Dose CT Chest recommended if Age 68-80 years, 30 pack-year currently smoking OR have quit w/in 15years.) does not qualify.   Lung Cancer Screening Referral: N/A  Additional Screening:  Hepatitis C Screening: does qualify; Completed 04/12/2019  Vision Screening: Recommended annual ophthalmology exams for early detection of glaucoma and other disorders of the eye. Is the patient up to date with their annual eye exam?  Yes   Who is the provider or what is the name of the office in which the patient attends annual eye exams?-Unknown  If pt is not established with a provider, would they like to be referred to a provider to establish care? No .   Dental Screening: Recommended annual dental exams for proper oral hygiene  Community Resource Referral / Chronic Care Management: CRR required this visit?  No   CCM required this visit?  No      Plan:     I have personally reviewed and noted the following in the patient's chart:   Medical and social history Use of alcohol, tobacco or illicit drugs  Current medications and supplements including opioid prescriptions. Patient is not currently taking opioid prescriptions. Functional ability and status Nutritional status Physical activity Advanced directives List of other physicians Hospitalizations, surgeries, and ER visits in previous 12 months Vitals Screenings to include cognitive, depression, and falls Referrals and appointments  In addition, I have reviewed and discussed with patient certain preventive protocols, quality metrics, and best practice recommendations. A written personalized care plan for preventive services as well as general preventive health recommendations were provided to patient.     Milus Mallick, CMA   02/05/2023   Nurse Notes: Per patient will contact insurance to discuss out of pocket cost for DEXA scan

## 2023-02-09 ENCOUNTER — Ambulatory Visit: Payer: Medicare PPO | Admitting: Registered"

## 2023-02-09 ENCOUNTER — Encounter: Payer: Self-pay | Admitting: Internal Medicine

## 2023-02-09 ENCOUNTER — Ambulatory Visit (INDEPENDENT_AMBULATORY_CARE_PROVIDER_SITE_OTHER): Payer: Medicare PPO | Admitting: Internal Medicine

## 2023-02-09 VITALS — BP 136/82 | HR 81 | Temp 98.0°F | Ht 64.0 in | Wt 216.0 lb

## 2023-02-09 DIAGNOSIS — E785 Hyperlipidemia, unspecified: Secondary | ICD-10-CM

## 2023-02-09 DIAGNOSIS — E119 Type 2 diabetes mellitus without complications: Secondary | ICD-10-CM | POA: Diagnosis not present

## 2023-02-09 DIAGNOSIS — Z23 Encounter for immunization: Secondary | ICD-10-CM

## 2023-02-09 DIAGNOSIS — Z794 Long term (current) use of insulin: Secondary | ICD-10-CM | POA: Diagnosis not present

## 2023-02-09 DIAGNOSIS — E118 Type 2 diabetes mellitus with unspecified complications: Secondary | ICD-10-CM

## 2023-02-09 LAB — HEMOGLOBIN A1C: Hgb A1c MFr Bld: 8.5 % — ABNORMAL HIGH (ref 4.6–6.5)

## 2023-02-09 MED ORDER — SURE COMFORT PEN NEEDLES 32G X 4 MM MISC: 1.0000 | Freq: Two times a day (BID) | 1 refills | Status: DC

## 2023-02-09 MED ORDER — TOUJEO MAX SOLOSTAR 300 UNIT/ML ~~LOC~~ SOPN: 20.0000 [IU] | PEN_INJECTOR | Freq: Every day | SUBCUTANEOUS | 1 refills | Status: DC

## 2023-02-09 MED ORDER — OZEMPIC (0.25 OR 0.5 MG/DOSE) 2 MG/3ML ~~LOC~~ SOPN: 0.2500 mg | PEN_INJECTOR | SUBCUTANEOUS | 0 refills | Status: DC

## 2023-02-09 NOTE — Progress Notes (Unsigned)
Subjective:  Patient ID: Abigail Collins, female    DOB: Mar 16, 1951  Age: 72 y.o. MRN: [id number]  CC: Hypertension and Diabetes   HPI Abigail Collins presents for f/up ---  She had an episode of hypoglycemia 3 days ago (56) so she wants to stop taking the SU. She is active and denies DOE, CP, SOB, edema.  Outpatient Medications Prior to Visit  Medication Sig Dispense Refill   atorvastatin (LIPITOR) 20 MG tablet Take 1 tablet (20 mg total) by mouth daily. 90 tablet 1   celecoxib (CELEBREX) 50 MG capsule Take 1 capsule (50 mg total) by mouth 2 (two) times daily. 180 capsule 1   Continuous Blood Gluc Receiver (FREESTYLE LIBRE 3 READER) DEVI 1 Act by Does not apply route daily. 1 each 3   Continuous Blood Gluc Sensor (FREESTYLE LIBRE 3 SENSOR) MISC 1 Act by Does not apply route daily. Place 1 sensor on the skin every 14 days. Use to check glucose continuously 2 each 5   dapagliflozin propanediol (FARXIGA) 10 MG TABS tablet Take 1 tablet (10 mg total) by mouth daily before breakfast. 30 tablet 2   irbesartan (AVAPRO) 300 MG tablet Take 1 tablet (300 mg total) by mouth daily. 90 tablet 1   Lancets (ONETOUCH ULTRASOFT) lancets 1 each by Other route 3 (three) times daily. Use as instructed 300 each 1   metFORMIN (GLUCOPHAGE) 1000 MG tablet Take 1 tablet (1,000 mg total) by mouth 2 (two) times daily with a meal. 180 tablet 3   omeprazole (PRILOSEC) 40 MG capsule TAKE 1 CAPSULE(40 MG) BY MOUTH DAILY 90 capsule 0   albuterol (PROVENTIL) (2.5 MG/3ML) 0.083% nebulizer solution Inhale 2.5 mg into the lungs every 6 (six) hours as needed for wheezing or shortness of breath. Reported on 01/17/2016     Blood Glucose Monitoring Suppl (ONETOUCH VERIO IQ SYSTEM) w/Device KIT 1 Act by Does not apply route 3 (three) times daily. 2 kit 1   glimepiride (AMARYL) 4 MG tablet Take 1 tablet (4 mg total) by mouth every morning. 30 tablet 4   glucose blood (ONETOUCH VERIO) test strip USE TO TEST THREE TIMES DAILY 200  strip 2   SURE COMFORT PEN NEEDLES 32G X 4 MM MISC USE 1 PEN NEEDLE ONCE A WEEK 100 each 10   No facility-administered medications prior to visit.    ROS Review of Systems  Constitutional: Negative.  Negative for chills, diaphoresis, fatigue and unexpected weight change.  HENT: Negative.    Eyes: Negative.   Respiratory:  Negative for cough, chest tightness, shortness of breath and wheezing.   Cardiovascular:  Negative for chest pain, palpitations and leg swelling.  Gastrointestinal:  Negative for abdominal pain, constipation, diarrhea, nausea and vomiting.  Endocrine: Negative.   Genitourinary: Negative.  Negative for difficulty urinating.  Musculoskeletal:  Positive for arthralgias and gait problem.  Skin: Negative.   Neurological:  Negative for dizziness, weakness, light-headedness and headaches.  Hematological:  Negative for adenopathy. Does not bruise/bleed easily.  Psychiatric/Behavioral: Negative.      Objective:  BP 136/82 (BP Location: Left Arm, Patient Position: Sitting, Cuff Size: Large)   Pulse 81   Temp 98 F (36.7 C) (Oral)   Ht 5\' 4"  (1.626 m)   Wt 216 lb (98 kg)   SpO2 98%   BMI 37.08 kg/m   BP Readings from Last 3 Encounters:  02/09/23 136/82  11/22/22 119/74  10/27/22 138/86    Wt Readings from Last 3 Encounters:  02/09/23 216  lb (98 kg)  02/05/23 212 lb (96.2 kg)  11/21/22 212 lb 1.3 oz (96.2 kg)    Physical Exam Vitals reviewed.  Constitutional:      Appearance: Normal appearance. She is obese.  HENT:     Mouth/Throat:     Mouth: Mucous membranes are moist.  Eyes:     General: No scleral icterus.    Conjunctiva/sclera: Conjunctivae normal.  Cardiovascular:     Rate and Rhythm: Normal rate and regular rhythm.     Heart sounds: No murmur heard. Pulmonary:     Effort: Pulmonary effort is normal.     Breath sounds: No stridor. No wheezing, rhonchi or rales.  Abdominal:     General: Abdomen is protuberant. Bowel sounds are normal. There is  no distension.     Palpations: Abdomen is soft. There is no hepatomegaly, splenomegaly or mass.     Tenderness: There is no abdominal tenderness. There is no guarding.  Musculoskeletal:        General: Normal range of motion.     Cervical back: Neck supple.     Right lower leg: No edema.     Left lower leg: No edema.  Lymphadenopathy:     Cervical: No cervical adenopathy.  Skin:    General: Skin is warm and dry.  Neurological:     General: No focal deficit present.     Mental Status: She is alert. Mental status is at baseline.  Psychiatric:        Mood and Affect: Mood normal.        Behavior: Behavior normal.     Lab Results  Component Value Date   WBC 7.2 11/21/2022   HGB 12.9 11/21/2022   HCT 42.7 11/21/2022   PLT 156 11/21/2022   GLUCOSE 185 (H) 11/22/2022   CHOL 145 10/27/2022   TRIG 327.0 (H) 10/27/2022   HDL 58.30 10/27/2022   LDLDIRECT 44.0 10/27/2022   LDLCALC 15 01/14/2022   ALT 18 11/21/2022   AST 21 11/21/2022   NA 138 11/22/2022   K 4.1 11/22/2022   CL 106 11/22/2022   CREATININE 0.97 11/22/2022   BUN 19 11/22/2022   CO2 22 11/22/2022   TSH 2.01 10/27/2022   INR 0.91 07/26/2018   HGBA1C 8.5 (H) 02/09/2023   MICROALBUR <0.7 10/27/2022    No results found.  Assessment & Plan:    Insulin-requiring or dependent type II diabetes mellitus (HCC)- Will d'c the SU, start a basal insulin and GLP-1 agonist. -     Ozempic (0.25 or 0.5 MG/DOSE); Inject 0.25 mg into the skin once a week.  Dispense: 3 mL; Refill: 0 -     Hemoglobin A1c; Future -     Toujeo Max SoloStar; Inject 20 Units into the skin daily.  Dispense: 6 mL; Refill: 1 -     Sure Comfort Pen Needles; Inject 1 Act into the skin 2 (two) times daily.  Dispense: 100 each; Refill: 1 -     CT CARDIAC SCORING (SELF PAY ONLY); Future -     Gvoke HypoPen 2-Pack; Inject 1 Act into the skin daily as needed.  Dispense: 2 mL; Refill: 5  Hyperlipidemia with target LDL less than 100 - LDL goal achieved. Doing  well on the statin  -     CT CARDIAC SCORING (SELF PAY ONLY); Future  Need for shingles vaccine -     Shingrix; Inject 0.5 mLs into the muscle once for 1 dose.  Dispense: 0.5 mL; Refill: 1  Follow-up: Return in about 4 months (around 06/11/2023).  [name], MD

## 2023-02-09 NOTE — Patient Instructions (Signed)

## 2023-02-09 NOTE — Progress Notes (Deleted)
Pt has visit with PCP Dr. Yetta Barre this morning with A1c lab ordered  Lab Results  Component Value Date   HGBA1C 10.0 (H) 10/27/2022   Libre 3 CGM  Medications: Metformin 1000 bid, Ozempic, Farxiga  Medical History: T2D, osteoporosis, osteoarthritis in knees, GERD, COPD, HTN, Vit D deficiency

## 2023-02-10 MED ORDER — SHINGRIX 50 MCG/0.5ML IM SUSR: 0.5000 mL | Freq: Once | INTRAMUSCULAR | 1 refills | Status: AC

## 2023-02-10 MED ORDER — GVOKE HYPOPEN 2-PACK 1 MG/0.2ML ~~LOC~~ SOAJ: 1.0000 | Freq: Every day | SUBCUTANEOUS | 5 refills | Status: AC | PRN

## 2023-02-23 ENCOUNTER — Telehealth: Payer: Self-pay | Admitting: Internal Medicine

## 2023-02-23 DIAGNOSIS — E119 Type 2 diabetes mellitus without complications: Secondary | ICD-10-CM

## 2023-02-23 MED ORDER — OZEMPIC (0.25 OR 0.5 MG/DOSE) 2 MG/3ML ~~LOC~~ SOPN: 0.2500 mg | PEN_INJECTOR | SUBCUTANEOUS | 0 refills | Status: DC

## 2023-02-23 NOTE — Telephone Encounter (Signed)
Patient called and said the pharmacy never received the prescription for Semaglutide,0.25 or 0.5MG /DOS, (OZEMPIC, 0.25 OR 0.5 MG/DOSE,) 2 MG/3ML SOPN . She would like for it to be sent [facility]. Best callback is [phone].

## 2023-03-04 ENCOUNTER — Ambulatory Visit: Payer: Medicare PPO | Admitting: Family Medicine

## 2023-03-05 ENCOUNTER — Telehealth: Payer: Self-pay | Admitting: Internal Medicine

## 2023-03-05 NOTE — Telephone Encounter (Signed)
Patient called and said she had one of the eye exams done at this office on 02/05/2023. She was told her results would be mailed to her and she hasn't received them. She would like to know if they can be mailed to the address on file. Best callback is [phone].

## 2023-03-13 NOTE — Telephone Encounter (Signed)
Left patient message, okay per DPR, that the Retinal Scan results were not mailed to her previously but that I will get a copy  mailed to her for her records today. To call us back if she has additional questions.

## 2023-04-13 ENCOUNTER — Telehealth: Payer: Self-pay | Admitting: Internal Medicine

## 2023-04-13 NOTE — Telephone Encounter (Signed)
Tania from [facility] called and wanted to know if Abigail Collins had received her email about the DexCom rep reaching out to her. Best callback is [phone].

## 2023-04-13 NOTE — Telephone Encounter (Signed)
LVM informing pt that [facility] has been attempting to contact him in regard to Dexcom. Additional info is needed prior to shipping.   I have made a note that pt is using [employer].

## 2023-04-23 ENCOUNTER — Telehealth: Payer: Self-pay

## 2023-04-23 ENCOUNTER — Ambulatory Visit (INDEPENDENT_AMBULATORY_CARE_PROVIDER_SITE_OTHER): Payer: Medicare PPO | Admitting: Internal Medicine

## 2023-04-23 ENCOUNTER — Encounter: Payer: Self-pay | Admitting: Internal Medicine

## 2023-04-23 VITALS — BP 122/76 | HR 60 | Temp 97.7°F | Ht 64.0 in | Wt 211.0 lb

## 2023-04-23 DIAGNOSIS — Z794 Long term (current) use of insulin: Secondary | ICD-10-CM | POA: Diagnosis not present

## 2023-04-23 DIAGNOSIS — E118 Type 2 diabetes mellitus with unspecified complications: Secondary | ICD-10-CM

## 2023-04-23 DIAGNOSIS — I10 Essential (primary) hypertension: Secondary | ICD-10-CM | POA: Diagnosis not present

## 2023-04-23 DIAGNOSIS — E119 Type 2 diabetes mellitus without complications: Secondary | ICD-10-CM

## 2023-04-23 MED ORDER — INSULIN GLARGINE 100 UNIT/ML SOLOSTAR PEN: 30.0000 [IU] | PEN_INJECTOR | Freq: Every day | SUBCUTANEOUS | 0 refills | Status: DC

## 2023-04-23 MED ORDER — SEMAGLUTIDE (1 MG/DOSE) 4 MG/3ML ~~LOC~~ SOPN: 1.0000 mg | PEN_INJECTOR | SUBCUTANEOUS | 0 refills | Status: DC

## 2023-04-23 MED ORDER — DAPAGLIFLOZIN PROPANEDIOL 10 MG PO TABS: 10.0000 mg | ORAL_TABLET | Freq: Every day | ORAL | 0 refills | Status: DC

## 2023-04-23 NOTE — Progress Notes (Signed)
   04/23/2023  Patient ID: Abigail Collins, female   DOB: 07-Oct-1951, 72 y.o.   MRN: [id number]  Patient outreach to schedule telephone visit for medication review per request from patient's PCP, Dr. Yetta Barre.   Scheduled for 7/25.  [name], PharmD, DPLA

## 2023-04-23 NOTE — Progress Notes (Unsigned)
Subjective:  Patient ID: Abigail Collins, female    DOB: 1950/11/17  Age: 72 y.o. MRN: [id number]  CC: Diabetes   HPI Abigail Collins presents for f/up ----  She is not using toujeo due to the cost and she complains of poly's. She is active and denies DOE, CP, SOB, edema.   Outpatient Medications Prior to Visit  Medication Sig Dispense Refill   atorvastatin (LIPITOR) 20 MG tablet Take 1 tablet (20 mg total) by mouth daily. 90 tablet 1   celecoxib (CELEBREX) 50 MG capsule Take 1 capsule (50 mg total) by mouth 2 (two) times daily. 180 capsule 1   Continuous Blood Gluc Receiver (FREESTYLE LIBRE 3 READER) DEVI 1 Act by Does not apply route daily. 1 each 3   Continuous Blood Gluc Sensor (FREESTYLE LIBRE 3 SENSOR) MISC 1 Act by Does not apply route daily. Place 1 sensor on the skin every 14 days. Use to check glucose continuously 2 each 5   Glucagon (GVOKE HYPOPEN 2-PACK) 1 MG/0.2ML SOAJ Inject 1 Act into the skin daily as needed. 2 mL 5   Insulin Pen Needle (SURE COMFORT PEN NEEDLES) 32G X 4 MM MISC Inject 1 Act into the skin 2 (two) times daily. 100 each 1   irbesartan (AVAPRO) 300 MG tablet Take 1 tablet (300 mg total) by mouth daily. 90 tablet 1   Lancets (ONETOUCH ULTRASOFT) lancets 1 each by Other route 3 (three) times daily. Use as instructed 300 each 1   metFORMIN (GLUCOPHAGE) 1000 MG tablet Take 1 tablet (1,000 mg total) by mouth 2 (two) times daily with a meal. 180 tablet 3   omeprazole (PRILOSEC) 40 MG capsule TAKE 1 CAPSULE(40 MG) BY MOUTH DAILY 90 capsule 0   dapagliflozin propanediol (FARXIGA) 10 MG TABS tablet Take 1 tablet (10 mg total) by mouth daily before breakfast. 30 tablet 2   insulin glargine, 2 Unit Dial, (TOUJEO MAX SOLOSTAR) 300 UNIT/ML Solostar Pen Inject 20 Units into the skin daily. 6 mL 1   Semaglutide,0.25 or 0.5MG /DOS, (OZEMPIC, 0.25 OR 0.5 MG/DOSE,) 2 MG/3ML SOPN Inject 0.25 mg into the skin once a week. 3 mL 0   No facility-administered medications prior  to visit.    ROS Review of Systems  Constitutional: Negative.  Negative for chills, diaphoresis, fatigue and fever.  HENT: Negative.    Eyes: Negative.   Respiratory:  Negative for cough, chest tightness, shortness of breath and wheezing.   Cardiovascular:  Negative for chest pain, palpitations and leg swelling.  Gastrointestinal:  Positive for nausea. Negative for abdominal pain, constipation and diarrhea.  Endocrine: Positive for polydipsia and polyuria. Negative for polyphagia.  Genitourinary: Negative.  Negative for difficulty urinating.  Musculoskeletal:  Positive for arthralgias. Negative for myalgias.  Skin: Negative.   Neurological:  Negative for dizziness and weakness.  Hematological:  Negative for adenopathy. Does not bruise/bleed easily.  Psychiatric/Behavioral: Negative.      Objective:  BP 122/76 (BP Location: Right Arm, Patient Position: Sitting, Cuff Size: Large)   Pulse 60   Temp 97.7 F (36.5 C) (Oral)   Ht 5\' 4"  (1.626 m)   Wt 211 lb (95.7 kg)   SpO2 96%   BMI 36.22 kg/m   BP Readings from Last 3 Encounters:  04/23/23 122/76  02/09/23 136/82  11/22/22 119/74    Wt Readings from Last 3 Encounters:  04/23/23 211 lb (95.7 kg)  02/09/23 216 lb (98 kg)  02/05/23 212 lb (96.2 kg)    Physical Exam Vitals  reviewed.  Constitutional:      Appearance: She is not ill-appearing.  HENT:     Mouth/Throat:     Mouth: Mucous membranes are moist.  Eyes:     General: No scleral icterus.    Conjunctiva/sclera: Conjunctivae normal.  Cardiovascular:     Rate and Rhythm: Normal rate and regular rhythm.     Heart sounds: No murmur heard.    No gallop.  Pulmonary:     Effort: Pulmonary effort is normal.     Breath sounds: No stridor. No wheezing, rhonchi or rales.  Abdominal:     General: Abdomen is protuberant. There is no distension.     Palpations: There is no mass.     Tenderness: There is no abdominal tenderness. There is no guarding.     Hernia: No  hernia is present.  Musculoskeletal:        General: Normal range of motion.     Cervical back: Neck supple.     Right lower leg: No edema.     Left lower leg: No edema.  Lymphadenopathy:     Cervical: No cervical adenopathy.  Skin:    General: Skin is warm and dry.     Findings: No rash.  Neurological:     General: No focal deficit present.     Mental Status: She is alert. Mental status is at baseline.  Psychiatric:        Mood and Affect: Mood normal.        Behavior: Behavior normal.     Lab Results  Component Value Date   WBC 7.2 11/21/2022   HGB 12.9 11/21/2022   HCT 42.7 11/21/2022   PLT 156 11/21/2022   GLUCOSE 185 (H) 11/22/2022   CHOL 145 10/27/2022   TRIG 327.0 (H) 10/27/2022   HDL 58.30 10/27/2022   LDLDIRECT 44.0 10/27/2022   LDLCALC 15 01/14/2022   ALT 18 11/21/2022   AST 21 11/21/2022   NA 138 11/22/2022   K 4.1 11/22/2022   CL 106 11/22/2022   CREATININE 0.97 11/22/2022   BUN 19 11/22/2022   CO2 22 11/22/2022   TSH 2.01 10/27/2022   INR 0.91 07/26/2018   HGBA1C 8.5 (H) 02/09/2023   MICROALBUR <0.7 10/27/2022    No results found.  Assessment & Plan:   HTN (hypertension), benign- Her BP is well controlled. -     Basic metabolic panel; Future  Insulin-requiring or dependent type II diabetes mellitus (HCC)- Will change to a generic basal insulin. -     Basic metabolic panel; Future -     Hemoglobin A1c; Future -     Semaglutide (1 MG/DOSE); Inject 1 mg as directed once a week.  Dispense: 9 mL; Refill: 0 -     Insulin Glargine; Inject 30 Units into the skin daily.  Dispense: 27 mL; Refill: 0  Type II diabetes mellitus with manifestations (HCC) - Will continue the GLP-1 agonist. -     Dapagliflozin Propanediol; Take 1 tablet (10 mg total) by mouth daily before breakfast.  Dispense: 90 tablet; Refill: 0     Follow-up: Return in about 3 months (around 07/24/2023).  [name], MD

## 2023-04-23 NOTE — Patient Instructions (Signed)

## 2023-05-07 ENCOUNTER — Other Ambulatory Visit: Payer: Medicare PPO

## 2023-05-07 NOTE — Progress Notes (Signed)
   05/07/2023  Patient ID: Abigail Collins, female   DOB: Nov 29, 1950, 72 y.o.   MRN: [id number]  Patient outreach for scheduled telephone visit unsuccessful.  Called and left HIPAA compliant voicemail x2 with my direct number to call to reschedule.  Will try to call again next week if I do not hear back.  [name], PharmD, DPLA

## 2023-05-14 ENCOUNTER — Telehealth: Payer: Self-pay

## 2023-05-14 NOTE — Progress Notes (Signed)
   05/14/2023  Patient ID: Abigail Collins, female   DOB: Feb 06, 1951, 72 y.o.   MRN: [id number]  Outreach attempt to reschedule missed telephone visit on 7/25.  I was not able to reach the patient, but I did leave a HIPAA compliant voicemail with my direct phone number.  I will try to call again next week if I do not hear back.  [name], PharmD, DPLA

## 2023-05-15 ENCOUNTER — Telehealth: Payer: Self-pay

## 2023-05-15 NOTE — Progress Notes (Signed)
   05/15/2023  Patient ID: Abigail Collins, female   DOB: March 29, 1951, 72 y.o.   MRN: [id number]  Patient returned my call about rescheduling her missed telephone visit, but I missed her call due to being on another patient visit.  Attempted to contact her to reschedule but had to leave a message.  [name], PharmD, DPLA

## 2023-05-22 ENCOUNTER — Telehealth: Payer: Self-pay

## 2023-05-22 NOTE — Progress Notes (Signed)
   05/22/2023  Patient ID: [name], female   DOB: 1951/03/08, 72 y.o.   MRN: [id number]  Outreach attempt to schedule a telephone visit to review medications with [name].  We previously had an appointment scheduled, but I was not able to reach her.  I have since attempted to reschedule two other times, and have left my direction phone number for her to give me a call.  I am happy to assist if patient reaches back out.  [name], PharmD, DPLA

## 2023-05-25 ENCOUNTER — Telehealth: Payer: Self-pay | Admitting: Internal Medicine

## 2023-05-25 ENCOUNTER — Other Ambulatory Visit: Payer: Self-pay | Admitting: Internal Medicine

## 2023-05-25 DIAGNOSIS — K219 Gastro-esophageal reflux disease without esophagitis: Secondary | ICD-10-CM

## 2023-05-25 DIAGNOSIS — E785 Hyperlipidemia, unspecified: Secondary | ICD-10-CM

## 2023-05-25 DIAGNOSIS — I10 Essential (primary) hypertension: Secondary | ICD-10-CM

## 2023-05-25 MED ORDER — OMEPRAZOLE 40 MG PO CPDR: 40.0000 mg | DELAYED_RELEASE_CAPSULE | Freq: Every day | ORAL | 0 refills | Status: DC

## 2023-05-25 MED ORDER — ATORVASTATIN CALCIUM 20 MG PO TABS: 20.0000 mg | ORAL_TABLET | Freq: Every day | ORAL | 0 refills | Status: DC

## 2023-05-25 MED ORDER — IRBESARTAN 300 MG PO TABS: 300.0000 mg | ORAL_TABLET | Freq: Every day | ORAL | 0 refills | Status: DC

## 2023-05-25 NOTE — Telephone Encounter (Signed)
Prescription Request  05/25/2023  LOV: 04/23/2023  What is the name of the medication or equipment? Omeprazole, atorvastin, ibesartan  Have you contacted your pharmacy to request a refill? Yes   Which pharmacy would you like this sent to?   [facility] - [phone]     Patient notified that their request is being sent to the clinical staff for review and that they should receive a response within 2 business days.   Please advise at Mobile [phone] (mobile)

## 2023-05-28 ENCOUNTER — Telehealth: Payer: Self-pay

## 2023-05-28 NOTE — Progress Notes (Signed)
   05/28/2023  Patient ID: Abigail Collins, female   DOB: 1951-04-14, 72 y.o.   MRN: [id number]  Patient is returning my call to reschedule missed telephone visit for medication review; we were able to schedule for next Thursday at 4pm.  She also mentions that she had a fall last night and is experiencing pain around her rib cage; she believes she may have broken a rib.  I suggested going to Urgent Care, but she does not want to pay the copay for that or the ED.  She states she would like to see Dr. Yetta Barre.  I told her I would pass the message along but could not guarantee they could get her in today or tomorrow.  I reiterated that if symptoms worsened or were not improving by tomorrow, she should go to urgent care.  [name], PharmD, DPLA

## 2023-06-04 ENCOUNTER — Other Ambulatory Visit: Payer: Medicare PPO

## 2023-06-04 NOTE — Progress Notes (Signed)
06/04/2023 Name: Abigail Collins MRN: [id number] DOB: 1950/12/28  Chief Complaint  Patient presents with   Medication Management   Abigail Collins is a 72 y.o. year old female who presented for a telephone visit to review medications per clinic routed request from PCP, Abigail Collins  Subjective:  Care Team: Primary Care Provider: [name], MD ; Next Scheduled Visit: 10/14  Medication Access/Adherence  Current Pharmacy:  [id number] [place], [place] - [address] AT [facility] [address]  [place] [address] Phone: [phone] Fax: [phone]  [facility] - [address], [place] - [address] [place] [address] Phone: [phone] Fax: [phone]  [facility] - [place], [place] - [address] [place] [address] Phone: [phone] Fax: [phone]  [facility] (New Address) - [name], [place] - [address] [facility] AT Previously: [name], [place] [address] [facility] [address] [place] [address] Phone: [phone] Fax: [phone]  [facility] #[id number] [place], [place] - [address] AT [facility] [address] [place] [address] Phone: [phone] Fax: [phone]  -Patient reports affordability concerns with their medications: No  -Patient reports access/transportation concerns to their pharmacy: No  -Patient reports adherence concerns with their medications:  No    Diabetes: Current medications: Farxiga 10mg  daily, Lantus 30 units daily, Metformin 1000mg  BUD, Ozempic 1mg  weekly -Current glucose readings: FBG 180 -Patient is using Freestyle Libre 3 for CGM; but she would like glucometer/strips/lancets to have on hand, too -Patient denies hypoglycemic s/sx including dizziness, shakiness, sweating.  -Patient denies hyperglycemic symptoms  including polyuria, polydipsia, polyphagia, nocturia, neuropathy, blurred vision. -Patient has had 2 doses of Ozempic at 1mg  dose; states she has had some constipation, but this seems to be improving w/o use of OTC medications -Current medication access support: T2DM grant through [employer]  Hypertension: Current medcations: irbesartan 300mg  daily -Medications previously tried: lisinopril caused SOB and amlodipine led to swelling -Patient does not regularly check home BP; last OV reading was 122/76 -Patient denies hypotensive s/sx including dizziness, lightheadedness.  -Patient denies hypertensive symptoms including headache, chest pain, shortness of breath  Hyperlipidemia/ASCVD Risk Reduction Current lipid lowering medications: atorvastatin 20mg  daily Antiplatelet regimen: None  Objective: Lab Results  Component Value Date   HGBA1C 8.5 (H) 02/09/2023   Lab Results  Component Value Date   CREATININE 0.97 11/22/2022   BUN 19 11/22/2022   NA 138 11/22/2022   K 4.1 11/22/2022   CL 106 11/22/2022   CO2 22 11/22/2022   Lab Results  Component Value Date   CHOL 145 10/27/2022   HDL 58.30 10/27/2022   LDLCALC 15 01/14/2022   LDLDIRECT 44.0 10/27/2022   TRIG 327.0 (H) 10/27/2022   CHOLHDL 2 10/27/2022   Medications Reviewed Today     Reviewed by [name], RPH (Pharmacist) on 06/04/23 at 1627  Med List Status: <None>   Medication Order Taking? Sig Documenting Provider Last Dose Status Informant  atorvastatin (LIPITOR) 20 MG tablet [id number] Yes Take 1 tablet (20 mg total) by mouth daily. [name], MD Taking Active   Continuous Blood Gluc Receiver (FREESTYLE LIBRE 3 READER) DEVI [id number] Yes 1 Act by Does not apply route daily. [name], MD Taking Active   Continuous Blood Gluc Sensor (FREESTYLE LIBRE 3 SENSOR) [place] [id number]  Yes 1 Act by Does not apply route daily. Place 1 sensor on the skin every 14 days. Use to check glucose continuously [name],  MD Taking Active   dapagliflozin propanediol (FARXIGA) 10 MG TABS tablet [id number] Yes Take 1 tablet (10 mg total) by mouth daily before breakfast. [name], MD Taking Active   Glucagon (GVOKE HYPOPEN 2-PACK) 1 MG/0.2ML SOAJ [id number] No Inject 1 Act into the skin daily as needed.  Patient not taking: Reported on 06/04/2023   [name], MD Not Taking Active   insulin glargine (LANTUS) 100 UNIT/ML Solostar Pen [id number] Yes Inject 30 Units into the skin daily. [name], MD Taking Active   Insulin Pen Needle (SURE COMFORT PEN NEEDLES) 32G X 4 MM MISC [id number] Yes Inject 1 Act into the skin 2 (two) times daily. [name], MD Taking Active   irbesartan (AVAPRO) 300 MG tablet [id number] Yes Take 1 tablet (300 mg total) by mouth daily. [name], MD Taking Active   metFORMIN (GLUCOPHAGE) 1000 MG tablet [id number] Yes Take 1 tablet (1,000 mg total) by mouth 2 (two) times daily with a meal. Rai, [name], MD Taking Active   omeprazole (PRILOSEC) 40 MG capsule [id number] Yes Take 1 capsule (40 mg total) by mouth daily. [name], MD Taking Active   Semaglutide, 1 MG/DOSE, 4 MG/3ML Namon Cirri [id number] Yes Inject 1 mg as directed once a week. [name], MD Taking Active            Assessment/Plan:   Diabetes: - Currently uncontrolled - Patient sees Dr. Yetta Barre again 10/14 and will be due for f/u A1c - If A1c >7 and patient has continued to tolerate 1mg  Ozempic, I recommend increasing to 2mg  weekly   - Prescriptions sent for meter/strips/lancets [facility] under [facility] standing order - Instructed patient to reach out if [employer] funding is exhausted before time to re-enroll, so I can assist with PAPs if needed  Hypertension: - Currently controlled - Continue current regimen and regular follow-up at this time  Hyperlipidemia/ASCVD Risk Reduction: - Currently Moderately controlled.  - LDL is at goal of <70, but TG's are elevated; recommend dietary  modifcations and OTC fish oil  Follow Up Plan: Will verify cost of testing supplies and inform patient  [name], PharmD, DPLA

## 2023-06-08 MED ORDER — ACCU-CHEK SOFTCLIX LANCETS MISC: 11 refills | Status: AC

## 2023-06-08 MED ORDER — ACCU-CHEK GUIDE ME W/DEVICE KIT: PACK | 0 refills | Status: AC

## 2023-06-08 MED ORDER — ACCU-CHEK GUIDE VI STRP: ORAL_STRIP | 11 refills | Status: AC

## 2023-06-08 NOTE — Progress Notes (Signed)
   06/08/2023  Patient ID: Abigail Collins, female   DOB: Dec 21, 1950, 72 y.o.   MRN: [id number]  Contacted pharmacy to verify prescriptions for meter, test strips, and lancets were received and verify insurance coverage of these. Everything is going through for a total of approximately $10 for everything, but patient would like these sent to the [facility]; because the other location is not sent on the weekends.  Contacted [facility] on [address] to have them transfer the prescriptions over to fill.  [name], PharmD, DPLA

## 2023-06-09 ENCOUNTER — Ambulatory Visit (INDEPENDENT_AMBULATORY_CARE_PROVIDER_SITE_OTHER): Payer: Medicare PPO

## 2023-06-09 ENCOUNTER — Encounter: Payer: Self-pay | Admitting: Family Medicine

## 2023-06-09 ENCOUNTER — Ambulatory Visit (INDEPENDENT_AMBULATORY_CARE_PROVIDER_SITE_OTHER): Payer: Medicare PPO | Admitting: Family Medicine

## 2023-06-09 ENCOUNTER — Ambulatory Visit: Payer: Medicare PPO | Admitting: Internal Medicine

## 2023-06-09 VITALS — BP 126/82 | HR 83 | Temp 97.3°F | Ht 64.0 in | Wt 209.0 lb

## 2023-06-09 DIAGNOSIS — Z23 Encounter for immunization: Secondary | ICD-10-CM

## 2023-06-09 DIAGNOSIS — R630 Anorexia: Secondary | ICD-10-CM | POA: Diagnosis not present

## 2023-06-09 DIAGNOSIS — R0781 Pleurodynia: Secondary | ICD-10-CM

## 2023-06-09 DIAGNOSIS — W19XXXA Unspecified fall, initial encounter: Secondary | ICD-10-CM | POA: Diagnosis not present

## 2023-06-09 DIAGNOSIS — K5903 Drug induced constipation: Secondary | ICD-10-CM | POA: Diagnosis not present

## 2023-06-09 NOTE — Patient Instructions (Signed)
Please go downstairs for an x-ray before you leave.  Continue using Tiger balm and Advil as needed.  You may want to consider using the heating pad.  Use a pillow to splint the area if you are moving around or having to cough or sneeze.  Start MiraLAX 1 capful daily to improve bowel movements related to Ozempic.  Ozempic decreases your appetite and makes you feel full after just a few bites.  This is a normal effect of the medicine.  Make sure you are staying hydrated and eating small amounts throughout the day.

## 2023-06-09 NOTE — Progress Notes (Signed)
Subjective:     Patient ID: Abigail Collins, female    DOB: 1951/08/31, 72 y.o.   MRN: [id number]  Chief Complaint  Patient presents with   [name] in bathroom about a week and a half ago, stumped toe on rug in front of toilet and landed on her knees and toilet it the right side. Still giving her pain, mostly painful when up and moving    Fall Pertinent negatives include no abdominal pain, fever, headaches, nausea or vomiting.    Discussed the use of AI scribe software for clinical note transcription with the patient, who gave verbal consent to proceed.  History of Present Illness         Here with c/o right side pain/rib tenderness. Pain with movements. Holding her side helps.   Taking Advil daily and Tiger balm. Used a heating pad.  Pain is improving.   States she tripped on a rug in her bathroom approximately 2 weeks ago. Denies LOC. Denies hitting her head. No other injury.  Walks with a cane.   States her family is concerned that her appetite is decreased. She is also having constipation. Started on Ozempic recently.      There are no preventive care reminders to display for this patient.   Past Medical History:  Diagnosis Date   Anemia    hx of   Anginal pain (HCC)    "left arm pain, sees Dr. Dione Housekeeper, had card cath 2013"   Arthritis    "all over" (09/14/2012)   Asthma    takes inhaler 2x day   Bronchitis    hx of   Bulging disc    "lower"   Carpal tunnel syndrome of left wrist    Depression    denies   Exertional dyspnea    "sometimes laying down" (09/14/2012)   Headache    frequent headaches,usually if she does not eat   Hyperlipidemia    Hypertension    sees Dr. Nolon Bussing, primary   Pneumonia 03/2012   PONV (postoperative nausea and vomiting)    Thyroid disease 1960's   "don't have it now" (09/14/2012)   Type II diabetes mellitus (HCC)    Urinary tract infection    hx of   Vomiting    pt states she vomits every am    Past Surgical  History:  Procedure Laterality Date   CARDIAC CATHETERIZATION  05/13/2012   mod luminal irregularity of pLAD, no sign CAD, EF 65%.   CHOLECYSTECTOMY  1970's   KYPHOPLASTY N/A 02/10/2013   Procedure:  LUMBAR TWO KYPHOPLASTY;  Surgeon: Cristi Loron, MD;  Location: [facility];  Service: Neurosurgery;  Laterality: N/A;  L2 Kyphoplasty; Will use Stern's Carm.   ORIF HUMERUS FRACTURE Right 01/06/2018   Procedure: OPEN REDUCTION INTERNAL FIXATION (ORIF) RIGHT PROXIMAL HUMERUS FRACTURE;  Surgeon: Tarry Kos, MD;  Location: [facility];  Service: Orthopedics;  Laterality: Right;   TOTAL ELBOW REPLACEMENT  ~ 2005   "right" (09/14/2012)   TOTAL KNEE ARTHROPLASTY  09/13/2012   Procedure: TOTAL KNEE ARTHROPLASTY;  Surgeon: Dannielle Huh, MD;  Location: [facility];  Service: Orthopedics;  Laterality: Right;   TOTAL KNEE ARTHROPLASTY Left 08/02/2018   Procedure: LEFT TOTAL KNEE ARTHROPLASTY;  Surgeon: Tarry Kos, MD;  Location: [facility];  Service: Orthopedics;  Laterality: Left;   TUBAL LIGATION  1970's   VAGINAL HYSTERECTOMY  1970's    Family History  Problem Relation Age of Onset  Arthritis Mother    Arthritis Father    Hypertension Father    Diabetes Father    Colon cancer Neg Hx     Social History   Socioeconomic History   Marital status: Married    Spouse name: Not on file   Number of children: Not on file   Years of education: Not on file   Highest education level: Not on file  Occupational History   Not on file  Tobacco Use   Smoking status: Former    Current packs/day: 0.00    Average packs/day: 0.3 packs/day for 20.0 years (5.0 ttl pk-yrs)    Types: Cigarettes    Start date: 10/13/1989    Quit date: 10/13/2009    Years since quitting: 13.6    Passive exposure: Past   Smokeless tobacco: Never  Vaping Use   Vaping status: Never Used  Substance and Sexual Activity   Alcohol use: No    Comment: 09/14/2012 "did drink a little in my younger days"   Drug use: No   Sexual activity: Not  Currently    Partners: Female  Other Topics Concern   Not on file  Social History Narrative   Not on file   Social Determinants of Health   Financial Resource Strain: Low Risk  (02/05/2023)   Overall Financial Resource Strain (CARDIA)    Difficulty of Paying Living Expenses: Not hard at all  Food Insecurity: No Food Insecurity (02/05/2023)   Hunger Vital Sign    Worried About Running Out of Food in the Last Year: Never true    Ran Out of Food in the Last Year: Never true  Transportation Needs: No Transportation Needs (02/05/2023)   PRAPARE - Administrator, Civil Service (Medical): No    Lack of Transportation (Non-Medical): No  Physical Activity: Inactive (02/05/2023)   Exercise Vital Sign    Days of Exercise per Week: 0 days    Minutes of Exercise per Session: 0 min  Stress: No Stress Concern Present (02/05/2023)   [employer] of Occupational Health - Occupational Stress Questionnaire    Feeling of Stress : Not at all  Social Connections: Moderately Isolated (02/05/2023)   Social Connection and Isolation Panel [NHANES]    Frequency of Communication with Friends and Family: More than three times a week    Frequency of Social Gatherings with Friends and Family: Once a week    Attends Religious Services: Never    Database administrator or Organizations: No    Attends Banker Meetings: Never    Marital Status: Married  Catering manager Violence: Not At Risk (02/05/2023)   Humiliation, Afraid, Rape, and Kick questionnaire    Fear of Current or Ex-Partner: No    Emotionally Abused: No    Physically Abused: No    Sexually Abused: No    Outpatient Medications Prior to Visit  Medication Sig Dispense Refill   Accu-Chek Softclix Lancets lancets Use to check blood glucose twice daily E11.65 T2DM 200 each 11   atorvastatin (LIPITOR) 20 MG tablet Take 1 tablet (20 mg total) by mouth daily. 90 tablet 0   Blood Glucose Monitoring Suppl (ACCU-CHEK GUIDE ME)  w/Device KIT Use to check blood glucose twice daily E11.65 T2DM 1 kit 0   Continuous Blood Gluc Receiver (FREESTYLE LIBRE 3 READER) DEVI 1 Act by Does not apply route daily. 1 each 3   Continuous Blood Gluc Sensor (FREESTYLE LIBRE 3 SENSOR) MISC 1 Act by Does not apply  route daily. Place 1 sensor on the skin every 14 days. Use to check glucose continuously 2 each 5   dapagliflozin propanediol (FARXIGA) 10 MG TABS tablet Take 1 tablet (10 mg total) by mouth daily before breakfast. 90 tablet 0   Glucagon (GVOKE HYPOPEN 2-PACK) 1 MG/0.2ML SOAJ Inject 1 Act into the skin daily as needed. 2 mL 5   glucose blood (ACCU-CHEK GUIDE) test strip Use to check blood glucose twice daily E11.65 T2DM 200 each 11   insulin glargine (LANTUS) 100 UNIT/ML Solostar Pen Inject 30 Units into the skin daily. 27 mL 0   Insulin Pen Needle (SURE COMFORT PEN NEEDLES) 32G X 4 MM MISC Inject 1 Act into the skin 2 (two) times daily. 100 each 1   irbesartan (AVAPRO) 300 MG tablet Take 1 tablet (300 mg total) by mouth daily. 90 tablet 0   metFORMIN (GLUCOPHAGE) 1000 MG tablet Take 1 tablet (1,000 mg total) by mouth 2 (two) times daily with a meal. 180 tablet 3   omeprazole (PRILOSEC) 40 MG capsule Take 1 capsule (40 mg total) by mouth daily. 90 capsule 0   Semaglutide, 1 MG/DOSE, 4 MG/3ML SOPN Inject 1 mg as directed once a week. 9 mL 0   No facility-administered medications prior to visit.    Allergies  Allergen Reactions   Lisinopril Shortness Of Breath   Amlodipine Swelling    UNSPECIFIED EDEMA   Tape Other (See Comments)    Tore skin--?hypofix   Morphine And Codeine Nausea And Vomiting   Percocet [Oxycodone-Acetaminophen] Nausea And Vomiting    Review of Systems  Constitutional:  Negative for chills and fever.  Respiratory:  Negative for shortness of breath.   Cardiovascular:  Negative for chest pain, palpitations and leg swelling.  Gastrointestinal:  Positive for constipation. Negative for abdominal pain, blood  in stool, diarrhea, nausea and vomiting.  Genitourinary:  Negative for dysuria, frequency and urgency.  Musculoskeletal:  Positive for falls and joint pain. Negative for back pain and neck pain.  Neurological:  Negative for dizziness, focal weakness and headaches.  Endo/Heme/Allergies:  Does not bruise/bleed easily.       Objective:    Physical Exam Constitutional:      General: She is not in acute distress.    Appearance: She is not ill-appearing.  HENT:     Head: Atraumatic.     Nose: Nose normal.     Mouth/Throat:     Mouth: Mucous membranes are moist.     Pharynx: Oropharynx is clear.  Eyes:     Extraocular Movements: Extraocular movements intact.     Conjunctiva/sclera: Conjunctivae normal.     Pupils: Pupils are equal, round, and reactive to light.  Cardiovascular:     Rate and Rhythm: Normal rate and regular rhythm.  Pulmonary:     Effort: Pulmonary effort is normal.     Breath sounds: Normal breath sounds.  Chest:     Chest wall: Tenderness present. No deformity, crepitus or edema.       Comments: Right lower anterior rib TTP without bruising or deformity.  Abdominal:     General: Bowel sounds are normal. There is no distension.     Palpations: Abdomen is soft.     Tenderness: There is no abdominal tenderness. There is no guarding or rebound.  Musculoskeletal:     Cervical back: Normal range of motion and neck supple. No tenderness.  Lymphadenopathy:     Cervical: No cervical adenopathy.  Skin:    General: Skin  is warm and dry.  Neurological:     General: No focal deficit present.     Mental Status: She is alert and oriented to person, place, and time.     Cranial Nerves: No cranial nerve deficit.     Motor: No weakness.     Coordination: Coordination normal.  Psychiatric:        Mood and Affect: Mood normal.        Behavior: Behavior normal.        Thought Content: Thought content normal.      BP 126/82 (BP Location: Left Arm, Patient Position:  Sitting, Cuff Size: Large)   Pulse 83   Temp (!) 97.3 F (36.3 C) (Temporal)   Ht 5\' 4"  (1.626 m)   Wt 209 lb (94.8 kg)   SpO2 97%   BMI 35.87 kg/m  Wt Readings from Last 3 Encounters:  06/09/23 209 lb (94.8 kg)  04/23/23 211 lb (95.7 kg)  02/09/23 216 lb (98 kg)       Assessment & Plan:   Problem List Items Addressed This Visit   None Visit Diagnoses     Rib pain on right side    -  Primary   Relevant Orders   DG Ribs Unilateral W/Chest Right   Fall, initial encounter       Relevant Orders   DG Ribs Unilateral W/Chest Right   Need for influenza vaccination       Relevant Orders   Flu Vaccine Trivalent High Dose (Fluad) (Completed)   Drug-induced constipation       Decreased appetite          Mechanical fall. Tripped over rug in bathroom. Walks with a cane. Recommend pulling up rugs that are fall hazard. No obvious injuries.  STAT X ray right ribs w/chest No acute distress.  Pain managed currently.  Counseling on Ozempic effects and recommend she start Miralax daily. Advised to stay hydrated and eat small amounts throughout the day.  Follow up with PCP.    I am having Abigail Collins maintain her metFORMIN, FreeStyle Libre 3 Sensor, [employer] 3 Reader, Sure Comfort Pen Needles, Gvoke HypoPen 2-Pack, Semaglutide (1 MG/DOSE), dapagliflozin propanediol, insulin glargine, atorvastatin, irbesartan, omeprazole, Accu-Chek Softclix Lancets, Accu-Chek Guide Me, and Accu-Chek Guide.  No orders of the defined types were placed in this encounter.

## 2023-06-09 NOTE — Progress Notes (Signed)
Please let her know that a rib fracture was not seen on x-ray.  As long as she continues to improve then no further imaging is needed.  Let us know if she has any new or worsening symptoms or if she is not back to baseline in 2 to 4 weeks.

## 2023-07-27 ENCOUNTER — Ambulatory Visit: Payer: Medicare PPO | Admitting: Internal Medicine

## 2023-08-04 ENCOUNTER — Other Ambulatory Visit: Payer: Self-pay | Admitting: Internal Medicine

## 2023-08-04 DIAGNOSIS — K219 Gastro-esophageal reflux disease without esophagitis: Secondary | ICD-10-CM

## 2023-08-04 DIAGNOSIS — E785 Hyperlipidemia, unspecified: Secondary | ICD-10-CM

## 2023-08-04 DIAGNOSIS — I10 Essential (primary) hypertension: Secondary | ICD-10-CM

## 2023-08-31 ENCOUNTER — Ambulatory Visit: Payer: Medicare PPO | Admitting: Internal Medicine

## 2023-09-29 ENCOUNTER — Encounter: Payer: Self-pay | Admitting: Internal Medicine

## 2023-09-29 ENCOUNTER — Ambulatory Visit (INDEPENDENT_AMBULATORY_CARE_PROVIDER_SITE_OTHER): Payer: Medicare PPO | Admitting: Internal Medicine

## 2023-09-29 VITALS — BP 132/80 | HR 90 | Temp 97.7°F | Ht 64.0 in | Wt 212.0 lb

## 2023-09-29 DIAGNOSIS — E119 Type 2 diabetes mellitus without complications: Secondary | ICD-10-CM

## 2023-09-29 DIAGNOSIS — R29898 Other symptoms and signs involving the musculoskeletal system: Secondary | ICD-10-CM

## 2023-09-29 DIAGNOSIS — E785 Hyperlipidemia, unspecified: Secondary | ICD-10-CM | POA: Diagnosis not present

## 2023-09-29 DIAGNOSIS — Z794 Long term (current) use of insulin: Secondary | ICD-10-CM

## 2023-09-29 DIAGNOSIS — I10 Essential (primary) hypertension: Secondary | ICD-10-CM | POA: Diagnosis not present

## 2023-09-29 DIAGNOSIS — K219 Gastro-esophageal reflux disease without esophagitis: Secondary | ICD-10-CM | POA: Diagnosis not present

## 2023-09-29 DIAGNOSIS — R9431 Abnormal electrocardiogram [ECG] [EKG]: Secondary | ICD-10-CM

## 2023-09-29 DIAGNOSIS — E781 Pure hyperglyceridemia: Secondary | ICD-10-CM | POA: Diagnosis not present

## 2023-09-29 DIAGNOSIS — E1122 Type 2 diabetes mellitus with diabetic chronic kidney disease: Secondary | ICD-10-CM

## 2023-09-29 DIAGNOSIS — N182 Chronic kidney disease, stage 2 (mild): Secondary | ICD-10-CM

## 2023-09-29 DIAGNOSIS — M5416 Radiculopathy, lumbar region: Secondary | ICD-10-CM

## 2023-09-29 NOTE — Patient Instructions (Signed)

## 2023-09-29 NOTE — Progress Notes (Unsigned)
Subjective:  Patient ID: Abigail Collins, female    DOB: [birth year]  Age: 72 y.o. MRN: [id number]  CC: Hypertension, Hyperlipidemia, Diabetes, and Back Pain   HPI Abigail Collins presents for f/up ---  Discussed the use of AI scribe software for clinical note transcription with the patient, who gave verbal consent to proceed.  History of Present Illness   The patient presents with a chief complaint of severe low back pain, which has progressed to the point of significantly impairing her ability to walk. The pain is localized to the lower back and radiates into the left leg, causing a sensation of weakness and fatigue. The patient describes the leg as feeling "tired," as if she has been running for a long time. This weakness is present both during the day and at night, and it affects her balance, causing her to stumble frequently. The patient denies any numbness or tingling in the legs.  The patient has a history of kyphoplasty performed several years ago for back pain. The last imaging of the back was done around the time of this surgery, approximately ten years ago.   The patient's back pain and leg weakness significantly impact her daily activities and overall quality of life. She describes feeling as if she is "a hundred years old" and expresses frustration at her inability to perform tasks due to lack of energy and balance issues. The patient also mentions having diabetes, but it is unclear how well this condition is controlled. She can't afford farxiga or ozempic.  The patient denies any neck pain and reports no new injuries or recent changes in her medical regimen. She has been experiencing these symptoms for an extended period, indicating a chronic condition. Despite the severity of her symptoms, the patient does not express a desire for surgical intervention at this time.       Outpatient Medications Prior to Visit  Medication Sig Dispense Refill   Accu-Chek Softclix Lancets  lancets Use to check blood glucose twice daily E11.65 T2DM 200 each 11   atorvastatin (LIPITOR) 20 MG tablet TAKE 1 TABLET EVERY DAY 90 tablet 0   Blood Glucose Monitoring Suppl (ACCU-CHEK GUIDE ME) w/Device KIT Use to check blood glucose twice daily E11.65 T2DM 1 kit 0   Continuous Blood Gluc Receiver (FREESTYLE LIBRE 3 READER) DEVI 1 Act by Does not apply route daily. 1 each 3   Continuous Blood Gluc Sensor (FREESTYLE LIBRE 3 SENSOR) MISC 1 Act by Does not apply route daily. Place 1 sensor on the skin every 14 days. Use to check glucose continuously 2 each 5   Glucagon (GVOKE HYPOPEN 2-PACK) 1 MG/0.2ML SOAJ Inject 1 Act into the skin daily as needed. 2 mL 5   glucose blood (ACCU-CHEK GUIDE) test strip Use to check blood glucose twice daily E11.65 T2DM 200 each 11   insulin glargine (LANTUS) 100 UNIT/ML Solostar Pen Inject 30 Units into the skin daily. 27 mL 0   Insulin Pen Needle (SURE COMFORT PEN NEEDLES) 32G X 4 MM MISC Inject 1 Act into the skin 2 (two) times daily. 100 each 1   irbesartan (AVAPRO) 300 MG tablet TAKE 1 TABLET EVERY DAY 90 tablet 0   metFORMIN (GLUCOPHAGE) 1000 MG tablet Take 1 tablet (1,000 mg total) by mouth 2 (two) times daily with a meal. 180 tablet 3   omeprazole (PRILOSEC) 40 MG capsule TAKE 1 CAPSULE EVERY DAY 90 capsule 0   dapagliflozin propanediol (FARXIGA) 10 MG TABS tablet Take 1 tablet (  10 mg total) by mouth daily before breakfast. (Patient not taking: Reported on 09/29/2023) 90 tablet 0   Semaglutide, 1 MG/DOSE, 4 MG/3ML SOPN Inject 1 mg as directed once a week. (Patient not taking: Reported on 09/29/2023) 9 mL 0   No facility-administered medications prior to visit.    ROS Review of Systems  Constitutional:  Positive for fatigue. Negative for activity change, appetite change, chills, diaphoresis, fever and unexpected weight change.  HENT: Negative.    Eyes: Negative.   Respiratory:  Negative for cough, chest tightness, shortness of breath and wheezing.    Cardiovascular:  Negative for chest pain, palpitations and leg swelling.  Gastrointestinal:  Negative for abdominal pain, blood in stool, constipation, diarrhea, nausea and vomiting.  Genitourinary:  Negative for difficulty urinating.  Musculoskeletal:  Positive for arthralgias, back pain and gait problem. Negative for joint swelling and myalgias.  Skin: Negative.   Neurological:  Positive for weakness. Negative for dizziness, light-headedness and headaches.  Hematological:  Negative for adenopathy. Does not bruise/bleed easily.  Psychiatric/Behavioral: Negative.      Objective:  BP 132/80 (BP Location: Left Arm, Patient Position: Sitting, Cuff Size: Large)   Pulse 90   Temp 97.7 F (36.5 C) (Temporal)   Ht 5\' 4"  (1.626 m)   Wt 212 lb (96.2 kg)   SpO2 96%   BMI 36.39 kg/m   BP Readings from Last 3 Encounters:  09/29/23 132/80  06/09/23 126/82  04/23/23 122/76    Wt Readings from Last 3 Encounters:  09/29/23 212 lb (96.2 kg)  06/09/23 209 lb (94.8 kg)  04/23/23 211 lb (95.7 kg)    Physical Exam Vitals reviewed.  Constitutional:      General: She is not in acute distress.    Appearance: She is obese. She is not ill-appearing, toxic-appearing or diaphoretic.  HENT:     Nose: Nose normal.     Mouth/Throat:     Mouth: Mucous membranes are moist.  Eyes:     General: No scleral icterus.    Conjunctiva/sclera: Conjunctivae normal.  Cardiovascular:     Rate and Rhythm: Normal rate and regular rhythm.     Heart sounds: No murmur heard.    No friction rub. No gallop.     Comments: EKG- NSR, 78 bpm LAD Inferior/anterior/lateral infarct patterns are old ?LVH No acute changes Pulmonary:     Effort: Pulmonary effort is normal.     Breath sounds: No stridor. No wheezing, rhonchi or rales.  Abdominal:     General: Abdomen is protuberant. There is no distension.     Palpations: There is no mass.     Tenderness: There is no abdominal tenderness. There is no guarding.      Hernia: No hernia is present.  Musculoskeletal:        General: No swelling. Normal range of motion.     Cervical back: Neck supple.     Right lower leg: No edema.     Left lower leg: No edema.  Lymphadenopathy:     Cervical: No cervical adenopathy.  Skin:    General: Skin is warm and dry.  Neurological:     Mental Status: She is alert.     Cranial Nerves: Cranial nerves 2-12 are intact.     Sensory: Sensation is intact.     Motor: Weakness present. No tremor, atrophy, abnormal muscle tone, seizure activity or pronator drift.     Coordination: Romberg sign positive. Coordination abnormal.     Gait: Gait abnormal and  tandem walk abnormal.     Deep Tendon Reflexes: Babinski sign absent on the left side.     Reflex Scores:      Tricep reflexes are 0 on the right side and 0 on the left side.      Bicep reflexes are 0 on the right side and 0 on the left side.      Brachioradialis reflexes are 0 on the right side and 0 on the left side.      Patellar reflexes are 0 on the right side and 0 on the left side.      Achilles reflexes are 0 on the right side and 0 on the left side.    Comments: Weakness in the LLE  Psychiatric:        Mood and Affect: Mood normal.        Behavior: Behavior normal.     Lab Results  Component Value Date   WBC 7.2 11/21/2022   HGB 12.9 11/21/2022   HCT 42.7 11/21/2022   PLT 156 11/21/2022   GLUCOSE 185 (H) 11/22/2022   CHOL 145 10/27/2022   TRIG 327.0 (H) 10/27/2022   HDL 58.30 10/27/2022   LDLDIRECT 44.0 10/27/2022   LDLCALC 15 01/14/2022   ALT 18 11/21/2022   AST 21 11/21/2022   NA 138 11/22/2022   K 4.1 11/22/2022   CL 106 11/22/2022   CREATININE 0.97 11/22/2022   BUN 19 11/22/2022   CO2 22 11/22/2022   TSH 2.01 10/27/2022   INR 0.91 07/26/2018   HGBA1C 8.5 (H) 02/09/2023   MICROALBUR <0.7 10/27/2022    No results found.  Assessment & Plan:  Hyperlipidemia with target LDL less than 100 -     Lipid panel; Future -     TSH; Future -      Hepatic function panel; Future  Pure hyperglyceridemia -     Lipid panel; Future -     Hepatic function panel; Future  HTN (hypertension), benign -     TSH; Future -     Urinalysis, Routine w reflex microscopic; Future -     Hepatic function panel; Future -     Basic metabolic panel; Future -     CBC with Differential/Platelet; Future -     EKG 12-Lead  Gastroesophageal reflux disease without esophagitis -     CBC with Differential/Platelet; Future  Insulin-requiring or dependent type II diabetes mellitus (HCC) -     Urinalysis, Routine w reflex microscopic; Future -     Hepatic function panel; Future -     Hemoglobin A1c; Future -     Microalbumin / creatinine urine ratio; Future -     HM Diabetes Foot Exam -     AMB Referral VBCI Care Management  Lumbar radiculitis -     MR LUMBAR SPINE WO CONTRAST; Future  Left leg weakness -     MR LUMBAR SPINE WO CONTRAST; Future  Abnormal electrocardiogram (ECG) (EKG) -     ECHOCARDIOGRAM COMPLETE; Future     Follow-up: Return in about 3 months (around 12/28/2023).  [name], MD

## 2023-09-30 ENCOUNTER — Telehealth: Payer: Self-pay

## 2023-09-30 LAB — HEMOGLOBIN A1C: Hgb A1c MFr Bld: 10.9 % — ABNORMAL HIGH (ref 4.6–6.5)

## 2023-09-30 LAB — HEPATIC FUNCTION PANEL
ALT: 20 U/L (ref 0–35)
AST: 23 U/L (ref 0–37)
Albumin: 4.2 g/dL (ref 3.5–5.2)
Alkaline Phosphatase: 93 U/L (ref 39–117)
Bilirubin, Direct: 0.1 mg/dL (ref 0.0–0.3)
Total Bilirubin: 0.6 mg/dL (ref 0.2–1.2)
Total Protein: 7.1 g/dL (ref 6.0–8.3)

## 2023-09-30 LAB — CBC WITH DIFFERENTIAL/PLATELET
Basophils Absolute: 0.1 10*3/uL (ref 0.0–0.1)
Basophils Relative: 1.4 % (ref 0.0–3.0)
Eosinophils Absolute: 0.2 10*3/uL (ref 0.0–0.7)
Eosinophils Relative: 2.3 % (ref 0.0–5.0)
HCT: 41.1 % (ref 36.0–46.0)
Hemoglobin: 13.4 g/dL (ref 12.0–15.0)
Lymphocytes Relative: 47.4 % — ABNORMAL HIGH (ref 12.0–46.0)
Lymphs Abs: 3.5 10*3/uL (ref 0.7–4.0)
MCHC: 32.6 g/dL (ref 30.0–36.0)
MCV: 89.5 fL (ref 78.0–100.0)
Monocytes Absolute: 0.5 10*3/uL (ref 0.1–1.0)
Monocytes Relative: 6.2 % (ref 3.0–12.0)
Neutro Abs: 3.1 10*3/uL (ref 1.4–7.7)
Neutrophils Relative %: 42.7 % — ABNORMAL LOW (ref 43.0–77.0)
Platelets: 175 10*3/uL (ref 150.0–400.0)
RBC: 4.6 Mil/uL (ref 3.87–5.11)
RDW: 15.2 % (ref 11.5–15.5)
WBC: 7.3 10*3/uL (ref 4.0–10.5)

## 2023-09-30 LAB — BASIC METABOLIC PANEL
BUN: 17 mg/dL (ref 6–23)
CO2: 26 meq/L (ref 19–32)
Calcium: 9.8 mg/dL (ref 8.4–10.5)
Chloride: 104 meq/L (ref 96–112)
Creatinine, Ser: 0.87 mg/dL (ref 0.40–1.20)
GFR: 66.95 mL/min (ref >60.00)
Glucose, Bld: 199 mg/dL — ABNORMAL HIGH (ref 70–99)
Potassium: 4.7 meq/L (ref 3.5–5.1)
Sodium: 139 meq/L (ref 135–145)

## 2023-09-30 LAB — LIPID PANEL
Cholesterol: 148 mg/dL (ref 0–200)
HDL: 58.4 mg/dL (ref >39.00)
LDL Cholesterol: 20 mg/dL (ref 0–99)
NonHDL: 89.51
Total CHOL/HDL Ratio: 3
Triglycerides: 350 mg/dL — ABNORMAL HIGH (ref 0.0–149.0)
VLDL: 70 mg/dL — ABNORMAL HIGH (ref 0.0–40.0)

## 2023-09-30 LAB — TSH: TSH: 1.66 u[IU]/mL (ref 0.35–5.50)

## 2023-09-30 LAB — MICROALBUMIN / CREATININE URINE RATIO
Creatinine,U: 115.3 mg/dL
Microalb Creat Ratio: 1.6 mg/g (ref 0.0–30.0)
Microalb, Ur: 1.8 mg/dL (ref 0.0–1.9)

## 2023-09-30 MED ORDER — METFORMIN HCL ER 750 MG PO TB24: 1500.0000 mg | ORAL_TABLET | Freq: Every day | ORAL | 0 refills | Status: DC

## 2023-09-30 MED ORDER — INSULIN GLARGINE 100 UNIT/ML SOLOSTAR PEN: 40.0000 [IU] | PEN_INJECTOR | Freq: Every day | SUBCUTANEOUS | 0 refills | Status: DC

## 2023-09-30 NOTE — Progress Notes (Signed)
Care Guide Pharmacy Note  09/30/2023 Name: Abigail Collins MRN: [id number] DOB: [birth year]  Referred By: [name], MD Reason for referral: Care Coordination (Outreach to schedule with Pharm d )   Abigail Collins is a 72 y.o. year old female who is a primary care patient of [name], MD.  Abigail Collins was referred to the pharmacist for assistance related to: DMII  Successful contact was made with the patient to discuss pharmacy services including being ready for the pharmacist to call at least 5 minutes before the scheduled appointment time and to have medication bottles and any blood pressure readings ready for review. The patient agreed to meet with the pharmacist via telephone visit on (date/time).10/19/2023  Penne Lash , RMA     [place], [facility] Guide  Direct Dial: [phone]  Website: [email or web address]

## 2023-10-01 DIAGNOSIS — E1122 Type 2 diabetes mellitus with diabetic chronic kidney disease: Secondary | ICD-10-CM | POA: Insufficient documentation

## 2023-10-01 DIAGNOSIS — Z794 Long term (current) use of insulin: Secondary | ICD-10-CM | POA: Insufficient documentation

## 2023-10-01 MED ORDER — DAPAGLIFLOZIN PROPANEDIOL 10 MG PO TABS: 10.0000 mg | ORAL_TABLET | Freq: Every day | ORAL | 0 refills | Status: DC

## 2023-10-01 MED ORDER — OZEMPIC (0.25 OR 0.5 MG/DOSE) 2 MG/3ML ~~LOC~~ SOPN: 0.2500 mg | PEN_INJECTOR | SUBCUTANEOUS | 0 refills | Status: DC

## 2023-10-19 ENCOUNTER — Other Ambulatory Visit: Payer: Medicare PPO | Admitting: Pharmacist

## 2023-10-19 DIAGNOSIS — E1122 Type 2 diabetes mellitus with diabetic chronic kidney disease: Secondary | ICD-10-CM

## 2023-10-19 MED ORDER — TRESIBA FLEXTOUCH 100 UNIT/ML ~~LOC~~ SOPN: 40.0000 [IU] | PEN_INJECTOR | Freq: Every day | SUBCUTANEOUS | Status: DC

## 2023-10-19 NOTE — Patient Instructions (Signed)
 It was a pleasure speaking with you today!  I will leave the patient assistance applications at the front desk for you to come sign. Once I have your signature, I will fax them to the manufacturers for approval.  Feel free to call with any questions or concerns!  [name], PharmD, BCPS [facility] Clinical Pharmacist [facility] [phone]

## 2023-10-19 NOTE — Progress Notes (Signed)
 10/19/2023 Name: Abigail Collins MRN: [id number] DOB: 02/20/1951  Chief Complaint  Patient presents with   Medication Access    Abigail Collins is a 73 y.o. year old female who presented for a telephone visit.   They were referred to the pharmacist by their PCP for assistance in managing diabetes and medication access.    Subjective:  Care Team: Primary Care Provider: Joshua Debby CROME, MD ; Next Scheduled Visit: not scheduled  Medication Access/Adherence  Current Pharmacy:  [facility] #[id number] [place] [address], [place] - [address] AT [facility] OF [facility] & [address] [place] [address] Phone: [phone] Fax: [phone]    Patient reports affordability concerns with their medications: Yes  Patient reports access/transportation concerns to their pharmacy: No  Patient reports adherence concerns with their medications:  No    She is unable to afford Ozempic  and Farxiga  - she notes she used to get them through a grant but it ran out. Unclear if this was PAP or a grant.   Diabetes:  Current medications: Lantus  40 units daily, Metformin  XR 750 mg daily *Prescribed Farxiga  and Ozempic , not currently taking due to cost     Objective:  Lab Results  Component Value Date   HGBA1C 10.9 Repeated and verified X2. (H) 09/29/2023    Lab Results  Component Value Date   CREATININE 0.87 09/29/2023   BUN 17 09/29/2023   NA 139 09/29/2023   K 4.7 09/29/2023   CL 104 09/29/2023   CO2 26 09/29/2023    Lab Results  Component Value Date   CHOL 148 09/29/2023   HDL 58.40 09/29/2023   LDLCALC 20 09/29/2023   LDLDIRECT 44.0 10/27/2022   TRIG 350.0 (H) 09/29/2023   CHOLHDL 3 09/29/2023    Medications Reviewed Today     Reviewed by Abigail Collins, RPH (Pharmacist) on 10/19/23 at 1117  Med List Status: <None>   Medication Order Taking? Sig Documenting Provider Last Dose Status Informant  Accu-Chek Softclix Lancets lancets  [id number]  Use to check blood glucose twice daily E11.65 T2DM Abigail Debby CROME, MD  Active   atorvastatin  (LIPITOR) 20 MG tablet [id number]  TAKE 1 TABLET EVERY DAY Abigail Debby CROME, MD  Active   Blood Glucose Monitoring Suppl (ACCU-CHEK GUIDE ME) w/Device KIT [id number]  Use to check blood glucose twice daily E11.65 T2DM Abigail Debby CROME, MD  Active   Continuous Blood Gluc Receiver (FREESTYLE LIBRE 3 READER) DEVI [id number]  1 Act by Does not apply route daily. Abigail Debby CROME, MD  Active   Continuous Blood Gluc Sensor (FREESTYLE LIBRE 3 SENSOR) [place] [id number]  1 Act by Does not apply route daily. Place 1 sensor on the skin every 14 days. Use to check glucose continuously Abigail Debby CROME, MD  Active   dapagliflozin  propanediol (FARXIGA ) 10 MG TABS tablet [id number] No Take 1 tablet (10 mg total) by mouth daily before breakfast.  Patient not taking: Reported on 10/19/2023   Abigail Debby CROME, MD Not Taking Active   Glucagon  (GVOKE HYPOPEN  2-PACK) 1 MG/0.2ML Abigail Collins [id number]  Inject 1 Act into the skin daily as needed. Abigail Debby CROME, MD  Active   glucose blood (ACCU-CHEK GUIDE) test strip [id number]  Use to check blood glucose twice daily E11.65 T2DM Abigail Debby CROME, MD  Active   insulin  glargine (LANTUS ) 100 UNIT/ML Solostar Pen [id number] Yes Inject 40 Units into the skin daily. Abigail Debby CROME, MD Taking Active  Insulin  Pen Needle (SURE COMFORT PEN NEEDLES) 32G X 4 MM MISC [id number]  Inject 1 Act into the skin 2 (two) times daily. Abigail Debby CROME, MD  Active   irbesartan  (AVAPRO ) 300 MG tablet [id number]  TAKE 1 TABLET EVERY DAY Abigail Debby CROME, MD  Active   metFORMIN  (GLUCOPHAGE -XR) 750 MG 24 hr tablet [id number]  Take 2 tablets (1,500 mg total) by mouth daily with breakfast. Abigail Debby CROME, MD  Active   omeprazole  (PRILOSEC) 40 MG capsule [id number]  TAKE 1 CAPSULE EVERY DAY Abigail Debby CROME, MD  Active   Semaglutide ,0.25 or 0.5MG /DOS, (OZEMPIC , 0.25 OR 0.5 MG/DOSE,) 2 MG/3ML SOPN [id number] No Inject 0.25 mg  into the skin once a week.  Patient not taking: Reported on 10/19/2023   Abigail Debby CROME, MD Not Taking Active               Assessment/Plan:   Diabetes: - Currently uncontrolled, Goal A1c <8% - Meets financial criteria for Ozempic  and Farxiga  patient assistance programs. Will collaborate with provider, CPhT, and patient to pursue assistance.  -Will also change Lantus  to Tresiba  so she can get at no cost as well    Follow Up Plan: 3 weeks  Abigail Collins, PharmD, BCPS, CPP Clinical Pharmacist Practitioner [place] [facility] at [facility] [phone]

## 2023-10-20 ENCOUNTER — Other Ambulatory Visit: Payer: Self-pay | Admitting: Internal Medicine

## 2023-10-20 DIAGNOSIS — I10 Essential (primary) hypertension: Secondary | ICD-10-CM

## 2023-10-20 DIAGNOSIS — K219 Gastro-esophageal reflux disease without esophagitis: Secondary | ICD-10-CM

## 2023-10-20 DIAGNOSIS — E785 Hyperlipidemia, unspecified: Secondary | ICD-10-CM

## 2023-10-23 ENCOUNTER — Inpatient Hospital Stay: Admission: RE | Admit: 2023-10-23 | Payer: Medicare PPO | Source: Ambulatory Visit

## 2023-10-27 ENCOUNTER — Telehealth: Payer: Self-pay | Admitting: Internal Medicine

## 2023-10-27 NOTE — Telephone Encounter (Signed)
 Patient completed the paperwork left at front desk by Arbutus Leas. It has been placed in Cristy's box up front.

## 2023-11-09 ENCOUNTER — Other Ambulatory Visit: Payer: Medicare PPO | Admitting: Pharmacist

## 2023-11-09 DIAGNOSIS — N3281 Overactive bladder: Secondary | ICD-10-CM

## 2023-11-09 NOTE — Progress Notes (Signed)
   11/10/2023 Name: Abigail Collins MRN: [id number] DOB: Jun 10, 1951  Chief Complaint  Patient presents with   Diabetes   Medication Management   Medication Access    Abigail Collins is a 73 y.o. year old female who presented for a telephone visit.   They were referred to the pharmacist by their PCP for assistance in managing diabetes and medication access.    Subjective:  Care Team: Primary Care Provider: [name], MD ; Next Scheduled Visit: not scheduled  Medication Access/Adherence  Current Pharmacy:  [facility] #[id number] [name], [place] - [address] AT [facility] OF [facility] & [address] [place] [address] Phone: [phone] Fax: [phone]    Patient reports affordability concerns with their medications: Yes  Patient reports access/transportation concerns to their pharmacy: No  Patient reports adherence concerns with their medications:  No    She is unable to afford Ozempic and [place] - she notes she used to get them through a grant but it ran out. Unclear if this was PAP or a grant.   Diabetes:  Current medications: Lantus 30 units daily, Metformin XR 750 mg daily *Prescribed Farxiga and Ozempic, not currently taking due to cost  BG still in the 200s in the AM and PM  Objective:  Lab Results  Component Value Date   HGBA1C 10.9 Repeated and verified X2. (H) 09/29/2023    Lab Results  Component Value Date   CREATININE 0.87 09/29/2023   BUN 17 09/29/2023   NA 139 09/29/2023   K 4.7 09/29/2023   CL 104 09/29/2023   CO2 26 09/29/2023    Lab Results  Component Value Date   CHOL 148 09/29/2023   HDL 58.40 09/29/2023   LDLCALC 20 09/29/2023   LDLDIRECT 44.0 10/27/2022   TRIG 350.0 (H) 09/29/2023   CHOLHDL 3 09/29/2023    Medications Reviewed Today   Medications were not reviewed in this encounter       Assessment/Plan:   Diabetes: - Currently uncontrolled, Goal A1c <8% - Meets  financial criteria for Ozempic and [place] patient assistance programs.  Confirmed Farxiga and Ozempic and Tresiba PAP were approved - waiting for shipments to arrive. - Recommended to increase Lantus to 40 units daily while waiting for meds  Urinary incontinence/OAB: Notes PCP had previously recommended starting a medication for OAB but pt declined. She states symptoms are worsened and would like to start something. Recommend Oxybutynin ER 5 mg daily  Follow Up Plan: 2/24  Arbutus Leas, PharmD, BCPS, CPP Clinical Pharmacist Practitioner [place] [facility] at [facility] [phone]

## 2023-11-10 MED ORDER — OXYBUTYNIN CHLORIDE ER 5 MG PO TB24: 5.0000 mg | ORAL_TABLET | Freq: Every day | ORAL | 0 refills | Status: DC

## 2023-11-16 ENCOUNTER — Other Ambulatory Visit: Payer: Medicare PPO

## 2023-11-25 ENCOUNTER — Telehealth: Payer: Self-pay | Admitting: Internal Medicine

## 2023-11-25 NOTE — Telephone Encounter (Signed)
Copied from CRM [id number]. Topic: Clinical - Medication Question >> Nov 25, 2023 10:09 AM Gaetano Hawthorne wrote: Reason for CRM: Patient is calling to see if her Ozempic and insulin pen (she wasn't sure the name of it) had been delivered to the office - Please call the patient when you have a moment.

## 2023-11-26 ENCOUNTER — Ambulatory Visit
Admission: RE | Admit: 2023-11-26 | Discharge: 2023-11-26 | Disposition: A | Discharge status: Home / Self Care | Payer: Medicare PPO | Source: Ambulatory Visit | Attending: Internal Medicine | Admitting: Internal Medicine

## 2023-11-26 DIAGNOSIS — R29898 Other symptoms and signs involving the musculoskeletal system: Secondary | ICD-10-CM

## 2023-11-26 DIAGNOSIS — M47816 Spondylosis without myelopathy or radiculopathy, lumbar region: Secondary | ICD-10-CM | POA: Diagnosis not present

## 2023-11-26 DIAGNOSIS — M5416 Radiculopathy, lumbar region: Secondary | ICD-10-CM

## 2023-11-26 NOTE — Telephone Encounter (Signed)
Unable to reach patient [email or web address]

## 2023-11-30 ENCOUNTER — Ambulatory Visit (HOSPITAL_COMMUNITY): Payer: Medicare PPO | Attending: Internal Medicine

## 2023-11-30 DIAGNOSIS — R9431 Abnormal electrocardiogram [ECG] [EKG]: Secondary | ICD-10-CM | POA: Diagnosis not present

## 2023-11-30 LAB — ECHOCARDIOGRAM COMPLETE
Area-P 1/2: 2.56 cm2
Calc EF: 66.7 %
S' Lateral: 2.2 cm
Single Plane A2C EF: 65.9 %
Single Plane A4C EF: 69 %

## 2023-12-02 ENCOUNTER — Encounter: Payer: Self-pay | Admitting: Internal Medicine

## 2023-12-02 NOTE — Telephone Encounter (Signed)
Pharmacist will touch bases with her on 2/24

## 2023-12-07 ENCOUNTER — Other Ambulatory Visit: Payer: Self-pay | Admitting: Internal Medicine

## 2023-12-07 ENCOUNTER — Other Ambulatory Visit: Payer: Medicare PPO | Admitting: Pharmacist

## 2023-12-07 DIAGNOSIS — K219 Gastro-esophageal reflux disease without esophagitis: Secondary | ICD-10-CM

## 2023-12-07 NOTE — Progress Notes (Signed)
   12/07/2023 Name: Abigail Collins MRN: [id number] DOB: 03/06/1951  Chief Complaint  Patient presents with   Diabetes   Medication Management    Abigail Collins is a 73 y.o. year old female who presented for a telephone visit.   They were referred to the pharmacist by their PCP for assistance in managing diabetes and medication access.     Subjective:  Care Team: Primary Care Provider: [name], MD ; Next Scheduled Visit: not scheduled  Medication Access/Adherence  Current Pharmacy:  [facility] #[id number] [name], [place] - [address] AT [facility] OF [facility] & [address] [place] [address] Phone: [phone] Fax: [phone]    Patient reports affordability concerns with their medications: Yes  Patient reports access/transportation concerns to their pharmacy: No  Patient reports adherence concerns with their medications:  No    She is unable to afford Ozempic and [place] - she notes she used to get them through a grant but it ran out. Unclear if this was PAP or a grant.   Diabetes:  Current medications: Lantus 40 units daily, Metformin XR 750 mg daily, Farxiga 10 mg daily *Prescribed Ozempic, not currently taking due to cost  BG still in the 200s in the AM and PM  Received Farxiga from PAP Waiting on Ozempic and Tresiba shipment   Urinary Incontinence: Started oxybutynin, not noticing much improvement. Wants to wait longer to see if it helps longer term.  Objective:  Lab Results  Component Value Date   HGBA1C 10.9 Repeated and verified X2. (H) 09/29/2023    Lab Results  Component Value Date   CREATININE 0.87 09/29/2023   BUN 17 09/29/2023   NA 139 09/29/2023   K 4.7 09/29/2023   CL 104 09/29/2023   CO2 26 09/29/2023    Lab Results  Component Value Date   CHOL 148 09/29/2023   HDL 58.40 09/29/2023   LDLCALC 20 09/29/2023   LDLDIRECT 44.0 10/27/2022   TRIG 350.0 (H) 09/29/2023   CHOLHDL 3  09/29/2023    Medications Reviewed Today   Medications were not reviewed in this encounter       Assessment/Plan:   Diabetes: - Currently uncontrolled, Goal A1c <8% - Meets financial criteria for Ozempic and [place] patient assistance programs.  Confirmed Farxiga and Ozempic and Tresiba PAP were approved - waiting for shipments to arrive. - Recommended to call [employer] to see if she can get a voucher while waiting. Can also call for shipment updates.  Urinary incontinence/OAB: Recommend continuing Oxybutynin ER 5 mg daily - can increase to 10 mg if sx do not improve  Follow Up Plan: 3/25  Arbutus Leas, PharmD, BCPS, CPP Clinical Pharmacist Practitioner [place] [facility] at [facility] [phone]

## 2023-12-11 ENCOUNTER — Encounter: Payer: Self-pay | Admitting: Internal Medicine

## 2023-12-15 NOTE — Patient Instructions (Signed)
 It was a pleasure speaking with you today!    Feel free to call with any questions or concerns!  Arbutus Leas, PharmD, BCPS, CPP Clinical Pharmacist Practitioner [place] [facility] at [facility] [phone]

## 2023-12-16 ENCOUNTER — Telehealth: Payer: Self-pay

## 2023-12-16 NOTE — Telephone Encounter (Signed)
 Advised patient that her medication has been delivered and she can come in the office to pick it up.

## 2024-01-05 ENCOUNTER — Other Ambulatory Visit

## 2024-01-05 DIAGNOSIS — N3281 Overactive bladder: Secondary | ICD-10-CM

## 2024-01-05 NOTE — Progress Notes (Unsigned)
   01/05/2024 Name: JENNYLEE UEHARA MRN: [id number] DOB: 03/14/1951  Chief Complaint  Patient presents with   Diabetes   Medication Management    BASILIA STUCKERT is a 73 y.o. year old female who presented for a telephone visit.   They were referred to the pharmacist by their PCP for assistance in managing diabetes and medication access.   Subjective:  Care Team: Primary Care Provider: [name], MD ; Next Scheduled Visit: not scheduled  Medication Access/Adherence  Current Pharmacy:  [facility] #[id number] [name], [place] - [address] AT [facility] OF [facility] & [address] [place] [address] Phone: [phone] Fax: [phone]    Patient reports affordability concerns with their medications: Yes  Patient reports access/transportation concerns to their pharmacy: No  Patient reports adherence concerns with their medications:  No    Diabetes:  Current medications: Lantus 40 units daily, Metformin XR 750 mg daily, Farxiga 10 mg daily *Prescribed Ozempic, not currently taking due to cost  BG still in the 200s in the AM and PM  Received Farxiga, Tresiba and pen needles from PAP Waiting on Ozempic shipment   Urinary Incontinence: Started oxybutynin, has noticed some improvement but only sometimes.   Objective:  Lab Results  Component Value Date   HGBA1C 10.9 Repeated and verified X2. (H) 09/29/2023    Lab Results  Component Value Date   CREATININE 0.87 09/29/2023   BUN 17 09/29/2023   NA 139 09/29/2023   K 4.7 09/29/2023   CL 104 09/29/2023   CO2 26 09/29/2023    Lab Results  Component Value Date   CHOL 148 09/29/2023   HDL 58.40 09/29/2023   LDLCALC 20 09/29/2023   LDLDIRECT 44.0 10/27/2022   TRIG 350.0 (H) 09/29/2023   CHOLHDL 3 09/29/2023    Medications Reviewed Today   Medications were not reviewed in this encounter       Assessment/Plan:   Diabetes: - Currently uncontrolled, Goal A1c  <8% - Meets financial criteria for Ozempic and [place] patient assistance programs.  Confirmed Farxiga and Ozempic and Tresiba PAP were approved - waiting for Ozempic shipment to arrive.  - Reviewed that once she runs out of Lantus, she will take [place] in its place at the same dose.  Urinary incontinence/OAB: Increase oxybutynin to 10 mg daily  Follow Up Plan: 1 month  Arbutus Leas, PharmD, BCPS, CPP Clinical Pharmacist Practitioner [place] [facility] at [facility] [phone]

## 2024-01-06 MED ORDER — OXYBUTYNIN CHLORIDE ER 10 MG PO TB24: 10.0000 mg | ORAL_TABLET | Freq: Every day | ORAL | 0 refills | Status: DC

## 2024-01-06 NOTE — Patient Instructions (Signed)
 It was a pleasure speaking with you today!  Once you finish Lantus insulin, start Evaristo Bury in its place at the same dose.  We contacted [employer] and they confirmed your Ozempic is in process still so we are stilling waiting for the shipment to go out.  Feel free to call with any questions or concerns!  Arbutus Leas, PharmD, BCPS, CPP Clinical Pharmacist Practitioner [place] [facility] at [facility] [phone]

## 2024-01-19 ENCOUNTER — Ambulatory Visit

## 2024-01-19 DIAGNOSIS — Z7985 Long-term (current) use of injectable non-insulin antidiabetic drugs: Secondary | ICD-10-CM

## 2024-01-19 DIAGNOSIS — Z794 Long term (current) use of insulin: Secondary | ICD-10-CM

## 2024-01-19 DIAGNOSIS — E1122 Type 2 diabetes mellitus with diabetic chronic kidney disease: Secondary | ICD-10-CM

## 2024-01-19 DIAGNOSIS — N3281 Overactive bladder: Secondary | ICD-10-CM | POA: Diagnosis not present

## 2024-01-19 NOTE — Progress Notes (Signed)
 01/19/2024 Name: Abigail Collins MRN: [id number] DOB: [birth year]  Chief Complaint  Patient presents with   Diabetes   Medication Management   Over Active Bladder    Abigail Collins is a 73 y.o. year old female who presented for a telephone visit.   They were referred to the pharmacist by their PCP for assistance in managing diabetes and medication access.   Subjective:  Care Team: Primary Care Provider: [name], MD ; Next Scheduled Visit: not scheduled  Medication Access/Adherence  Current Pharmacy:  [facility] #[id number] [name], [place] - [address] AT [facility] OF [facility] & [address] [place] [address] Phone: [phone] Fax: [phone]    Patient reports affordability concerns with their medications: Yes  Patient reports access/transportation concerns to their pharmacy: No  Patient reports adherence concerns with their medications:  No    Diabetes:  Current medications: Lantus 40 units daily, Metformin XR 750 mg daily, Farxiga 10 mg daily *Prescribed Ozempic, not currently taking due to cost  BG still in the 200s in the AM and PM  Received Farxiga, Tresiba and pen needles from PAP Waiting on Ozempic shipment   Interested in seeing if she can get a [employer]  Urinary Incontinence: Currently taking oxybutynin 10 mg but it is not helping  Objective:  Lab Results  Component Value Date   HGBA1C 10.9 Repeated and verified X2. (H) 09/29/2023    Lab Results  Component Value Date   CREATININE 0.87 09/29/2023   BUN 17 09/29/2023   NA 139 09/29/2023   K 4.7 09/29/2023   CL 104 09/29/2023   CO2 26 09/29/2023    Lab Results  Component Value Date   CHOL 148 09/29/2023   HDL 58.40 09/29/2023   LDLCALC 20 09/29/2023   LDLDIRECT 44.0 10/27/2022   TRIG 350.0 (H) 09/29/2023   CHOLHDL 3 09/29/2023    Medications Reviewed Today     Reviewed by Bonita Quin, RPH (Pharmacist) on 01/19/24  at 1634  Med List Status: <None>   Medication Order Taking? Sig Documenting Provider Last Dose Status Informant  Accu-Chek Softclix Lancets lancets [id number]  Use to check blood glucose twice daily E11.65 T2DM [name], MD  Active   atorvastatin (LIPITOR) 20 MG tablet [id number]  TAKE 1 TABLET EVERY DAY [name], MD  Active   Blood Glucose Monitoring Suppl (ACCU-CHEK GUIDE ME) w/Device KIT [id number]  Use to check blood glucose twice daily E11.65 T2DM [name], MD  Active   dapagliflozin propanediol (FARXIGA) 10 MG TABS tablet [id number] Yes Take 1 tablet (10 mg total) by mouth daily before breakfast. [name], MD Taking Active            Med Note Camila Li, Dhara Schepp R   Tue Jan 19, 2024  4:34 PM) Through PAP  Glucagon (GVOKE HYPOPEN 2-PACK) 1 MG/0.2ML SOAJ [id number]  Inject 1 Act into the skin daily as needed. [name], MD  Active   glucose blood (ACCU-CHEK GUIDE) test strip [id number]  Use to check blood glucose twice daily E11.65 T2DM [name], MD  Active   insulin degludec (TRESIBA FLEXTOUCH) 100 UNIT/ML FlexTouch Pen [id number] Yes Inject 40 Units into the skin daily. Getting through PAP [name], MD Taking Active   Insulin Pen Needle (SURE COMFORT PEN NEEDLES) 32G X 4 MM MISC [id number]  Inject 1 Act into the skin 2 (two) times daily. Yetta Barre,  Bernadene Bell, MD  Active   irbesartan (AVAPRO) 300 MG tablet [id number]  TAKE 1 TABLET EVERY DAY [name], MD  Active   metFORMIN (GLUCOPHAGE) 1000 MG tablet [id number] Yes Take 1,000 mg by mouth 2 (two) times daily. [provider] Taking Active   omeprazole (PRILOSEC) 40 MG capsule [id number]  TAKE 1 CAPSULE EVERY DAY [name], MD  Active   oxybutynin (DITROPAN-XL) 10 MG 24 hr tablet [id number]  Take 1 tablet (10 mg total) by mouth at bedtime. [name], MD  Active   Semaglutide,0.25 or 0.5MG /DOS, (OZEMPIC, 0.25 OR 0.5 MG/DOSE,) 2 MG/3ML SOPN [id number] No Inject 0.25 mg into the skin once a  week.  Patient not taking: Reported on 01/19/2024   [name], MD Not Taking Active            Med Note Camila Li, Cassiopeia Florentino R   Mon Dec 07, 2023 11:53 AM) Waiting for it from PAP              Assessment/Plan:   Diabetes: - Currently uncontrolled, Goal A1c <8% - Meets financial criteria for Ozempic and [place] patient assistance programs.  Confirmed Farxiga and Ozempic and Tresiba PAP were approved - waiting for Ozempic shipment to arrive.  - Reviewed that once she runs out of Lantus, she will take [place] in its place at the same dose. - Sent Freestyle Libre 3 Plus to [place] to help get the device through her insurance  Urinary incontinence/OAB: Try Vesicare, stop oxybutynin   Follow Up Plan: 4/29  Arbutus Leas, PharmD, BCPS, CPP Clinical Pharmacist Practitioner [place] [facility] at [facility] [phone]

## 2024-01-22 MED ORDER — SOLIFENACIN SUCCINATE 5 MG PO TABS: 5.0000 mg | ORAL_TABLET | Freq: Every day | ORAL | 0 refills | Status: DC

## 2024-01-22 MED ORDER — FREESTYLE LIBRE 3 SENSOR MISC: 99 refills | Status: DC

## 2024-01-22 NOTE — Patient Instructions (Signed)
 It was a pleasure speaking with you today!  I have sent solifenacin for you to try instead of oxybutynin.  I have sent a prescription for the Freestyle Libre 3 to a pharmacy to help get it through insurance.  I am having someone check on your Ozempic status.  Feel free to call with any questions or concerns!  Arbutus Leas, PharmD, BCPS, CPP Clinical Pharmacist Practitioner [place] [facility] at [facility] [phone]

## 2024-01-25 ENCOUNTER — Telehealth: Payer: Self-pay

## 2024-01-25 NOTE — Telephone Encounter (Signed)
 Following up on pt PAP [employer] [facility]) per Eilene Grater [facility] pt was APPROVED January 2025 but pt has not received med,spoke with representative at [employer] said that they were NOT clear on the titration on the application,she was able to clarify the application and apoligize for not mailing out the med,they will be mail out to pt with in 7-10 days.Spoke with pt explain to her that they needed to clarify pap will be mailing out with in 7-10 days to Dr office pt is ok with receiving med with in the 10 days.

## 2024-01-26 ENCOUNTER — Ambulatory Visit: Admitting: Family

## 2024-01-27 NOTE — Telephone Encounter (Signed)
 Received APPORVAL letter from [employer] Ozempic 0.25 mg and 1 mg for year 2025. Spoke with pt to let her know med was mail out ,she will be receiving a call from Dr office soon.

## 2024-02-08 ENCOUNTER — Ambulatory Visit (INDEPENDENT_AMBULATORY_CARE_PROVIDER_SITE_OTHER): Payer: Medicare PPO

## 2024-02-08 ENCOUNTER — Other Ambulatory Visit

## 2024-02-08 VITALS — Ht 64.0 in | Wt 216.0 lb

## 2024-02-08 DIAGNOSIS — Z0000 Encounter for general adult medical examination without abnormal findings: Secondary | ICD-10-CM | POA: Diagnosis not present

## 2024-02-08 DIAGNOSIS — Z5320 Procedure and treatment not carried out because of patient's decision for unspecified reasons: Secondary | ICD-10-CM | POA: Diagnosis not present

## 2024-02-08 DIAGNOSIS — Z0100 Encounter for examination of eyes and vision without abnormal findings: Secondary | ICD-10-CM

## 2024-02-08 NOTE — Progress Notes (Signed)
 Subjective:   Abigail Collins is a 73 y.o. who presents for a Medicare Wellness preventive visit.  Visit Complete: Virtual I connected with  Abigail Collins on 02/08/24 by a audio enabled telemedicine application and verified that I am speaking with the correct person using two identifiers.  Patient Location: Home  Provider Location: Office/Clinic  I discussed the limitations of evaluation and management by telemedicine. The patient expressed understanding and agreed to proceed.  Vital Signs: Because this visit was a virtual/telehealth visit, some criteria may be missing or patient reported. Any vitals not documented were not able to be obtained and vitals that have been documented are patient reported.  VideoDeclined- This patient declined Librarian, academic. Therefore the visit was completed with audio only.  Persons Participating in Visit: Patient.  AWV Questionnaire: No: Patient Medicare AWV questionnaire was not completed prior to this visit.  Cardiac Risk Factors include: advanced age (>12men, >64 women);hypertension;dyslipidemia;diabetes mellitus;obesity (BMI >30kg/m2)     Objective:    Today's Vitals   02/08/24 1441  Weight: 216 lb (98 kg)  Height: 5\' 4"  (1.626 m)   Body mass index is 37.08 kg/m.     02/08/2024    2:39 PM 02/05/2023    3:13 PM 11/21/2022   12:53 PM 03/07/2022    2:04 PM 08/29/2020   12:35 PM 08/02/2018    8:27 PM 08/02/2018    5:59 AM  Advanced Directives  Does Patient Have a Medical Advance Directive? No No No No No No No  Would patient like information on creating a medical advance directive? Yes (MAU/Ambulatory/Procedural Areas - Information given) No - Patient declined No - Patient declined No - Patient declined Yes (MAU/Ambulatory/Procedural Areas - Information given) No - Patient declined No - Patient declined    Current Medications (verified) Outpatient Encounter Medications as of 02/08/2024  Medication Sig    Accu-Chek Softclix Lancets lancets Use to check blood glucose twice daily E11.65 T2DM   atorvastatin  (LIPITOR) 20 MG tablet TAKE 1 TABLET EVERY DAY   Blood Glucose Monitoring Suppl (ACCU-CHEK GUIDE ME) w/Device KIT Use to check blood glucose twice daily E11.65 T2DM   Continuous Glucose Sensor (FREESTYLE LIBRE 3 SENSOR) MISC Apply sensor every 15 days. Dx E11.22   dapagliflozin  propanediol (FARXIGA ) 10 MG TABS tablet Take 1 tablet (10 mg total) by mouth daily before breakfast.   Glucagon  (GVOKE HYPOPEN  2-PACK) 1 MG/0.2ML SOAJ Inject 1 Act into the skin daily as needed.   glucose blood (ACCU-CHEK GUIDE) test strip Use to check blood glucose twice daily E11.65 T2DM   insulin  degludec (TRESIBA  FLEXTOUCH) 100 UNIT/ML FlexTouch Pen Inject 40 Units into the skin daily. Getting through PAP   Insulin  Pen Needle (SURE COMFORT PEN NEEDLES) 32G X 4 MM MISC Inject 1 Act into the skin 2 (two) times daily.   irbesartan  (AVAPRO ) 300 MG tablet TAKE 1 TABLET EVERY DAY   metFORMIN  (GLUCOPHAGE ) 1000 MG tablet Take 1,000 mg by mouth 2 (two) times daily.   omeprazole  (PRILOSEC) 40 MG capsule TAKE 1 CAPSULE EVERY DAY   Semaglutide ,0.25 or 0.5MG /DOS, (OZEMPIC , 0.25 OR 0.5 MG/DOSE,) 2 MG/3ML SOPN Inject 0.25 mg into the skin once a week.   solifenacin  (VESICARE ) 5 MG tablet Take 1 tablet (5 mg total) by mouth daily.   No facility-administered encounter medications on file as of 02/08/2024.    Allergies (verified) Lisinopril, Amlodipine , Tape, Morphine  and codeine, and Percocet [oxycodone -acetaminophen ]   History: Past Medical History:  Diagnosis Date   Anemia  hx of   Anginal pain (HCC)    "left arm pain, sees Dr. Belinda Bowler, had card cath 2013"   Arthritis    "all over" (09/14/2012)   Asthma    takes inhaler 2x day   Bronchitis    hx of   Bulging disc    "lower"   Carpal tunnel syndrome of left wrist    Depression    denies   Exertional dyspnea    "sometimes laying down" (09/14/2012)   Headache     frequent headaches,usually if she does not eat   Hyperlipidemia    Hypertension    sees Dr. Dorothea Gata, primary   Pneumonia 03/2012   PONV (postoperative nausea and vomiting)    Thyroid  disease 1960's   "don't have it now" (09/14/2012)   Type II diabetes mellitus (HCC)    Urinary tract infection    hx of   Vomiting    pt states she vomits every am   Past Surgical History:  Procedure Laterality Date   CARDIAC CATHETERIZATION  05/13/2012   mod luminal irregularity of pLAD, no sign CAD, EF 65%.   CHOLECYSTECTOMY  1970's   KYPHOPLASTY N/A 02/10/2013   Procedure:  LUMBAR TWO KYPHOPLASTY;  Surgeon: Elder Greening, MD;  Location: [facility];  Service: Neurosurgery;  Laterality: N/A;  L2 Kyphoplasty; Will use Stern's Carm.   ORIF HUMERUS FRACTURE Right 01/06/2018   Procedure: OPEN REDUCTION INTERNAL FIXATION (ORIF) RIGHT PROXIMAL HUMERUS FRACTURE;  Surgeon: Wes Hamman, MD;  Location: [facility] OR;  Service: Orthopedics;  Laterality: Right;   TOTAL ELBOW REPLACEMENT  ~ 2005   "right" (09/14/2012)   TOTAL KNEE ARTHROPLASTY  09/13/2012   Procedure: TOTAL KNEE ARTHROPLASTY;  Surgeon: Christie Cox, MD;  Location: [facility];  Service: Orthopedics;  Laterality: Right;   TOTAL KNEE ARTHROPLASTY Left 08/02/2018   Procedure: LEFT TOTAL KNEE ARTHROPLASTY;  Surgeon: Wes Hamman, MD;  Location: [facility];  Service: Orthopedics;  Laterality: Left;   TUBAL LIGATION  1970's   VAGINAL HYSTERECTOMY  1970's   Family History  Problem Relation Age of Onset   Arthritis Mother    Arthritis Father    Hypertension Father    Diabetes Father    Colon cancer Neg Hx    Social History   Socioeconomic History   Marital status: Married    Spouse name: Not on file   Number of children: Not on file   Years of education: Not on file   Highest education level: Not on file  Occupational History   Not on file  Tobacco Use   Smoking status: Former    Current packs/day: 0.00    Average packs/day: 0.3 packs/day for 20.0  years (5.0 ttl pk-yrs)    Types: Cigarettes    Start date: 10/13/1989    Quit date: 10/13/2009    Years since quitting: 14.3    Passive exposure: Past   Smokeless tobacco: Never  Vaping Use   Vaping status: Never Used  Substance and Sexual Activity   Alcohol use: No    Comment: 09/14/2012 "did drink a little in my younger days"   Drug use: No   Sexual activity: Not Currently    Partners: Female  Other Topics Concern   Not on file  Social History Narrative   Not on file   Social Drivers of Health   Financial Resource Strain: Low Risk  (02/08/2024)   Overall Financial Resource Strain (CARDIA)    Difficulty of Paying Living Expenses: Not  hard at all  Food Insecurity: No Food Insecurity (02/08/2024)   Hunger Vital Sign    Worried About Running Out of Food in the Last Year: Never true    Ran Out of Food in the Last Year: Never true  Transportation Needs: No Transportation Needs (02/08/2024)   PRAPARE - Administrator, Civil Service (Medical): No    Lack of Transportation (Non-Medical): No  Physical Activity: Insufficiently Active (02/08/2024)   Exercise Vital Sign    Days of Exercise per Week: 2 days    Minutes of Exercise per Session: 20 min  Stress: No Stress Concern Present (02/08/2024)   [employer] of Occupational Health - Occupational Stress Questionnaire    Feeling of Stress : Not at all  Social Connections: Moderately Isolated (02/08/2024)   Social Connection and Isolation Panel [NHANES]    Frequency of Communication with Friends and Family: More than three times a week    Frequency of Social Gatherings with Friends and Family: Never    Attends Religious Services: Never    Database administrator or Organizations: No    Attends Engineer, structural: Never    Marital Status: Married    Tobacco Counseling Counseling given: No    Clinical Intake:  Pre-visit preparation completed: Yes  Pain : No/denies pain     BMI - recorded:  37.08 Nutritional Status: BMI > 30  Obese Nutritional Risks: None Diabetes: Yes CBG done?: Yes CBG resulted in Enter/ Edit results?: Yes (fasting - 216) Did pt. bring in CBG monitor from home?: No  Lab Results  Component Value Date   HGBA1C 10.9 Repeated and verified X2. (H) 09/29/2023   HGBA1C 8.5 (H) 02/09/2023   HGBA1C 10.0 (H) 10/27/2022     How often do you need to have someone help you when you read instructions, pamphlets, or other written materials from your doctor or pharmacy?: 1 - Never  Interpreter Needed?: No  Information entered by :: Kandy Orris, CMA   Activities of Daily Living     02/08/2024    2:50 PM  In your present state of health, do you have any difficulty performing the following activities:  Hearing? 0  Vision? 0  Difficulty concentrating or making decisions? 0  Walking or climbing stairs? 0  Dressing or bathing? 0  Doing errands, shopping? 0  Preparing Food and eating ? N  Using the Toilet? N  In the past six months, have you accidently leaked urine? Y  Comment has urgency - referral in process to Urologist  Do you have problems with loss of bowel control? N  Managing your Medications? N  Managing your Finances? N  Housekeeping or managing your Housekeeping? N    Patient Care Team: Arcadio Knuckles, MD as PCP - General (Internal Medicine) Devon Fogo, MD (Inactive) as Consulting Physician (Dermatology) Dion Frankel, [facility] (Pharmacist)  Indicate any recent Medical Services you may have received from other than [facility] providers in the past year (date may be approximate).     Assessment:   This is a routine wellness examination for Ethelda.  Hearing/Vision screen Hearing Screening - Comments:: Denies hearing difficulties   Vision Screening - Comments:: Wears rx glasses - referral to an Opthalmologist   Goals Addressed               This Visit's Progress     Weight (lb) < 200 lb (90.7 kg) (pt-stated)   216 lb (98 kg)  Patient stated she wants to lose weight and gain better control of blood sugar levels..Lose about 50lbs.       Depression Screen     02/08/2024    2:54 PM 06/09/2023    8:12 AM 02/05/2023    3:23 PM 03/07/2022    2:09 PM 01/24/2021   11:10 AM 08/29/2020   12:00 PM 10/31/2019    2:18 PM  PHQ 2/9 Scores  PHQ - 2 Score 0 2 2 0 0 0 0  PHQ- 9 Score 2 6 2         Fall Risk     02/08/2024    2:49 PM 06/09/2023    8:12 AM 02/05/2023    3:06 PM 03/07/2022    2:05 PM 09/27/2021   10:30 AM  Fall Risk   Falls in the past year? 0 1 0 0 0  Number falls in past yr: 0 0 0 0 0  Injury with Fall? 0 1 0 0 0  Comment     N/A- no falls reported  Risk for fall due to : No Fall Risks History of fall(s) No Fall Risks No Fall Risks History of fall(s);Impaired balance/gait  Follow up Falls prevention discussed;Falls evaluation completed Falls evaluation completed Falls prevention discussed Falls evaluation completed Falls prevention discussed    MEDICARE RISK AT HOME:  Medicare Risk at Home Any stairs in or around the home?: No If so, are there any without handrails?: No Home free of loose throw rugs in walkways, pet beds, electrical cords, etc?: Yes Adequate lighting in your home to reduce risk of falls?: Yes Life alert?: No Use of a cane, walker or w/c?: No Grab bars in the bathroom?: Yes Shower chair or bench in shower?: No Elevated toilet seat or a handicapped toilet?: Yes  TIMED UP AND GO:  Was the test performed?  No  Cognitive Function: 6CIT completed        02/08/2024    2:54 PM 02/05/2023    3:20 PM 03/07/2022    2:20 PM  6CIT Screen  What Year? 0 points 0 points 0 points  What month? 0 points 0 points 0 points  What time? 0 points 0 points 0 points  Count back from 20 0 points 0 points 0 points  Months in reverse 0 points 0 points 0 points  Repeat phrase 0 points 0 points 0 points  Total Score 0 points 0 points 0 points    Immunizations Immunization History  Administered  Date(s) Administered   Fluad Quad(high Dose 65+) 07/08/2019, 08/27/2021   Fluad Trivalent(High Dose 65+) 06/09/2023   Influenza Split 08/05/2012   Influenza, High Dose Seasonal PF 07/02/2017, 07/02/2018, 07/22/2022   Influenza,inj,Quad PF,6+ Mos 09/03/2015, 07/29/2016   PFIZER(Purple Top)SARS-COV-2 Vaccination 12/11/2019, 01/10/2020   Pneumococcal Conjugate-13 10/31/2019   Pneumococcal Polysaccharide-23 04/06/2012, 08/05/2018   Tdap 10/31/2019    Screening Tests Health Maintenance  Topic Date Due   Zoster Vaccines- Shingrix  (1 of 2) Never done   COVID-19 Vaccine (3 - Pfizer risk series) 02/07/2020   OPHTHALMOLOGY EXAM  01/19/2024   MAMMOGRAM  04/11/2024 (Originally 02/26/2002)   HEMOGLOBIN A1C  03/29/2024   INFLUENZA VACCINE  05/13/2024   Diabetic kidney evaluation - eGFR measurement  09/28/2024   Diabetic kidney evaluation - Urine ACR  09/28/2024   FOOT EXAM  09/28/2024   Medicare Annual Wellness (AWV)  02/07/2025   Colonoscopy  07/10/2025   DTaP/Tdap/Td (2 - Td or Tdap) 10/30/2029   Pneumonia Vaccine 60+ Years old  Completed   DEXA SCAN  Completed   Hepatitis C Screening  Completed   HPV VACCINES  Aged Out   Meningococcal B Vaccine  Aged Out    Health Maintenance  Health Maintenance Due  Topic Date Due   Zoster Vaccines- Shingrix  (1 of 2) Never done   COVID-19 Vaccine (3 - Pfizer risk series) 02/07/2020   OPHTHALMOLOGY EXAM  01/19/2024   Health Maintenance Items Addressed: Referral sent to Optometry/Ophthalmology, Pt declined to have a Mammogram in 2025.  Additional Screening:  Vision Screening: Recommended annual ophthalmology exams for early detection of glaucoma and other disorders of the eye.  Dental Screening: Recommended annual dental exams for proper oral hygiene  Community Resource Referral / Chronic Care Management: CRR required this visit?  No   CCM required this visit?  No     Plan:     I have personally reviewed and noted the following in the  patient's chart:   Medical and social history Use of alcohol, tobacco or illicit drugs  Current medications and supplements including opioid prescriptions. Patient is not currently taking opioid prescriptions. Functional ability and status Nutritional status Physical activity Advanced directives List of other physicians Hospitalizations, surgeries, and ER visits in previous 12 months Vitals Screenings to include cognitive, depression, and falls Referrals and appointments  In addition, I have reviewed and discussed with patient certain preventive protocols, quality metrics, and best practice recommendations. A written personalized care plan for preventive services as well as general preventive health recommendations were provided to patient.     Patria Bookbinder, CMA   02/08/2024   After Visit Summary: (MyChart) Due to this being a telephonic visit, the after visit summary with patients personalized plan was offered to patient via MyChart   Notes: Nothing significant to report at this time.

## 2024-02-08 NOTE — Patient Instructions (Addendum)
 Abigail Collins , Thank you for taking time to come for your Medicare Wellness Visit. I appreciate your ongoing commitment to your health goals. Please review the following plan we discussed and let me know if I can assist you in the future.   Referrals/Orders/Follow-Ups/Clinician Recommendations: Aim for 30 minutes of exercise or brisk walking, 6-8 glasses of water , and 5 servings of fruits and vegetables each day. Educated and advised on getting a Screening Mammogram, COVID and Shingles vaccines in 2025.  This is a list of the screening recommended for you and due dates:  Health Maintenance  Topic Date Due   Zoster (Shingles) Vaccine (1 of 2) Never done   COVID-19 Vaccine (3 - Pfizer risk series) 02/07/2020   Eye exam for diabetics  01/19/2024   Mammogram  04/11/2024*   Hemoglobin A1C  03/29/2024   Flu Shot  05/13/2024   Yearly kidney function blood test for diabetes  09/28/2024   Yearly kidney health urinalysis for diabetes  09/28/2024   Complete foot exam   09/28/2024   Medicare Annual Wellness Visit  02/07/2025   Colon Cancer Screening  07/10/2025   DTaP/Tdap/Td vaccine (2 - Td or Tdap) 10/30/2029   Pneumonia Vaccine  Completed   DEXA scan (bone density measurement)  Completed   Hepatitis C Screening  Completed   HPV Vaccine  Aged Out   Meningitis B Vaccine  Aged Out  *Topic was postponed. The date shown is not the original due date.    Advanced directives: (Provided) Advance directive discussed with you today. I have provided a copy for you to complete at home and have notarized. Once this is complete, please bring a copy in to our office so we can scan it into your chart.   Next Medicare Annual Wellness Visit scheduled for next year: Yes

## 2024-02-09 ENCOUNTER — Telehealth: Payer: Self-pay

## 2024-02-09 ENCOUNTER — Ambulatory Visit (INDEPENDENT_AMBULATORY_CARE_PROVIDER_SITE_OTHER): Admitting: Pharmacist

## 2024-02-09 DIAGNOSIS — E1122 Type 2 diabetes mellitus with diabetic chronic kidney disease: Secondary | ICD-10-CM

## 2024-02-09 DIAGNOSIS — N182 Chronic kidney disease, stage 2 (mild): Secondary | ICD-10-CM

## 2024-02-09 DIAGNOSIS — Z794 Long term (current) use of insulin: Secondary | ICD-10-CM

## 2024-02-09 DIAGNOSIS — N3281 Overactive bladder: Secondary | ICD-10-CM | POA: Diagnosis not present

## 2024-02-09 MED ORDER — SOLIFENACIN SUCCINATE 5 MG PO TABS: 5.0000 mg | ORAL_TABLET | Freq: Every day | ORAL | 0 refills | Status: DC

## 2024-02-09 NOTE — Telephone Encounter (Signed)
 Patient has been made aware that her medication is here in the office. She has an appointment today and states that she will pick it up then. Her OZEMPIC  has been placed in the immunization fridge.

## 2024-02-09 NOTE — Progress Notes (Signed)
 02/09/2024 Name: Abigail Collins MRN: [id number] DOB: 1951-05-10  Chief Complaint  Patient presents with   Diabetes   Medication Management    IYSIS ABEGG is a 73 y.o. year old female who presented for a telephone visit.   They were referred to the pharmacist by their PCP for assistance in managing diabetes and medication access.   Subjective:  Care Team: Primary Care Provider: Arcadio Knuckles, MD ; Next Scheduled Visit: not scheduled  Medication Access/Adherence  Current Pharmacy:  [facility] #[id number] [place], [place] - [address] AT [facility] & [address] [place] [address] Phone: [phone] Fax: [phone]    Patient reports affordability concerns with their medications: Yes  Patient reports access/transportation concerns to their pharmacy: No  Patient reports adherence concerns with their medications:  No    Diabetes:  Current medications: Lantus  40 units daily, Metformin  XR 750 mg daily, Farxiga  10 mg daily, Ozempic  0.5 mg weekly  BG still in the 200s in the AM and PM  Received Farxiga , Tresiba  and pen needles from PAP Ozempic  shipment arrived today  Interested in seeing if she can get a [employer] - she notes she is supposed to start getting Medicaid in August so wants to wait until then  Urinary Incontinence: Has not started vesiare - has not received from the pharmacy  Objective:  Lab Results  Component Value Date   HGBA1C 10.9 Repeated and verified X2. (H) 09/29/2023    Lab Results  Component Value Date   CREATININE 0.87 09/29/2023   BUN 17 09/29/2023   NA 139 09/29/2023   K 4.7 09/29/2023   CL 104 09/29/2023   CO2 26 09/29/2023    Lab Results  Component Value Date   CHOL 148 09/29/2023   HDL 58.40 09/29/2023   LDLCALC 20 09/29/2023   LDLDIRECT 44.0 10/27/2022   TRIG 350.0 (H) 09/29/2023   CHOLHDL 3 09/29/2023    Medications Reviewed Today     Reviewed by  Dion Frankel, RPH (Pharmacist) on 02/09/24 at 1628  Med List Status: <None>   Medication Order Taking? Sig Documenting Provider Last Dose Status Informant  Accu-Chek Softclix Lancets lancets [id number]  Use to check blood glucose twice daily E11.65 T2DM Arcadio Knuckles, MD  Active   atorvastatin  (LIPITOR) 20 MG tablet [id number]  TAKE 1 TABLET EVERY DAY Arcadio Knuckles, MD  Active   Blood Glucose Monitoring Suppl (ACCU-CHEK GUIDE ME) w/Device KIT [id number]  Use to check blood glucose twice daily E11.65 T2DM Arcadio Knuckles, MD  Active   Continuous Glucose Sensor (FREESTYLE LIBRE 3 SENSOR) [place] [id number]  Apply sensor every 15 days. Dx E11.22 Arcadio Knuckles, MD  Active   dapagliflozin  propanediol (FARXIGA ) 10 MG TABS tablet [id number] Yes Take 1 tablet (10 mg total) by mouth daily before breakfast. Arcadio Knuckles, MD Taking Active            Med Note Lolly Riser, Jazzmin Newbold R   Tue Jan 19, 2024  4:34 PM) Through [facility]  Glucagon  (GVOKE HYPOPEN  2-PACK) 1 MG/0.2ML SOAJ [id number]  Inject 1 Act into the skin daily as needed. Arcadio Knuckles, MD  Active   glucose blood (ACCU-CHEK GUIDE) test strip [id number]  Use to check blood glucose twice daily E11.65 T2DM Arcadio Knuckles, MD  Active   insulin  degludec (TRESIBA  FLEXTOUCH) 100 UNIT/ML FlexTouch Pen [id number]  Inject 40 Units into the skin daily. Getting through  PAP Arcadio Knuckles, MD  Active   Insulin  Pen Needle (SURE COMFORT PEN NEEDLES) 32G X 4 MM MISC [id number]  Inject 1 Act into the skin 2 (two) times daily. Arcadio Knuckles, MD  Active   irbesartan  (AVAPRO ) 300 MG tablet [id number]  TAKE 1 TABLET EVERY DAY Arcadio Knuckles, MD  Active   metFORMIN  (GLUCOPHAGE ) 1000 MG tablet [id number]  Take 1,000 mg by mouth 2 (two) times daily. [provider]  Active   omeprazole  (PRILOSEC) 40 MG capsule [id number]  TAKE 1 CAPSULE EVERY DAY Arcadio Knuckles, MD  Active   Semaglutide ,0.25 or 0.5MG /DOS, (OZEMPIC , 0.25 OR 0.5 MG/DOSE,) 2 MG/3ML SOPN [id number] Yes  Inject 0.25 mg into the skin once a week.  Patient taking differently: Inject 0.5 mg into the skin once a week.   Arcadio Knuckles, MD Taking Active            Med Note Lolly Riser, Preethi Scantlebury R   Mon Dec 07, 2023 11:53 AM) Waiting for it from [facility]  solifenacin  (VESICARE ) 5 MG tablet [id number]  Take 1 tablet (5 mg total) by mouth daily. Arcadio Knuckles, MD  Active               Assessment/Plan:   Diabetes: - Currently uncontrolled, Goal A1c <8% - Meets financial criteria for Ozempic  and Farxiga  patient assistance programs.  Confirmed Farxiga  and Ozempic  and Tresiba  PAP were approved - picked up Ozempic  today - Reviewed that once she runs out of Lantus , she will take Tresiba  in its place at the same dose. - Send Freestyle Libre 3 in August since it will be $0 with Medicaid  Urinary incontinence/OAB: Sent [facility]icare  to [facility] per pt requst  Follow Up Plan: July - need A1c  Rainelle Bur, PharmD, BCPS, CPP Clinical Pharmacist Practitioner [facility] [phone]

## 2024-02-13 IMAGING — DX DG LUMBAR SPINE COMPLETE 4+V
5 series · 5 of 5 positions shown · non-contrast
Comparison: X-ray lumbar 03/08/2013; MR lumbar 10/30/2012.

CLINICAL DATA: Worsening low back pain for 6 months. History of
lumbar surgery.

EXAM:
LUMBAR SPINE - COMPLETE 4+ VIEW

[l-spine ap]
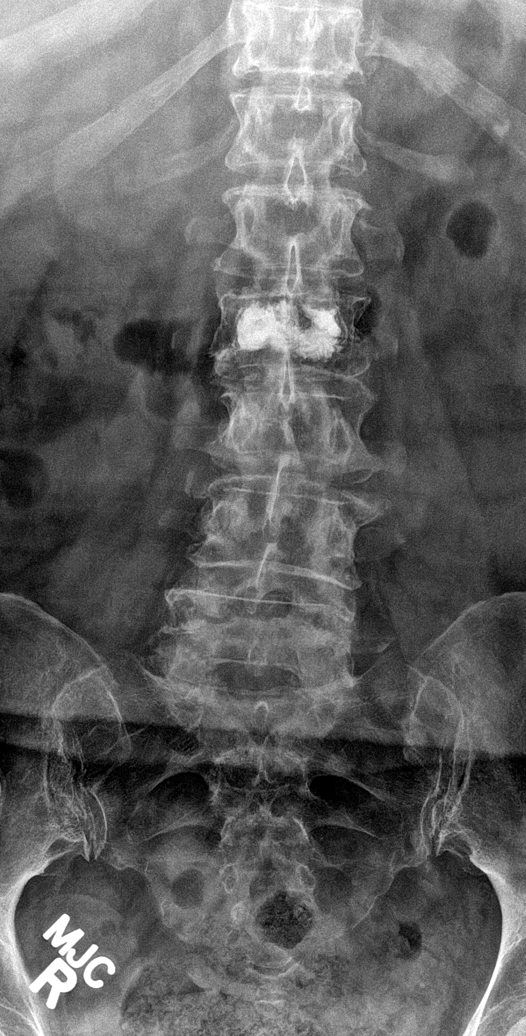

[l-spine obl (1 of 2)]
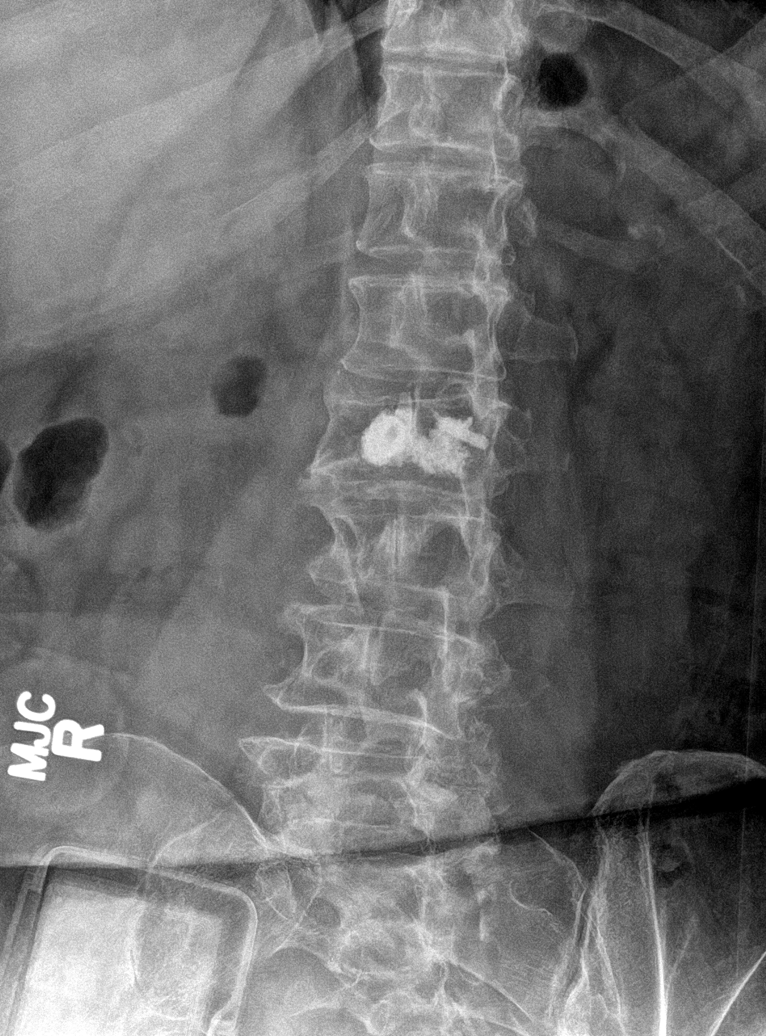

[l-spine obl (2 of 2)]
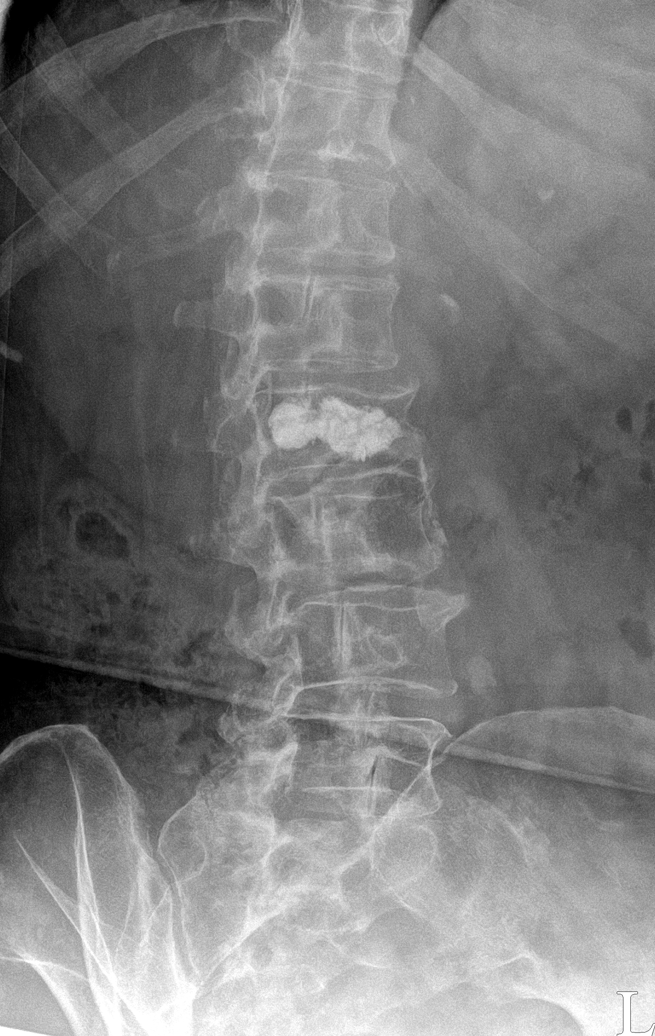

[l-spine lateral]
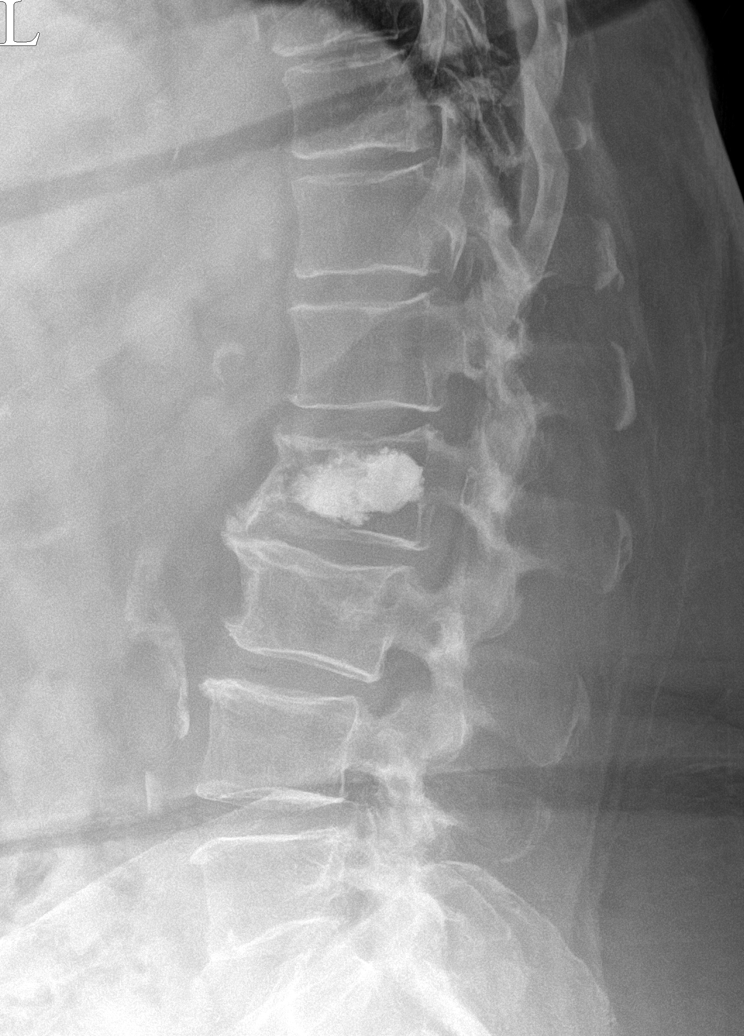

[l-spine spot]
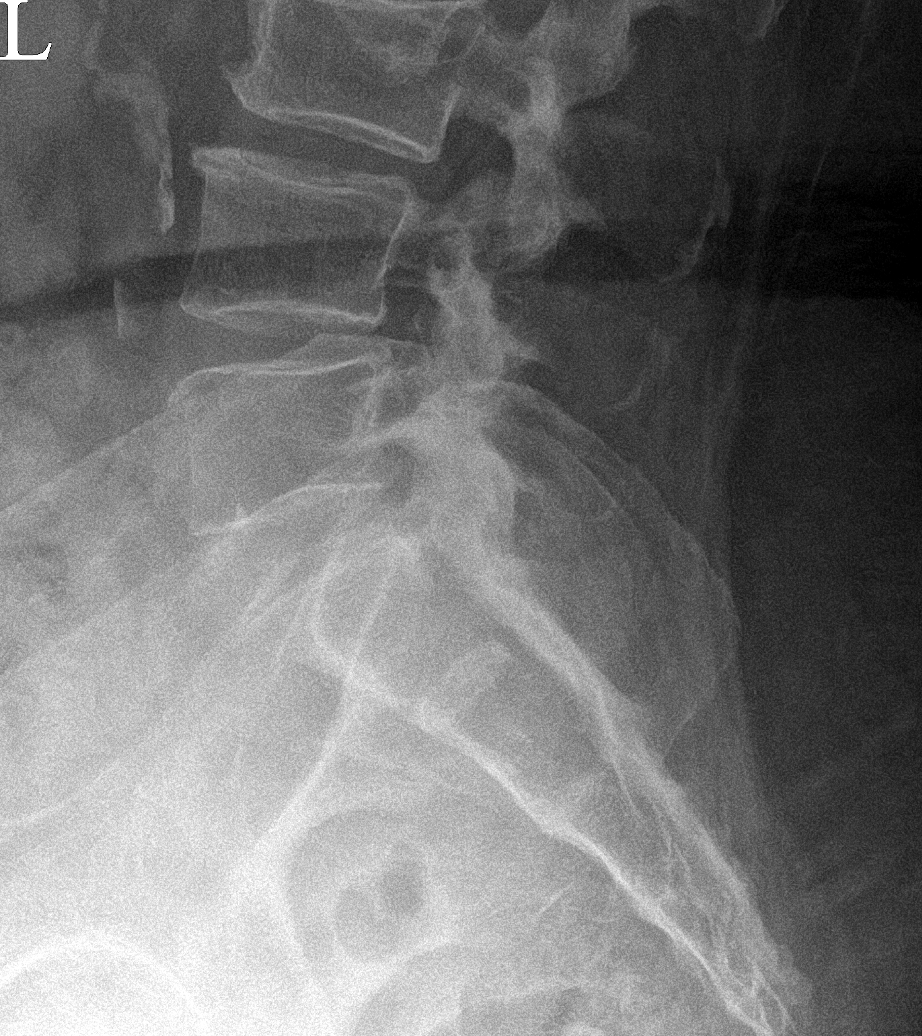

[5 of 5 positions shown; findings below may reference images not displayed]

FINDINGS: Minimal scoliosis convex to the left with the apex at L3. Distant
vertebral augmentation at L2 for treatment of a minor compression
fracture. No progression over time. Chronic central depression of
the superior endplate of L3, unchanged. No other regional fracture.
Disc space heights are normal except for mild narrowing at L2-3.
Mild lower lumbar facet osteoarthritis. No other focal finding.
IMPRESSION: No acute finding. Mild scoliotic curvature convex to the left. Old
augmented fracture at L2 without progression. Mild lumbar
degenerative disc disease and degenerative facet disease.

## 2024-02-19 ENCOUNTER — Other Ambulatory Visit: Payer: Self-pay | Admitting: Internal Medicine

## 2024-02-19 NOTE — Telephone Encounter (Unsigned)
 Copied from CRM [id number]. Topic: Clinical - Medication Refill >> Feb 19, 2024  9:06 AM Albertha Alosa wrote: Medication: metFORMIN  (GLUCOPHAGE ) 1000 MG tablet  Has the patient contacted their pharmacy? Yes (Agent: If no, request that the patient contact the pharmacy for the refill. If patient does not wish to contact the pharmacy document the reason why and proceed with request.) (Agent: If yes, when and what did the pharmacy advise?)  This is the patient's preferred pharmacy:  [facility] #[id number] [place], [place] - [phone]  [facility] - [place], [place] - [address] [place] [address] Phone: [phone] Fax: [phone]   Is this the correct pharmacy for this prescription? Yes If no, delete pharmacy and type the correct one.   Has the prescription been filled recently? No  Is the patient out of the medication? Yes  Has the patient been seen for an appointment in the last year OR does the patient have an upcoming appointment? Yes  Can we respond through MyChart? Yes  Agent: Please be advised that Rx refills may take up to 3 business days. We ask that you follow-up with your pharmacy.

## 2024-02-20 MED ORDER — METFORMIN HCL 1000 MG PO TABS: 1000.0000 mg | ORAL_TABLET | Freq: Two times a day (BID) | ORAL | 0 refills | Status: DC

## 2024-03-04 ENCOUNTER — Telehealth: Payer: Self-pay

## 2024-03-04 DIAGNOSIS — K219 Gastro-esophageal reflux disease without esophagitis: Secondary | ICD-10-CM

## 2024-03-04 DIAGNOSIS — E785 Hyperlipidemia, unspecified: Secondary | ICD-10-CM

## 2024-03-04 DIAGNOSIS — I10 Essential (primary) hypertension: Secondary | ICD-10-CM

## 2024-03-04 MED ORDER — OMEPRAZOLE 40 MG PO CPDR: 40.0000 mg | DELAYED_RELEASE_CAPSULE | Freq: Every day | ORAL | 0 refills | Status: DC

## 2024-03-04 MED ORDER — IRBESARTAN 300 MG PO TABS: 300.0000 mg | ORAL_TABLET | Freq: Every day | ORAL | 0 refills | Status: DC

## 2024-03-04 MED ORDER — ATORVASTATIN CALCIUM 20 MG PO TABS: 20.0000 mg | ORAL_TABLET | Freq: Every day | ORAL | 0 refills | Status: DC

## 2024-03-04 NOTE — Telephone Encounter (Signed)
 This patient is appearing on a report for being at risk of failing the adherence measure for cholesterol (statin), diabetes, and hypertension (ACEi/ARB) medications this calendar year.   Medication: irbesartan  300 mg Last fill date: 10/20/23 for 90 day supply  Medication: atorvastatin  20 mg Last fill date: 10/20/23 for 90 day supply  Medication: metformin  1000 mg Last fill date: 10/20/23 for 90 day supply  Spoke with patient who stated she needs refill of irbesartan  and atorvastatin  sent to [facility]. She notes she has almost a full bottle of metformin  she's currently using and then has 2 extra bottles. Confirmed patient is taking it twice daily. States she may miss a dose of her medications 2-3x/month. Will collaborate with embedded pharmacist to send refills.   Patient also requesting refill of omeprazole .  Abelina Abide, PharmD PGY1 Pharmacy Resident 03/04/2024 3:04 PM

## 2024-03-04 NOTE — Telephone Encounter (Signed)
 Sent refills for atorvastatin , irbesartan , and omeprazole . Patient will need to schedule follow up with PCP for next month in order to continue getting refills.  Rainelle Bur, PharmD, BCPS, CPP Clinical Pharmacist Practitioner [facility] [phone]

## 2024-03-22 ENCOUNTER — Telehealth: Payer: Self-pay

## 2024-03-22 NOTE — Telephone Encounter (Signed)
 Called and let Pt know her Tresiba  has arrived in the office and is ready for pick up, it has been placed in the nurse station fridge.

## 2024-04-20 ENCOUNTER — Telehealth: Payer: Self-pay | Admitting: Pharmacy Technician

## 2024-04-20 NOTE — Progress Notes (Signed)
   04/20/2024 Name: [name] MRN: [id number] DOB: 22-Oct-1950  Patient is appearing on a report for [employer] [place] Metric Diabetes and last engaged with the clinical pharmacist to discuss diabetes on 02/09/2024. Contacted patient today to discuss diabetes management and completed medication review.   Diabetes Plan from last clinical pharmacist appointment: - Currently uncontrolled, Goal A1c <8% - Meets financial criteria for Ozempic  and Farxiga  patient assistance programs.  Confirmed Farxiga  and Ozempic  and Tresiba  PAP were approved - picked up Ozempic  today - Reviewed that once she runs out of Lantus , she will take Tresiba  in its place at the same dose. - Send Freestyle Libre 3 in August since it will be $0 with Medicaid Follow Up Plan: July - need A1c    Medication Adherence Barriers Identified:  Patient made recommended medication changes per plan: Yes Patient informs she is currently using Ozempic  1mg  every week, Tresiba  40 units daily, Farxiga  10mg  daily and Metformin  1000mg  twice a day. Patient informs she stopped Lantus .  Patient is checking blood sugars as prescribed: Yes Patient informs she is checking her blood sugar but has not checked it today. She informs it usually runs in the 230s but no readings over 300. Patient is supposed to be getting a new insurance in August and would like to try the [employer] 3 at that time as cost should be significantly lowered. Patient is hoping that since Ozempic  was increased her blood sugars will start to trend down.   Patient informs she increased Ozempic  to 1mg  last week. She is tolerating it with out any side effects  Patient receives Farxiga , Tresiba  and Ozempic  from PAP. Per Dr Annemarie, patient last received 90 day supply of Metformin  on 510/2025.  Patient informs she has had a spot come up on the back of her leg. She has been treating it with a war removal for about 3 days. She informs she has an appointment with PCP on 04/27/24 and  patient was advised to talk to provider about the spot. A message will aslo be sent to PharmD. Medication Adherence Barriers Addressed/Actions Taken:  Reviewed medication changes per plan from last clinical pharmacist note  Educated patient to contact pharmacy regarding new prescriptions/refils  Patient Assistance Program approved for Tresiba , Ozempic  and Farxiga .  Reviewed instructions for monitoring blood sugars at home and reminded patient to keep a written log to review with pharmacist Reminded patient of date/time of upcoming clinical pharmacist follow up and any upcoming PCP/specialists visits. Patient denies transportation barriers to the appointment. Yes  Next clinical pharmacist appointment is scheduled for: TBD  Kate Caddy, CPhT [facility] Office: [phone] Email: [email or web address]

## 2024-04-27 ENCOUNTER — Ambulatory Visit: Admitting: Internal Medicine

## 2024-05-13 ENCOUNTER — Other Ambulatory Visit (HOSPITAL_COMMUNITY)
Admission: RE | Admit: 2024-05-13 | Discharge: 2024-05-13 | Disposition: A | Discharge status: Home / Self Care | Source: Ambulatory Visit | Attending: Obstetrics | Admitting: Obstetrics

## 2024-05-13 ENCOUNTER — Encounter: Payer: Self-pay | Admitting: Obstetrics

## 2024-05-13 ENCOUNTER — Ambulatory Visit (INDEPENDENT_AMBULATORY_CARE_PROVIDER_SITE_OTHER): Admitting: Obstetrics

## 2024-05-13 VITALS — BP 133/81 | HR 91 | Ht 60.63 in | Wt 210.0 lb

## 2024-05-13 DIAGNOSIS — R351 Nocturia: Secondary | ICD-10-CM | POA: Diagnosis not present

## 2024-05-13 DIAGNOSIS — N952 Postmenopausal atrophic vaginitis: Secondary | ICD-10-CM | POA: Diagnosis not present

## 2024-05-13 DIAGNOSIS — L292 Pruritus vulvae: Secondary | ICD-10-CM | POA: Insufficient documentation

## 2024-05-13 DIAGNOSIS — Z6841 Body Mass Index (BMI) 40.0 and over, adult: Secondary | ICD-10-CM | POA: Diagnosis not present

## 2024-05-13 DIAGNOSIS — N898 Other specified noninflammatory disorders of vagina: Secondary | ICD-10-CM | POA: Insufficient documentation

## 2024-05-13 DIAGNOSIS — N3281 Overactive bladder: Secondary | ICD-10-CM | POA: Diagnosis not present

## 2024-05-13 LAB — POCT URINALYSIS DIP (CLINITEK)
Bilirubin, UA: NEGATIVE
Blood, UA: NEGATIVE
Glucose, UA: 500 mg/dL — AB
Ketones, POC UA: NEGATIVE mg/dL
Leukocytes, UA: NEGATIVE
Nitrite, UA: NEGATIVE
POC PROTEIN,UA: NEGATIVE
Spec Grav, UA: 1.025 (ref 1.010–1.025)
Urobilinogen, UA: 0.2 U/dL
pH, UA: 5.5 (ref 5.0–8.0)

## 2024-05-13 MED ORDER — ESTRADIOL 0.1 MG/GM VA CREA: TOPICAL_CREAM | VAGINAL | 3 refills | Status: AC

## 2024-05-13 MED ORDER — TROSPIUM CHLORIDE 20 MG PO TABS: 20.0000 mg | ORAL_TABLET | Freq: Two times a day (BID) | ORAL | 0 refills | Status: AC

## 2024-05-13 NOTE — Assessment & Plan Note (Signed)
-   failed oxybutynin  XL 5mg  and 10mg , Vesicare  - POCT UA + glucose, PVR 11mL - We discussed the symptoms of overactive bladder (OAB), which include urinary urgency, urinary frequency, nocturia, with or without urge incontinence.  While we do not know the exact etiology of OAB, several treatment options exist. We discussed management including behavioral therapy (decreasing bladder irritants, urge suppression strategies, timed voids, bladder retraining), physical therapy, medication; for refractory cases posterior tibial nerve stimulation, sacral neuromodulation, and intravesical botulinum toxin injection.  For anticholinergic medications, we discussed the potential side effects of anticholinergics including dry eyes, dry mouth, constipation, cognitive impairment and urinary retention. For Beta-3 agonist medication, we discussed the potential side effect of elevated blood pressure which is more likely to occur in individuals with uncontrolled hypertension. - switch from Vesicare  to Trospium 20mg  BID, consider 60mg  if it is not cost prohibitive.  - encouraged fluid management, weight reduction

## 2024-05-13 NOTE — Assessment & Plan Note (Signed)
-   discussed association with pelvic floor dysfunction, suboptimal glycemic control - pt reports prior dietitian evaluation  - continue Ozempic  and encouraged diet modification

## 2024-05-13 NOTE — Assessment & Plan Note (Addendum)
-   Nuswab to r/o infectious etiology - encouraged to optimize glycemic control due to increased risk of yeast infection - discussed proper vulvar care, warm compression, avoid pad use, cotton only underwear and barrier ointment if needed  - discussed possible need for vulvar biopsy if refractory symptoms - avoid scratching due to risk of abrasions  - consider low dose vaginal estrogen if symptoms improve

## 2024-05-13 NOTE — Patient Instructions (Addendum)
 We discussed the symptoms of overactive bladder (OAB), which include urinary urgency, urinary frequency, night-time urination, with or without urge incontinence.  We discussed management including behavioral therapy (decreasing bladder irritants by following a bladder diet, urge suppression strategies, timed voids, bladder retraining), physical therapy, medication; and for refractory cases posterior tibial nerve stimulation, sacral neuromodulation, and intravesical botulinum toxin injection.   For anticholinergic medications, we discussed the potential side effects of anticholinergics including dry eyes, dry mouth, constipation, rare risks of cognitive impairment and urinary retention. You were given prescription for Trospium to replace Vesicare .  It can take a month to start working so give it time, but if you have bothersome side effects call sooner and we can try a different medication.  Call us  if you have trouble filling the prescription or if it's not covered by your insurance.  For night time frequency: - avoid fluid intake 3 hours before bedtime - elevated your feet during the day or use compression socks to reduce lower extremity swelling  Please visit the website below to sign up for an account. We will need an email address to send along with your prescription to verify your prescription once you have signed up.  [email or web address]  Trospium Chloride ER Capsule Extended Release  60mg   30 count $31.11  Trospium Chloride Tablet  20mg   60 count  $14.03  - discussed proper vulvar care, warm compression, avoid pad use, cotton only underwear and barrier ointment if needed  - discussed conservative management options with cold compress  For vaginal atrophy (thinning of the vaginal tissue that can cause dryness and burning) and UTI prevention we discussed estrogen replacement in the form of vaginal cream.   Start vaginal estrogen therapy nightly for two weeks  then 2 times weekly at night. This can be placed with your finger or an applicator inside the vagina and around the urethra.  Please let us  know if the prescription is too expensive and we can look for alternative options.   Is vaginal estrogen therapy safe for me? Vaginal estrogen preparations act on the vaginal skin, and only a very tiny amount is absorbed into the bloodstream (0.01%).  They work in a similar way to hand or face cream.  There is minimal absorption and they are therefore perfectly safe. If you have had breast cancer and have persistent troublesome symptoms which aren't settling with vaginal moisturisers and lubricants, local estrogen treatment may be a possibility, but consultation with your oncologist should take place first.

## 2024-05-13 NOTE — Progress Notes (Signed)
 New Patient Evaluation and Consultation  Referring Provider: Joshua Debby CROME, MD PCP: Joshua Debby CROME, MD Date of Service: 05/13/2024  SUBJECTIVE Chief Complaint: New Patient (Initial Visit) Abigail Collins is a 73 y.o. female here today for urinary urgency.)  History of Present Illness: Abigail Collins is a 73 y.o. White or Caucasian female seen in consultation at the request of Dr Joshua for evaluation of urinary incontinence.    Urgency urinary leakage with pad use started 1 year ago ED admission 11/22/23 due to hyperglycemia with glucose in 200-500s, blurred visions, polyuria, and polydipsia. Started on IV insulin .  Denies medication, surgical changes, falls, or weight changes Prior use of solifenacin  5mg  without relief after 1 month with dry mouth, Oxybutynin  XL 5mg  and 10mg  without relief Vaginal itching for 3-4 months, denies discharge or bleeding. Sometimes take a washcloth with cold vinegar with burning due to scratching, followed by decreased irritation around clitoral hood HbA1C 10.9 in 09/29/23 up from 8.5 in 02/09/23. Reports blood sugars in 200s fasting per patient at PCP visit 01/2024  Review of records significant for: T2DM on Lantus  40U daily, metformin  750mg , Farxiga  , Ozempic  0.5mg  restarted around 09/2023. Previously on Ozempic  with 60lb weight loss in 2019 and stopped lantus  resulted in hospital admission. PONV, bulging disc s/p kyphoplasty Actinic keratosis   Urinary Symptoms: Leaks urine with with a full bladder, with movement to the bathroom, and continuously Leaks 6 time(s) per days, 2x/night Pad use: 3 pads per day when she goes out. Mostly managed with 3-4 underwear changes.  Eats ice or drinks water  all night intermittently due to dry mouth Patient is not bothered by UI symptoms unless if she goes out.  Day time voids 6-8.  Nocturia: 2 times per night to void. Denies snoring or sleep apnea Intermittent leg swelling after knee replacement, unable to  tolerate compression socks use Voiding dysfunction:  empties bladder well.  Patient does not use a catheter to empty bladder.  When urinating, patient feels dribbling after finishing Drinks: 80-96 oz water  per day, 16oz diet lemonade, 8oz of coffee   UTIs: 0 UTI's in the last year.   Denies history of blood in urine, kidney or bladder stones, pyelonephritis, bladder cancer, and kidney cancer No results found for the last 90 days.  Pelvic Organ Prolapse Symptoms:                  Patient Denies a feeling of a bulge the vaginal area.   Bowel Symptom: Bowel movements: 1 time(s) per day Stool consistency: soft  Straining: no.  Splinting: no.  Incomplete evacuation: no.  Patient Denies accidental bowel leakage / fecal incontinence Bowel regimen: none Last colonoscopy: Results diverticulosis, polpyectomy. Due in 10 years [facility] Colonoscopy          Upcoming     Colonoscopy (Every 10 Years) Next due on 07/10/2025    07/11/2015  COLONOSCOPY   Only the first 1 history entries have been loaded, but more history exists.                Sexual Function Sexually active: no.  Sexual orientation: Straight Pain with sex: No  Pelvic Pain Denies pelvic pain   Past Medical History:  Past Medical History:  Diagnosis Date   Anemia    hx of   Anginal pain (HCC)    left arm pain, sees Dr. Woodroe, had card cath 2013   Arthritis    all over (09/14/2012)   Asthma  takes inhaler 2x day   Bronchitis    hx of   Bulging disc    lower   Carpal tunnel syndrome of left wrist    Depression    denies   Exertional dyspnea    sometimes laying down (09/14/2012)   Headache    frequent headaches,usually if she does not eat   Hyperlipidemia    Hypertension    sees Dr. Elspeth Molt, primary   Pneumonia 03/2012   PONV (postoperative nausea and vomiting)    Thyroid  disease 1960's   don't have it now (09/14/2012)   Type II diabetes mellitus (HCC)    Urinary tract infection     hx of   Vomiting    pt states she vomits every am     Past Surgical History:   Past Surgical History:  Procedure Laterality Date   CARDIAC CATHETERIZATION  05/13/2012   mod luminal irregularity of pLAD, no sign CAD, EF 65%.   CHOLECYSTECTOMY  06/13/1969   ESOPHAGOGASTRODUODENOSCOPY     KYPHOPLASTY N/A 02/10/2013   Procedure:  LUMBAR TWO KYPHOPLASTY;  Surgeon: Reyes JONETTA Budge, MD;  Location: [facility];  Service: Neurosurgery;  Laterality: N/A;  L2 Kyphoplasty; Will use Stern's Carm.   ORIF HUMERUS FRACTURE Right 01/06/2018   Procedure: OPEN REDUCTION INTERNAL FIXATION (ORIF) RIGHT PROXIMAL HUMERUS FRACTURE;  Surgeon: Jerri Kay HERO, MD;  Location: [facility] OR;  Service: Orthopedics;  Laterality: Right;   TOTAL ELBOW REPLACEMENT  ~ 2005   right (09/14/2012)   TOTAL KNEE ARTHROPLASTY  09/13/2012   Procedure: TOTAL KNEE ARTHROPLASTY;  Surgeon: Marcey Raman, MD;  Location: [facility] OR;  Service: Orthopedics;  Laterality: Right;   TOTAL KNEE ARTHROPLASTY Left 08/02/2018   Procedure: LEFT TOTAL KNEE ARTHROPLASTY;  Surgeon: Jerri Kay HERO, MD;  Location: [facility] OR;  Service: Orthopedics;  Laterality: Left;   TUBAL LIGATION  06/13/1969   VAGINAL HYSTERECTOMY  06/13/1969     Past OB/GYN History: OB History  Gravida Para Term Preterm AB Living  2 1 1  1 1   SAB IAB Ectopic Multiple Live Births      1    # Outcome Date GA Lbr Len/2nd Weight Sex Type Anes PTL Lv  2 Term     M Vag-Spont   LIV  1 AB             Vaginal deliveries: 10lbs, episiotomy repaired. Forceps/ Vacuum deliveries: 0, Cesarean section: 0 Menopausal: Yes, at age 20s, Denies vaginal bleeding since menopause Contraception: BTL s/p TVH due to AUB and menopause. Denies complications Last pap smear was normal 12/31/12.  Any history of abnormal pap smears: no. No results found for: DIAGPAP, HPVHIGH, ADEQPAP  Medications: Patient has a current medication list which includes the following prescription(s): accu-chek softclix lancets,  atorvastatin , accu-chek guide me, freestyle libre 3 sensor, dapagliflozin  propanediol, [START ON 05/16/2024] estradiol, gvoke hypopen  2-pack, accu-chek guide, tresiba  flextouch, sure comfort pen needles, irbesartan , metformin , omeprazole , ozempic  (1 mg/dose), and trospium.   Allergies: Patient is allergic to lisinopril, amlodipine , tape, morphine  and codeine, and percocet [oxycodone -acetaminophen ].   Social History:  Social History   Tobacco Use   Smoking status: Former    Current packs/day: 0.00    Average packs/day: 0.3 packs/day for 20.0 years (5.0 ttl pk-yrs)    Types: Cigarettes    Start date: 10/13/1989    Quit date: 10/13/2009    Years since quitting: 14.5    Passive exposure: Past   Smokeless tobacco: Never  Vaping Use  Vaping status: Never Used  Substance Use Topics   Alcohol use: No    Comment: 09/14/2012 did drink a little in my younger days   Drug use: No    Relationship status: married Patient lives with her spouse.   Patient is not employed. Regular exercise: No History of abuse: No  Family History:   Family History  Problem Relation Age of Onset   Arthritis Mother    Arthritis Father    Hypertension Father    Diabetes Father    Colon cancer Neg Hx    Uterine cancer Neg Hx    Bladder Cancer Neg Hx    Renal cancer Neg Hx      Review of Systems: Review of Systems  Constitutional:  Negative for fever, malaise/fatigue and weight loss.       Weight gain  Respiratory:  Negative for cough, shortness of breath and wheezing.   Cardiovascular:  Negative for chest pain, palpitations and leg swelling.  Gastrointestinal:  Negative for abdominal pain and blood in stool.  Genitourinary:  Negative for hematuria.  Skin:  Negative for rash.  Neurological:  Negative for dizziness, weakness and headaches.  Endo/Heme/Allergies:  Bruises/bleeds easily.       Hot flashes  Psychiatric/Behavioral:  Negative for depression. The patient is not nervous/anxious.       OBJECTIVE Physical Exam: Vitals:   05/13/24 1436  BP: 133/81  Pulse: 91  Weight: 210 lb (95.3 kg)  Height: 5' 0.63 (1.54 m)   Physical Exam Constitutional:      General: She is not in acute distress.    Appearance: Normal appearance.  Genitourinary:     Bladder and urethral meatus normal.     No lesions in the vagina.     Genitourinary Comments: Bilateral symmetric erythema, no focal lesions     Right Labia: No rash, tenderness, lesions, skin changes or Bartholin's cyst.    Left Labia: No tenderness, lesions, skin changes, Bartholin's cyst or rash.       No vaginal discharge, erythema, tenderness, bleeding, ulceration or granulation tissue.     Anterior and posterior vaginal prolapse present.    Mild vaginal atrophy present.     Right Adnexa: not tender, not full and no mass present.    Left Adnexa: not tender, not full and no mass present.    Cervix is absent.     Uterus is absent.     Urethral meatus caruncle not present.    No urethral prolapse, tenderness, mass, hypermobility, discharge or stress urinary incontinence with cough stress test present.     Bladder is not tender, urgency on palpation not present and masses not present.      Pelvic Floor: Levator muscle strength is 3/5.    Levator ani not tender, obturator internus not tender, no asymmetrical contractions present and no pelvic spasms present.    Anal wink absent and BC reflex absent.     Symmetrical pelvic sensation. Cardiovascular:     Rate and Rhythm: Normal rate.  Pulmonary:     Effort: Pulmonary effort is normal. No respiratory distress.  Abdominal:     General: There is no distension.     Palpations: Abdomen is soft. There is no mass.     Tenderness: There is no abdominal tenderness.     Hernia: No hernia is present.  Neurological:     Mental Status: She is alert.  Vitals reviewed. Exam conducted with a chaperone present.      POP-Q:  POP-Q  -1                                             Aa   -1                                           Ba  -7                                              C   3                                            Gh  4                                            Pb  9                                            tvl   -1                                            Ap  -1                                            Bp                                                 D     Post-Void Residual (PVR) by Bladder Scan: In order to evaluate bladder emptying, we discussed obtaining a postvoid residual and patient agreed to this procedure.  Procedure: The ultrasound unit was placed on the patient's abdomen in the suprapubic region after the patient had voided.    Post Void Residual - 05/13/24 1457       Post Void Residual   Post Void Residual 11 mL           Laboratory Results: Lab Results  Component Value Date   COLORU yellow 05/13/2024   CLARITYU clear 05/13/2024   GLUCOSEUR =500 (A) 05/13/2024   BILIRUBINUR negative 05/13/2024   SPECGRAV 1.025 05/13/2024   RBCUR negative 05/13/2024   PHUR 5.5 05/13/2024   PROTEINUR NEGATIVE 11/21/2022   UROBILINOGEN 0.2 05/13/2024   LEUKOCYTESUR Negative 05/13/2024    Lab Results  Component Value Date   CREATININE 0.87 09/29/2023   CREATININE 0.97 11/22/2022   CREATININE 1.00 11/21/2022    Lab Results  Component Value Date  HGBA1C 10.9 Repeated and verified X2. (H) 09/29/2023    Lab Results  Component Value Date   HGB 13.4 09/29/2023     ASSESSMENT AND PLAN Ms. Leonhardt is a 73 y.o. with:  1. OAB (overactive bladder)   2. Nocturia   3. Vulvar itching   4. Vaginal atrophy   5. BMI 40.0-44.9, adult (HCC)     OAB (overactive bladder) Assessment & Plan: - failed oxybutynin  XL 5mg  and 10mg , Vesicare  - POCT UA + glucose, PVR 11mL - We discussed the symptoms of overactive bladder (OAB), which include urinary urgency, urinary frequency, nocturia, with or without urge  incontinence.  While we do not know the exact etiology of OAB, several treatment options exist. We discussed management including behavioral therapy (decreasing bladder irritants, urge suppression strategies, timed voids, bladder retraining), physical therapy, medication; for refractory cases posterior tibial nerve stimulation, sacral neuromodulation, and intravesical botulinum toxin injection.  For anticholinergic medications, we discussed the potential side effects of anticholinergics including dry eyes, dry mouth, constipation, cognitive impairment and urinary retention. For Beta-3 agonist medication, we discussed the potential side effect of elevated blood pressure which is more likely to occur in individuals with uncontrolled hypertension. - switch from Vesicare  to Trospium 20mg  BID, consider 60mg  if it is not cost prohibitive.  - encouraged fluid management, weight reduction  Orders: -     Trospium Chloride; Take 1 tablet (20 mg total) by mouth 2 (two) times daily.  Dispense: 60 tablet; Refill: 0 -     POCT URINALYSIS DIP (CLINITEK) -     Estradiol; Place 0.5g nightly for two weeks then twice a week after  Dispense: 30 g; Refill: 3  Nocturia Assessment & Plan: - avoid fluid intake 3 hours before bedtime - elevated your during the day to reduce lower extremity swelling - trial of Trospium at bedtime, discontinue vesicare  once she receives Trospium Rx  - discussed association of increased urine output and increased thirst with suboptimal glycemic control - failed oxybutynin  XL 5mg  and 10mg , Vesicare   Orders: -     Trospium Chloride; Take 1 tablet (20 mg total) by mouth 2 (two) times daily.  Dispense: 60 tablet; Refill: 0  Vulvar itching Assessment & Plan: - Nuswab to r/o infectious etiology - encouraged to optimize glycemic control due to increased risk of yeast infection - discussed proper vulvar care, warm compression, avoid pad use, cotton only underwear and barrier ointment if  needed  - discussed possible need for vulvar biopsy if refractory symptoms - avoid scratching due to risk of abrasions  - consider low dose vaginal estrogen if symptoms improve  Orders: -     Cervicovaginal ancillary only  Vaginal atrophy Assessment & Plan: - For symptomatic vaginal atrophy options include lubrication with a water -based lubricant, personal hygiene measures and barrier protection against wetness, and estrogen replacement in the form of vaginal cream, vaginal tablets, or a time-released vaginal ring.   - start low dose vaginal estrogen if vulvar irritation resolves  Orders: -     Estradiol; Place 0.5g nightly for two weeks then twice a week after  Dispense: 30 g; Refill: 3  BMI 40.0-44.9, adult (HCC) Assessment & Plan: - discussed association with pelvic floor dysfunction, suboptimal glycemic control - pt reports prior dietitian evaluation  - continue Ozempic  and encouraged diet modification   Time spent: I spent 79 minutes dedicated to the care of this patient on the date of this encounter to include pre-visit review of records, face-to-face time with the patient  discussing OAB, nocturia, vulvar itching, vaginal atrophy, BMI, and post visit documentation and ordering medication/ testing.    Lianne ONEIDA Gillis, MD

## 2024-05-13 NOTE — Assessment & Plan Note (Signed)
-   For symptomatic vaginal atrophy options include lubrication with a water -based lubricant, personal hygiene measures and barrier protection against wetness, and estrogen replacement in the form of vaginal cream, vaginal tablets, or a time-released vaginal ring.   - start low dose vaginal estrogen if vulvar irritation resolves

## 2024-05-13 NOTE — Assessment & Plan Note (Addendum)
-   avoid fluid intake 3 hours before bedtime - elevated your during the day to reduce lower extremity swelling - trial of Trospium at bedtime, discontinue vesicare  once she receives Trospium Rx  - discussed association of increased urine output and increased thirst with suboptimal glycemic control - failed oxybutynin  XL 5mg  and 10mg , Vesicare

## 2024-05-16 ENCOUNTER — Ambulatory Visit: Payer: Self-pay | Admitting: Obstetrics

## 2024-05-16 DIAGNOSIS — L292 Pruritus vulvae: Secondary | ICD-10-CM

## 2024-05-16 LAB — CERVICOVAGINAL ANCILLARY ONLY
Bacterial Vaginitis (gardnerella): NEGATIVE
Candida Glabrata: POSITIVE — AB
Candida Vaginitis: POSITIVE — AB
Comment: NEGATIVE
Comment: NEGATIVE
Comment: NEGATIVE

## 2024-05-16 MED ORDER — FLUCONAZOLE 150 MG PO TABS: 150.0000 mg | ORAL_TABLET | Freq: Once | ORAL | 1 refills | Status: AC

## 2024-05-17 ENCOUNTER — Other Ambulatory Visit: Admitting: Pharmacist

## 2024-05-17 DIAGNOSIS — E1122 Type 2 diabetes mellitus with diabetic chronic kidney disease: Secondary | ICD-10-CM

## 2024-05-17 NOTE — Patient Instructions (Signed)
 It was a pleasure speaking with you today!  I will send in a new request for Ozempic  1mg  and leave a sample for you at the office for you to use until the new prescription comes in.   Feel free to call with any questions or concerns!  Lum Ricks, PharmD Candidate  [facility] [employer]    [name], PharmD, [employer], CPP Clinical Pharmacist Practitioner [facility] [phone]

## 2024-05-17 NOTE — Progress Notes (Signed)
 05/17/24  Name: Abigail Collins MRN: [id number] DOB: 1951-06-14  Chief Complaint  Patient presents with   Diabetes   Medication Management    Abigail Collins is a 73 y.o. year old female who presented for a telephone visit.   They were referred to the pharmacist by their PCP for assistance in managing diabetes and medication access.   Subjective:  Care Team: Prima/ry Care Provider: Joshua Debby CROME, MD ; Next Scheduled Visit: 05/23/24  Medication Access/Adherence  Current Pharmacy:  [id number] A[place], [place] - [address] AT [facility] OF [facility] [address] [place] [address] Phone: [phone] Fax: [phone]    Patient reports affordability concerns with their medications: Yes  Patient reports access/transportation concerns to their pharmacy: No  Patient reports adherence concerns with their medications:  No    Diabetes:  Current medications: Tresiba  40 units daily, Metformin  1000 mg twice daily, Farxiga  10 mg daily, Ozempic  1 mg weekly  Recent BG: 150 on 8/4 fasting, has not been checking recently. Was in the 200s consistently 1 month ago - She was previously interested in getting Freestyle Libre 3 Plus CGM once Medicaid started. Today, she states she does not want to use CGM because she does not want alarms going off and waking her up  Received Farxiga , Tresiba  and pen needles from PAP  Objective:  Lab Results  Component Value Date   HGBA1C 10.9 Repeated and verified X2. (H) 09/29/2023    Lab Results  Component Value Date   CREATININE 0.87 09/29/2023   BUN 17 09/29/2023   NA 139 09/29/2023   K 4.7 09/29/2023   CL 104 09/29/2023   CO2 26 09/29/2023    Lab Results  Component Value Date   CHOL 148 09/29/2023   HDL 58.40 09/29/2023   LDLCALC 20 09/29/2023   LDLDIRECT 44.0 10/27/2022   TRIG 350.0 (H) 09/29/2023   CHOLHDL 3 09/29/2023    Medications Reviewed Today     Reviewed by Abigail Collins, RPH (Pharmacist) on 05/17/24 at 1136  Med List Status: <None>   Medication Order Taking? Sig Documenting Provider Last Dose Status Informant  Accu-Chek Softclix Lancets lancets [id number]  Use to check blood glucose twice daily E11.65 T2DM Abigail Debby CROME, MD  Active   atorvastatin  (LIPITOR) 20 MG tablet [id number]  Take 1 tablet (20 mg total) by mouth daily. Abigail Debby CROME, MD  Active   Blood Glucose Monitoring Suppl (ACCU-CHEK GUIDE ME) w/Device KIT [id number]  Use to check blood glucose twice daily E11.65 T2DM Abigail Debby CROME, MD  Active   dapagliflozin  propanediol (FARXIGA ) 10 MG TABS tablet [id number] Yes Take 1 tablet (10 mg total) by mouth daily before breakfast. Abigail Debby CROME, MD  Active            Med Note Abigail Collins, Abigail Collins   Tue Jan 19, 2024  4:34 PM) Through PAP  estradiol  (ESTRACE ) 0.1 MG/GM vaginal cream [id number]  Place 0.5g nightly for two weeks then twice a week after Abigail Lianne DASEN, MD  Active   Glucagon  (GVOKE HYPOPEN  2-PACK) 1 MG/0.2ML Abigail Collins [id number]  Inject 1 Act into the skin daily as needed. Abigail Debby CROME, MD  Active   glucose blood (ACCU-CHEK GUIDE) test strip [id number]  Use to check blood glucose twice daily E11.65 T2DM Abigail Debby CROME, MD  Active   insulin  degludec (TRESIBA  FLEXTOUCH) 100 UNIT/ML FlexTouch Pen [id number] Yes Inject 40 Units into  the skin daily. Getting through PAP Abigail Debby CROME, MD  Active   Insulin  Pen Needle (SURE COMFORT PEN NEEDLES) 32G X 4 MM MISC [id number]  Inject 1 Act into the skin 2 (two) times daily. Abigail Debby CROME, MD  Active   irbesartan  (AVAPRO ) 300 MG tablet [id number]  Take 1 tablet (300 mg total) by mouth daily. Abigail Debby CROME, MD  Active   metFORMIN  (GLUCOPHAGE ) 1000 MG tablet [id number] Yes Take 1 tablet (1,000 mg total) by mouth 2 (two) times daily with a meal. Abigail Debby CROME, MD  Active   omeprazole  (PRILOSEC) 40 MG capsule [id number]  Take 1 capsule (40 mg total) by mouth daily. Abigail Debby CROME, MD  Active   Semaglutide , 1  MG/DOSE, (OZEMPIC , 1 MG/DOSE,) 2 MG/1.5ML SOPN [id number] Yes Inject into the skin. [provider]  Active   trospium  (SANCTURA ) 20 MG tablet [id number]  Take 1 tablet (20 mg total) by mouth 2 (two) times daily. Abigail Lianne DASEN, MD  Active               Assessment/Plan:   Diabetes: - Currently uncontrolled, Goal A1c <8% - Farxiga  and Ozempic  and Tresiba  PAP were approved  - Sending in ozempic  1mg  request to PAP and leaving sample for patient to pick up to cover until new prescription comes in.  - Reviewed that CGM alarms can be disabled other than critical lows, she said that she would think about it.   - Upcoming PCP appt, need updated A1c  Follow Up Plan: 8/26   Abigail Collins, PharmD Candidate  [facility] [employer] [facility] [employer]   Abigail Collins, PharmD, Abigail Collins, CPP Clinical Pharmacist Practitioner [facility] [phone]

## 2024-05-23 ENCOUNTER — Encounter: Payer: Self-pay | Admitting: Internal Medicine

## 2024-05-23 ENCOUNTER — Ambulatory Visit (INDEPENDENT_AMBULATORY_CARE_PROVIDER_SITE_OTHER): Admitting: Internal Medicine

## 2024-05-23 VITALS — BP 136/88 | HR 89 | Temp 97.6°F | Resp 16 | Ht 60.63 in | Wt 208.0 lb

## 2024-05-23 DIAGNOSIS — I10 Essential (primary) hypertension: Secondary | ICD-10-CM | POA: Diagnosis not present

## 2024-05-23 DIAGNOSIS — E781 Pure hyperglyceridemia: Secondary | ICD-10-CM | POA: Diagnosis not present

## 2024-05-23 DIAGNOSIS — E119 Type 2 diabetes mellitus without complications: Secondary | ICD-10-CM

## 2024-05-23 DIAGNOSIS — R5382 Chronic fatigue, unspecified: Secondary | ICD-10-CM | POA: Insufficient documentation

## 2024-05-23 DIAGNOSIS — Z0000 Encounter for general adult medical examination without abnormal findings: Secondary | ICD-10-CM | POA: Diagnosis not present

## 2024-05-23 DIAGNOSIS — Z7984 Long term (current) use of oral hypoglycemic drugs: Secondary | ICD-10-CM

## 2024-05-23 DIAGNOSIS — F321 Major depressive disorder, single episode, moderate: Secondary | ICD-10-CM | POA: Insufficient documentation

## 2024-05-23 DIAGNOSIS — Z23 Encounter for immunization: Secondary | ICD-10-CM

## 2024-05-23 DIAGNOSIS — L989 Disorder of the skin and subcutaneous tissue, unspecified: Secondary | ICD-10-CM | POA: Insufficient documentation

## 2024-05-23 DIAGNOSIS — Z794 Long term (current) use of insulin: Secondary | ICD-10-CM

## 2024-05-23 DIAGNOSIS — Z1231 Encounter for screening mammogram for malignant neoplasm of breast: Secondary | ICD-10-CM

## 2024-05-23 DIAGNOSIS — K2100 Gastro-esophageal reflux disease with esophagitis, without bleeding: Secondary | ICD-10-CM

## 2024-05-23 DIAGNOSIS — K219 Gastro-esophageal reflux disease without esophagitis: Secondary | ICD-10-CM

## 2024-05-23 DIAGNOSIS — R9431 Abnormal electrocardiogram [ECG] [EKG]: Secondary | ICD-10-CM

## 2024-05-23 DIAGNOSIS — Z7985 Long-term (current) use of injectable non-insulin antidiabetic drugs: Secondary | ICD-10-CM

## 2024-05-23 DIAGNOSIS — E785 Hyperlipidemia, unspecified: Secondary | ICD-10-CM | POA: Diagnosis not present

## 2024-05-23 DIAGNOSIS — Z0001 Encounter for general adult medical examination with abnormal findings: Secondary | ICD-10-CM

## 2024-05-23 DIAGNOSIS — M170 Bilateral primary osteoarthritis of knee: Secondary | ICD-10-CM

## 2024-05-23 LAB — BASIC METABOLIC PANEL WITH GFR
BUN: 20 mg/dL (ref 6–23)
CO2: 27 meq/L (ref 19–32)
Calcium: 9.8 mg/dL (ref 8.4–10.5)
Chloride: 104 meq/L (ref 96–112)
Creatinine, Ser: 0.92 mg/dL (ref 0.40–1.20)
GFR: 62.33 mL/min (ref >60.00)
Glucose, Bld: 150 mg/dL — ABNORMAL HIGH (ref 70–99)
Potassium: 4.7 meq/L (ref 3.5–5.1)
Sodium: 140 meq/L (ref 135–145)

## 2024-05-23 LAB — CBC WITH DIFFERENTIAL/PLATELET
Basophils Absolute: 0 K/uL (ref 0.0–0.1)
Basophils Relative: 0.8 % (ref 0.0–3.0)
Eosinophils Absolute: 0.1 K/uL (ref 0.0–0.7)
Eosinophils Relative: 2.1 % (ref 0.0–5.0)
HCT: 45.1 % (ref 36.0–46.0)
Hemoglobin: 14.5 g/dL (ref 12.0–15.0)
Lymphocytes Relative: 35.4 % (ref 12.0–46.0)
Lymphs Abs: 2 K/uL (ref 0.7–4.0)
MCHC: 32.2 g/dL (ref 30.0–36.0)
MCV: 88.7 fl (ref 78.0–100.0)
Monocytes Absolute: 0.3 K/uL (ref 0.1–1.0)
Monocytes Relative: 5 % (ref 3.0–12.0)
Neutro Abs: 3.2 K/uL (ref 1.4–7.7)
Neutrophils Relative %: 56.7 % (ref 43.0–77.0)
Platelets: 172 K/uL (ref 150.0–400.0)
RBC: 5.09 Mil/uL (ref 3.87–5.11)
RDW: 15.3 % (ref 11.5–15.5)
WBC: 5.6 K/uL (ref 4.0–10.5)

## 2024-05-23 LAB — URINALYSIS, ROUTINE W REFLEX MICROSCOPIC
Bilirubin Urine: NEGATIVE
Hgb urine dipstick: NEGATIVE
Ketones, ur: NEGATIVE
Leukocytes,Ua: NEGATIVE
Nitrite: NEGATIVE
RBC / HPF: NONE SEEN (ref 0–?)
Specific Gravity, Urine: 1.015 (ref 1.000–1.030)
Total Protein, Urine: NEGATIVE
Urine Glucose: >=1000 — AB
Urobilinogen, UA: 0.2 (ref 0.0–1.0)
pH: 6 (ref 5.0–8.0)

## 2024-05-23 LAB — HEPATIC FUNCTION PANEL
ALT: 15 U/L (ref 0–35)
AST: 23 U/L (ref 0–37)
Albumin: 4.5 g/dL (ref 3.5–5.2)
Alkaline Phosphatase: 71 U/L (ref 39–117)
Bilirubin, Direct: 0.3 mg/dL (ref 0.0–0.3)
Total Bilirubin: 1.1 mg/dL (ref 0.2–1.2)
Total Protein: 7.4 g/dL (ref 6.0–8.3)

## 2024-05-23 LAB — LIPID PANEL
Cholesterol: 110 mg/dL (ref 0–200)
HDL: 52.5 mg/dL (ref >39.00)
LDL Cholesterol: 23 mg/dL (ref 0–99)
NonHDL: 57.63
Total CHOL/HDL Ratio: 2
Triglycerides: 171 mg/dL — ABNORMAL HIGH (ref 0.0–149.0)
VLDL: 34.2 mg/dL (ref 0.0–40.0)

## 2024-05-23 LAB — POCT GLYCOSYLATED HEMOGLOBIN (HGB A1C): Hemoglobin A1C: 7.5 % — AB (ref 4.0–5.6)

## 2024-05-23 LAB — MICROALBUMIN / CREATININE URINE RATIO
Creatinine,U: 68.7 mg/dL
Microalb Creat Ratio: UNDETERMINED mg/g (ref 0.0–30.0)
Microalb, Ur: <0.7 mg/dL

## 2024-05-23 LAB — TSH: TSH: 2.41 u[IU]/mL (ref 0.35–5.50)

## 2024-05-23 MED ORDER — TRESIBA FLEXTOUCH 100 UNIT/ML ~~LOC~~ SOPN: 40.0000 [IU] | PEN_INJECTOR | Freq: Every day | SUBCUTANEOUS | 1 refills | Status: AC

## 2024-05-23 MED ORDER — SHINGRIX 50 MCG/0.5ML IM SUSR
0.5000 mL | Freq: Once | INTRAMUSCULAR | 1 refills | Status: AC
Start: 2024-05-23 — End: 2024-05-23

## 2024-05-23 MED ORDER — DAPAGLIFLOZIN PROPANEDIOL 10 MG PO TABS: 10.0000 mg | ORAL_TABLET | Freq: Every day | ORAL | 1 refills | Status: DC

## 2024-05-23 MED ORDER — OMEPRAZOLE 40 MG PO CPDR
40.0000 mg | DELAYED_RELEASE_CAPSULE | Freq: Every day | ORAL | 1 refills | Status: DC
Start: 2024-05-23 — End: 2024-07-07

## 2024-05-23 MED ORDER — ATORVASTATIN CALCIUM 20 MG PO TABS
20.0000 mg | ORAL_TABLET | Freq: Every day | ORAL | 1 refills | Status: DC
Start: 2024-05-23 — End: 2024-07-07

## 2024-05-23 MED ORDER — METFORMIN HCL 1000 MG PO TABS: 1000.0000 mg | ORAL_TABLET | Freq: Two times a day (BID) | ORAL | 1 refills | Status: DC

## 2024-05-23 MED ORDER — SURE COMFORT PEN NEEDLES 32G X 4 MM MISC: 1.0000 | Freq: Two times a day (BID) | 1 refills | Status: AC

## 2024-05-23 MED ORDER — DULOXETINE HCL 30 MG PO CPEP: 30.0000 mg | ORAL_CAPSULE | Freq: Every day | ORAL | 0 refills | Status: DC

## 2024-05-23 NOTE — Progress Notes (Signed)
 Subjective:  Patient ID: Abigail Collins, female    DOB: 1951/09/26  Age: 73 y.o. MRN: [id number]  CC: Annual Exam, Hypertension, Hyperlipidemia, and Diabetes   HPI Abigail Collins presents for a CPX and f/up ---   Discussed the use of AI scribe software for clinical note transcription with the patient, who gave verbal consent to proceed.  History of Present Illness Abigail Collins is a 73 year old female who presents with fatigue and dizziness.  She experiences persistent fatigue characterized by a lack of energy and feeling tired consistently. No associated chest pain or shortness of breath is noted. Dizziness occurs occasionally, particularly upon standing, but is infrequent.  She has a history of blood sugar level issues and has not checked her levels in the past two days due to fatigue. The last recorded blood sugar level was 150 mg/dL. She is currently on Ozempic  and reports a weight loss from 218 pounds to 208 pounds.  She occasionally experiences abdominal pain, with the last episode occurring about a week ago. She denies any nausea, vomiting, or constipation associated with the use of Ozempic .  She has a persistent spot on the back of her leg, initially thought to be a wart, which has been present for three to four months.  No chest pain, shortness of breath, palpitations, or symptoms during exercise. Occasional dizziness and issues with blood sugar levels are noted.      Outpatient Medications Prior to Visit  Medication Sig Dispense Refill   Accu-Chek Softclix Lancets lancets Use to check blood glucose twice daily E11.65 T2DM 200 each 11   Blood Glucose Monitoring Suppl (ACCU-CHEK GUIDE ME) w/Device KIT Use to check blood glucose twice daily E11.65 T2DM 1 kit 0   estradiol  (ESTRACE ) 0.1 MG/GM vaginal cream Place 0.5g nightly for two weeks then twice a week after 30 g 3   Glucagon  (GVOKE HYPOPEN  2-PACK) 1 MG/0.2ML SOAJ Inject 1 Act into the skin daily as needed. 2 mL  5   glucose blood (ACCU-CHEK GUIDE) test strip Use to check blood glucose twice daily E11.65 T2DM 200 each 11   irbesartan  (AVAPRO ) 300 MG tablet Take 1 tablet (300 mg total) by mouth daily. 90 tablet 0   Semaglutide , 1 MG/DOSE, (OZEMPIC , 1 MG/DOSE,) 2 MG/1.5ML SOPN Inject into the skin.     trospium  (SANCTURA ) 20 MG tablet Take 1 tablet (20 mg total) by mouth 2 (two) times daily. 60 tablet 0   atorvastatin  (LIPITOR) 20 MG tablet Take 1 tablet (20 mg total) by mouth daily. 90 tablet 0   dapagliflozin  propanediol (FARXIGA ) 10 MG TABS tablet Take 1 tablet (10 mg total) by mouth daily before breakfast. 90 tablet 0   insulin  degludec (TRESIBA  FLEXTOUCH) 100 UNIT/ML FlexTouch Pen Inject 40 Units into the skin daily. Getting through PAP     Insulin  Pen Needle (SURE COMFORT PEN NEEDLES) 32G X 4 MM MISC Inject 1 Act into the skin 2 (two) times daily. 100 each 1   metFORMIN  (GLUCOPHAGE ) 1000 MG tablet Take 1 tablet (1,000 mg total) by mouth 2 (two) times daily with a meal. 180 tablet 0   omeprazole  (PRILOSEC) 40 MG capsule Take 1 capsule (40 mg total) by mouth daily. 90 capsule 0   No facility-administered medications prior to visit.    ROS Review of Systems  Constitutional:  Positive for fatigue. Negative for appetite change, chills, diaphoresis, fever and unexpected weight change.  HENT: Negative.    Eyes: Negative.   Respiratory: Negative.  Negative for cough, chest tightness, shortness of breath and wheezing.   Cardiovascular:  Negative for chest pain, palpitations and leg swelling.  Gastrointestinal:  Negative for abdominal pain, constipation, diarrhea, nausea and vomiting.  Endocrine: Negative.   Genitourinary: Negative.  Negative for difficulty urinating.  Musculoskeletal:  Positive for arthralgias, back pain and gait problem. Negative for joint swelling and myalgias.  Neurological:  Positive for dizziness. Negative for speech difficulty, weakness and light-headedness.  Hematological:   Negative for adenopathy. Does not bruise/bleed easily.  Psychiatric/Behavioral:  Positive for dysphoric mood. Negative for behavioral problems, confusion, decreased concentration, hallucinations, self-injury, sleep disturbance and suicidal ideas. The patient is nervous/anxious. The patient is not hyperactive.     Objective:  BP 136/88 (BP Location: Left Arm, Patient Position: Sitting)   Pulse 89   Temp 97.6 F (36.4 C) (Temporal)   Resp 16   Ht 5' 0.63 (1.54 m)   Wt 208 lb (94.3 kg)   SpO2 95%   BMI 39.78 kg/m   BP Readings from Last 3 Encounters:  05/23/24 136/88  05/13/24 133/81  09/29/23 132/80    Wt Readings from Last 3 Encounters:  05/23/24 208 lb (94.3 kg)  05/13/24 210 lb (95.3 kg)  02/08/24 216 lb (98 kg)    Physical Exam Vitals reviewed.  Constitutional:      Appearance: Normal appearance.  HENT:     Nose: Nose normal.     Mouth/Throat:     Mouth: Mucous membranes are moist.  Eyes:     General: No scleral icterus.    Conjunctiva/sclera: Conjunctivae normal.  Cardiovascular:     Rate and Rhythm: Normal rate and regular rhythm.     Heart sounds: No murmur heard.    No friction rub. No gallop.     Comments: EKG--- NSR, 80 bpm LAD Anterior infarct pattern No LVH Unchanged  Pulmonary:     Effort: Pulmonary effort is normal.     Breath sounds: No stridor. No wheezing, rhonchi or rales.  Abdominal:     General: Abdomen is flat.     Palpations: There is no mass.     Tenderness: There is no abdominal tenderness. There is no guarding.     Hernia: No hernia is present.  Musculoskeletal:        General: Normal range of motion.     Cervical back: Neck supple.     Right lower leg: No edema.     Left lower leg: No edema.  Lymphadenopathy:     Cervical: No cervical adenopathy.  Skin:    General: Skin is warm and dry.     Findings: Lesion present. No rash.  Neurological:     General: No focal deficit present.     Mental Status: She is alert. Mental  status is at baseline.  Psychiatric:        Attention and Perception: She is inattentive.        Mood and Affect: Mood is anxious and depressed. Affect is flat and tearful.        Speech: Speech normal. Speech is not delayed or tangential.        Behavior: Behavior normal. Behavior is cooperative.        Thought Content: Thought content normal. Thought content is not paranoid or delusional. Thought content does not include homicidal or suicidal ideation. Thought content does not include homicidal or suicidal plan.        Cognition and Memory: Cognition normal.  Judgment: Judgment normal.     Lab Results  Component Value Date   WBC 5.6 05/23/2024   HGB 14.5 05/23/2024   HCT 45.1 05/23/2024   PLT 172.0 05/23/2024   GLUCOSE 150 (H) 05/23/2024   CHOL 110 05/23/2024   TRIG 171.0 (H) 05/23/2024   HDL 52.50 05/23/2024   LDLDIRECT 44.0 10/27/2022   LDLCALC 23 05/23/2024   ALT 15 05/23/2024   AST 23 05/23/2024   NA 140 05/23/2024   K 4.7 05/23/2024   CL 104 05/23/2024   CREATININE 0.92 05/23/2024   BUN 20 05/23/2024   CO2 27 05/23/2024   TSH 2.41 05/23/2024   INR 0.91 07/26/2018   HGBA1C 7.5 (A) 05/23/2024   MICROALBUR <0.7 05/23/2024    No results found.  Assessment & Plan:  Skin lesion of left leg -     Ambulatory referral to Dermatology  Insulin -requiring or dependent type II diabetes mellitus (HCC)- Blood sugar is adequately well controlled. -     Basic metabolic panel with GFR; Future -     Urinalysis, Routine w reflex microscopic; Future -     Microalbumin / creatinine urine ratio; Future -     POCT glycosylated hemoglobin (Hb A1C) -     Dapagliflozin  Propanediol; Take 1 tablet (10 mg total) by mouth daily before breakfast.  Dispense: 90 tablet; Refill: 1 -     metFORMIN  HCl; Take 1 tablet (1,000 mg total) by mouth 2 (two) times daily with a meal.  Dispense: 180 tablet; Refill: 1 -     Tresiba  FlexTouch; Inject 40 Units into the skin daily. Getting through PAP   Dispense: 36 mL; Refill: 1 -     Sure Comfort Pen Needles; Inject 1 Act into the skin 2 (two) times daily.  Dispense: 100 each; Refill: 1  Need for shingles vaccine -     Shingrix ; Inject 0.5 mLs into the muscle once for 1 dose.  Dispense: 0.5 mL; Refill: 1  Encounter for general adult medical examination with abnormal findings- Exam completed, labs reviewed, vaccines reviewed and updated, cancer screenings are UTD, pt ed material was given.   Gastroesophageal reflux disease with esophagitis without hemorrhage  Visit for screening mammogram -     Digital Screening Mammogram, Left and Right; Future  Hyperlipidemia with target LDL less than 100- LDL goal achieved. Doing well on the statin  -     Lipid panel; Future -     TSH; Future -     Hepatic function panel; Future -     Atorvastatin  Calcium ; Take 1 tablet (20 mg total) by mouth daily.  Dispense: 90 tablet; Refill: 1  Pure hyperglyceridemia -     Lipid panel; Future  HTN (hypertension), benign- Her BP is well controlled. -     Basic metabolic panel with GFR; Future -     CBC with Differential/Platelet; Future -     TSH; Future -     Urinalysis, Routine w reflex microscopic; Future -     Hepatic function panel; Future -     EKG 12-Lead  Morbid obesity (HCC) -     TSH; Future -     Hepatic function panel; Future  Chronic fatigue -     CT CORONARY MORPH W/CTA COR W/SCORE W/CA W/CM &/OR WO/CM; Future  Abnormal electrocardiogram (ECG) (EKG) -     CT CORONARY MORPH W/CTA COR W/SCORE W/CA W/CM &/OR WO/CM; Future  Current moderate episode of major depressive disorder without prior episode (HCC) -  DULoxetine  HCl; Take 1 capsule (30 mg total) by mouth daily.  Dispense: 30 capsule; Refill: 0  Primary osteoarthritis of both knees -     DULoxetine  HCl; Take 1 capsule (30 mg total) by mouth daily.  Dispense: 30 capsule; Refill: 0  Gastroesophageal reflux disease without esophagitis -     Omeprazole ; Take 1 capsule (40 mg total)  by mouth daily.  Dispense: 90 capsule; Refill: 1     Follow-up: Return in about 6 months (around 11/23/2024).  Debby Molt, MD

## 2024-05-23 NOTE — Patient Instructions (Signed)

## 2024-05-24 ENCOUNTER — Ambulatory Visit: Payer: Self-pay | Admitting: Internal Medicine

## 2024-05-26 ENCOUNTER — Telehealth (HOSPITAL_COMMUNITY): Payer: Self-pay | Admitting: *Deleted

## 2024-05-26 ENCOUNTER — Encounter (HOSPITAL_COMMUNITY): Payer: Self-pay

## 2024-05-26 MED ORDER — METOPROLOL TARTRATE 100 MG PO TABS: ORAL_TABLET | ORAL | 0 refills | Status: DC

## 2024-05-26 NOTE — Telephone Encounter (Signed)
 Reaching out to patient to offer assistance regarding upcoming cardiac imaging study; pt verbalizes understanding of appt date/time, parking situation and where to check in, pre-test NPO status and medications ordered, and verified current allergies; name and call back number provided for further questions should they arise  Chantal Requena RN Navigator Cardiac Imaging [facility] [phone] office [phone] cell  Patient to take 100mg  metoprolol  tartrate two hours prior to her cardiac CT san.

## 2024-05-26 NOTE — Telephone Encounter (Signed)
 Attempted to call patient regarding upcoming cardiac CT appointment. Left message on voicemail with name and callback number  Larey Brick RN Navigator Cardiac Imaging [facility] [phone] Office [phone] Cell

## 2024-05-27 ENCOUNTER — Ambulatory Visit (HOSPITAL_COMMUNITY): Admission: RE | Admit: 2024-05-27 | Source: Ambulatory Visit

## 2024-06-02 ENCOUNTER — Telehealth (HOSPITAL_COMMUNITY): Payer: Self-pay | Admitting: Emergency Medicine

## 2024-06-02 NOTE — Telephone Encounter (Signed)
 Attempted to call patient regarding upcoming cardiac CT appointment. Left message on voicemail with name and callback number Rockwell Alexandria RN Navigator Cardiac Imaging [facility] [phone] Office [phone] Cell

## 2024-06-03 ENCOUNTER — Ambulatory Visit (HOSPITAL_COMMUNITY)
Admission: RE | Admit: 2024-06-03 | Discharge: 2024-06-03 | Disposition: A | Discharge status: Home / Self Care | Source: Ambulatory Visit | Attending: Internal Medicine | Admitting: Internal Medicine

## 2024-06-03 VITALS — BP 116/68 | HR 66

## 2024-06-03 DIAGNOSIS — I2510 Atherosclerotic heart disease of native coronary artery without angina pectoris: Secondary | ICD-10-CM | POA: Insufficient documentation

## 2024-06-03 DIAGNOSIS — R9431 Abnormal electrocardiogram [ECG] [EKG]: Secondary | ICD-10-CM | POA: Insufficient documentation

## 2024-06-03 DIAGNOSIS — K449 Diaphragmatic hernia without obstruction or gangrene: Secondary | ICD-10-CM | POA: Insufficient documentation

## 2024-06-03 DIAGNOSIS — R5382 Chronic fatigue, unspecified: Secondary | ICD-10-CM | POA: Insufficient documentation

## 2024-06-03 DIAGNOSIS — R931 Abnormal findings on diagnostic imaging of heart and coronary circulation: Secondary | ICD-10-CM | POA: Diagnosis not present

## 2024-06-03 DIAGNOSIS — I700 Atherosclerosis of aorta: Secondary | ICD-10-CM | POA: Insufficient documentation

## 2024-06-03 MED ORDER — IOHEXOL 350 MG/ML SOLN
100.0000 mL | Freq: Once | INTRAVENOUS | Status: AC | PRN
  Administered 2024-06-03: 100 mL via INTRAVENOUS

## 2024-06-03 MED ORDER — NITROGLYCERIN 0.4 MG SL SUBL
SUBLINGUAL_TABLET | SUBLINGUAL | Status: AC
  Filled 2024-06-03: qty 2

## 2024-06-03 MED ORDER — NITROGLYCERIN 0.4 MG SL SUBL
0.8000 mg | SUBLINGUAL_TABLET | Freq: Once | SUBLINGUAL | Status: AC
  Administered 2024-06-03: 0.8 mg via SUBLINGUAL

## 2024-06-06 ENCOUNTER — Other Ambulatory Visit: Payer: Self-pay | Admitting: Internal Medicine

## 2024-06-06 ENCOUNTER — Ambulatory Visit (HOSPITAL_COMMUNITY)
Admission: RE | Admit: 2024-06-06 | Discharge: 2024-06-06 | Disposition: A | Discharge status: Home / Self Care | Source: Ambulatory Visit | Attending: Cardiology | Admitting: Cardiology

## 2024-06-06 DIAGNOSIS — I2510 Atherosclerotic heart disease of native coronary artery without angina pectoris: Secondary | ICD-10-CM | POA: Insufficient documentation

## 2024-06-06 DIAGNOSIS — R931 Abnormal findings on diagnostic imaging of heart and coronary circulation: Secondary | ICD-10-CM | POA: Insufficient documentation

## 2024-06-06 MED ORDER — ASPIRIN 81 MG PO TBEC: 81.0000 mg | DELAYED_RELEASE_TABLET | Freq: Every day | ORAL | 1 refills | Status: AC

## 2024-06-06 NOTE — Progress Notes (Signed)
 Your coronary calcium  score is high (1067) and you have CAD. Please take a baby aspirin  in addition to the statin and seek emergent treatment for chest pain or shortness of breath. I have ordered a cardiology referral.     Please read this to her

## 2024-06-07 ENCOUNTER — Other Ambulatory Visit (INDEPENDENT_AMBULATORY_CARE_PROVIDER_SITE_OTHER): Admitting: Pharmacist

## 2024-06-07 DIAGNOSIS — E119 Type 2 diabetes mellitus without complications: Secondary | ICD-10-CM

## 2024-06-07 DIAGNOSIS — Z7985 Long-term (current) use of injectable non-insulin antidiabetic drugs: Secondary | ICD-10-CM

## 2024-06-07 DIAGNOSIS — Z794 Long term (current) use of insulin: Secondary | ICD-10-CM

## 2024-06-07 DIAGNOSIS — Z7984 Long term (current) use of oral hypoglycemic drugs: Secondary | ICD-10-CM

## 2024-06-07 NOTE — Patient Instructions (Addendum)
 It was a pleasure speaking with you today!  You can pick up your Ozempic  at the office.  Start taking Aspirin  81mg  daily.  We will follow-up early October.  Feel free to call with any questions or concerns!  Lum Ricks, PharmD Candidate  [employer], [employer]   [name], PharmD, [employer], CPP Clinical Pharmacist Practitioner [facility] [phone]

## 2024-06-07 NOTE — Progress Notes (Signed)
 06/07/24  Name: Abigail Collins MRN: [id number] DOB: Apr 11, 1951  Chief Complaint  Patient presents with   Diabetes   Medication Management    Abigail Collins is a 73 y.o. year old female who presented for a telephone visit.   They were referred to the pharmacist by their PCP for assistance in managing diabetes and medication access.   Subjective:  Care Team: Prima/ry Care Provider: Joshua Debby CROME, MD ; Next Scheduled Visit: 6 months (needs to schedule)  Medication Access/Adherence  Current Pharmacy:  [id number] [place], [place] - [phone] AT [facility] OF [facility] & [address] [place] [address] Phone: [phone] Fax: [phone]    Patient reports affordability concerns with their medications: Yes  Patient reports access/transportation concerns to their pharmacy: No  Patient reports adherence concerns with their medications:  No    Diabetes:  Current medications: Tresiba  40 units daily, Metformin  1000 mg twice daily, Farxiga  10 mg daily, Ozempic  1 mg weekly - A1c improved from 10.9 to 7.5  Recent FBG: 141 on 8/26  - She was previously interested in getting Freestyle Libre 3 Plus CGM once Medicaid started. Today, she states she does not want to use CGM because she does not want alarms going off and waking her up -Discussed Cardiac CT findings with patient.   Received Farxiga , Tresiba  and pen needles from PAP  Objective:  Lab Results  Component Value Date   HGBA1C 7.5 (A) 05/23/2024    Lab Results  Component Value Date   CREATININE 0.92 05/23/2024   BUN 20 05/23/2024   NA 140 05/23/2024   K 4.7 05/23/2024   CL 104 05/23/2024   CO2 27 05/23/2024    Lab Results  Component Value Date   CHOL 110 05/23/2024   HDL 52.50 05/23/2024   LDLCALC 23 05/23/2024   LDLDIRECT 44.0 10/27/2022   TRIG 171.0 (H) 05/23/2024   CHOLHDL 2 05/23/2024    Medications Reviewed Today     Reviewed by Merceda Lela SAUNDERS, RPH (Pharmacist) on 06/07/24 at 1410  Med List Status: <None>   Medication Order Taking? Sig Documenting Provider Last Dose Status Informant  Accu-Chek Softclix Lancets lancets [id number]  Use to check blood glucose twice daily E11.65 T2DM Joshua Debby CROME, MD  Active   aspirin  EC 81 MG tablet [id number]  Take 1 tablet (81 mg total) by mouth daily.  Patient not taking: Reported on 06/07/2024   Joshua Debby CROME, MD  Active   atorvastatin  (LIPITOR) 20 MG tablet [id number] Yes Take 1 tablet (20 mg total) by mouth daily. Joshua Debby CROME, MD  Active   Blood Glucose Monitoring Suppl (ACCU-CHEK GUIDE ME) w/Device KIT [id number]  Use to check blood glucose twice daily E11.65 T2DM Joshua Debby CROME, MD  Active   dapagliflozin  propanediol (FARXIGA ) 10 MG TABS tablet [id number] Yes Take 1 tablet (10 mg total) by mouth daily before breakfast. Joshua Debby CROME, MD  Active   DULoxetine  (CYMBALTA ) 30 MG capsule [id number]  Take 1 capsule (30 mg total) by mouth daily. Joshua Debby CROME, MD  Active   estradiol  (ESTRACE ) 0.1 MG/GM vaginal cream [id number]  Place 0.5g nightly for two weeks then twice a week after [name], MD  Active   Glucagon  (GVOKE HYPOPEN  2-PACK) 1 MG/0.2ML EMMANUEL [id number]  Inject 1 Act into the skin daily as needed. Joshua Debby CROME, MD  Active   glucose blood (ACCU-CHEK GUIDE) test strip [id number]  Use to check blood glucose twice daily E11.65 T2DM Joshua Debby CROME, MD  Active   insulin  degludec (TRESIBA  FLEXTOUCH) 100 UNIT/ML FlexTouch Pen [id number] Yes Inject 40 Units into the skin daily. Getting through PAP Joshua Debby CROME, MD  Active   Insulin  Pen Needle (SURE COMFORT PEN NEEDLES) 32G X 4 MM MISC [id number]  Inject 1 Act into the skin 2 (two) times daily. Joshua Debby CROME, MD  Active   irbesartan  (AVAPRO ) 300 MG tablet [id number]  Take 1 tablet (300 mg total) by mouth daily. Joshua Debby CROME, MD  Active   metFORMIN  (GLUCOPHAGE ) 1000 MG tablet [id number] Yes Take 1 tablet (1,000 mg total) by mouth 2  (two) times daily with a meal. Joshua Debby CROME, MD  Active   metoprolol  tartrate (LOPRESSOR ) 100 MG tablet [id number]  Take tablet (100mg ) TWO hours prior to your cardiac CT scan. Barbaraann Darryle Debby, MD  Active   omeprazole  [facility]) 40 MG capsule [id number]  Take 1 capsule (40 mg total) by mouth daily. Joshua Debby CROME, MD  Active   Semaglutide , 1 MG/DOSE, (OZEMPIC , 1 MG/DOSE,) 2 MG/1.5ML SOPN [id number] Yes Inject into the skin. [provider]  Active   trospium  (SANCTURA ) 20 MG tablet [id number]  Take 1 tablet (20 mg total) by mouth 2 (two) times daily. [name], MD  Active               Assessment/Plan:   Diabetes: - Currently controlled, Goal A1c <8% - Farxiga  and Ozempic  and Tresiba  PAP were approved  - Ozempic  1 mg from PAP is ready for the patient to pick up .   - Continue currently regimen   CAD - reviewed Cardiac CT results with patient, including new diagnosis of CAD and referral to cardiology. Provided phone number to call to schedule cardiology appt. - Advised pt to start aspirin  81mg  daily per PCP response to results. - LDL goal <55, currently at goal on atorvastatin  20 mg  Follow Up Plan: 10/6   Lum Ricks, PharmD Candidate  [facility] [employer]   [name], PharmD, [employer], CPP Clinical Pharmacist Practitioner [facility] [phone]

## 2024-06-08 ENCOUNTER — Telehealth: Payer: Self-pay

## 2024-06-08 NOTE — Telephone Encounter (Signed)
 Patient has been made aware that her Ozempic  medication is in the office and she can pick the medication up at her earliest convenience. She gave a verbal understanding and states that she will pick this up on Friday.

## 2024-07-05 ENCOUNTER — Ambulatory Visit (INDEPENDENT_AMBULATORY_CARE_PROVIDER_SITE_OTHER): Admitting: Dermatology

## 2024-07-05 ENCOUNTER — Encounter: Payer: Self-pay | Admitting: Dermatology

## 2024-07-05 VITALS — BP 159/83

## 2024-07-05 DIAGNOSIS — D0461 Carcinoma in situ of skin of right upper limb, including shoulder: Secondary | ICD-10-CM | POA: Diagnosis not present

## 2024-07-05 DIAGNOSIS — L578 Other skin changes due to chronic exposure to nonionizing radiation: Secondary | ICD-10-CM

## 2024-07-05 DIAGNOSIS — B079 Viral wart, unspecified: Secondary | ICD-10-CM

## 2024-07-05 DIAGNOSIS — D485 Neoplasm of uncertain behavior of skin: Secondary | ICD-10-CM | POA: Diagnosis not present

## 2024-07-05 NOTE — Patient Instructions (Signed)

## 2024-07-05 NOTE — Progress Notes (Unsigned)
 New Patient Visit   Subjective  Abigail Collins is a 73 y.o. female who presents for the following: Spot of left post thigh that has been there for 3-4 months. She thought it was a wart and used OTC wart medicine but it did not help. It is hard and sore when she sits down.  She also has a history of SCC in situ of her right forearm that was treated with EDC by Dr Livingston 11/14/2021. It has started to come back.    The following portions of the chart were reviewed this encounter and updated as appropriate: medications, allergies, medical history  Review of Systems:  No other skin or systemic complaints except as noted in HPI or Assessment and Plan.  Objective  Well appearing patient in no apparent distress; mood and affect are within normal limits.   A focused examination was performed of the following areas: Arms and legs  Relevant exam findings are noted in the Assessment and Plan.  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 Re-biopsy is necessary to confirm recurrence and assess depth for further treatment planning.  - Perform biopsy of the right forearm lesion to confirm recurrence and assess depth. - Refer to Dr. Montel for surgical excision if biopsy confirms squamous cell carcinoma.  Suspected squamous cell carcinoma or keratoacanthoma of left posterior thigh Suspected growth presenting as a 1 cm plaque. Differential diagnosis includes SCC and keratoacanthoma type. - Perform biopsy of the left posterior thigh lesion to confirm diagnosis. - If biopsy confirms skin cancer, refer to Dr. Corey for surgical excision or Mohs surgery.  Skin cancer screening Plan to conduct a comprehensive skin  cancer screening in the next few weeks to ensure no other lesions are present. - Schedule follow-up appointment for a full skin cancer screening.  NEOPLASM OF UNCERTAIN BEHAVIOR OF SKIN (2) Right Forearm - Posterior Skin / nail biopsy Type of biopsy: tangential   Informed consent: discussed and consent obtained   Timeout: patient name, date of birth, surgical site, and procedure verified   Procedure prep:  Patient was prepped and draped in usual sterile fashion Prep type:  Isopropyl alcohol Anesthesia: the lesion was anesthetized in a standard fashion   Anesthetic:  1% lidocaine  w/ epinephrine  1-100,000 buffered w/ 8.4% NaHCO3 Instrument used: flexible razor blade   Hemostasis achieved with: pressure, aluminum chloride and electrodesiccation   Outcome: patient tolerated procedure well   Post-procedure details: sterile dressing applied and wound care instructions given   Dressing type: bandage and petrolatum    Specimen 1 - Surgical pathology Differential Diagnosis: R/O Recurrent SCC [id number] Check Margins: No Left Thigh - Posterior Skin / nail biopsy Type of biopsy: tangential   Informed consent: discussed and consent obtained   Timeout: patient name, date of birth, surgical site, and procedure verified   Procedure prep:  Patient was prepped and draped in usual sterile fashion Prep type:  Isopropyl alcohol Anesthesia: the lesion was anesthetized in a standard fashion   Anesthetic:  1% lidocaine  w/ epinephrine  1-100,000 buffered w/ 8.4% NaHCO3 Instrument used: flexible razor blade   Hemostasis achieved with: pressure, aluminum chloride and electrodesiccation   Outcome: patient tolerated procedure well   Post-procedure details: sterile  dressing applied and wound care instructions given   Dressing type: bandage and petrolatum    Specimen 2 - Surgical pathology Differential Diagnosis: R/O SCC KA type  Check Margins: No ACTINIC SKIN DAMAGE    Return for TBSE (first  available appointment).  I, Roseline Hutchinson, CMA, am acting as scribe for [employer], DO .   Documentation: I have reviewed the above documentation for accuracy and completeness, and I agree with the above.  Delon Lenis, DO

## 2024-07-06 LAB — SURGICAL PATHOLOGY

## 2024-07-07 ENCOUNTER — Encounter: Payer: Self-pay | Admitting: Dermatology

## 2024-07-07 ENCOUNTER — Other Ambulatory Visit: Payer: Self-pay | Admitting: Internal Medicine

## 2024-07-07 ENCOUNTER — Ambulatory Visit: Payer: Self-pay | Admitting: Dermatology

## 2024-07-07 DIAGNOSIS — D099 Carcinoma in situ, unspecified: Secondary | ICD-10-CM | POA: Insufficient documentation

## 2024-07-07 DIAGNOSIS — K219 Gastro-esophageal reflux disease without esophagitis: Secondary | ICD-10-CM

## 2024-07-07 DIAGNOSIS — I10 Essential (primary) hypertension: Secondary | ICD-10-CM

## 2024-07-07 DIAGNOSIS — E785 Hyperlipidemia, unspecified: Secondary | ICD-10-CM

## 2024-07-07 NOTE — Progress Notes (Signed)
 HI Shirron, Please call pt and notify their bx results were positive for a skin CA that will be treated by Dr. Alm w/ [facility]   1. Skin, right forearm - posterior : ED&C w/ Dr Alm SCHOOLING CELL CARCINOMA IN SITU, BASE INVOLVED        2. Skin, left thigh - posterior :  Benign      VERRUCA VULGARIS, IRRITATED

## 2024-07-15 ENCOUNTER — Encounter: Payer: Self-pay | Admitting: Pharmacist

## 2024-07-18 ENCOUNTER — Telehealth: Payer: Self-pay

## 2024-07-18 ENCOUNTER — Other Ambulatory Visit (INDEPENDENT_AMBULATORY_CARE_PROVIDER_SITE_OTHER): Admitting: Pharmacist

## 2024-07-18 DIAGNOSIS — M170 Bilateral primary osteoarthritis of knee: Secondary | ICD-10-CM

## 2024-07-18 DIAGNOSIS — E119 Type 2 diabetes mellitus without complications: Secondary | ICD-10-CM

## 2024-07-18 DIAGNOSIS — F321 Major depressive disorder, single episode, moderate: Secondary | ICD-10-CM

## 2024-07-18 MED ORDER — DULOXETINE HCL 30 MG PO CPEP: 30.0000 mg | ORAL_CAPSULE | Freq: Every day | ORAL | 1 refills | Status: AC

## 2024-07-18 MED ORDER — METFORMIN HCL 1000 MG PO TABS: 1000.0000 mg | ORAL_TABLET | Freq: Two times a day (BID) | ORAL | 1 refills | Status: AC

## 2024-07-18 NOTE — Telephone Encounter (Signed)
 Advised patient that her medication has arrived to the office and she can pick it up. She stated that she would be in the office this week to pick it up. Patient medication has been placed in the fridge.

## 2024-07-18 NOTE — Progress Notes (Signed)
 07/18/24  Name: Abigail Collins MRN: [id number] DOB: 11/29/1950  Chief Complaint  Patient presents with   Diabetes   Medication Management    Abigail Collins is a 73 y.o. year old female who presented for a telephone visit.   They were referred to the pharmacist by their PCP for assistance in managing diabetes and medication access.   Subjective:  Care Team: Prima/ry Care Provider: Joshua Debby CROME, MD ; Next Scheduled Visit: 6 months (needs to schedule)  Medication Access/Adherence  Current Pharmacy:  [id number] [address], [place] - [phone] AT [facility] & [address] [place] [address] Phone: [phone] Fax: [phone]    Patient reports affordability concerns with their medications: Yes  Patient reports access/transportation concerns to their pharmacy: No  Patient reports adherence concerns with their medications:  No    Has not heard from cardio regarding scheduling referral  Diabetes:  Current medications: Tresiba  40 units daily, Metformin  1000 mg twice daily, Farxiga  10 mg daily, Ozempic  1 mg weekly - A1c improved from 10.9 to 7.5   Received Farxiga , Tresiba  and pen needles from PAP  Objective:  Lab Results  Component Value Date   HGBA1C 7.5 (A) 05/23/2024    Lab Results  Component Value Date   CREATININE 0.92 05/23/2024   BUN 20 05/23/2024   NA 140 05/23/2024   K 4.7 05/23/2024   CL 104 05/23/2024   CO2 27 05/23/2024    Lab Results  Component Value Date   CHOL 110 05/23/2024   HDL 52.50 05/23/2024   LDLCALC 23 05/23/2024   LDLDIRECT 44.0 10/27/2022   TRIG 171.0 (H) 05/23/2024   CHOLHDL 2 05/23/2024    Medications Reviewed Today     Reviewed by Merceda Lela SAUNDERS, RPH (Pharmacist) on 07/18/24 at 1631  Med List Status: <None>   Medication Order Taking? Sig Documenting Provider Last Dose Status Informant  Accu-Chek Softclix Lancets lancets [id number]  Use to check blood  glucose twice daily E11.65 T2DM Joshua Debby CROME, MD  Active   aspirin  EC 81 MG tablet [id number]  Take 1 tablet (81 mg total) by mouth daily.  Patient not taking: Reported on 06/07/2024   Joshua Debby CROME, MD  Active   atorvastatin  (LIPITOR) 20 MG tablet [id number]  TAKE 1 TABLET EVERY DAY Joshua Debby CROME, MD  Active   Blood Glucose Monitoring Suppl (ACCU-CHEK GUIDE ME) w/Device KIT [id number]  Use to check blood glucose twice daily E11.65 T2DM Joshua Debby CROME, MD  Active   dapagliflozin  propanediol (FARXIGA ) 10 MG TABS tablet [id number]  Take 1 tablet (10 mg total) by mouth daily before breakfast. Joshua Debby CROME, MD  Active   DULoxetine  (CYMBALTA ) 30 MG capsule [id number]  Take 1 capsule (30 mg total) by mouth daily. Joshua Debby CROME, MD  Active   estradiol  (ESTRACE ) 0.1 MG/GM vaginal cream [id number]  Place 0.5g nightly for two weeks then twice a week after [name], MD  Active   Glucagon  (GVOKE HYPOPEN  2-PACK) 1 MG/0.2ML EMMANUEL [id number]  Inject 1 Act into the skin daily as needed. Joshua Debby CROME, MD  Active   glucose blood (ACCU-CHEK GUIDE) test strip [id number]  Use to check blood glucose twice daily E11.65 T2DM Joshua Debby CROME, MD  Active   insulin  degludec (TRESIBA  FLEXTOUCH) 100 UNIT/ML FlexTouch Pen [id number] Yes Inject 40 Units into the skin daily. Getting through PAP Joshua Debby CROME, MD  Active  Insulin  Pen Needle (SURE COMFORT PEN NEEDLES) 32G X 4 MM MISC [id number]  Inject 1 Act into the skin 2 (two) times daily. Joshua Debby CROME, MD  Active   irbesartan  (AVAPRO ) 300 MG tablet [id number]  TAKE 1 TABLET EVERY DAY Joshua Debby CROME, MD  Active   metFORMIN  (GLUCOPHAGE ) 1000 MG tablet [id number] Yes Take 1 tablet (1,000 mg total) by mouth 2 (two) times daily with a meal. Joshua Debby CROME, MD  Active   metoprolol  tartrate (LOPRESSOR ) 100 MG tablet [id number]  Take tablet (100mg ) TWO hours prior to your cardiac CT scan. Barbaraann Darryle Debby, MD  Active   omeprazole  (PRILOSEC) 40 MG capsule [id number]  TAKE  1 CAPSULE EVERY DAY Joshua Debby CROME, MD  Active   Semaglutide , 1 MG/DOSE, (OZEMPIC , 1 MG/DOSE,) 2 MG/1.5ML SOPN [id number] Yes Inject into the skin. [provider]  Active   trospium  (SANCTURA ) 20 MG tablet [id number]  Take 1 tablet (20 mg total) by mouth 2 (two) times daily. [name], MD  Active               Assessment/Plan:   Diabetes: - Currently controlled, Goal A1c <8% - Farxiga  and Ozempic  and Tresiba  PAP were approved  - Continue currently regimen - Discussed NovoNodisk PAP announcement that Ozempic  will no longer be provided for Medicare patients through PAP. Pt does not qualify for ExtraHelp. Encouraged patient to call [employer] to discuss insurance plan options for next year taking into account medication cost.   CAD - Gave patient phone number to call cardio office to schedule referral - LDL goal <55, currently at goal on atorvastatin  20 mg  Follow Up Plan: PRN   [name], PharmD, BCPS, CPP Clinical Pharmacist Practitioner [facility] [phone]

## 2024-07-18 NOTE — Patient Instructions (Signed)
 It was a pleasure speaking with you today!  Continue current regimen. Contact [employer] to discuss insurance plan options for next year.  Feel free to call with any questions or concerns!  [name], PharmD, BCPS, CPP Clinical Pharmacist Practitioner [facility] at [facility] [phone]

## 2024-07-20 ENCOUNTER — Telehealth: Payer: Self-pay

## 2024-07-20 NOTE — Telephone Encounter (Signed)
 Copied from CRM [id number]. Topic: General - Other >> Jul 20, 2024 12:21 PM Franky GRADE wrote: Reason for CRM: Patient would like to know beside the flu vaccine, if she is due for any other vaccines.

## 2024-07-20 NOTE — Telephone Encounter (Signed)
 Please advise. Shingles, Flu and COVID what I see.

## 2024-07-22 ENCOUNTER — Ambulatory Visit

## 2024-07-22 NOTE — Telephone Encounter (Signed)
 Patient has been made aware.

## 2024-07-26 DIAGNOSIS — H2513 Age-related nuclear cataract, bilateral: Secondary | ICD-10-CM | POA: Diagnosis not present

## 2024-07-26 DIAGNOSIS — H524 Presbyopia: Secondary | ICD-10-CM | POA: Diagnosis not present

## 2024-07-26 LAB — OPHTHALMOLOGY REPORT-SCANNED

## 2024-07-27 ENCOUNTER — Encounter: Payer: Self-pay | Admitting: Internal Medicine

## 2024-08-02 ENCOUNTER — Encounter: Payer: Self-pay | Admitting: Pharmacist

## 2024-08-02 NOTE — Progress Notes (Signed)
 Pharmacy Quality Measure Review  This patient is appearing on a report for being at risk of failing the adherence measure for hypertension (ACEi/ARB) medications this calendar year.   Medication: Irbesartan   Last fill date: 07/07/24 for 90 day supply  Insurance report was not up to date. No action needed at this time.   [name], PharmD, BCPS, CPP Clinical Pharmacist Practitioner [place] [facility] at [facility] [phone]

## 2024-08-05 ENCOUNTER — Telehealth: Payer: Self-pay

## 2024-08-05 NOTE — Telephone Encounter (Signed)
 Gave pt a call spoke with pt ,pt will call [employer] to give consent to 2026 re-enrollment pt will call back if she needs to do application. Pt has not decide if she will keep the same Ins. But will call Ins to find out if Ozempic  is cover under there plan.

## 2024-08-05 NOTE — Telephone Encounter (Signed)
 Received a call from pt explain she call [employer] has been approved for re-enrollment for 2026 on Farxiga .

## 2024-08-09 DIAGNOSIS — H2513 Age-related nuclear cataract, bilateral: Secondary | ICD-10-CM | POA: Diagnosis not present

## 2024-08-15 ENCOUNTER — Ambulatory Visit: Admitting: Obstetrics

## 2024-08-31 DIAGNOSIS — H25812 Combined forms of age-related cataract, left eye: Secondary | ICD-10-CM | POA: Diagnosis not present

## 2024-08-31 DIAGNOSIS — H2512 Age-related nuclear cataract, left eye: Secondary | ICD-10-CM | POA: Diagnosis not present

## 2024-08-31 HISTORY — PX: EYE SURGERY: SHX253

## 2024-09-16 ENCOUNTER — Telehealth: Payer: Self-pay

## 2024-09-16 NOTE — Telephone Encounter (Signed)
 Copied from CRM [id number]. Topic: Clinical - Medication Question >> Sep 16, 2024  1:26 PM Harlene ORN wrote: Reason for CRM: Patient called about a Pharmacist letter about approval for a medication. Please call back the patient to discuss.

## 2024-09-16 NOTE — Telephone Encounter (Signed)
 I have discussed with the patient that this approval from [facility] and [place] is for her farxiga  Per the pharmacist note.

## 2024-09-29 ENCOUNTER — Telehealth: Payer: Self-pay

## 2024-09-29 NOTE — Telephone Encounter (Signed)
 I was able to speak with the pt and inform her that her Tresiba  is in office and ready for pick up. Pt has stated she will be in on 09/30/24 to pick up her meds.

## 2024-09-29 NOTE — Telephone Encounter (Signed)
 Gave pt a call pt is coming up due for re-enrollment on Novo Nordisk (Tresiba ),spoke with pt gave consent to do pap online, faxed provider portion.

## 2024-10-04 NOTE — Telephone Encounter (Signed)
 Faxing provider portion [employer] re-enrollment (Tresiba ).

## 2024-10-11 NOTE — Telephone Encounter (Signed)
 Received provider portion [employer] (Tresiba ) back from provide office,faxed to [employer] today.

## 2024-10-14 ENCOUNTER — Ambulatory Visit: Payer: Self-pay

## 2024-10-14 NOTE — Telephone Encounter (Signed)
 Received approval letter from [employer] (Trsiba/pen needels) thru 10/12/2025,approval letter index.

## 2024-10-14 NOTE — Telephone Encounter (Signed)
" °  FYI Only or Action Required?: FYI only for provider: appointment scheduled on 10/17/24.  Patient was last seen in primary care on 05/23/2024 by Joshua Debby CROME, MD.  Called Nurse Triage reporting Arm Pain.  Symptoms began several months ago.  Interventions attempted: OTC medications: ibuprofen.  Symptoms are: stable.  Triage Disposition: See Physician Within 24 Hours  Patient/caregiver understands and will follow disposition?: Yes   Copied from CRM #[id number]. Topic: Clinical - Red Word Triage >> Oct 14, 2024 10:38 AM Adelita BRAVO wrote: [facility] that prompted transfer to Nurse Triage: Patient had eye surgery on 11/19 where they placed an IV in her right arm. She is now having pain in that wrist/arm and the vein is swollen. Reason for Disposition  [1] MODERATE pain (e.g., interferes with normal activities) AND [2] high-risk adult (e.g., age > 60 years, osteoporosis, chronic steroid use)  [1] MODERATE pain (e.g., interferes with normal activities) AND [2] present > 3 days  Answer Assessment - Initial Assessment Questions 1. ONSET: When did the pain start?     Novemeber after having an IV placed 2. LOCATION: Where is the pain located?     Rt forearm down to hand 3. PAIN: How bad is the pain? (Scale 0-10; or none, mild, moderate, severe)     Moderate, unable to pick up things normally 4. WORK OR EXERCISE: Has there been any recent work or exercise that involved this part of the body?     no 5. CAUSE: What do you think is causing the arm pain?     Previous IV 6. OTHER SYMPTOMS: Do you have any other symptoms? (e.g., neck pain, swelling, rash, fever, numbness, weakness)     Vein is large, no redness/ swelling of surrounding area  Protocols used: Arm Injury-A-AH, Arm Pain-A-AH  "

## 2024-10-17 ENCOUNTER — Telehealth: Payer: Self-pay | Admitting: Internal Medicine

## 2024-10-17 ENCOUNTER — Ambulatory Visit: Admitting: Family Medicine

## 2024-10-17 ENCOUNTER — Encounter: Payer: Self-pay | Admitting: Family Medicine

## 2024-10-17 VITALS — BP 112/70 | HR 90 | Temp 97.7°F | Resp 18 | Ht 60.65 in | Wt 211.0 lb

## 2024-10-17 DIAGNOSIS — M654 Radial styloid tenosynovitis [de Quervain]: Secondary | ICD-10-CM

## 2024-10-17 MED ORDER — DICLOFENAC SODIUM 1 % EX GEL: 2.0000 g | Freq: Four times a day (QID) | CUTANEOUS | 0 refills | Status: AC

## 2024-10-17 NOTE — Telephone Encounter (Signed)
 Patient calling to follow up on patient assistance paperwork. She would like a call back from [place] at [phone].

## 2024-10-18 NOTE — Telephone Encounter (Signed)
 Gave pt a call ,provider office send a msg pt needing imf on pap.spoke with pt explain she received an Novo Nordisk application in the mail ask if she need it to filled out and send it back ,explain pt has already been approved she does not need to send it in.

## 2024-10-22 ENCOUNTER — Encounter: Payer: Self-pay | Admitting: Family Medicine

## 2024-10-22 NOTE — Progress Notes (Signed)
 "  Assessment & Plan Tendinitis, de Quervain's Pain and tenderness in the right thumb and wrist likely due to nerve irritation from IV placement. Differential diagnosis includes carpal tunnel syndrome, but swelling and location are inconsistent. - Prescribed Voltaren  gel for topical application. - Referred to Sports Medicine for evaluation and possible tendon injection. Orders:   Ambulatory referral to Sports Medicine   diclofenac  Sodium (VOLTAREN ) 1 % GEL; Apply 2 g topically 4 (four) times daily.   Follow up plan: Return if symptoms worsen or fail to improve.  Niki Rung, MSN, APRN, FNP-C  Subjective:  HPI: Abigail Collins is a 74 y.o. female presenting on 10/17/2024 for right lower arm pain (Recent cataract surgery (08/31/24)  w/ IV to right wrist area - pain since, hard to lift items/Would like a wrist brace )  Discussed the use of AI scribe software for clinical note transcription with the patient, who gave verbal consent to proceed.  She has significant tenderness and swelling at the site of a recent IV insertion on her right hand, placed during her cataract surgery. The pain radiates from the IV site down to her thumb, making it difficult to perform daily activities such as picking up pots and pans or turning on the faucet. She describes the pain as severe, stating 'I can't even pick pots and pans up.'  She has been using a wrist brace borrowed from a friend, which provides some relief, but it has become stretched and less effective. She has also been taking ibuprofen, but it has not alleviated her symptoms. She has not experienced any falls or trauma to the area.        ROS: Negative unless specifically indicated above in HPI.   Relevant past medical history reviewed and updated as indicated.   Allergies and medications reviewed and updated.  Current Medications[1]  Allergies[2]  Objective:   BP 112/70   Pulse 90   Temp 97.7 F (36.5 C)   Resp 18   Ht 5' 0.65  (1.541 m)   Wt 211 lb (95.7 kg)   SpO2 96%   BMI 40.33 kg/m    Physical Exam Vitals reviewed.  Constitutional:      General: She is not in acute distress.    Appearance: Normal appearance. She is not ill-appearing, toxic-appearing or diaphoretic.  HENT:     Head: Normocephalic and atraumatic.  Eyes:     General: No scleral icterus.       Right eye: No discharge.        Left eye: No discharge.     Conjunctiva/sclera: Conjunctivae normal.  Cardiovascular:     Rate and Rhythm: Normal rate.  Pulmonary:     Effort: Pulmonary effort is normal. No respiratory distress.  Musculoskeletal:        General: Normal range of motion.     Right wrist: Tenderness and snuff box tenderness present. No swelling, deformity, effusion, lacerations or bony tenderness. Normal range of motion. Normal pulse.       Arms:     Cervical back: Normal range of motion.     Comments: Site of tenderness  Skin:    General: Skin is warm and dry.     Capillary Refill: Capillary refill takes less than 2 seconds.  Neurological:     General: No focal deficit present.     Mental Status: She is alert and oriented to person, place, and time. Mental status is at baseline.  Psychiatric:  Mood and Affect: Mood normal.        Behavior: Behavior normal.        Thought Content: Thought content normal.        Judgment: Judgment normal.            [1]  Current Outpatient Medications:    Accu-Chek Softclix Lancets lancets, Use to check blood glucose twice daily E11.65 T2DM, Disp: 200 each, Rfl: 11   atorvastatin  (LIPITOR) 20 MG tablet, TAKE 1 TABLET EVERY DAY, Disp: 90 tablet, Rfl: 3   Blood Glucose Monitoring Suppl (ACCU-CHEK GUIDE ME) w/Device KIT, Use to check blood glucose twice daily E11.65 T2DM, Disp: 1 kit, Rfl: 0   dapagliflozin  propanediol (FARXIGA ) 10 MG TABS tablet, Take 1 tablet (10 mg total) by mouth daily before breakfast., Disp: 90 tablet, Rfl: 1   diclofenac  Sodium (VOLTAREN ) 1 % GEL, Apply 2  g topically 4 (four) times daily., Disp: 150 g, Rfl: 0   Glucagon  (GVOKE HYPOPEN  2-PACK) 1 MG/0.2ML SOAJ, Inject 1 Act into the skin daily as needed., Disp: 2 mL, Rfl: 5   glucose blood (ACCU-CHEK GUIDE) test strip, Use to check blood glucose twice daily E11.65 T2DM, Disp: 200 each, Rfl: 11   insulin  degludec (TRESIBA  FLEXTOUCH) 100 UNIT/ML FlexTouch Pen, Inject 40 Units into the skin daily. Getting through PAP, Disp: 36 mL, Rfl: 1   Insulin  Pen Needle (SURE COMFORT PEN NEEDLES) 32G X 4 MM MISC, Inject 1 Act into the skin 2 (two) times daily., Disp: 100 each, Rfl: 1   irbesartan  (AVAPRO ) 300 MG tablet, TAKE 1 TABLET EVERY DAY, Disp: 90 tablet, Rfl: 3   metFORMIN  (GLUCOPHAGE ) 1000 MG tablet, Take 1 tablet (1,000 mg total) by mouth 2 (two) times daily with a meal., Disp: 180 tablet, Rfl: 1   omeprazole  (PRILOSEC) 40 MG capsule, TAKE 1 CAPSULE EVERY DAY, Disp: 90 capsule, Rfl: 3   Semaglutide , 1 MG/DOSE, (OZEMPIC , 1 MG/DOSE,) 2 MG/1.5ML SOPN, Inject into the skin., Disp: , Rfl:    aspirin  EC 81 MG tablet, Take 1 tablet (81 mg total) by mouth daily. (Patient not taking: Reported on 10/17/2024), Disp: 90 tablet, Rfl: 1   DULoxetine  (CYMBALTA ) 30 MG capsule, Take 1 capsule (30 mg total) by mouth daily. (Patient not taking: Reported on 10/17/2024), Disp: 90 capsule, Rfl: 1   estradiol  (ESTRACE ) 0.1 MG/GM vaginal cream, Place 0.5g nightly for two weeks then twice a week after, Disp: 30 g, Rfl: 3   trospium  (SANCTURA ) 20 MG tablet, Take 1 tablet (20 mg total) by mouth 2 (two) times daily. (Patient not taking: Reported on 10/17/2024), Disp: 60 tablet, Rfl: 0 [2]  Allergies Allergen Reactions   Lisinopril Shortness Of Breath   Amlodipine  Swelling    UNSPECIFIED EDEMA   Tape Other (See Comments)    Tore skin--?hypofix   Morphine  And Codeine Nausea And Vomiting   Percocet [Oxycodone -Acetaminophen ] Nausea And Vomiting   "

## 2024-10-24 ENCOUNTER — Ambulatory Visit: Admitting: Family Medicine

## 2024-10-25 ENCOUNTER — Other Ambulatory Visit (HOSPITAL_COMMUNITY): Payer: Self-pay

## 2024-10-25 ENCOUNTER — Telehealth: Payer: Self-pay | Admitting: Pharmacy Technician

## 2024-10-25 NOTE — Progress Notes (Signed)
" ° °  10/25/2024  Patient ID: Abigail Collins, female   DOB: 1951/07/11, 75 y.o.   MRN: [id number]  Pharmacy Quality Measure Review  This patient is appearing on a report for being at risk of failing the adherence measure for diabetes medications this calendar year.   Medication: Metformin  1000mg  Last fill date: 10/22/2024 for 90 day supply. Previous fills include 07/30/2024 for 90 day supply and 05/23/2024 for 90 day supply.  This patient is appearing on a report for being at risk of failing the adherence measure for hypertension (ACEi/ARB) medications this calendar year.   Medication: Irbesartan  300mg  Last fill date: 09/29/2024 for 90 day supply.Previous fills include 07/07/2024 for 90 day supply and 03/05/2024 for 90 day supply.   Call placed to patient. HIPAA verified. Patient informs she takes Metformin  twice a day. She reports no side effects or cost issues. Based on fill history, she is filling on time.  She also informs taking Irbesartan  daily as directed. She reports no side effects or cost issues.  Patient does inform she is not sure she will be able to afford Ozempic  this year. Outreached advocate team [facility], CPhT)  for a test claim of Oezmpic. Patient informs she only has Med AB but per advocate team she has Med D (but when asked by patient if she has insurance that helps with medication she said no). Test claim results are below. Will send to clinic PharmD to discuss options with patient such as Trulicity  with [employer] if deemed therapeutically appropriate. Patient was advised PharmD may be giving her a call. The number at [facility] is the best way to outreach patient.  Abigail Collins, CPhT [facility] Office: [phone] Email: [email or web address]  "

## 2024-10-31 ENCOUNTER — Telehealth: Payer: Self-pay | Admitting: Pharmacist

## 2024-10-31 DIAGNOSIS — E119 Type 2 diabetes mellitus without complications: Secondary | ICD-10-CM

## 2024-10-31 MED ORDER — TRULICITY 0.75 MG/0.5ML ~~LOC~~ SOAJ: SUBCUTANEOUS | Status: AC

## 2024-10-31 NOTE — Progress Notes (Signed)
 "  10/31/24  Name: Abigail Collins MRN: [id number] DOB: 07/02/1951  Chief Complaint  Patient presents with   Diabetes   Medication Assistance    Abigail Collins is a 74 y.o. year old female who presented for a telephone visit.   They were referred to the pharmacist by their PCP for assistance in managing diabetes and medication access.   Subjective:  Care Team: Prima/ry Care Provider: Joshua Debby CROME, MD ; Next Scheduled Visit: 6 months (needs to schedule)  Medication Access/Adherence  Contacted patient due to pt unable to afford Ozempic  since PAP was discontinued for Medicare pts. She has not had Ozempic  since December.   Diabetes:  Current medications: Tresiba  40 units daily, Metformin  1000 mg twice daily, Farxiga  10 mg daily Previous medications: Ozempic  1 mg weekly - A1c improved from 10.9 to 7.5 w/ Ozempic   Receives Farxiga , Tresiba  and pen needles from PAP  Objective:  Lab Results  Component Value Date   HGBA1C 7.5 (A) 05/23/2024    Lab Results  Component Value Date   CREATININE 0.92 05/23/2024   BUN 20 05/23/2024   NA 140 05/23/2024   K 4.7 05/23/2024   CL 104 05/23/2024   CO2 27 05/23/2024    Lab Results  Component Value Date   CHOL 110 05/23/2024   HDL 52.50 05/23/2024   LDLCALC 23 05/23/2024   LDLDIRECT 44.0 10/27/2022   TRIG 171.0 (H) 05/23/2024   CHOLHDL 2 05/23/2024    Medications Reviewed Today     Reviewed by Abigail Collins, RPH-CPP (Pharmacist) on 10/31/24 at 1359  Med List Status: <None>   Medication Order Taking? Sig Documenting Provider Last Dose Status Informant  Accu-Chek Softclix Lancets lancets [id number]  Use to check blood glucose twice daily E11.65 T2DM Abigail Debby CROME, MD  Active   aspirin  EC 81 MG tablet [id number]  Take 1 tablet (81 mg total) by mouth daily.  Patient not taking: Reported on 10/17/2024   Abigail Debby CROME, MD  Active   atorvastatin  (LIPITOR) 20 MG tablet [id number]  TAKE 1 TABLET EVERY DAY Abigail Debby CROME,  MD  Active   Blood Glucose Monitoring Suppl (ACCU-CHEK GUIDE ME) w/Device KIT [id number]  Use to check blood glucose twice daily E11.65 T2DM Abigail Debby CROME, MD  Active   dapagliflozin  propanediol (FARXIGA ) 10 MG TABS tablet [id number] Yes Take 1 tablet (10 mg total) by mouth daily before breakfast. Abigail Debby CROME, MD  Active   diclofenac  Sodium (VOLTAREN ) 1 % GEL [id number]  Apply 2 g topically 4 (four) times daily. Abigail Eland F, FNP  Active   DULoxetine  (CYMBALTA ) 30 MG capsule [id number]  Take 1 capsule (30 mg total) by mouth daily.  Patient not taking: Reported on 10/17/2024   Abigail Debby CROME, MD  Active   estradiol  (ESTRACE ) 0.1 MG/GM vaginal cream [id number]  Place 0.5g nightly for two weeks then twice a week after Abigail Lianne DASEN, MD  Active   Glucagon  (GVOKE HYPOPEN  2-PACK) 1 MG/0.2ML Abigail Collins [id number]  Inject 1 Act into the skin daily as needed. Abigail Debby CROME, MD  Active   glucose blood (ACCU-CHEK GUIDE) test strip [id number]  Use to check blood glucose twice daily E11.65 T2DM Abigail Debby CROME, MD  Active   insulin  degludec (TRESIBA  FLEXTOUCH) 100 UNIT/ML FlexTouch Pen [id number] Yes Inject 40 Units into the skin daily. Getting through PAP Abigail Debby CROME, MD  Active   Insulin  Pen Needle (SURE COMFORT PEN NEEDLES) 32G X 4 MM MISC [id number]  Inject  1 Act into the skin 2 (two) times daily. Abigail Debby CROME, MD  Active   irbesartan  (AVAPRO ) 300 MG tablet [id number]  TAKE 1 TABLET EVERY DAY Abigail Debby CROME, MD  Active   metFORMIN  (GLUCOPHAGE ) 1000 MG tablet [id number] Yes Take 1 tablet (1,000 mg total) by mouth 2 (two) times daily with a meal. Abigail Debby CROME, MD  Active   omeprazole  (PRILOSEC) 40 MG capsule [id number]  TAKE 1 CAPSULE EVERY DAY Abigail Debby CROME, MD  Active   Semaglutide , 1 MG/DOSE, (OZEMPIC , 1 MG/DOSE,) 2 MG/1.5ML SOPN [id number]  Inject into the skin.  Patient not taking: Reported on 10/31/2024   [provider]  Active   trospium  (SANCTURA ) 20 MG tablet [id number]  Take 1 tablet (20  mg total) by mouth 2 (two) times daily.  Patient not taking: Reported on 10/17/2024   Abigail Dull T, MD  Active               Assessment/Plan:   Diabetes: - Currently controlled, Goal A1c <8% - Farxiga  and Tresiba  through PAP - already approved - Will switch Ozempic  to Trulicity , application filled out and left at front desk for patient to sign    Follow Up Plan: Collins/u PAP   Abigail Collins, PharmD, BCPS, CPP Clinical Pharmacist Practitioner [facility] [phone]    "

## 2024-11-02 ENCOUNTER — Telehealth: Payer: Self-pay

## 2024-11-02 NOTE — Telephone Encounter (Signed)
 Patient has been made aware that her Tresiba  has mae it to the office and she can come pick it up. She gave a verbal understanding and stated that she will be by today to pick up her medication.

## 2024-11-09 ENCOUNTER — Other Ambulatory Visit: Payer: Self-pay

## 2024-11-09 ENCOUNTER — Telehealth: Payer: Self-pay

## 2024-11-09 DIAGNOSIS — E119 Type 2 diabetes mellitus without complications: Secondary | ICD-10-CM

## 2024-11-09 MED ORDER — DAPAGLIFLOZIN PROPANEDIOL 10 MG PO TABS: 10.0000 mg | ORAL_TABLET | Freq: Every day | ORAL | 0 refills | Status: AC

## 2024-11-09 NOTE — Telephone Encounter (Signed)
 Copied from CRM #[id number]. Topic: Clinical - Medication Refill >> Nov 09, 2024 11:41 AM Alfonso ORN wrote: Medication: dapagliflozin  propanediol (FARXIGA ) 10 MG  Has the patient contacted their pharmacy? Yes  This is the patient's preferred pharmacy:   [facility] - [place], [place] - [address] [place] [address] Phone: [phone] Fax: [phone]   Is this the correct pharmacy for this prescription? Yes If no, delete pharmacy and type the correct one.   Has the prescription been filled recently? No  Is the patient out of the medication? No  Has the patient been seen for an appointment in the last year OR does the patient have an upcoming appointment? Yes  Can we respond through MyChart? no  Agent: Please be advised that Rx refills may take up to 3 business days. We ask that you follow-up with your pharmacy.

## 2024-11-09 NOTE — Telephone Encounter (Signed)
 Medication refill request sent to Dr. Yetta Barre

## 2024-12-12 ENCOUNTER — Ambulatory Visit: Admitting: Dermatology

## 2025-02-09 ENCOUNTER — Ambulatory Visit
